# Patient Record
Sex: Female | Born: 1940
Health system: Southern US, Community
[De-identification: ages and names within clinical notes are randomized; demographics above are authoritative.]

## PROBLEM LIST (undated history)

## (undated) DIAGNOSIS — I4892 Unspecified atrial flutter: Secondary | ICD-10-CM

## (undated) DIAGNOSIS — I10 Essential (primary) hypertension: Secondary | ICD-10-CM

## (undated) DIAGNOSIS — I509 Heart failure, unspecified: Secondary | ICD-10-CM

## (undated) DIAGNOSIS — E785 Hyperlipidemia, unspecified: Secondary | ICD-10-CM

## (undated) DIAGNOSIS — I2699 Other pulmonary embolism without acute cor pulmonale: Secondary | ICD-10-CM

## (undated) DIAGNOSIS — I5020 Unspecified systolic (congestive) heart failure: Secondary | ICD-10-CM

## (undated) DIAGNOSIS — I2510 Atherosclerotic heart disease of native coronary artery without angina pectoris: Secondary | ICD-10-CM

## (undated) DIAGNOSIS — I219 Acute myocardial infarction, unspecified: Secondary | ICD-10-CM

## (undated) DIAGNOSIS — N183 Chronic kidney disease, stage 3 unspecified: Secondary | ICD-10-CM

## (undated) DIAGNOSIS — I350 Nonrheumatic aortic (valve) stenosis: Secondary | ICD-10-CM

## (undated) DIAGNOSIS — E119 Type 2 diabetes mellitus without complications: Secondary | ICD-10-CM

## (undated) DIAGNOSIS — I5040 Unspecified combined systolic (congestive) and diastolic (congestive) heart failure: Secondary | ICD-10-CM

## (undated) DIAGNOSIS — M1990 Unspecified osteoarthritis, unspecified site: Secondary | ICD-10-CM

## (undated) DIAGNOSIS — I779 Disorder of arteries and arterioles, unspecified: Secondary | ICD-10-CM

## (undated) DIAGNOSIS — I639 Cerebral infarction, unspecified: Secondary | ICD-10-CM

## (undated) DIAGNOSIS — R413 Other amnesia: Secondary | ICD-10-CM

## (undated) DIAGNOSIS — F0150 Vascular dementia without behavioral disturbance: Secondary | ICD-10-CM

## (undated) DIAGNOSIS — I255 Ischemic cardiomyopathy: Secondary | ICD-10-CM

## (undated) HISTORY — DX: Other amnesia: R41.3

## (undated) HISTORY — DX: Essential (primary) hypertension: I10

## (undated) HISTORY — PX: HIP SURGERY: SHX245

## (undated) HISTORY — DX: Other pulmonary embolism without acute cor pulmonale: I26.99

## (undated) HISTORY — DX: Atherosclerotic heart disease of native coronary artery without angina pectoris: I25.10

## (undated) HISTORY — DX: Unspecified atrial flutter: I48.92

## (undated) HISTORY — PX: CHOLECYSTECTOMY: SHX55

## (undated) HISTORY — DX: Type 2 diabetes mellitus without complications: E11.9

## (undated) HISTORY — DX: Hyperlipidemia, unspecified: E78.5

## (undated) HISTORY — DX: Ischemic cardiomyopathy: I25.5

## (undated) HISTORY — DX: Unspecified combined systolic (congestive) and diastolic (congestive) heart failure: I50.40

---

## 2001-08-16 ENCOUNTER — Emergency Department (HOSPITAL_COMMUNITY): Admission: EM | Admit: 2001-08-16 | Discharge: 2001-08-16 | Payer: Self-pay | Admitting: Emergency Medicine

## 2001-08-16 ENCOUNTER — Encounter: Payer: Self-pay | Admitting: Emergency Medicine

## 2001-10-21 ENCOUNTER — Encounter: Payer: Self-pay | Admitting: General Surgery

## 2001-10-21 ENCOUNTER — Encounter: Payer: Self-pay | Admitting: *Deleted

## 2001-10-22 ENCOUNTER — Inpatient Hospital Stay (HOSPITAL_COMMUNITY): Admission: EM | Admit: 2001-10-22 | Discharge: 2001-10-24 | Payer: Self-pay | Admitting: *Deleted

## 2005-05-20 ENCOUNTER — Inpatient Hospital Stay (HOSPITAL_COMMUNITY): Admission: AD | Admit: 2005-05-20 | Discharge: 2005-05-27 | Payer: Self-pay | Admitting: Interventional Cardiology

## 2005-05-20 ENCOUNTER — Encounter: Payer: Self-pay | Admitting: *Deleted

## 2005-05-22 ENCOUNTER — Encounter (INDEPENDENT_AMBULATORY_CARE_PROVIDER_SITE_OTHER): Payer: Self-pay | Admitting: Cardiology

## 2005-06-18 ENCOUNTER — Encounter (HOSPITAL_COMMUNITY): Admission: RE | Admit: 2005-06-18 | Discharge: 2005-07-12 | Payer: Self-pay | Admitting: Interventional Cardiology

## 2005-07-15 ENCOUNTER — Encounter (HOSPITAL_COMMUNITY): Admission: RE | Admit: 2005-07-15 | Discharge: 2005-08-14 | Payer: Self-pay | Admitting: Interventional Cardiology

## 2005-08-16 ENCOUNTER — Encounter (HOSPITAL_COMMUNITY): Admission: RE | Admit: 2005-08-16 | Discharge: 2005-09-15 | Payer: Self-pay | Admitting: Interventional Cardiology

## 2005-09-16 ENCOUNTER — Encounter (HOSPITAL_COMMUNITY): Admission: RE | Admit: 2005-09-16 | Discharge: 2005-09-25 | Payer: Self-pay | Admitting: Interventional Cardiology

## 2005-11-08 ENCOUNTER — Ambulatory Visit (HOSPITAL_COMMUNITY): Admission: RE | Admit: 2005-11-08 | Discharge: 2005-11-08 | Payer: Self-pay | Admitting: Pulmonary Disease

## 2008-02-18 ENCOUNTER — Encounter: Admission: RE | Admit: 2008-02-18 | Discharge: 2008-02-18 | Payer: Self-pay | Admitting: Interventional Cardiology

## 2008-02-19 ENCOUNTER — Ambulatory Visit (HOSPITAL_COMMUNITY): Admission: RE | Admit: 2008-02-19 | Discharge: 2008-02-19 | Payer: Self-pay | Admitting: Pulmonary Disease

## 2008-02-23 ENCOUNTER — Inpatient Hospital Stay (HOSPITAL_BASED_OUTPATIENT_CLINIC_OR_DEPARTMENT_OTHER): Admission: RE | Admit: 2008-02-23 | Discharge: 2008-02-23 | Payer: Self-pay | Admitting: Interventional Cardiology

## 2008-02-29 ENCOUNTER — Inpatient Hospital Stay (HOSPITAL_COMMUNITY): Admission: RE | Admit: 2008-02-29 | Discharge: 2008-03-01 | Payer: Self-pay | Admitting: Interventional Cardiology

## 2008-03-17 ENCOUNTER — Encounter (HOSPITAL_COMMUNITY): Admission: RE | Admit: 2008-03-17 | Discharge: 2008-04-16 | Payer: Self-pay | Admitting: Interventional Cardiology

## 2008-04-18 ENCOUNTER — Encounter (HOSPITAL_COMMUNITY): Admission: RE | Admit: 2008-04-18 | Discharge: 2008-05-18 | Payer: Self-pay | Admitting: Interventional Cardiology

## 2008-05-20 ENCOUNTER — Encounter (HOSPITAL_COMMUNITY): Admission: RE | Admit: 2008-05-20 | Discharge: 2008-06-19 | Payer: Self-pay | Admitting: Interventional Cardiology

## 2008-06-22 ENCOUNTER — Encounter (HOSPITAL_COMMUNITY): Admission: RE | Admit: 2008-06-22 | Discharge: 2008-07-11 | Payer: Self-pay | Admitting: Interventional Cardiology

## 2008-08-13 ENCOUNTER — Inpatient Hospital Stay (HOSPITAL_COMMUNITY): Admission: EM | Admit: 2008-08-13 | Discharge: 2008-08-27 | Payer: Self-pay | Admitting: Emergency Medicine

## 2008-08-14 DIAGNOSIS — I2699 Other pulmonary embolism without acute cor pulmonale: Secondary | ICD-10-CM

## 2008-08-14 HISTORY — DX: Other pulmonary embolism without acute cor pulmonale: I26.99

## 2008-08-15 ENCOUNTER — Ambulatory Visit: Payer: Self-pay | Admitting: Gastroenterology

## 2008-08-16 ENCOUNTER — Encounter: Payer: Self-pay | Admitting: Gastroenterology

## 2008-08-16 ENCOUNTER — Ambulatory Visit: Payer: Self-pay | Admitting: Gastroenterology

## 2008-08-17 ENCOUNTER — Ambulatory Visit: Payer: Self-pay | Admitting: Gastroenterology

## 2008-08-18 ENCOUNTER — Ambulatory Visit: Payer: Self-pay | Admitting: Internal Medicine

## 2008-08-22 ENCOUNTER — Ambulatory Visit: Payer: Self-pay | Admitting: Internal Medicine

## 2008-09-27 ENCOUNTER — Ambulatory Visit: Payer: Self-pay | Admitting: Gastroenterology

## 2009-03-01 ENCOUNTER — Ambulatory Visit: Payer: Self-pay | Admitting: Orthopedic Surgery

## 2009-03-01 DIAGNOSIS — M1610 Unilateral primary osteoarthritis, unspecified hip: Secondary | ICD-10-CM | POA: Insufficient documentation

## 2009-03-01 DIAGNOSIS — M169 Osteoarthritis of hip, unspecified: Secondary | ICD-10-CM | POA: Insufficient documentation

## 2009-03-01 DIAGNOSIS — M25559 Pain in unspecified hip: Secondary | ICD-10-CM | POA: Insufficient documentation

## 2010-04-09 ENCOUNTER — Emergency Department (HOSPITAL_COMMUNITY): Admission: EM | Admit: 2010-04-09 | Discharge: 2010-04-09 | Payer: Self-pay | Admitting: Emergency Medicine

## 2010-04-12 ENCOUNTER — Ambulatory Visit: Payer: Self-pay | Admitting: Orthopedic Surgery

## 2010-04-12 DIAGNOSIS — S42213A Unspecified displaced fracture of surgical neck of unspecified humerus, initial encounter for closed fracture: Secondary | ICD-10-CM | POA: Insufficient documentation

## 2010-04-26 ENCOUNTER — Ambulatory Visit: Payer: Self-pay | Admitting: Orthopedic Surgery

## 2010-04-30 ENCOUNTER — Encounter (HOSPITAL_COMMUNITY): Admission: RE | Admit: 2010-04-30 | Discharge: 2010-05-30 | Payer: Self-pay | Admitting: Orthopedic Surgery

## 2010-05-28 ENCOUNTER — Ambulatory Visit: Payer: Self-pay | Admitting: Orthopedic Surgery

## 2010-05-29 ENCOUNTER — Telehealth: Payer: Self-pay | Admitting: Orthopedic Surgery

## 2010-06-01 ENCOUNTER — Encounter (HOSPITAL_COMMUNITY): Admission: RE | Admit: 2010-06-01 | Discharge: 2010-07-01 | Payer: Self-pay | Admitting: Orthopedic Surgery

## 2010-06-27 ENCOUNTER — Encounter: Payer: Self-pay | Admitting: Orthopedic Surgery

## 2010-06-28 ENCOUNTER — Ambulatory Visit: Payer: Self-pay | Admitting: Orthopedic Surgery

## 2010-07-02 ENCOUNTER — Encounter (HOSPITAL_COMMUNITY): Admission: RE | Admit: 2010-07-02 | Discharge: 2010-07-13 | Payer: Self-pay | Admitting: Orthopedic Surgery

## 2010-07-06 ENCOUNTER — Encounter: Payer: Self-pay | Admitting: Orthopedic Surgery

## 2010-07-16 ENCOUNTER — Encounter (HOSPITAL_COMMUNITY)
Admission: RE | Admit: 2010-07-16 | Discharge: 2010-08-15 | Payer: Self-pay | Source: Home / Self Care | Admitting: Orthopedic Surgery

## 2010-07-26 ENCOUNTER — Encounter: Payer: Self-pay | Admitting: Orthopedic Surgery

## 2010-08-01 ENCOUNTER — Ambulatory Visit: Payer: Self-pay | Admitting: Orthopedic Surgery

## 2010-08-13 ENCOUNTER — Ambulatory Visit (HOSPITAL_COMMUNITY): Admission: RE | Admit: 2010-08-13 | Discharge: 2010-08-13 | Payer: Self-pay | Admitting: Pulmonary Disease

## 2010-08-17 ENCOUNTER — Encounter (HOSPITAL_COMMUNITY)
Admission: RE | Admit: 2010-08-17 | Discharge: 2010-09-16 | Payer: Self-pay | Source: Home / Self Care | Admitting: Orthopedic Surgery

## 2010-08-21 ENCOUNTER — Encounter: Payer: Self-pay | Admitting: Orthopedic Surgery

## 2010-09-12 ENCOUNTER — Ambulatory Visit: Payer: Self-pay | Admitting: Orthopedic Surgery

## 2010-11-13 NOTE — Assessment & Plan Note (Signed)
Summary: 2 WK XR RT SHOULDER/MEDICARE/BSF   Visit Type:  Follow-up Referring Provider:  sp er Primary Provider:  Dr. Luan Pulling  CC:  RECHECK RT SHOULDER.  History of Present Illness: I saw [name] in the office today for an initial visit.  She is a 70 years old woman with the complaint of:  right shoulder fracture.  DOI 04/09/10 fell.  Xrays [facility] 04/09/10 rt shoulder.  Meds: Enalapril, Metoprolol, HCTZ, Amlodipine, Plavix, Lipitor, Glucatrol Actoplus.  Percocet 5 and Reglan 10 given from er, no pain med needed now.  TODAY IS 2 WEEK RECHECK XRAY RIGHT SHOULDER AFTER SLING.  She feels better, in sling today with cane left hand.      Allergies: No Known Drug Allergies  Physical Exam  Additional Exam:  The arm has less swelling   her hand has diminished ROM   the wrist is normal and the thumb is normal       Impression & Recommendations:  Problem # 1:  CLOSED FRACTURE OF SURGICAL NECK OF HUMERUS (ICD-812.01)  AP and lateral RIGHT shoulder x-ray show 1 cm or less translation on the AP view no angulation or rotatory deformity  Lateral looks perfect  Recommend continue sling start physical therapy return 4 weeks for x-rays  Orders: Physical Therapy Referral (PT) Post-Op Check [id number]) Shoulder x-ray,  minimum 2 views [id number])  Patient Instructions: 1)  xrays look good, shoulder is healing 2)  stay in sling 3)  start rehab at [facility] for shoulder elbow wrist and hand 4)  come back in 4 weeks recheck and xray right shoulder fracture

## 2010-11-13 NOTE — Miscellaneous (Signed)
Summary: OT progress note  OT progress note   Imported By: Ruffin Pyo 07/26/2010 15:44:46  _____________________________________________________________________  External Attachment:    Type:   Image     Comment:   External Document

## 2010-11-13 NOTE — Letter (Signed)
Summary: History form  History form   Imported By: Ruffin Pyo 03/09/2009 14:59:33  _____________________________________________________________________  External Attachment:    Type:   Image     Comment:   External Document

## 2010-11-13 NOTE — Assessment & Plan Note (Signed)
Summary: RT HIP PAIN/NEEDS XRAY/HAD HIP SURG DR H 2000/MEDICARE,UN AM/CAF   Vital Signs:  Patient profile:   70 year old female Weight:      208 pounds Pulse (ortho):   70 / minute Resp:     16 per minute  Visit Type:  Initial    CC:  right hip pain.  History of Present Illness: The patient had a fracture of the RIGHT hip and had open treatment internal fixation for peritrochanteric fracture.  At the time she was noted to have arthritis of the hip.  She was told this may progress and get worse.  Over the last week she had increased pressure and pain in the RIGHT hip which has since resolved but she thought she better get it checked out anyway.  There is pain with ambulation.  No radiation.  Pain is primarily over the lateral hip.  Current Meds: Glucotrol Vasotec, Lopressor, Amlodipine, diuretic, cholesterol med.     Allergies (verified): No Known Drug Allergies  Past History:  Past Medical History:    htn    diabetes    cholesterol  Past Surgical History:    right hip pain    gallbladder    hernia repair    stent for heart  Family History:    Family History of Diabetes    Family History Coronary Heart Disease female < 36    Family History of Arthritis  Social History:    Patient is married.     retired  Review of Systems Cardiac :  Complains of heart attack and blood clots. Endo:  Complains of diabetes.  Physical Exam  Additional Exam:  She has what we would probably endomorphic body habitus, no other deformity, she does have a RIGHT shorter than LEFT leg length discrepancy mild.  She ambulates with a mild limp and waddling gait.  She is normal findings for cardiovascular function in both lower extremities, skin is warm dry and intact incision is well-healed.  Reflexes are normal sensation is normal.  She's awake alert and oriented x3 mood and affect are normal.  Musculoskeletal exam inspection gait as stated  No tenderness in either hip.  Range  of motion decreased internal rotation both hips.  Pain with internal rotation RIGHT hip.  Internal rotation only about 10 bilaterally.  External rotation about 45.  Motor exam both lower extremities normal.  Stability both hips normal.     Impression & Recommendations:  Problem # 1:  HIP PAIN (ICD-719.45) Assessment Comment Only  Orders: New Patient Level III [id number]) Pelvis x-ray complete, minimum 3 views [id number]) Hip x-ray unilateral complete, minimum 2 views [id number]) a long plate and screw construct with cerclage wires noted RIGHT hip and femur.  Also notable is a obliterated RIGHT hip joint.  Impression: Status post treatment internal fixation RIGHT hip with long plate and screw for a pertrochanteric fracture with good healing and alignment of the fracture but osteoarthritis of the RIGHT hip severe.  Problem # 2:  HIP, ARTHRITIS, DEGEN./OSTEO (ICD-715.95) We counseled the patient on the following she does have arthritis of the hip.  This will be accompanied proceeded to take this down and replaced the joint.  She'll need a long stem prosthesis.  Therefore since her pain is manageable we will avoid surgery but followup once a year for x-rays  Patient Instructions: 1)  Please schedule a follow-up appointment in 1 year. 2)  xray 1/year

## 2010-11-13 NOTE — Progress Notes (Signed)
Summary: patieng question about driving  Phone Note Call from Patient   Caller: Patient Summary of Call: Patient called, as said forgot to check yesterday, about when it is okay to drive?  Home ph # is [phone].  Her next appt is 06/28/10. Initial call taken by: Ihor Austin,  May 29, 2010 2:44 PM  Follow-up for Phone Call        YES SHORT DISTANCES  Follow-up by: Arther Abbott MD,  May 29, 2010 2:44 PM  Additional Follow-up for Phone Call Additional follow up Details #1::        Advised patient. Additional Follow-up by: Ihor Austin,  May 29, 2010 3:24 PM

## 2010-11-13 NOTE — Miscellaneous (Signed)
Summary: OT clinical evaluation  OT clinical evaluation   Imported By: Ruffin Pyo 07/09/2010 08:50:59  _____________________________________________________________________  External Attachment:    Type:   Image     Comment:   External Document

## 2010-11-13 NOTE — Assessment & Plan Note (Signed)
Summary: 6 WK RECK AFTER PT/MCR/BSF   Visit Type:  Follow-up Referring Omarrion Carmer:  sp er Primary Brently Voorhis:  Dr. Luan Pulling  CC:  recheck shoulder.  History of Present Illness: 70 year old female proximal humerus fracture June of this year treated with physical therapy which she has recently been released.  She's currently on a home exercise program with some occasional achiness in the biceps tendon area.  She is doing well.  Meds: No pain med needed. Enalapril, Metoprolol, HCTZ, Amlodipine, Plavix, Lipitor, Glucatrol Actoplus.  she has forward  elevation of 140 with external rotation of approximately 45  She has been released.       Allergies: No Known Drug Allergies   Impression & Recommendations:  Problem # 1:  CLOSED FRACTURE OF SURGICAL NECK OF HUMERUS (ICD-812.01) Assessment Improved  Orders: Est. Patient Level II [id number])  Patient Instructions: 1)  Please schedule a follow-up appointment as needed.   Orders Added: 1)  Est. Patient Level II [id number]

## 2010-11-13 NOTE — Assessment & Plan Note (Signed)
Summary: 1 M RE-CK RT SHOULDER/MCR/CAF   Visit Type:  Follow-up Referring Jasai Sorg:  sp er Primary Kimberlea Schlag:  Dr. Luan Pulling  CC:  right shoulder fracture.  History of Present Illness: I saw [name] in the office today for a 1 month followup visit.  She is a 70 years old woman with the complaint of:  right shoulder fracture.  Proximal humeral fracture.  DOI 04/09/10 fell.  Xrays [facility] 04/09/10 rt shoulder.  Meds: PERCOCET 5MG  Enalapril, Metoprolol, HCTZ, Amlodipine, Plavix, Lipitor, Glucatrol Actoplus.  She is still in therapy. Things are going well.  She has passive range of motion of 100 of forward elevation with active 120 she can internally rotate actively to her back pocket and has 50 of passive and active external rotation with her arm at the side  She should continue with her physical therapy and all see her in 6 weeks  Allergies: No Known Drug Allergies   Medications Added to Medication List This Visit: 1)  Percocet 5-325 Mg Tabs (Oxycodone-acetaminophen) .Marland Kitchen.. 1 q 4prn  Other Orders: Post-Op Check [id number])  Patient Instructions: 1)  6 WEEKS RETURN  2)  FINISH pt  Prescriptions: PERCOCET 5-325 MG TABS (OXYCODONE-ACETAMINOPHEN) 1 Q 4PRN  #30 x 0   Entered and Authorized by:   Arther Abbott MD   Signed by:   Arther Abbott MD on 08/01/2010   Method used:   Print then Give to Patient   RxID:   [id number]    Orders Added: 1)  Post-Op Check [id number]

## 2010-11-13 NOTE — Miscellaneous (Signed)
Summary: OT Progress note  OT Progress note   Imported By: Ruffin Pyo 05/30/2010 08:10:50  _____________________________________________________________________  External Attachment:    Type:   Image     Comment:   External Document

## 2010-11-13 NOTE — Miscellaneous (Signed)
Summary: OT Progress Note  OT Progress Note   Imported By: Ruffin Pyo 06/27/2010 10:04:11  _____________________________________________________________________  External Attachment:    Type:   Image     Comment:   External Document

## 2010-11-13 NOTE — Miscellaneous (Signed)
Summary: OT progress note  OT progress note   Imported By: Ruffin Pyo 08/21/2010 17:01:09  _____________________________________________________________________  External Attachment:    Type:   Image     Comment:   External Document

## 2010-11-13 NOTE — Assessment & Plan Note (Signed)
Summary: 4 WK RE-CK/XRAY RT SHOULDER/MEDICARE/CAF   Visit Type:  Follow-up Referring Provider:  sp er Primary Provider:  Dr. Luan Pulling  CC:  right shoulder fracture.  History of Present Illness: I saw [name] in the office today for a 4 week  followup visit.  She is a 70 years old woman with the complaint of:  right shoulder.  Xrays today for a recheck.  right shoulder fracture.  DOI 04/09/10 fell.  Xrays [facility] 04/09/10 rt shoulder.  Meds: Enalapril, Metoprolol, HCTZ, Amlodipine, Plavix, Lipitor, Glucatrol Actoplus.  X-rays today show the RIGHT shoulder 2 views show that the fracture is healing nicely in good position  Patient physical therapy notes indicate progressive increasing motion  Recommendation continue physical therapy return in one month check range of motion    Allergies: No Known Drug Allergies   Impression & Recommendations:  Problem # 1:  CLOSED FRACTURE OF SURGICAL NECK OF HUMERUS (ICD-812.01) Assessment Improved  AP lateral RIGHT shoulder fracture proximal humerus nondisplaced good callus forming  Orders: Post-Op Check [id number]) Shoulder x-ray,  minimum 2 views [id number])  Patient Instructions: 1)  Please schedule a follow-up appointment in 1 month.

## 2010-11-13 NOTE — Assessment & Plan Note (Signed)
Summary: 1 MO RECK RT SHOULDER/MCR/BSF   Visit Type:  Follow-up Referring Provider:  sp er Primary Provider:  Dr. Luan Pulling  CC:  recheck right shoulder.  History of Present Illness: proximal humerus fracture RIGHT shoulder  70 year old female now 10 weeks post injury status post physical therapy/ proceeding well in physical therapy DOI 04/09/10 fell.  Xrays [facility] 04/09/10 rt shoulder.  Meds: Enalapril, Metoprolol, HCTZ, Amlodipine, Plavix, Lipitor, Glucatrol Actoplus.  she is not requiring any pain medicine at this time she still has a lot of stiffness and is worried about her internal rotation  Her forward elevation is approximately 80, abduction 70, internal rotation to the hip pocket and external rotation 45 no pain at the fracture site  She has normal neurovascular function  Physical therapy notes indicate improving range of motion  Recommend continue range of motion followup one month    Allergies: No Known Drug Allergies   Impression & Recommendations:  Problem # 1:  CLOSED FRACTURE OF SURGICAL NECK OF HUMERUS (ICD-812.01)  Orders: Post-Op Check [id number])  Patient Instructions: 1)  Please schedule a follow-up appointment in 1 month.

## 2010-11-13 NOTE — Letter (Signed)
Summary: History form  History form   Imported By: Ruffin Pyo 04/20/2010 07:54:47  _____________________________________________________________________  External Attachment:    Type:   Image     Comment:   External Document

## 2010-11-13 NOTE — Assessment & Plan Note (Signed)
Summary: rt shoulder fx/[facility] xrays/per dr Elvis Coil   Visit Type:  new problem Referring Provider:  sp er Primary Provider:  Dr. Luan Pulling  CC:  right arm pain.  History of Present Illness: I saw [name] in the office today for an initial visit.  She is a 70 years old woman with the complaint of:  right shoulder fracture.  DOI 04/09/10 fell.  Xrays [facility] 04/09/10 rt shoulder.  Meds: Enalapril, Metoprolol, HCTZ, Amlodipine, Plavix, Lipitor, Glucatrol Actoplus.  Percocet 5 and Reglan 10 given from er.  In sling today.   c/o fairly severe, aching, throbbing non radiating right shoulder pain    Allergies (verified): No Known Drug Allergies  Past History:  Past Medical History: Last updated: 03/01/2009 htn diabetes cholesterol  Family History: Last updated: 03/01/2009 Family History of Diabetes Family History Coronary Heart Disease female < 61 Family History of Arthritis  Social History: Last updated: 04/12/2010 Patient is married.  retired no smoking no alcohol  1 x day caffeine 14 yr ed.  Past Surgical History: right hip  gallbladder hernia repair stent for heart  Family History: Reviewed history from 03/01/2009 and no changes required. Family History of Diabetes Family History Coronary Heart Disease female < 55 Family History of Arthritis  Social History: Reviewed history from 03/01/2009 and no changes required. Patient is married.  retired no smoking no alcohol  1 x day caffeine 14 yr ed.  Review of Systems Neurologic:  Complains of numbness; denies tingling, unsteady gait, dizziness, tremors, and seizure. Musculoskeletal:  Complains of swelling; denies joint pain, instability, stiffness, redness, heat, and muscle pain.  The review of systems is negative for Constitutional, Cardiovascular, Respiratory, Gastrointestinal, Genitourinary, Endocrine, Psychiatric, Skin, HEENT, Immunology, and Hemoatologic.  Physical Exam  Additional Exam:  GEN:  normal   CDV: normal pulse radial ulnar right hand and wrist, temp normal    LYMPH: normal non palpable cervical lymph nodes   SKIN: normal with subQ ecchymosis    Neuro: normal axillary nerve sensation, she is awake and alert , mood pleasant    MSK: ambulates with a cane and a slight limp   RT shoulder in sling  tender over the proximal deltoid swollen; normal ROM in the hand and wrist  strength weak grip  wrist stable    Impression & Recommendations:  Problem # 1:  CLOSED FRACTURE OF SURGICAL NECK OF HUMERUS (ICD-812.01)  The x-rays were done at [facility]. The report and the films have been reviewed. lateral view not readable ! repeated: non displaced prox hum fracture   REC SH IMMOBILIZER   Orders: Est. Patient Level IV [id number]) Humeral Neck Fx [id number]) Shoulder x-ray,  minimum 2 views [id number])  Patient Instructions: 1)  Please schedule a follow-up appointment in 2 weeks. 2)  X-RAYS SHOULDER

## 2011-02-26 NOTE — Group Therapy Note (Signed)
[name], [name]              ACCOUNT NO.:  [id number]   MEDICAL RECORD NO.:  [id number]          PATIENT TYPE:  INP   LOCATION:  A316                          FACILITY:  [facility]   PHYSICIAN:  Edward L. Luan Pulling, M.D.DATE OF BIRTH:  13-May-1941   DATE OF PROCEDURE:  DATE OF DISCHARGE:                                 PROGRESS NOTE   PROBLEMS:  1. Ischemic colitis.  2. Salmonella.  Positive stool for Salmonella.  3. Diabetes.  4. Coronary artery occlusive disease.  5. Hypertension.  6. Hypokalemia.   SUBJECTIVE:  [name] says she is doing better.  She is still having  diarrhea, however.  I am not really certain how long this would be  expected to last under the circumstances, so I asked Dr. Stann Mainland, the GI  physician and see what would be a reasonable expectation for how long  her diarrhea should last.  Otherwise, she is getting along better.  She  says she feels a little better.  She did have potassium replacement.   PHYSICAL EXAMINATION:  CHEST:  Clear.  ABDOMEN:  Tender mildly.  Bowel sounds are hyperactive.   Her potassium is pending.   ASSESSMENT:  Then she still has diarrhea.  I do not think I can send her  home quite yet.   PLAN:  Then is to continue with the treatments.  Check with Dr. Stann Mainland  and follow.      Edward L. Luan Pulling, M.D.  Electronically Signed     ELH/MEDQ  D:  08/18/2008  T:  08/18/2008  Job:  [id number]

## 2011-02-26 NOTE — Group Therapy Note (Signed)
[name], [name]              ACCOUNT NO.:  [id number]   MEDICAL RECORD NO.:  [id number]          PATIENT TYPE:  INP   LOCATION:  A316                          FACILITY:  [facility]   PHYSICIAN:  Edward L. Luan Pulling, M.D.DATE OF BIRTH:  1941/02/26   DATE OF PROCEDURE:  08/19/2008  DATE OF DISCHARGE:                                 PROGRESS NOTE   HISTORY:  [name] has continued to have diarrhea.  She had a very  loose and quite explosive bowel movement yesterday and had soiled her  clothing, the floor, and the socks.  She is still very uncomfortable.  Otherwise, she is about the same.  She remains with a low-grade fever,  99.4 yesterday, 99 now; pulse is 91; respirations 18; blood pressure  137/74; and O2 sats 93% on room air.  Her chest is clear.  Her abdomen  is actually fairly soft.  I have discussed her situation with the GI  team.  She has been evaluated by the Health Department because of her  Salmonella.  It is not at all clear where the source of her Salmonella  is from.  Her blood sugars remain pretty well controlled, and she has  not had chest discomfort.   ASSESSMENT:  She has a complicated situation with probably Salmonella  diarrhea, which then caused dehydration, what appears to be acute renal  failure, then an ischemic bowel, and she continues to have substantial  diarrhea.  We discussed going home.  She said she just does not feel  well enough, and frankly, she does not look well enough either.  She is  diabetic.  Her blood sugars are pretty well controlled.  She has  coronary artery occlusive disease but that is stable.  I am going to  discontinue the monitor.  I am going to continue with her other  treatments, and her potassium is still slightly low.  She is receiving  potassium replacement already.      Edward L. Luan Pulling, M.D.  Electronically Signed     ELH/MEDQ  D:  08/19/2008  T:  08/19/2008  Job:  [id number]

## 2011-02-26 NOTE — Consult Note (Signed)
NAMEXOPHIA, ANGELI              ACCOUNT NO.:  [id number]   MEDICAL RECORD NO.:  [id number]          PATIENT TYPE:  INP   LOCATION:  A316                          FACILITY:  [facility]   PHYSICIAN:  Caro Hight, M.D.      DATE OF BIRTH:  Nov 27, 1940   DATE OF CONSULTATION:  DATE OF DISCHARGE:                                 CONSULTATION   REQUESTING PHYSICIAN:  Dr. Luan Pulling.   REASON FOR CONSULTATION:  Bloody diarrhea.   HPI:  Ms. Brar is a 70 year old Caucasian female who developed acute  onset of febrile illness about 5 days ago.  She tells me her temp was  103.  She had severe myalgias.  She also had nausea but without  vomiting.  The following day she developed diarrhea.  She had about 10  or more loose stools per day.  About 3 days ago, she noticed small to  moderate amounts of bright red blood in the diarrhea with wiping.  She  was started on Tamiflu just prior to coming into the hospital.  She  denies any headache, upper respiratory symptoms, dizziness.  Denies any  heartburn or ingestion.  Her appetite had been good prior to this.  Her  weight had remained stable.  She is on Plavix and aspirin with history  of coronary artery disease and last stent placement in June of 2009.  She has been placed on Flagyl 500 mg t.i.d.  She has C. diff and  lactoferrin pending.   PAST MEDICAL AND SURGICAL HISTORY:  1. Coronary artery disease status post MI and 2 stents placed in June      of 2009.  2. She has never had a colonoscopy.  3. She has non-insulin dependent diabetes mellitus.  4. Hypertension.  5. Right hip surgery in 2000.  6. Status post cholecystectomy 4 years ago.  7. Ventral hernia repair 6 years ago.  8. Campylobacter.   MEDICATIONS PRIOR TO ADMISSION:  Include:  1. Plavix 75 mg daily.  2. Aspirin 325 mg daily.  3. Metoprolol 50 mg b.i.d.  4. Hydrochlorothiazide 25 mg daily.  5. Zocor 40 mg daily.  6. Glipizide 10 mg b.i.d.  7. Enalapril 10 mg b.i.d.  8. Amlodipine  5 mg daily.   ALLERGIES:  NO KNOWN DRUG ALLERGIES.   FAMILY HISTORY:  Positive for father diagnosed in his 55s with colon  cancer.  He died at 50 secondary to coronary disease.  Mother deceased  at 66 secondary to CHF.  She has 1 healthy sister.   SOCIAL HISTORY:  She has a remote history of tobacco use, smoked only  for about 10 years.  Denies any alcohol or drug use.  She has been  married for 45 years.  She has 3 grown children, ages 63, 57, and 65,  all of them are healthy.   REVIEW OF SYSTEMS:  See HPI.  CONSTITUTIONAL:  She does have fatigue.  EXTREMITIES:  She has lower extremity edema.  GU:  She denies any  dysuria, hematuria, or increased urinary frequency.  Otherwise, see HPI,  otherwise negative review of systems.  PHYSICAL EXAM:  VITAL SIGNS:  Temp 98.6.  Pulse 88.  Respirations 18.  Blood pressure 122/61.  GENERAL:  Ms. Zelenak is an obese Caucasian female who is alert and  cooperative, no acute distress.HEENT:  Sclerae are clear, nonicteric.  Conjunctivae pink.  Oropharynx pink and moist without any lesions.  NECK:  Supple without any mass or thyromegaly.  CHEST:  Heart, regular  rate and rhythm.  Normal S1 and S2 without any murmurs, clicks, rubs, or  gallops.  LUNGS:  Clear to auscultation bilaterally.  ABDOMEN:  Protuberant with positive bowel sounds x4.  No bruits auscultated.  Soft, nontender, nondistended, without palpable mass or  hepatosplenomegaly.  No rebound, tenderness, or guarding.  Exam is  limited given patient's body habitus.  EXTREMITIES:  Without clubbing.  She did have trace upper and lower  extremity edema.   LABORATORY STUDIES:  WBC is 8.4, hemoglobin 12.1, hematocrit 35.6, and  platelet 203.  Calcium 8.2, sodium 135, potassium 3.5, chloride 107, CO2  of 21, BUN 27, creatinine 1.57, and glucose 136.  LFTs from August 13, 2008, were normal except albumin 3.  Fecal occult blood was positive.   IMPRESSION:  Ms. Damour is a 31-year Caucasian  female with acute onset  of febrile illness, myalgias, nausea, and diarrhea which later became  bloody diarrhea.  She is going to require further evaluation to rule out  inflammatory bowel disease, colorectal carcinoma, and it is possible  that this could be acute viral or bacterial illness with benign  anorectal bleeding.  She has been empirically treated with Flagyl 500 mg  t.i.d.  She will remain on Plavix given the fact that she has drug-  eluting stent placed June 2009.   PLAN:  Colonoscopy with Dr. Stann Mainland in the near future.  I have discussed  procedure including risks and benefits which include but are not limited  to bleeding,  infection, perforation, or drug reaction.  She agrees and  planned consent will be obtained.  She will be on clear liquids n.p.o.  after midnight except for medications.  MiraLax 17 g in  8 ounces of clear liquids or water every hour until midnight.  Dulcolax  10 mg p.o. now, repeat in 2 hours.  She will have a tap water enema at 7  a.m. and 8 a.m. tomorrow prior to procedure.   Thank you, Dr. Luan Pulling, for allowing [place] to participate in the care of  Ms. Grall.      Vickey Huger, N.P.      Caro Hight, M.D.  Electronically Signed    KJ/MEDQ  D:  08/15/2008  T:  08/15/2008  Job:  [id number]   cc:   Percell Miller L. Luan Pulling, M.D.  Fax: [phone]

## 2011-02-26 NOTE — Group Therapy Note (Signed)
[name], [name]              ACCOUNT NO.:  [id number]   MEDICAL RECORD NO.:  [id number]          PATIENT TYPE:  INP   LOCATION:  A316                          FACILITY:  [facility]   PHYSICIAN:  Edward L. Luan Pulling, M.D.DATE OF BIRTH:  1940-10-17   DATE OF PROCEDURE:  DATE OF DISCHARGE:                                 PROGRESS NOTE   [name] continues on Lovenox and Coumadin.  She said she is feeling  better.  She feels like she has better response for oxygen, is  concerned, she had an O2 sat of 93% on 3 L, she went down to 2 L, she  was 93%.  She is going to try 1 L and will try on room air and see if  she does need oxygen at home.  Her diarrhea has almost stopped.  She is  not having any other new complaints.  She said she is not very short of  breath.  She does not have any pain.   Her exam shows temperature is 99.1, pulse 81, respirations 18, blood  pressure 142/66, and O2 sats as mentioned.  Her chest is pretty clear.  Her heart is regular.   ASSESSMENT:  She has abdominal discomfort.  She has Salmonella in the  stool.  She has had significant diarrhea from that, that is better.  She  has pulmonary embolism.  She is being treated with Lovenox.  We are  going to try to get her home tomorrow and bridge her at home.  She is  going to learn how to give herself shot today.  We are working on [employer].      Edward L. Luan Pulling, M.D.  Electronically Signed     ELH/MEDQ  D:  08/26/2008  T:  08/26/2008  Job:  [id number]

## 2011-02-26 NOTE — Op Note (Signed)
[name], WIGHTMAN              ACCOUNT NO.:  [id number]   MEDICAL RECORD NO.:  [id number]          PATIENT TYPE:  INP   LOCATION:  A316                          FACILITY:  [facility]   PHYSICIAN:  Caro Hight, M.D.      DATE OF BIRTH:  [birth year]   DATE OF PROCEDURE:  08/16/2008  DATE OF DISCHARGE:                               OPERATIVE REPORT   REFERRING PHYSICIAN:  Edward L. Luan Pulling, M.D.   PROCEDURE:  Ileocolonoscopy.   INDICATION FOR EXAM:  Ms. Causer is a 70 year old female who is  currently maintained on aspirin and Plavix.  Her last cardiac  catheterization was May 2009.  She had severe mid right coronary  stenosis with in-stent restenosis and a Cypher stent was placed above  the proximal and distal stent margins. She presented with acute onset of  diarrhea.  She reports having a temperature up to 103.  She saw small  amount of blood.  Her stool cultures thus far are negative.  She has a  C. diff toxin that is pending.  She was placed on Flagyl by Dr. Luan Pulling.  She has not had any abdominal imaging.   FINDINGS:  1. Patchy erythema beginning approximately 10 cm from the anal verge      and extending into the descending colon.  No ulcerations were seen.      No pseudomembranes were seen.  She also had a scant amount of      patchy erythema in the transverse colon.  Biopsies were obtained in      the transverse colon, descending and sigmoid colon, and rectum and      sent in separate containers.  2. Frequent descending colon and sigmoid colon diverticula.  3. No polyps, masses or AVMs seen.  4. Normal retroflexed view of the rectum.   DIAGNOSES:  Colitis likely secondary to ischemia.  The differential  diagnosis includes resolving infectious colitis, or inflammatory bowel  disease. Patient most likely had acute gastroeneteritis and became  hypovolemic whihc lead to renal insufficiency and ischemic colitis.   RECOMMENDATIONS:  1. The patient will need further evaluation  of her abdominal      vasculature.  Specifically, the SMA.  Currently, her creatinine is      1.57 and she is not a candidate for IV contrast. Discuess with her      son and wife.  2. May restart the aspirin on 08/19/08.  3. Would reduce her Lopressor to 25 mg p.o. b.i.d..  4. Will await biopsies.  5. Would complete a 10-day course of Flagyl.  6. Continue Plavix.  Montior for hematochezia.  7. Would use Imodium as needed for diarrhea.  8. Low-fat lactose-free diet.  9. Add Flora-Q p.o. daily.  10.Outpatient visit with Caro Hight, M.D. in 6 weeks.   MEDICATIONS:  1. Demerol 75 mg IV.  2. Versed 7 mg IV.   PROCEDURE TECHNIQUE:  Physical exam was performed.  Informed consent was  obtained from the patient after explaining the benefits, risks and  alternatives to the procedure.  The patient was connected to the monitor  and  placed in the left lateral position.  Continuous oxygen was provided  by nasal cannula, IV medicine administered through an indwelling  cannula.  After administration of sedation and rectal exam, the  patient's rectum was intubated and the scope was advanced under direct  visualization to the cecum.  The patient has a extremely tortuous colon.  The scope was removed slowly by carefully examining the color, texture,  anatomy and integrity of the mucosa on the way out.  The patient was  recovered in endoscopy and discharged to the floor in satisfactory  condition.      Caro Hight, M.D.  Electronically Signed     SM/MEDQ  D:  08/16/2008  T:  08/16/2008  Job:  [id number]   cc:   Percell Miller L. Luan Pulling, M.D.  Fax: [phone]

## 2011-02-26 NOTE — Group Therapy Note (Signed)
NAMEGALIANA, [name]              ACCOUNT NO.:  [id number]   MEDICAL RECORD NO.:  [id number]          PATIENT TYPE:  INP   LOCATION:  A316                          FACILITY:  [facility]   PHYSICIAN:  Edward L. Luan Pulling, M.D.DATE OF BIRTH:  [birth year]   DATE OF PROCEDURE:  DATE OF DISCHARGE:                                 PROGRESS NOTE   [name] was admitted with diarrhea.  She had a colonoscopy  yesterday which showed ischemic colitis.  She continues to have  diarrhea.  She says otherwise she is feeling okay.  She is not having  lot of cramping or any other complaints.   PHYSICAL EXAMINATION:  VITAL SIGNS:  Her temperature is 99.1, pulse 80,  respirations 18, and blood pressure 133/57.  Her blood sugars have been  good and well-controlled.  CHEST:  Clear.  HEART:  Regular.  ABDOMEN:  Soft.   ASSESSMENT:  She has ischemic colitis associated with pretty significant  diarrhea.  This morning, her potassium is 2.6, however replacing that.  Her hemoglobin level was slightly low.  I suspect a combination of  multiple factors.  She has coronary artery occlusive disease, which is  stable.  She has not had any chest pain.  She has diabetes, which is  doing well.  She has hypertension well-controlled, so my plan is to add  potassium and continue with her other medications.  I think we simply  have to wait for her diarrhea to slow.      Edward L. Luan Pulling, M.D.  Electronically Signed     ELH/MEDQ  D:  08/17/2008  T:  08/17/2008  Job:  [id number]

## 2011-02-26 NOTE — Cardiovascular Report (Signed)
[name], [name]              ACCOUNT NO.:  [id number]   MEDICAL RECORD NO.:  [id number]          PATIENT TYPE:  OIB   LOCATION:  1962                         FACILITY:  [facility]   PHYSICIAN:  Belva Crome, M.D.   DATE OF BIRTH:  Nov 11, 1940   DATE OF PROCEDURE:  02/23/2008  DATE OF DISCHARGE:  02/23/2008                            CARDIAC CATHETERIZATION   INDICATIONS:  Progressive angina, dyspnea on exertion in this patient  who is status post previous infarction with PCI on the LAD and right  coronary with DES stenting in the mid right coronary and DES stenting in  the mid-LAD on May 24, 2005.  This study is being done because of a  recent Cardiolite study that demonstrated evidence of inferolateral  ischemia.   PROCEDURE PERFORMED:  1. Left heart cath.  2. Selective coronary angio.  3. Left ventriculography.   DESCRIPTION OF PROCEDURE:  After informed consent, a 4-French sheath was  placed in the right femoral artery using the modified Seldinger  technique.  A 4-French A2 multipurpose catheter was used for hemodynamic  recordings, and right coronary angiography.  We also used 4-French #4  left and right Judkins catheters for left and right coronary  angiography.  The patient tolerated the procedure without complications.   RESULTS:  1. Hemodynamic data:      a.     Aortic pressure 126/54.      b.     Left ventricular pressure 129/21.  2. Left ventriculography:  There is inferior wall mild hypokinesis.      EF is 50%.  No mitral regurgitation.  3. Coronary angiography.      a.     Left main coronary:  Left main is widely patent.      b.     Left anterior descending coronary:  The LAD contains heavy       calcification, contains a moderate-sized first diagonal, there is       segmental 90% stenosis above an ostium of the proximal segment.       The LAD contains 40% stenosis just distal to the first septal       perforator and the first diagonal and then a region of  stented mid       LAD is widely patent.  LAD wraps around the left ventricular apex.      c.     Circumflex artery:  Circumflex coronary artery gives origin       to several obtuse marginal branches.  The bifurcating large obtuse       marginal #1 with 90% stenosis proximal to the bifurcation.  The       second obtuse marginal is free of significant obstruction.  This       vessel is basically a branch of first obtuse margin.  The third       obtuse marginal arises distally after the main body of the       circumflex and contains mid 90% diffuse narrowing ending on a       small PDA branch.      d.  Right coronary artery:  The right coronary artery is a       previously stented vessel, gives origin to a large PDA, and       contains 80% stenosis of the proximal stent margin of the vessel,       but there is type 1 focal stenosis in the distal portion of the       previously placed stent.  The distal RCA contains luminal       irregularities but no high-grade obstructions.   CONCLUSION:  1. The patient has severe mid-right coronary stenosis, type 1A Mehran      in-stent restenosis of previously placed Cypher stent at both      proximal and distal stent margins.  2. Severe native disease involving the circumflex branches as      outlined.  3. Widely patent LAD which was previously stented.  The first diagonal      contains high-grade disease, but it is relatively small.  4. Left ventricular dysfunction.  5. The fusion abnormality on the Cardiolite likely corresponds to the      restenosis of the right coronary artery.   PLAN:  Consider re-PCI of the right coronary.  At this point, I do not  believe that the patient should be a surgical candidate because the LAD  is widely patent and now 3 years out from previous DES stenting without  significant obstruction involving the main channel of the LAD.  The  first obtuse marginal and the third obtuse marginal can likely be  stented.  The  right coronary artery stenosis will be restented.  Speak  with the patient concerning PCI.      Belva Crome, M.D.  Electronically Signed     HWS/MEDQ  D:  02/23/2008  T:  02/24/2008  Job:  [id number]   cc:   Percell Miller L. Luan Pulling, M.D.

## 2011-02-26 NOTE — Group Therapy Note (Signed)
[name], [name]              ACCOUNT NO.:  [id number]   MEDICAL RECORD NO.:  [id number]          PATIENT TYPE:  INP   LOCATION:  A316                          FACILITY:  [facility]   PHYSICIAN:  Edward L. Luan Pulling, M.D.DATE OF BIRTH:  10/19/1940   DATE OF PROCEDURE:  08/27/2008  DATE OF DISCHARGE:  08/27/2008                                 PROGRESS NOTE   [name] is better.  She has no complaints.  She knows how to give  herself Lovenox shots now.  Her diarrhea has essentially stopped, and  she overall is feeling much better.   Her physical examination this morning shows temperature of 98.5, pulse  76, respirations 16, blood pressure 122/62, and O2 sats 96% on 2 L.  Her  chest is clear.  Her heart is regular.  Her abdomen is soft.  She looks  much better.    Her PT is 15.5 with an INR of 1.2.  CBC shows white count 4400, and  hemoglobin is 10.3.   ASSESSMENT:  She has had a Salmonella colitis, improved.  She has had  deep vein thrombosis, being treated.  She has diabetes, which is stable.  She has coronary artery occlusive disease stable.   PLAN:  Discharge home.  Please see discharge summary for details.      Edward L. Luan Pulling, M.D.  Electronically Signed     ELH/MEDQ  D:  08/27/2008  T:  08/27/2008  Job:  [id number]

## 2011-02-26 NOTE — Group Therapy Note (Signed)
[name], [name]              ACCOUNT NO.:  [id number]   MEDICAL RECORD NO.:  [id number]          PATIENT TYPE:  INP   LOCATION:  A316                          FACILITY:  [facility]   PHYSICIAN:  Edward L. Luan Pulling, M.D.DATE OF BIRTH:  Mar 30, 1941   DATE OF PROCEDURE:  DATE OF DISCHARGE:                                 PROGRESS NOTE   PROBLEMS:  Diarrhea with Salmonella.  I have discussed [name]  situation with Dr. Gala Romney.  He has talked to ID specialist in [place]  and I have decided that we should go ahead and treat her for the  Salmonella, which makes sense to me and she may will be a little bit  better.  She has no other new complaints.   PHYSICAL EXAMINATION:  VITALS:  Temperature 100.2, pulse 108,  respirations 20, blood pressure 131/68, and O2 sats 92%.  Her chest is  clear.  Her abdomen is still very soft without tenderness.   ASSESSMENT:  I am very pleased that she is on treatment, hopefully.  She  will continue to improve.  She has had a little bit less diarrhea.      Edward L. Luan Pulling, M.D.  Electronically Signed     ELH/MEDQ  D:  08/20/2008  T:  08/20/2008  Job:  [id number]

## 2011-02-26 NOTE — Group Therapy Note (Signed)
NAMEELLASYN, LIPS              ACCOUNT NO.:  [id number]   MEDICAL RECORD NO.:  [id number]          PATIENT TYPE:  INP   LOCATION:  A316                          FACILITY:  [facility]   PHYSICIAN:  Edward L. Luan Pulling, M.D.DATE OF BIRTH:  30-Jan-1941   DATE OF PROCEDURE:  DATE OF DISCHARGE:                                 PROGRESS NOTE   Ms. Cilia seems to be doing a little bit better.  She continues to  have diarrhea; nevertheless, she has had about 8 stools for the last 24  hours.  She still has low-grade fever as well.   PHYSICAL EXAMINATION:  VITAL SIGNS:  Temperature 99.4 at 1600 yesterday  and 99.3 at 2230 yesterday.  Pulse rate is still somewhat elevated  as  well at 117 and blood pressure 128/68.  CHEST:  Pretty clear.  ABDOMEN:  Soft.   ASSESSMENT:  She has acute Salmonella colitis, and I have discussed the  situation with Dr. Gala Romney again today and he agreed that it is reasonable  to continue with her IV treatments today and then see if she can improve  the diarrhea before we switch her to p.o.  I do not plan any other  treatments.  I am going to recheck her labs in the morning.  As far as  her diabetes is concerned, she is fairly well controlled, her blood  sugar is in the 200s.  Her blood pressure is okay.  As far as her  coronary disease, she has not had any chest pain.      Edward L. Luan Pulling, M.D.  Electronically Signed     ELH/MEDQ  D:  08/22/2008  T:  08/22/2008  Job:  [id number]

## 2011-02-26 NOTE — Group Therapy Note (Signed)
[name], STRADER              ACCOUNT NO.:  [id number]   MEDICAL RECORD NO.:  [id number]          PATIENT TYPE:  INP   LOCATION:  A316                          FACILITY:  [facility]   PHYSICIAN:  Edward L. Luan Pulling, M.D.DATE OF BIRTH:  [birth year]   DATE OF PROCEDURE:  DATE OF DISCHARGE:                                 PROGRESS NOTE   Ms. Kray continues to have diarrhea.  She says she not having lot of  cramping and she feels little bit better, but she is still not  comfortable and still have a lot of diarrhea.   PHYSICAL EXAMINATION:  GENERAL:  She does look like she does not feel  well.  VITAL SIGNS:  Her temperature is 98, pulse 127, respirations 20, and  blood pressure 153/80.  CHEST:  Clear.  HEART:  Regular.  ABDOMEN:  Soft.  Bowel sounds are somewhat hyperactive.  EXTREMITIES:  No edema.  Bowel sounds are somewhat hyperactive.   ASSESSMENT:  She has diarrhea.  We do not have a C. Difficile back yet  on account of being reintubated over 48 hours and now on IV, etc.  I had  started Flagyl yesterday and she has just not had any improvement, so I  think we need to go ahead and get GI involved.  She is tachycardic, so I  am going to start her back on her metoprolol.  She remains on clear  liquids.  I think her hydration to this better.  I do not plan to change  anything else, I am going to continue the Flagyl.  Her blood sugars have  been pretty good so we are going to leave her on her medications for  that.      Edward L. Luan Pulling, M.D.  Electronically Signed     ELH/MEDQ  D:  08/15/2008  T:  08/15/2008  Job:  [id number]

## 2011-02-26 NOTE — Group Therapy Note (Signed)
[name], [name]              ACCOUNT NO.:  [id number]   MEDICAL RECORD NO.:  [id number]          PATIENT TYPE:  INP   LOCATION:  A316                          FACILITY:  [facility]   PHYSICIAN:  Edward L. Luan Pulling, M.D.DATE OF BIRTH:  1941/07/20   DATE OF PROCEDURE:  DATE OF DISCHARGE:                                 PROGRESS NOTE   [name] had developed the low oxygen level yesterday and because of  all that she ended up having a CT angiogram, which was positive for  pulmonary emboli.  She had not been given prophylactic dose of Lovenox  because of her GI bleeding.  She is now on Lovenox and she is going to  stop Coumadin.  Her diarrhea, which had been such a problem, is better.   Assessment then, she has coronary artery occlusive disease, which is  stable.  She has diarrhea with Salmonella.  She now has pulmonary  emboli, so she is going to be on Lovenox and Coumadin.  She is diabetic.  We have discussed the fact that she does have to have an overlap of her  Coumadin and her Lovenox for a period of 5 days, but that does not mean  that she necessarily needs to be in the hospital entire time.   PHYSICAL EXAMINATION:  VITAL SIGNS:  Temp is 99.3, pulse 103,  respirations 20, blood pressure 149/56, and her O2 sats 92% on 2 L.  CHEST:  Clearer.   ASSESSMENT:  She has got multiple problems as above.  In addition, I did  not have her on sequential compression devices, because I was concerned  that it would keep her in the bed and not make her ambulatory.  She was  ambulating well for the last 3 or 4 days.  In fact, we had hoped to get  her home yesterday.      Edward L. Luan Pulling, M.D.  Electronically Signed     ELH/MEDQ  D:  08/24/2008  T:  08/24/2008  Job:  [id number]

## 2011-02-26 NOTE — Discharge Summary (Signed)
[name], [name]              ACCOUNT NO.:  [id number]   MEDICAL RECORD NO.:  [id number]          PATIENT TYPE:  OIB   LOCATION:  [id number]                         FACILITY:  [facility]   PHYSICIAN:  Belva Crome, M.D.   DATE OF BIRTH:  02-20-1941   DATE OF ADMISSION:  02/29/2008  DATE OF DISCHARGE:  03/01/2008                               DISCHARGE SUMMARY   DISCHARGE DIAGNOSES:  1. Coronary artery disease, status post drug-eluting stent to the mid      and distal right coronary artery.  2. Diabetes mellitus.  3. Hypertension.  4. Dyslipidemia.  5. Mild obesity.  6. Long-term medication use.   HOSPITAL COURSE:  [name] is a 71 year old female who had a recent  stress Cardiolite for routine purposes.  She has a history of known  coronary artery disease with a previous stent placement to a LAD in  2006.  Chest Cardiolite revealed no clear ischemia and a fixed distal  anterior wall defect.  It was suggested that she proceed with cardiac  catheterization.  She is found to have 85% mid right coronary artery  stenosis and a 80% distal RCA stenosis.  In these areas, we placed a  PROMUS stents and the patient tolerated this well.  The patient was  admitted in the hospital over night and is ready for discharge to home  in the following day.   DISCHARGE INSTRUCTIONS:  Clean cath site gently with soap and water, no  scrubbing.  Remain on a low sodium, heart-healthy diabetic diet.  Increase activity slowly as per cardiac rehabilitation.  No lifting over  10 pounds for 1 week, no driving for 2 days. Follow up with Dr.  Melchor Amour nurse practitioner on March 15, 2008, at 10:15 a.m.   DISCHARGE MEDICATIONS:  1. Plavix 75 mg a day.  2. Enteric-coated aspirin 325 mg a day.  3. Lopressor 50 mg a day.  4. Hydrochlorothiazide 25 mg a day.  5. Zocor 40 mg nightly.  6. Glipizide 10 mg a day.  7. Norvasc 5 mg a day.  8. Sublingual nitroglycerin p.r.n. chest pain.      Joesphine Bare,  P.A.      Belva Crome, M.D.  Electronically Signed    LB/MEDQ  D:  03/01/2008  T:  03/02/2008  Job:  [id number]

## 2011-02-26 NOTE — Assessment & Plan Note (Signed)
[name], [name]               CHART#:  [id number]   DATE:  09/27/2008                       DOB:  January 14, 1941   PROBLEM LIST:  1. Salmonella colitis.  2. Diabetes.  3. Hypertension.  4. Hyperlipidemia.   SUBJECTIVE:  [name] is a 70 year old female who was initially seen  as an inpatient for bloody diarrhea.  Her stool cultures came back  positive for Salmonella.  She also had colonoscopy in November 2009,  which revealed active colitis.  She was hospitalized for 2 weeks and her  symptoms resolved without antibiotics.  Her bowels are now normal.  She  denies any abdominal cramps.  Her appetite is almost back to normal.  She carries Germ-X in her car and in her purse.   MEDICATIONS:  1. Glipizide 10 mg b.i.d.  2. Plavix 75 mg b.i.d.  3. Metoprolol 50 mg b.i.d.  4. Enalapril 10 mg b.i.d.  5. Amlodipine 5 mg daily.  6. HCTZ 25 mg as needed.  7. Zocor 40 mg daily.   OBJECTIVE:  VITAL SIGNS:  Weight 208 pounds, height 5 feet 5 inches, BMI  34.6 (obese), temperature 97.9, blood pressure 112/70, and pulse 76.  GENERAL:  She is in no apparent distress.  Alert and oriented x4.HEENT:  Atraumatic and normocephalic.  Pupils equal and reactive to light.  Mouth, no oral lesions.  Posterior pharynx without erythema or  exudate.NECK:  Full range motion.  No lymphadenopathy.LUNGS:  Clear to  auscultation bilaterally.CARDIOVASCULAR:  Regular rhythm.  No murmur.  Normal S1 and S2.ABDOMEN:  Bowel sounds are present.  Soft, nontender,  and nondistended.  No rebound or guarding.   ASSESSMENT:  [name] is a 70 year old female who had Salmonella  colitis and her symptoms are now resolved.  She is also on Plavix and  has a history of coronary artery disease and was on aspirin prior to her  hospitalization. Thank you for allowing me to see Ms. Akkerman in  consultation.  My recommendations follow.   RECOMMENDATIONS:  1. She may restart her aspirin.  She needs to add omeprazole for  GI      prophylaxis.  She was given a prescription for 1 p.o. daily and      refill up to a year.  2. Screening colonoscopy in 2019, because her first degree relative      had colon cancer at age greater than 46.  3. She may follow up with me as needed.  4. She should follow a high-fiber diet.  She is given a handout on      high-fiber diet.       Caro Hight, M.D.  Electronically Signed     SM/MEDQ  D:  09/27/2008  T:  09/27/2008  Job:  [id number]   cc:   Percell Miller L. Luan Pulling, M.D.

## 2011-02-26 NOTE — Group Therapy Note (Signed)
[name], [name]              ACCOUNT NO.:  [id number]   MEDICAL RECORD NO.:  [id number]          PATIENT TYPE:  INP   LOCATION:  A316                          FACILITY:  [facility]   PHYSICIAN:  Edward L. Luan Pulling, M.D.DATE OF BIRTH:  [birth year]   DATE OF PROCEDURE:  08/21/2008  DATE OF DISCHARGE:                                 PROGRESS NOTE   HISTORY:  [name] is overall I think better, but she still has some  fever.  She had a temperature of 102 last night.  However, she says she  feels  a little better.  She is having a bit less diarrhea.   PHYSICAL EXAMINATION:  VITAL SIGNS:  Exam this morning shows a  temperature of 99.1, pulse 98, respirations 18, blood pressure 142/79,  and O2 sats 92%.  CHEST:  Clear.   ASSESSMENT:  She is better.   PLAN:  Continue with her treatment.  Stay on the IVs for now and follow.      Edward L. Luan Pulling, M.D.  Electronically Signed     ELH/MEDQ  D:  08/21/2008  T:  08/21/2008  Job:  [id number]

## 2011-02-26 NOTE — Group Therapy Note (Signed)
NAMEWILSON, GROOT              ACCOUNT NO.:  [id number]   MEDICAL RECORD NO.:  [id number]          PATIENT TYPE:  INP   LOCATION:  A316                          FACILITY:  [facility]   PHYSICIAN:  Edward L. Luan Pulling, M.D.DATE OF BIRTH:  1941/04/24   DATE OF PROCEDURE:  08/14/2008  DATE OF DISCHARGE:                                 PROGRESS NOTE   HISTORY OF PRESENT ILLNESS:  Ms. Prine was admitted yesterday with  intractable diarrhea, and she continues to have diarrhea now.  She says  she has had some aching, and she had some fever at home.  She had not  been on antibiotics recently, at least in the last week or so.  Otherwise, she says she is okay.  She has a history of diabetes.  Her  blood sugar is fairly well controlled.  She also has a history of  coronary artery occlusive disease, and that is stable.  No chest pain.   PHYSICAL EXAMINATION:  GENERAL:  This morning, she is awake and alert.  VITAL SIGNS:  Temperature is 99.4, pulse 80, respirations 18, and blood  pressure 111/56.  CHEST:  Clear.  ABDOMEN:  Shows hyperactive bowel sounds.  She is mildly tender.   LABORATORY DATA:  Her white blood count is 8400, hemoglobin is 12.1, and  platelet 203.  Blood sugar 147.  Her electrolytes show a BUN of 27, and  creatinine 1.57.  We are awaiting stool for C difficile.   ASSESSMENT:  Since she is not cleared much with fluids, I am going to  have her go ahead and get a start on Flagyl assuming that this may be  Clostridium difficile colitis.  I have told her if she is not cleared by  in the morning, and we will get a GI consultation.      Edward L. Luan Pulling, M.D.  Electronically Signed     ELH/MEDQ  D:  08/14/2008  T:  08/14/2008  Job:  [id number]

## 2011-02-26 NOTE — Group Therapy Note (Signed)
NAMEHAYDEN, VOZZELLA              ACCOUNT NO.:  [id number]   MEDICAL RECORD NO.:  [id number]         PATIENT TYPE:  PINP   LOCATION:  A316                          FACILITY:  [facility]   PHYSICIAN:  Edward L. Luan Pulling, M.D.DATE OF BIRTH:  19-Sep-1941   DATE OF PROCEDURE:  08/26/2008  DATE OF DISCHARGE:                                 PROGRESS NOTE   [name] is doing better.  She has been able to wean her oxygen  significantly and overall has improved.  She has no new complaints.  She  is going to learn how to give herself shots today, and they will have it  ready for Lovenox in home tomorrow.   Her exam shows a temperature 98.8, pulse 89, respirations 18, blood  pressure 139/68, and O2 sat 94% on 1.5 L and then 93% on room air.  She  is not having anymore diarrhea.  She feels better.  Physical examination  shows that she is awake and alert, the rest per as above.   ASSESSMENT:  She has diabetes, which is fairly well controlled.  She has  a pulmonary embolism, she is being treated for that.  She has Salmonella  colitis, she is being treated for that.  She has got fairly mild  gastrointestinal bleeding, she is not being treated for that, and she  has coronary artery disease and hypertension, both of which are well  controlled.      Edward L. Luan Pulling, M.D.  Electronically Signed     ELH/MEDQ  D:  08/27/2008  T:  08/27/2008  Job:  [id number]

## 2011-02-26 NOTE — Group Therapy Note (Signed)
[name], [name]              ACCOUNT NO.:  [id number]   MEDICAL RECORD NO.:  [id number]          PATIENT TYPE:  INP   LOCATION:  A316                          FACILITY:  [facility]   PHYSICIAN:  Edward L. Luan Pulling, M.D.DATE OF BIRTH:  December 25, 1940   DATE OF PROCEDURE:  DATE OF DISCHARGE:                                 PROGRESS NOTE   [name] states she is doing better.  She is not a short of breath  and generally, she feels a little bit better.  Her stools have slowed  down, but it have not gotten back to normal yet.  She has been up and  moving around.  She has lost some fluid.   PHYSICAL EXAMINATION:  GENERAL:  Today shows that she does look more  comfortable.  She is not as puffy.  CHEST:  Clear with no wheezing now.  HEART:  Regular.  VITAL SIGNS:  She is wearing her O2.  Her O2 sat is in the 90s.  She has  been afebrile.  The rest of her vitals per the patient encounter form.  Her blood sugar has still been somewhat high.  ABDOMEN:  Actually very soft.   ASSESSMENT:  I think, she has had pulmonary embolism.  She has  [name] infection in the colon that is better.  She has diabetes,  stable.  She has coronary artery occlusive disease, stable.   In my plan, then is for her to continue with her medications and  treatments, no changes today.  Continue with trying to get her more  mobile.  There is a possibility that she could go home tomorrow the next  day to finish Lovenox as an outpatient.      Edward L. Luan Pulling, M.D.  Electronically Signed     ELH/MEDQ  D:  08/25/2008  T:  08/26/2008  Job:  [id number]

## 2011-02-26 NOTE — H&P (Signed)
[name], [name]              ACCOUNT NO.:  [id number]   MEDICAL RECORD NO.:  [id number]          PATIENT TYPE:  INP   LOCATION:  A316                          FACILITY:  [facility]   PHYSICIAN:  Paula Compton. Willey Blade, MD       DATE OF BIRTH:  09/16/1941   DATE OF ADMISSION:  08/13/2008  DATE OF DISCHARGE:  LH                              HISTORY & PHYSICAL   CHIEF COMPLAINT:  Diarrhea.   HISTORY OF PRESENT ILLNESS:  This patient is a 70 year old white female  with a history of diabetes and coronary artery disease who presented to  the emergency room with a 2-day history of intractable diarrhea.  She  had taken multiple doses of Imodium without relief.  She had been on  Tamiflu.  She had described fever and nausea.  She has not had any  respiratory symptoms, however.  She had not experienced any bleeding  until on her last stool.  She noted a small amount of bright red blood.  She has not vomited.  She has a remote history of a Campylobacter  infection.  No other family members have been ill.  There had been no  unusual food ingestions.   PAST MEDICAL HISTORY:  1. Coronary artery disease status post MI and 2 stents.  2. Diabetes.  3. Hypertension.  4. Right hip surgery.  5. Cholecystectomy.  6. Ventral hernia repair.  7. Campylobacter enteritis.   MEDICATIONS:  1. Tamiflu 75 mg b.i.d.  2. Plavix 75 mg daily.  3. Aspirin 325 mg daily.  4. Metoprolol 50 mg b.i.d.  5. Hydrochlorothiazide 25 mg daily.  6. Zocor 40 mg daily.  7. Glipizide 10 mg b.i.d.  8. Vasotec 10 mg b.i.d.  9. Norvasc 5 mg daily.   ALLERGIES:  None.   SOCIAL HISTORY:  She does not smoke cigarettes, drink alcohol, or use  drugs.   FAMILY HISTORY:  Her mother had heart disease.  Her father had pulmonary  fibrosis.   REVIEW OF SYSTEMS:  Noncontributory.   PHYSICAL EXAMINATION:  VITAL SIGNS:  Temperature is 97.3; blood pressure  initially 76/48, now improved to 107/45; pulse 64; and respirations 15.  GENERAL:   Alert and in no distress.  HEENT:  No scleral icterus.  Pharynx is unremarkable.  NECK:  No JVD or thyromegaly.  LUNGS:  Clear.  HEART:  Regular with no murmurs.  ABDOMEN:  Overweight and nontender.  No hepatosplenomegaly.  EXTREMITIES:  No cyanosis, clubbing, or edema.  NEURO:  Grossly intact.  LYMPH NODES:  No cervical or supraclavicular enlargement.  SKIN:  Warm and dry.   LABORATORY DATA:  White count 9.4, hemoglobin 13.2, and platelets  251,000.  Sodium 131, potassium 3.2, bicarb 25, glucose 294, BUN 30,  creatinine 2.64, and calcium 8.9.   IMPRESSION:  1. Diarrhea/gastroenteritis.  Stool studies will be obtained.  Her      initial hypotension has responded well to IV fluids.  2. Dehydration.  Her BUN and creatinine are elevated at 30 and 2.6      after having been 20 and 0.9 three years ago.  She will be treated      with IV fluids, but her diet will be kept to a clear liquid diet      for now.  3. Hypokalemia.  She was treated with 40 mEq orally in the emergency      room.  We will continue IV potassium supplementation.  4. Mild hyponatremia.  5. Diabetes.  We will hold her oral agent and treat with NovoLog.  6. Coronary artery disease, stable.  7. Minor rectal bleeding since she has had a drug-eluting stent placed      in May.  Plavix will not be held at this point.  Aspirin will be      held today.  Further decision making in this regard will be left to      Dr. Luan Pulling.  8. Hypertension.  Hold hydrochlorothiazide, Vasotec, Norvasc, and      metoprolol for now.      Paula Compton. Willey Blade, MD  Electronically Signed     ROF/MEDQ  D:  08/13/2008  T:  08/13/2008  Job:  [id number]   cc:   Percell Miller L. Luan Pulling, M.D.  Fax: [phone]

## 2011-02-26 NOTE — Group Therapy Note (Signed)
NAMEVADIS, PERFECT              ACCOUNT NO.:  [id number]   MEDICAL RECORD NO.:  [id number]          PATIENT TYPE:  INP   LOCATION:  A316                          FACILITY:  [facility]   PHYSICIAN:  Edward L. Luan Pulling, M.D.DATE OF BIRTH:  11-Feb-1941   DATE OF PROCEDURE:  DATE OF DISCHARGE:                                 PROGRESS NOTE   Ms. Hoogendoorn continues to have diarrhea some of which I think is related  to having had taken the medication for prep for colonoscopy.  Otherwise,  she is doing well and has no complaints.   PHYSICAL EXAMINATION:  VITALS:  Temperature is 98, pulse is 83,  respirations 20, blood pressure 150/70.  CHEST:  Clear.   Blood sugar still remains in the 106 to 129 range.   Her lab work, stool culture, re-incubated for better growth, so we do  not have that.  We still do not have the C. difficile back.   ASSESSMENT:  She has diarrhea, which is continuing.   PLAN:  Plan is for her to have colonoscopy today by Dr. Stann Mainland, and  hopefully we will be able to get a diagnosis.      Edward L. Luan Pulling, M.D.  Electronically Signed     ELH/MEDQ  D:  08/16/2008  T:  08/16/2008  Job:  [id number]

## 2011-02-26 NOTE — Group Therapy Note (Signed)
[name], [name]              ACCOUNT NO.:  [id number]   MEDICAL RECORD NO.:  [id number]          PATIENT TYPE:  INP   LOCATION:  A316                          FACILITY:  [facility]   PHYSICIAN:  Edward L. Luan Pulling, M.D.DATE OF BIRTH:  [birth year]   DATE OF PROCEDURE:  DATE OF DISCHARGE:                                 PROGRESS NOTE   [name] says that as far as her diarrhea is concerned, she is  better, but she became hypoxic last night without any obvious cause.  She had some more fever last night as well.   PHYSICAL EXAMINATION:  VITAL SIGNS:  This morning shows her temperature  is 100.8, pulse 107, respirations 20, blood pressure went up to 148/102,  and O2 sats 93% on 3L.  CHEST:  Some rhonchi bilaterally, a little more on the left than on the  right.  HEART:  Regular.  ABDOMEN:  Soft.   ASSESSMENT:  She is, I think, better as far as her colitis is concerned  but now she has got hypoxia.  I am not sure of the cause.  She has not  been on prophylactic dose Lovenox because she had the GI bleeding.  She  is on Plavix.  She has been active.  She has been up and moving, but I  am going to go ahead and check a D-dimer.  If it is up, she is going to  have the CT chest.  I am going to get a chest x-ray and see what that  shows and then try to decide what we need to do from there.      Edward L. Luan Pulling, M.D.  Electronically Signed     ELH/MEDQ  D:  08/23/2008  T:  08/23/2008  Job:  [id number]

## 2011-02-26 NOTE — Discharge Summary (Signed)
NAMEFLECIA, GREMMINGER              ACCOUNT NO.:  [id number]   MEDICAL RECORD NO.:  [id number]          PATIENT TYPE:  INP   LOCATION:  A316                          FACILITY:  [facility]   PHYSICIAN:  Edward L. Luan Pulling, M.D.DATE OF BIRTH:  1940-12-31   DATE OF ADMISSION:  08/13/2008  DATE OF DISCHARGE:  11/14/2009LH                               DISCHARGE SUMMARY   DISCHARGE DIAGNOSES:  1. Salmonella colitis.  2. Severe diarrhea related to Salmonella colitis.  3. Gastrointestinal bleeding related to Salmonella colitis.  4. Acute renal failure related to Salmonella colitis.  5. Coronary artery occlusive disease, status post myocardial      infarction and 2 stents.  6. Diabetes.  7. Hypertension.  8. Hip surgery.  9. History of cholecystectomy.  10.Ventral hernia repair.  11.History of Campylobacter enteritis.  12.Deep venous thrombosis and pulmonary embolus.  13.Dehydration.  14.Hyponatremia.  15.Hypokalemia.   HISTORY:  Ms. Alphonse is a 70 year old who came with a history of  diabetes and coronary artery occlusive disease, who presented to the  emergency with a 2-day history of intractable diarrhea.  She has been on  Imodium, had been on Tamiflu, she has had fever and nausea.  She started  bleeding with the last stool.   PAST MEDICAL HISTORY:  Positive for multiple other medical problems.   PHYSICAL EXAMINATION:  VITAL SIGNS:  Temperature of 97.3 and blood  pressure initially 76/48, then 107/45.  CHEST:  Clear without wheezes, rales, or rhonchi.  HEART:  Regular.  ABDOMEN:  Nontender.   White blood count 9400, hemoglobin 13.2, platelets 251, sodium was 131,  potassium was 3.2, BUN was 30, and creatinine 2.64.   HOSPITAL COURSE:  She was still on antibiotics and she was started on  fluids.  GI consultation was obtained.  She was started on Flagyl with  the thought that it might be a pseudomembranous colitis.  However, she  underwent a colonoscopy, which did not show evidence  of pseudomembranes,  but did show what look like some ischemic colitis.  She later grew  Salmonella and it was felt to what had happened was that she had  Salmonella colitis, which led to the ischemia, which led to GI bleeding,  which also led to dehydration, and renal dysfunction.  She continued to  having some blood in her stool throughout the most of the  hospitalization.  Because of this Lovenox, was held her Plavix, her  aspirin was not.  She was ambulating throughout most of the  hospitalization, but was unable to ambulate very much because of the  diarrhea.  She was back up and down to go to the bathroom.  She was  improving.  She eventually was started on Rocephin because of the  Salmonella and because she did not improve with conservative treatment.  She improved rapidly on antibiotics..  We were planning discharge when  she developed shortness of breath.  She had a chest x-ray done, which  showed no definite abnormalities.  She had a D-dimer which was high, so  she underwent CT chest pulmonary embolism protocol and this did indeed  demonstrate pulmonary emboli.  She was started on Lovenox.  She is being  discharged to home on Lovenox 150 mg daily times at least two more days.  She is going to be on Coumadin 10 mg daily.  She is going to continue  with her other treatments which are Plavix 75 mg daily, aspirin 325 mg  daily, metoprolol 50 mg b.i.d., HCTZ 25 mg daily, Zocor 40 mg daily,  glipizide 10 mg b.i.d. Vasotec 10 mg b.i.d., and Norvasc 5 mg daily.  She will need to be on the Coumadin for period of 2-3 months.  She is  going to have home health to follow her protimes.  She is also going to  be on Cipro 500 mg daily x5 more days.  I told her, she can use Imodium  as needed as over-the-counter.  She does not need oxygen, her O2 sat was  96% on room air.      Edward L. Luan Pulling, M.D.  Electronically Signed     ELH/MEDQ  D:  08/27/2008  T:  08/27/2008  Job:  [id number]

## 2011-03-01 NOTE — Op Note (Signed)
[facility]  Patient:    [name], [name] Visit Number: [id number] MRN: [id number]          Service Type: MED Location: 2A [id number] Attending Physician:  Christiana Pellant Dictated by:   Irving Shows, M.D. Admit Date:  10/21/2001   CC:         Ginger Carne, M.D.   Operative Report  PREOPERATIVE DIAGNOSIS:  Incisional hernia with small-bowel obstruction.  POSTOPERATIVE DIAGNOSIS:  Incision hernia with a small-bowel incarceration and early vascular compromise and obstruction.  PROCEDURE:  Exploratory laparotomy with omentectomy and incisional hernia repair.  SURGEON:  Irving Shows, M.D.  ANESTHESIA:  General anesthesia.  DESCRIPTION OF PROCEDURE:  Under general anesthesia, the patients abdomen was prepped and draped in a sterile field.  Midline incision was made, extending over the large mass in the midline.  In the superficial subcutaneous tissue, a large hernia sac was encountered.  It was dissected with electrocautery and blunt dissection.  Once this was done, the hernia sac was opened and a piece of small bowel with early vascular compromise with thickening of the bowel wall and some flaccid state to the bowel wall was encountered.  Once the neck of the hernia sac was increased, the bowel regained its vascularity and the muscular wall of the bowel showed peristalsis.  It was felt that it was not completely infarcted.  There was a large piece of omentum that was incarcerated in the hernia sac.  This omentum had a very small apex and it was felt that if it were reduced back into the abdomen, that it would have a high potential for internal hernia.  The omental base was therefore amputated at its juncture with the transverse colon; this was done over [facility] clamps. Hemostasis was achieved with ties of 2-0 silk.  All viscera was reduced back into the abdomen.  The fascia was attenuated and it was felt that it would be best to close it transversely; the  incision was closed transversely using interrupted figure-of-eight #1 Prolene.  The subcutaneous tissue was closed with interrupted 2-0 Biosyn and 3-0 Biosyn.  Skin was closed with staples. Patient tolerated procedure well.  Dressing was placed.  She was transferred to a bed and taken to the postanesthetic care unit. Dictated by:   Irving Shows, M.D. Attending Physician:  Christiana Pellant DD:  10/22/01 TD:  10/22/01 Job: [id number]

## 2011-03-01 NOTE — Cardiovascular Report (Signed)
[name], [name]              ACCOUNT NO.:  [id number]   MEDICAL RECORD NO.:  [id number]          PATIENT TYPE:  INP   LOCATION:  2313                         FACILITY:  [facility]   PHYSICIAN:  Belva Crome, M.D.   DATE OF BIRTH:  24-Nov-1940   DATE OF PROCEDURE:  05/24/2005  DATE OF DISCHARGE:                              CARDIAC CATHETERIZATION   INDICATIONS:  The patient had a recent aborted acute inferior myocardial  infarction and was found to have multivessel coronary disease and despite  recommendations to the contrary chose percutaneous coronary intervention of  culprit lesions rather than bypass grafting.   PROCEDURES PERFORMED:  1.  DES stent using Cypher in the midright coronary (the culprit vessel for      infarction).  2.  DES Cypher stent in the midLAD.   DESCRIPTION:  The patient was brought to the cath lab. Informed consent had  been obtained on the day prior to the procedure. She has been on Integrilin  since admission to the hospital five days ago. She had a 6-French sheath  placed in the right femoral artery using modified Seldinger technique. A  bolus followed by an infusion of bivalirudin was started. ACT was documented  to be greater than 250. We initially directed our attention to the right  coronary and used a Cordis side-hole 4.0 Judkins right catheter and a BMW  wire. We were able to cross the stenosis in the distal right coronary and  then predilated the high-grade stenosis in the mid right coronary with a 2.5  x 15 Maverick balloon. We then deployed an 18 x 3.0 Cypher stent to 15  atmospheres. There was a proximal edge dissection and 3.0 x 8 mm Cypher  stent was placed overlapping the proximal margin to a peak pressure of 14  atmospheres. We used a 2.0 x 15 mm long Maverick balloon to dilate the PDA.  The post intervention angiographic result was quite acceptable with  reduction of the culprit lesion to 0% and reduction of the distal RCA  stenosis  from 99% to less than 50% with PTCA.   We then turned our attention to the left coronary. We used a XB LAD 6-French  left coronary 3.5 guide catheter. We used the same BMW wire. We predilated  the LAD with a 3.0 x 20 Maverick balloon and placed a 3.0 x 33 Cypher stent.  Three balloon inflations were performed to a peak pressure of 14  atmospheres. Beautiful angiographic result was obtained. TIMI grade III flow  was noted. The case was then terminated after AngioSeal arteriotomy closure  was performed with good hemostasis.   CONCLUSION:  1.  Successful stent of the culprit right coronary with overlapping Cypher      stents in the mid right coronary from 99% to 0%.  2.  Successful percutaneous transluminal coronary angioplasty of the      posterior descending artery branch from 99% to the less than 50%.  3.  Successful left anterior descending artery stent from segmental 80% plus      stenosis to 0% with TIMI grade III flow  using a Cypher drug-eluting      stent.   PLAN:  Discontinue Angiomax. Discontinue Integrilin in four to six hours.  Discontinue IV nitroglycerin. Ambulate. Possible discharge May 25, 2005  in the p.m. We will increase Lopressor dose to 50 milligrams b.i.d. The  patient's renal function should be normal before the ACE inhibitor is  resumed. Should be no recurrent angina.      Belva Crome, M.D.  Electronically Signed     HWS/MEDQ  D:  05/24/2005  T:  05/25/2005  Job:  [id number]   cc:   Percell Miller L. Luan Pulling, M.D.  [place]  [place] [address]  Fax: [phone]

## 2011-03-01 NOTE — Discharge Summary (Signed)
[name], HORVITZ              ACCOUNT NO.:  [id number]   MEDICAL RECORD NO.:  [id number]          PATIENT TYPE:  INP   LOCATION:  2009                         FACILITY:  [facility]   PHYSICIAN:  Belva Crome, M.D.   DATE OF BIRTH:  09-11-1941   DATE OF ADMISSION:  05/20/2005  DATE OF DISCHARGE:  05/27/2005                                 DISCHARGE SUMMARY   CHIEF COMPLAINT AND REASON FOR ADMISSION:  [name] is a 70 year old  patient with no past history of cardiac disease, who experienced the onset  of chest pain on the afternoon of admission while at home.  She described  this as pressure in the upper anterior chest without radiation associated  with diaphoresis and nausea, no dyspnea.  The chest pain continued for 30  minutes and then resolved spontaneously.  It recurred again after  presentation to the ER at [facility].  Her cardiac enzymes done at  [facility] showed evidence of acute MI, so she was transferred to [facility] for further evaluation.   At [facility] her BP was 101/78, pulse 106 and regular, temperature 98.6.  Physical exam was otherwise unremarkable.  EKG showed ST segment depression  in I and aVL with 1-1.5 mm ST segment elevation in lead III, minimal ST  segment elevation in aVF.  Initial cardiac markers:  Troponin 6.9 with a  second troponin of 11.5.  Initial creatinine 0.9, potassium 4.7 and BUN 20.  The patient was admitted with Dr. Ezzie Dural with the following diagnoses:   1.  Non-Q-wave acute myocardial infarction, evolving.  2.  Hypertension.  3.  Dyslipidemia.  4.  Diabetes.   HOSPITAL COURSE:  Problem 1.  ACUTE NON-Q-WAVE MYOCARDIAL INFARCTION:  The patient was  admitted to the cardiac step-down step, where serial cardiac isoenzymes were  started.  She was started on aspirin, IV heparin, IV Integrilin, IV  nitroglycerin.  She was started on metoprolol, Lipitor, and a fasting lipid  panel was obtained.  Dr. Tamala Julian evaluated the  patient the following morning  and determined that the patient needed to be catheterized on the same date,  May 21, 2005.  Risks and benefits of this procedure were discussed with  the patient per Dr. Tamala Julian and she agreed to proceed.  She was taken to the  catheterization lab at 2 o'clock that afternoon.  Her left ventriculogram  showed inferior severe hypokinesis with an EF of 45%, no MR.  LAD midsegment  80%, diagonal #1 90%, ramus 90% mid, circumflex severe diffuse mid-disease  90%, probably not graftable, RCA diffuse 50% mid and 99% thrombotic mid-  distal RCA.  At this point Dr. Tamala Julian felt she had severe three-vessel CAD  and recommended a CVTS evaluation for possible CABG procedure versus culprit  PCI.  She was continued on aspirin and Integrilin in the immediate post-  catheterization period.  Dr. Prescott Gum with CVTS did evaluate the patient,  and he agreed that coronary artery bypass grafting surgery would be the best  recommendation for treatment options.  Unfortunately, the patient was afraid  of proceeding with  CABG procedure because of her husband's past CABG course.  He had developed mediastinitis, sternal infection requiring extensive  surgical procedures with flap and sternal resection.  She is quite concerned  for her healing.  Dr. Tamala Julian had a long discussion with the patient regarding  this issue.  He still recommended CABG as best option and most complete  treatment for revascularization for her underlying coronary artery disease.  She continued to postulate about this and told Dr. Tamala Julian that she would let  him know later on the evening of May 22, 2005, which is on a Thursday.  If  she agreed to PCI, then Dr. Tamala Julian was planning on doing this on Friday,  May 23, 2005.   In following the patient's cardiac enzymes, they were markedly elevated with  a troponin I of greater than 100.  The patient otherwise remained stable  from a cardiac standpoint.  She was continued  on aspirin, a beta blocker,  ACE inhibitor, IV nitroglycerin and Integrilin until a decision could be  determined.   By the morning of May 23, 2005, she had gas pains during the night but no  actual chest discomfort.  She has decided against CABG and wanted culprit  PCI of the RCA.  Because she was undergoing coronary angiography, her  Vasotec was placed on hold.  Creatinine was 0.9 that morning, potassium 4.0.  She was taken to the catheterization lab on May 24, 2005, where she  underwent PCI and Cypher stent implantation to the RCA as well as PCI with  Cypher stent implantation to the LAD.  Please see Dr. Thompson Caul  catheterization report for discussion of this complicated procedure.  She  was started on Plavix in the immediate post-interventional procedure.   In reviewing DR. Smith's notes, he also recommended aspirin and Plavix  lifelong, continuing Lopressor, resuming Vasotec and having sublingual  nitroglycerin available for p.r.n. use.   By the following day on May 25, 2005, the patient was doing well.  Her  blood pressure had decreased to the 70s during the night and she had  episodic bradycardia with a heart rate down into the 40s.  Her Lopressor was  decreased to 12.5 mg b.i.d.  EKG was checked to assure that this was not  related to acute ischemia.  This was within normal limits.  The Vasotec was  also placed on hold due to hypotension.  Because of the markedly elevated  cardiac enzymes from her initial cardiac event, these were repeated and her  IV Integrilin was also restarted.  Repeat enzymes showed that she was  actually trending down with troponin 12.21 and then back down to 11.21.  By  May 26, 2005, she was doing well.  Her Integrilin was discontinued.  Her  Vasotec was restarted at 2.5 mg daily.   By May 27, 2005, Dr. Tamala Julian felt the patient was appropriate for discharge home.  Physical exam was unremarkable.  BP was 115/70, heart rate 88.  He  suspected  some of her nausea and chest pain as well as hypertension were  also related to vagal issues.  He felt it was appropriate to increase the  patient's Lopressor to 25 mg b.i.d.   Problem 2.  DYSLIPIDEMIA:  The patient had lipid panel checked during  hospitalization.  Her cholesterol was 245, LDL was 166, HDL 46, and  triglycerides 164.  She was started on Zocor 40 mg daily with consideration  for adding Zetia at a later date.   Problem  3.  DECREASED LEFT VENTRICULAR SYSTOLIC FUNCTION WITH EJECTION  FRACTION 45% PER CATHETERIZATION:  A 2-d echocardiogram was done on May 22, 2005.  This showed normal LV systolic function.  Her EF had improved to  55-65%.  There were no LV regional wall motion abnormalities.  The aortic  valve thickness was mildly increased and she had mildly calcified aortic  valve without stenosis.  The left atrium was mildly to moderately dilated.   FINAL DISCHARGE DIAGNOSES:  1.  Non-Q-wave myocardial infarction, severe three-vessel coronary artery      disease.  2.  Status post percutaneous coronary intervention and Cypher stent      implantation to the right coronary artery as well as percutaneous      coronary intervention and Cypher stent implantation to the left anterior      descending artery.  3.  Initial decreased left ventricular systolic function, ejection fraction      45%, secondary to myocardial stunning with repeat echo showing normal      left ventricular function with an ejection fraction of 55-65%.  4.  Dyslipidemia as evidenced by elevated total cholesterol and elevated      LDL.  5.  Diabetes mellitus.  6.  Transient elevation of AST secondary to acute myocardial infarction.   DISCHARGE MEDICATIONS:  Unfortunately, I do not have a medication sheet with  discharge medications listed.  I will try to piece this together based on  the progress notes as follows:   1.  Lopressor 25 mg b.i.d.  2.  Vasotec 2.5 mg daily.  3.  Plavix 75 mg daily.  4.   Enteric-coated aspirin 325 mg daily.  5.  Zocor 40 mg daily.  6.  Glucotrol XL dosage unknown.  7.  Glucophage dosage unknown, held 48 hours post coronary angiography.   OTHER DISCHARGE INSTRUCTIONS:  1.  The patient will follow up with Dr. Tamala Julian in one to two weeks.      Recommend a lipid panel in six to eight weeks.  2.  The patient is to follow up with primary care physician in one to two      weeks.  3.  The patient is not to return to work or any heavy exertional activity      until she follows up with Dr. Tamala Julian.  4.  The patient is probably on nitroglycerin 0.4 mg sublingual p.r.n. chest      pain.      Wickliffe Lissa Merlin, N.P.      Belva Crome, M.D.  Electronically Signed    ALE/MEDQ  D:  07/30/2005  T:  07/30/2005  Job:  [id number]   cc:   Ivin Poot, M.D.  [address]  [place]  [place] [address]   Edward L. Luan Pulling, M.D.  Fax: [phone]

## 2011-03-01 NOTE — Discharge Summary (Signed)
[facility]  Patient:    [name], [name] Visit Number: [id number] MRN: [id number]          Service Type: MED Location: 2A [id number] Attending Physician:  Delorise Jackson Dictated by:   Irving Shows, M.D. Admit Date:  10/21/2001 Discharge Date: 10/24/2001                             Discharge Summary  DISCHARGE DIAGNOSES: 1. Incarcerated incisional hernia. 2. Hypertension. 3. Non-insulin-dependent diabetes mellitus. 4. Osteoarthritis.  PROCEDURES:  Exploratory laparotomy with omentectomy and incisional hernia repair.  DISPOSITION:  The patient is discharged home.  CONDITION ON DISCHARGE:  Stable and satisfactory condition.  DISCHARGE MEDICATIONS: 1. Tylox 1 q.i.d. 2. Phenergan 25 mg q.4h. p.r.n. 3. Glucotrol XL 5 mg q.d. 4. Starlix 30 mg a.c. and h.s. 5. Vasotec 10 mg q.d. 6. Avalide 150/12.5 q.d.  FOLLOW-UP:  The patient is scheduled to be seen in the office on November 02, 2000.  HISTORY OF PRESENT ILLNESS:  The patient is a 69 year old female with a long history of paraumbilical incisional hernia with acute onset of severe swelling of her hernia on the day of admission.  The vast increase in size indicated nonreducible.  She presented with acute ulceration of bowel and omentum with mesenteric edema by CT scan.  She was admitted for emergency exploratory surgery.  PAST MEDICAL HISTORY: 1. Diabetes mellitus. 2. Hypertension. 3. Osteoarthritis.  PHYSICAL EXAMINATION:  GENERAL:  In no acute distress.  VITAL SIGNS:  Stable except blood pressure was elevated at 170/60.  She was afebrile.  HEENT:  Some healing oral gum lesions from dental surgery.  ABDOMEN:  Nonreducible midline tender incisional hernia without any overlying skin changes.  She did have hyperactive bowel sounds.  HOSPITAL COURSE:  The patient was started on Levaquin, Flagyl, and Cefotan IV and was taken to the OR.  At the time of exploration she had a large piece  of omentum and small bowel incarcerated in a large hernia sac with early vascular compromise to the loop of small bowel.  The bowel wall was not infarcted.  The omentum was significantly edematous, and it was felt that it would create a problem if it was reduced back into the abdomen.  A partial omentectomy was done.  The hernia was then closed transversely without mesh.  The patient did well in the postoperative period.  She remained afebrile.  She had flatus on the first postoperative day, and her nasogastric tube was clamped.  She had no nausea or vomiting.  Her diet was advanced, and she tolerated this without difficulty.  She was started on oral hypoglycemics on October 23, 2000, and was discharged home on October 24, 2000, in satisfactory condition.  Blood pressure was not well controlled during this hospitalization.  Diabetes was fairly well controlled.  She was discharged home in stable condition. Dictated by:   Irving Shows, M.D. Attending Physician:  Delorise Jackson DD:  11/14/01 TD:  11/15/01 Job: [id number]

## 2011-03-01 NOTE — Procedures (Signed)
[name], [name]              ACCOUNT NO.:  [id number]   MEDICAL RECORD NO.:  [id number]          PATIENT TYPE:  EMS   LOCATION:  ED                            FACILITY:  [facility]   PHYSICIAN:  Edward L. Luan Pulling, M.D.DATE OF BIRTH:  Nov 01, 1940   DATE OF PROCEDURE:  05/20/2005  DATE OF DISCHARGE:  05/20/2005                                EKG INTERPRETATION   TIME:  1904 hours  DATE:  May 20, 2005   The rhythm is sinus tachycardia with a rate of about 110. There is an  interventricular conduction delay. There is left atrial enlargement. Poor R-  wave progression across the precordium is seen. There are ST-T wave  abnormalities which could be due to ischemia and clinical correlation is  suggested. Abnormal electrocardiogram.       ELH/MEDQ  D:  05/21/2005  T:  05/21/2005  Job:  [id number]

## 2011-03-01 NOTE — H&P (Signed)
[name], [name]              ACCOUNT NO.:  [id number]   MEDICAL RECORD NO.:  [id number]          PATIENT TYPE:  INP   LOCATION:  2313                         FACILITY:  [facility]   PHYSICIAN:  Broadus John, MD DATE OF BIRTH:  25-Nov-1940   DATE OF ADMISSION:  05/20/2005  DATE OF DISCHARGE:                                HISTORY & PHYSICAL   HISTORY OF PRESENT ILLNESS:  [name] is a 70 year old white woman who  is admitted to [facility]  with a non-ST segment elevation acute  myocardial infarction.   The patient, who has no past history of cardiac disease, experienced the  onset of chest pain this afternoon while at home.  The chest pain was  described as a pressure in the upper anterior chest.  It did not radiate.  It was associated with diaphoresis and nausea but no dyspnea.  There were no  exacerbating or ameliorating factors.  It appeared to be unrelated to  position, activity, meals, or respirations.  It continued for approximately  30 minutes and then resolved.  When it occurred again later, she presented  to the emergency department at [facility].  Cardiac enzymes  demonstrated evidence of acute myocardial injury, and hence she was  transferred to [facility].  She is free of chest pain and otherwise  asymptomatic at this time.   As noted, the patient has no past history of cardiac disease including no  history of chest pain, myocardial infarction, coronary artery disease,  congestive heart failure, or arrhythmia.  She has a number of risk factors  for coronary artery disease including hypertension, dyslipidemia, diabetes  mellitus, and a family history of coronary artery disease (though at an  advanced age).  She has no history of smoking.   The patient has no other medical problems.   MEDICATIONS:  Glucophage, Glucotrol XL, Vasotec.   ALLERGIES:  None.   PAST SURGICAL HISTORY:  1.  Hernia.  2.  Cholecystectomy.  3.  Hip  surgery.   SOCIAL HISTORY:  The patient does not drink.  She lives with her husband.   REVIEW OF SYSTEMS:  No problems related to her head, eyes, ears, nose,  mouth, throat, lungs, gastrointestinal system, genitourinary system, or  extremities.  No history of neurologic or psychiatric disorder. There is no  history of fever, chills, __________ .   PHYSICAL EXAMINATION:  VITAL SIGNS:  Blood pressure 101/78, pulse 106 and  regular, respirations 20.  Temperature 98.6.  GENERAL:  The patient was an elderly white woman in no discomfort.  She was  alert, oriented, appropriate, and responsive.  HEENT:  Head, eyes, nose, and mouth were normal.  NECK:  Without thyromegaly or adenopathy.  Carotid pulses were palpable  bilaterally and without bruits.  CARDIAC:  Normal S1 and S2.  No S3, S4, murmur, rub, or click.  Cardiac  rhythm is regular.  CHEST:  No chest wall tenderness was noted.  LUNGS:  Clear.  ABDOMEN:  Soft, nontender.  There was no mass, hepatosplenomegaly, bruit,  distention, rebound, guarding, or rigidity.  Bowel sounds were  normal.  PELVIC/RECTAL:  Not performed as they were not pertinent to the reason for  acute care hospitalization.  EXTREMITIES:  Without edema, deviation, or deformity.  Radial and dorsalis  pedis pulses were palpable bilaterally.  NEUROLOGIC:  Prescreening  neurological survey was unremarkable.   LABORATORY DATA:  The electrocardiogram demonstrated ST segment depression  in leads I and aVL.  There was approximately 1-1.5 mm ST segment elevation  in lead III and minimal ST segment elevation in aVF.  The chest radiograph  report is pending at the time of this dictation.  The first set of cardiac  markers revealed a troponin of 6.9, revealed a myoglobin of greater than  500, CK-MB 69.4, troponin of 6.69.  Potassium was 4.7, BUN 20, creatinine  0.9.  White count was 7.2 with a hemoglobin of 14.2 and hematocrit of 41.6.  The second set of cardiac markers revealed  a myoglobin of greater than 500.  CK-MB 78.6, troponin 11.5.  The remaining studies were pending at the time  of this dictation.   IMPRESSION:  1.  Non-Q-wave acute myocardial infarction.  2.  Hypertension.  3.  Dyslipidemia.  4.  Diabetes mellitus.   PLAN:  1.  Cardiac step-down.  2.  Serial cardiac enzymes.  3.  Aspirin.  4.  Intravenous heparin.  5.  Intravenous Integrilin.  6.  Intravenous nitroglycerin.  7.  Metoprolol.  8.  Lipitor.  9.  Fasting lipid profile.  10. Further measures per Dr. Tamala Julian.       MSC/MEDQ  D:  05/21/2005  T:  05/21/2005  Job:  [id number]   cc:   Belva Crome, M.D.  [place] [employer]  [address]  [place] [address]  Fax: [phone]

## 2011-03-01 NOTE — Consult Note (Signed)
[name], [name]              ACCOUNT NO.:  [id number]   MEDICAL RECORD NO.:  [id number]          PATIENT TYPE:  INP   LOCATION:  2313                         FACILITY:  [facility]   PHYSICIAN:  Ivin Poot, M.D.  DATE OF BIRTH:  [birth year]   DATE OF CONSULTATION:  05/21/2005  DATE OF DISCHARGE:                                   CONSULTATION   REFERRING PHYSICIAN:  Belva Crome, M.D.   PRIMARY CARE PHYSICIAN:  Dr. Luan Pulling in [name]   CONSULTANT:  Tenny Craw, M.D.   REASON FOR CONSULTATION:  Severe three-vessel coronary disease with acute  DMI.   CHIEF COMPLAINT:  Chest pain.   HISTORY OF PRESENT ILLNESS:  I was asked to evaluate this 69 year old obese  white female diabetic for potential surgical coronary revascularization for  recently diagnosed severe three-vessel coronary disease. The patient had her  first cardiac catheterization today by Dr. Tamala Julian. She was admitted to the  hospital yesterday after experiencing acute onset of chest pain. This was  described as substernal with squeezing pressure in her anterior chest  without radiation. It was associated with diaphoresis and nausea but no  dyspnea. There is no unusual factors preceding the onset of her pain which  occurred at rest while at her home. It persisted for 30 minutes and then  decreased in intensity. When it recurred, she went to the [facility]  Emergency Department where cardiac enzymes demonstrated acute myocardial  injury and she was transferred to [facility]. She was  admitted to [facility] last night and placed on  Integrilin, heparin and nitroglycerin. The patient has no history of prior  cardiac disease but does have a number of risk factors including  hypertension, hyperlipidemia, diabetes, and a positive family history. She  is a nonsmoker.   Following her admission, she was stabilized. She underwent cardiac  catheterization today which demonstrated a  95-99% stenosis of the right  coronary which was felt to be the culprit lesion with diffuse disease in the  small posterior descending vessel distal to the high-grade stenosis. She  also had 80-90% stenosis of the LAD, diagonal, ramus and circumflex vessels.  Her EF was 45% with an inferior hypokinesia and the left ventricular end-  diastolic pressure was 17 mmHg. She did not have evidence of mitral  regurgitation or aortic stenosis on cardiac catheterization. Based on the  patient's coronary anatomy, a surgical evaluation was requested.   PAST MEDICAL HISTORY:  1.  Diabetes mellitus.  2.  Hypertension.  3.  Hyperlipidemia.  4.  Status post hip replacement and cholecystectomy.  5.  No known allergies.   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 She does not smoke or use alcohol.   FAMILY HISTORY:  Positive for coronary disease and a myocardial infarction.  Of note, her husband had heart bypass surgery in year 2000. His most recent  cardiac catheterization showed his grafts to be patent; however, he did  develop a sternal wound infection at the time of surgery and the patient is  very anxious about having surgery for that reason.   REVIEW OF SYSTEMS:  Constitutional review is negative for fever, weight  loss. ENT review is negative change in vision, difficulty swallowing or  active dental problems. Thoracic is negative for thoracic trauma or history  of abnormal chest x-ray. Cardiac review is positive for her DMI and class IV  unstable angina. GI review is negative for change in bowel habits or blood  per rectum. Urologic review is negative for UTI or kidney stones. Endocrine  review is positive diabetes and negative for thyroid disease. Neurologic  review is negative for stroke or seizure. Vascular review is negative for  DVT, claudication or TIA.   PHYSICAL EXAMINATION:  VITAL SIGNS:  The patient is 5 foot 5 and weighs 210  pounds. Blood  pressure is 100/74, pulse 88, respirations 18, temperature  98.9.  GENERAL APPEARANCE:  That of a pleasant middle-aged white female in the  intensive care unit following cardiac catheterization in no distress. She is  accompanied by her two sons.  HEENT:  Exam is normocephalic. Pharynx clear.  NECK:  Without JVD, mass or carotid bruit. Lymphatics reveal no palpable  cervical or axillary adenopathy.  LUNGS:  Her lungs are clear.  CARDIAC:  Regular rhythm without S3, gallop, murmur, or rub.  ABDOMEN:  Obese, nontender without organomegaly or focal tenderness.  EXTREMITIES:  Reveal no clubbing, cyanosis or edema. -  VASCULAR:  Exam reveals 2+ pulses in the extremities. There is no evidence  of venous insufficiency of the lower extremities.  SKIN:  Without rash or lesion.  NEUROLOGIC:  Exam is alert and oriented without focal motor deficit.   LABORATORY DATA:  I reviewed the coronary arteriograms with Dr. Tamala Julian. She  has a high-grade culprit lesion of the right coronary with persistent flow  through high-grade 95-99% lesion. She also has moderate to severe three-  vessel disease in her LAD, circumflex and intermediate circulation. Her CPK-  MB is 115. Troponin is greater than 100. Chest x-ray shows some basilar  atelectasis, no significant edema.   IMPRESSION:  1.  Acute distal myocardial infarction.  2.  Hypertension.  3.  Hyperlipidemia.  4.  Diabetes mellitus.   RECOMMENDATIONS:  The patient has the options of percutaneous intervention  versus surgical revascularization. Dr. Tamala Julian and I discussed surgical  revascularization would be the most complete and best long-term therapy for  her coronary disease. However, the patient has strong preference to avoid  bypass surgery and would prefer percutaneous intervention of the right  coronary as a second best alternative and follow-up medical therapy of her  remaining coronary disease. I have reviewed the bypass surgery with the patient,  however, she is extremely familiar with the procedure and recovery  from her husband's protracted recovery after his operation 6 years ago.   I told the patient that if she would reconsider or have further questions  about bypass surgery I would be happy to see here in the future at any time.  Thank you very much for the consultation.       PV/MEDQ  D:  05/21/2005  T:  05/22/2005  Job:  [id number]

## 2011-03-01 NOTE — Cardiovascular Report (Signed)
[name], [name]              ACCOUNT NO.:  [id number]   MEDICAL RECORD NO.:  [id number]          PATIENT TYPE:  INP   LOCATION:  2313                         FACILITY:  [facility]   PHYSICIAN:  Belva Crome, M.D.   DATE OF BIRTH:  Oct 14, 1941   DATE OF PROCEDURE:  05/21/2005  DATE OF DISCHARGE:                              CARDIAC CATHETERIZATION   REASON FOR ADMISSION:  Aborted inferior wall myocardial infarction.   PROCEDURE PERFORMED:  1.  Left heart catheterization.  2.  Selective coronary angiogram.  3.  Left ventriculography.   DESCRIPTION:  After informed consent, a 6-French sheath was placed in the  right femoral artery using modified Seldinger technique. A 6-French A2  multipurpose catheter was then used for hemodynamic recordings, left  ventriculography by hand injection, selective left and right coronary  angiography. The patient tolerated the procedure without significant  complications.   RESULTS:   HEMODYNAMIC DATA:  1.  The aortic pressure 128/15.  2.  Aortic pressure 115/60.   LEFT VENTRICULOGRAPHY:  Left ventricle was normal in size. There was  moderate to severe inferobasal hypokinesis. EF was greater than 50%. No MR.   CORONARY ANGIOGRAPHY:  1.  Left main coronary: Left main is widely patent.  2.  Left anterior descending coronary: Left anterior descending coronary is      a large vessel gives origin to a large diagonal. The LAD beyond the      first diagonal and first septal perforator is diffusely diseased with up      to 90% stenosis within generally segmentally involved midsegment. The      first diagonal contains 75% proximal stenosis. The vessel is diffusely      disease with 90% midstenosis and may not be graftable.  3.  Circumflex artery: The circumflex coronary artery is relatively small,      diffusely diseased and gives two small obtuse marginal branches on the      inferolateral wall.  4.  Ramus branch: A large branching ramus artery  arises from the distal left      main. It immediately bifurcates. The larger branch also bifurcates. The      midportion contains a 70% stenosis. There is diffuse disease in the      smaller branch.  5.  Right coronary: The right coronary artery is dominant vessel, severely      diffusely diseased in the PDA and LV branches. There is a focal 99%      thrombus containing stenosis in the mid to distal vessel beyond a      segmental 50% narrowing in the right coronary.   CONCLUSION:  1.  Severe three-vessel coronary disease with diffuse involvement as would      be seen in a diabetic. The distal right coronary is diffusely diseased      and may not be graftable. The first diagonal is diffusely diseased and      may not be graftable. The distal circumflex is diffusely diseased and      may not be graftable. The ramus intermedius branch is graftable as is  the left anterior descending artery.  2.  Left ventricular dysfunction as described above.   PLAN:  Consider surgical revascularization versus culprit PCI. We will  discuss with CVTS and plan approach based upon their recommendations and  further discussion with the patient and family.      Belva Crome, M.D.  Electronically Signed     HWS/MEDQ  D:  05/21/2005  T:  05/22/2005  Job:  [id number]   cc:   Percell Miller L. Luan Pulling, M.D.  [address]  [place]  [place] [address]  Fax: [phone]   CVTS   Dalene Carrow, M.D.  [facility]

## 2011-07-10 LAB — CBC
HCT: 34.5 — ABNORMAL LOW
HCT: 39.2
Hemoglobin: 12
Hemoglobin: 13.8
MCHC: 34.9
MCHC: 35.2
MCV: 87.1
MCV: 87.6
Platelets: 262
Platelets: 305
RBC: 3.96
RBC: 4.48
RDW: 13.1
RDW: 13.3
WBC: 5.9
WBC: 6.7

## 2011-07-10 LAB — DIFFERENTIAL
Basophils Absolute: 0.1
Basophils Relative: 1
Eosinophils Absolute: 0.1
Eosinophils Relative: 2
Lymphocytes Relative: 33
Lymphs Abs: 2
Monocytes Absolute: 0.4
Monocytes Relative: 7
Neutro Abs: 3.3
Neutrophils Relative %: 56

## 2011-07-10 LAB — BASIC METABOLIC PANEL
BUN: 23
BUN: 28 — ABNORMAL HIGH
CO2: 27
CO2: 28
Calcium: 8.9
Calcium: 9.4
Chloride: 103
Chloride: 107
Creatinine, Ser: 0.98
Creatinine, Ser: 1.08
GFR calc Af Amer: >60
GFR calc Af Amer: >60
GFR calc non Af Amer: 51 — ABNORMAL LOW
GFR calc non Af Amer: 57 — ABNORMAL LOW
Glucose, Bld: 131 — ABNORMAL HIGH
Glucose, Bld: 164 — ABNORMAL HIGH
Potassium: 3.8
Potassium: 4.3
Sodium: 139
Sodium: 140

## 2011-07-10 LAB — PROTIME-INR
INR: 0.9
Prothrombin Time: 12.8

## 2011-07-10 LAB — APTT: aPTT: 27

## 2011-07-12 LAB — GLUCOSE, CAPILLARY: Glucose-Capillary: 143 — ABNORMAL HIGH

## 2011-07-15 LAB — STOOL CULTURE

## 2011-07-15 LAB — GLUCOSE, CAPILLARY
Glucose-Capillary: 126 — ABNORMAL HIGH
Glucose-Capillary: 139 — ABNORMAL HIGH
Glucose-Capillary: 159 — ABNORMAL HIGH
Glucose-Capillary: 243 — ABNORMAL HIGH

## 2011-07-15 LAB — HEPATIC FUNCTION PANEL
ALT: 18
AST: 25
Albumin: 3 — ABNORMAL LOW
Alkaline Phosphatase: 58
Bilirubin, Direct: 0.2
Indirect Bilirubin: 0.7
Total Bilirubin: 0.9
Total Protein: 6.5

## 2011-07-15 LAB — BASIC METABOLIC PANEL
BUN: 30 — ABNORMAL HIGH
CO2: 25
Calcium: 8.9
Chloride: 95 — ABNORMAL LOW
Creatinine, Ser: 2.64 — ABNORMAL HIGH
GFR calc Af Amer: 22 — ABNORMAL LOW
GFR calc non Af Amer: 18 — ABNORMAL LOW
Glucose, Bld: 294 — ABNORMAL HIGH
Potassium: 3.2 — ABNORMAL LOW
Sodium: 131 — ABNORMAL LOW

## 2011-07-15 LAB — FECAL LACTOFERRIN, QUANT: Fecal Lactoferrin: POSITIVE

## 2011-07-15 LAB — DIFFERENTIAL
Basophils Absolute: 0
Basophils Relative: 1
Eosinophils Absolute: 0
Eosinophils Relative: 0
Lymphocytes Relative: 12
Lymphs Abs: 1.1
Monocytes Absolute: 1.2 — ABNORMAL HIGH
Monocytes Relative: 12
Neutro Abs: 7.3
Neutrophils Relative %: 76

## 2011-07-15 LAB — CBC
HCT: 38.4
Hemoglobin: 13.2
MCHC: 34.4
MCV: 88.1
Platelets: 251
RBC: 4.36
RDW: 12.7
WBC: 9.4

## 2011-07-15 LAB — CLOSTRIDIUM DIFFICILE EIA: C difficile Toxins A+B, EIA: NEGATIVE

## 2011-07-15 LAB — OCCULT BLOOD X 1 CARD TO LAB, STOOL: Fecal Occult Bld: POSITIVE

## 2011-07-16 LAB — BASIC METABOLIC PANEL
BUN: 27 — ABNORMAL HIGH
BUN: 3 — ABNORMAL LOW
BUN: 4 — ABNORMAL LOW
BUN: 5 — ABNORMAL LOW
BUN: 7
CO2: 21
CO2: 23
CO2: 23
CO2: 24
CO2: 25
Calcium: 7.9 — ABNORMAL LOW
Calcium: 8 — ABNORMAL LOW
Calcium: 8 — ABNORMAL LOW
Calcium: 8.2 — ABNORMAL LOW
Calcium: 8.3 — ABNORMAL LOW
Chloride: 106
Chloride: 107
Chloride: 108
Chloride: 108
Chloride: 110
Creatinine, Ser: 0.9
Creatinine, Ser: 0.91
Creatinine, Ser: 0.93
Creatinine, Ser: 1.01
Creatinine, Ser: 1.57 — ABNORMAL HIGH
GFR calc Af Amer: 40 — ABNORMAL LOW
GFR calc Af Amer: >60
GFR calc Af Amer: >60
GFR calc Af Amer: >60
GFR calc Af Amer: >60
GFR calc non Af Amer: 33 — ABNORMAL LOW
GFR calc non Af Amer: 55 — ABNORMAL LOW
GFR calc non Af Amer: 60 — ABNORMAL LOW
GFR calc non Af Amer: >60
GFR calc non Af Amer: >60
Glucose, Bld: 119 — ABNORMAL HIGH
Glucose, Bld: 131 — ABNORMAL HIGH
Glucose, Bld: 136 — ABNORMAL HIGH
Glucose, Bld: 140 — ABNORMAL HIGH
Glucose, Bld: 149 — ABNORMAL HIGH
Potassium: 2.6 — CL
Potassium: 3.1 — ABNORMAL LOW
Potassium: 3.4 — ABNORMAL LOW
Potassium: 3.5
Potassium: 4.5
Sodium: 135
Sodium: 137
Sodium: 138
Sodium: 139
Sodium: 140

## 2011-07-16 LAB — DIFFERENTIAL
Basophils Absolute: 0
Basophils Absolute: 0
Basophils Absolute: 0
Basophils Absolute: 0.1
Basophils Absolute: 0.1
Basophils Absolute: 0.1
Basophils Relative: 1
Basophils Relative: 1
Basophils Relative: 1
Basophils Relative: 1
Basophils Relative: 1
Basophils Relative: 2 — ABNORMAL HIGH
Eosinophils Absolute: 0
Eosinophils Absolute: 0.1
Eosinophils Absolute: 0.1
Eosinophils Absolute: 0.1
Eosinophils Absolute: 0.1
Eosinophils Absolute: 0.2
Eosinophils Relative: 1
Eosinophils Relative: 1
Eosinophils Relative: 2
Eosinophils Relative: 2
Eosinophils Relative: 3
Eosinophils Relative: 4
Lymphocytes Relative: 16
Lymphocytes Relative: 17
Lymphocytes Relative: 20
Lymphocytes Relative: 22
Lymphocytes Relative: 34
Lymphocytes Relative: 46
Lymphs Abs: 1.2
Lymphs Abs: 1.4
Lymphs Abs: 1.5
Lymphs Abs: 1.7
Lymphs Abs: 2
Lymphs Abs: 2.2
Monocytes Absolute: 0.6
Monocytes Absolute: 0.7
Monocytes Absolute: 0.7
Monocytes Absolute: 1.1 — ABNORMAL HIGH
Monocytes Absolute: 1.1 — ABNORMAL HIGH
Monocytes Absolute: 1.5 — ABNORMAL HIGH
Monocytes Relative: 10
Monocytes Relative: 11
Monocytes Relative: 13 — ABNORMAL HIGH
Monocytes Relative: 15 — ABNORMAL HIGH
Monocytes Relative: 16 — ABNORMAL HIGH
Monocytes Relative: 18 — ABNORMAL HIGH
Neutro Abs: 1.5 — ABNORMAL LOW
Neutro Abs: 2.4
Neutro Abs: 4.6
Neutro Abs: 4.6
Neutro Abs: 5.4
Neutro Abs: 7.1
Neutrophils Relative %: 35 — ABNORMAL LOW
Neutrophils Relative %: 49
Neutrophils Relative %: 64
Neutrophils Relative %: 65
Neutrophils Relative %: 66
Neutrophils Relative %: 67

## 2011-07-16 LAB — CLOSTRIDIUM DIFFICILE EIA: C difficile Toxins A+B, EIA: NEGATIVE

## 2011-07-16 LAB — GLUCOSE, CAPILLARY
Glucose-Capillary: 106 — ABNORMAL HIGH
Glucose-Capillary: 112 — ABNORMAL HIGH
Glucose-Capillary: 118 — ABNORMAL HIGH
Glucose-Capillary: 125 — ABNORMAL HIGH
Glucose-Capillary: 129 — ABNORMAL HIGH
Glucose-Capillary: 136 — ABNORMAL HIGH
Glucose-Capillary: 137 — ABNORMAL HIGH
Glucose-Capillary: 140 — ABNORMAL HIGH
Glucose-Capillary: 140 — ABNORMAL HIGH
Glucose-Capillary: 143 — ABNORMAL HIGH
Glucose-Capillary: 143 — ABNORMAL HIGH
Glucose-Capillary: 144 — ABNORMAL HIGH
Glucose-Capillary: 147 — ABNORMAL HIGH
Glucose-Capillary: 147 — ABNORMAL HIGH
Glucose-Capillary: 148 — ABNORMAL HIGH
Glucose-Capillary: 149 — ABNORMAL HIGH
Glucose-Capillary: 149 — ABNORMAL HIGH
Glucose-Capillary: 151 — ABNORMAL HIGH
Glucose-Capillary: 151 — ABNORMAL HIGH
Glucose-Capillary: 153 — ABNORMAL HIGH
Glucose-Capillary: 157 — ABNORMAL HIGH
Glucose-Capillary: 157 — ABNORMAL HIGH
Glucose-Capillary: 159 — ABNORMAL HIGH
Glucose-Capillary: 160 — ABNORMAL HIGH
Glucose-Capillary: 164 — ABNORMAL HIGH
Glucose-Capillary: 165 — ABNORMAL HIGH
Glucose-Capillary: 166 — ABNORMAL HIGH
Glucose-Capillary: 167 — ABNORMAL HIGH
Glucose-Capillary: 167 — ABNORMAL HIGH
Glucose-Capillary: 170 — ABNORMAL HIGH
Glucose-Capillary: 172 — ABNORMAL HIGH
Glucose-Capillary: 175 — ABNORMAL HIGH
Glucose-Capillary: 176 — ABNORMAL HIGH
Glucose-Capillary: 177 — ABNORMAL HIGH
Glucose-Capillary: 178 — ABNORMAL HIGH
Glucose-Capillary: 179 — ABNORMAL HIGH
Glucose-Capillary: 179 — ABNORMAL HIGH
Glucose-Capillary: 182 — ABNORMAL HIGH
Glucose-Capillary: 182 — ABNORMAL HIGH
Glucose-Capillary: 182 — ABNORMAL HIGH
Glucose-Capillary: 183 — ABNORMAL HIGH
Glucose-Capillary: 184 — ABNORMAL HIGH
Glucose-Capillary: 185 — ABNORMAL HIGH
Glucose-Capillary: 190 — ABNORMAL HIGH
Glucose-Capillary: 194 — ABNORMAL HIGH
Glucose-Capillary: 194 — ABNORMAL HIGH
Glucose-Capillary: 204 — ABNORMAL HIGH
Glucose-Capillary: 217 — ABNORMAL HIGH
Glucose-Capillary: 219 — ABNORMAL HIGH
Glucose-Capillary: 229 — ABNORMAL HIGH
Glucose-Capillary: 247 — ABNORMAL HIGH
Glucose-Capillary: 258 — ABNORMAL HIGH
Glucose-Capillary: 267 — ABNORMAL HIGH

## 2011-07-16 LAB — CBC
HCT: 28.2 — ABNORMAL LOW
HCT: 28.3 — ABNORMAL LOW
HCT: 30.1 — ABNORMAL LOW
HCT: 30.2 — ABNORMAL LOW
HCT: 33.1 — ABNORMAL LOW
HCT: 35.6 — ABNORMAL LOW
Hemoglobin: 10.3 — ABNORMAL LOW
Hemoglobin: 10.3 — ABNORMAL LOW
Hemoglobin: 11.3 — ABNORMAL LOW
Hemoglobin: 12.1
Hemoglobin: 9.4 — ABNORMAL LOW
Hemoglobin: 9.5 — ABNORMAL LOW
MCHC: 33.3
MCHC: 33.6
MCHC: 34.1
MCHC: 34.1
MCHC: 34.2
MCHC: 34.2
MCV: 87.7
MCV: 87.8
MCV: 87.8
MCV: 88
MCV: 88.5
MCV: 88.9
Platelets: 203
Platelets: 237
Platelets: 275
Platelets: 314
Platelets: 344
Platelets: 401 — ABNORMAL HIGH
RBC: 3.19 — ABNORMAL LOW
RBC: 3.21 — ABNORMAL LOW
RBC: 3.39 — ABNORMAL LOW
RBC: 3.43 — ABNORMAL LOW
RBC: 3.78 — ABNORMAL LOW
RBC: 4.06
RDW: 12.6
RDW: 12.9
RDW: 12.9
RDW: 13.1
RDW: 13.4
RDW: 13.5
WBC: 10.6 — ABNORMAL HIGH
WBC: 4.4
WBC: 4.9
WBC: 7
WBC: 7.1
WBC: 8.4

## 2011-07-16 LAB — D-DIMER, QUANTITATIVE: D-Dimer, Quant: 3.71 — ABNORMAL HIGH

## 2011-07-16 LAB — PROTIME-INR
INR: 1.1
INR: 1.1
INR: 1.1
INR: 1.2
Prothrombin Time: 14.5
Prothrombin Time: 14.8
Prothrombin Time: 14.9
Prothrombin Time: 15.5 — ABNORMAL HIGH

## 2011-07-17 LAB — GLUCOSE, CAPILLARY
Glucose-Capillary: 112 — ABNORMAL HIGH
Glucose-Capillary: 148 — ABNORMAL HIGH
Glucose-Capillary: 148 — ABNORMAL HIGH

## 2011-11-11 DIAGNOSIS — E785 Hyperlipidemia, unspecified: Secondary | ICD-10-CM | POA: Diagnosis not present

## 2011-11-11 DIAGNOSIS — IMO0001 Reserved for inherently not codable concepts without codable children: Secondary | ICD-10-CM | POA: Diagnosis not present

## 2011-11-11 DIAGNOSIS — I10 Essential (primary) hypertension: Secondary | ICD-10-CM | POA: Diagnosis not present

## 2012-02-17 DIAGNOSIS — IMO0001 Reserved for inherently not codable concepts without codable children: Secondary | ICD-10-CM | POA: Diagnosis not present

## 2012-02-17 DIAGNOSIS — I10 Essential (primary) hypertension: Secondary | ICD-10-CM | POA: Diagnosis not present

## 2012-02-17 DIAGNOSIS — I2510 Atherosclerotic heart disease of native coronary artery without angina pectoris: Secondary | ICD-10-CM | POA: Diagnosis not present

## 2012-03-02 ENCOUNTER — Encounter (INDEPENDENT_AMBULATORY_CARE_PROVIDER_SITE_OTHER): Payer: Medicare Other | Admitting: Ophthalmology

## 2012-03-02 DIAGNOSIS — I10 Essential (primary) hypertension: Secondary | ICD-10-CM

## 2012-03-02 DIAGNOSIS — H2510 Age-related nuclear cataract, unspecified eye: Secondary | ICD-10-CM

## 2012-03-02 DIAGNOSIS — E1139 Type 2 diabetes mellitus with other diabetic ophthalmic complication: Secondary | ICD-10-CM

## 2012-03-02 DIAGNOSIS — H35039 Hypertensive retinopathy, unspecified eye: Secondary | ICD-10-CM | POA: Diagnosis not present

## 2012-03-02 DIAGNOSIS — H43819 Vitreous degeneration, unspecified eye: Secondary | ICD-10-CM | POA: Diagnosis not present

## 2012-03-02 DIAGNOSIS — H3530 Unspecified macular degeneration: Secondary | ICD-10-CM

## 2012-03-02 DIAGNOSIS — E11319 Type 2 diabetes mellitus with unspecified diabetic retinopathy without macular edema: Secondary | ICD-10-CM

## 2012-03-17 ENCOUNTER — Ambulatory Visit (INDEPENDENT_AMBULATORY_CARE_PROVIDER_SITE_OTHER): Payer: Medicare Other | Admitting: Orthopedic Surgery

## 2012-03-17 ENCOUNTER — Ambulatory Visit (INDEPENDENT_AMBULATORY_CARE_PROVIDER_SITE_OTHER): Payer: Medicare Other

## 2012-03-17 ENCOUNTER — Encounter: Payer: Self-pay | Admitting: Orthopedic Surgery

## 2012-03-17 VITALS — Ht 65.0 in | Wt 180.0 lb

## 2012-03-17 DIAGNOSIS — M25559 Pain in unspecified hip: Secondary | ICD-10-CM

## 2012-03-17 DIAGNOSIS — M25551 Pain in right hip: Secondary | ICD-10-CM

## 2012-03-17 NOTE — Progress Notes (Signed)
Subjective:     Patient ID: [name], female   DOB: 11/02/1940, 71 y.o.   MRN: [id number] Chief Complaint  Patient presents with  . Hip Pain    Right hip pain.     Hip Pain    the patient had a open treatment internal fixation of the right hip back and 2000 for right intertrochanteric hip fracture. This is treated with a long plate and a long screw. She started having some hip pain think she may have transferred weight onto the left lower extremity which resulted in knee pain and she made appointment to have her knee checked out in the interim once the weather, down from the frequent rains she has not had any knee pain.  She is able to garden do most her activities of daily living as long as she uses a cane to take pressure off of her right hip. Her symptoms started about 2 weeks ago came on gradually but again have subsided. She is improvement she is not walking and when she is sitting her symptoms are worse with walking. She denies any numbness tingling locking catching of the right hip or left knee  Review of systems is significant for heart palpitations snoring frequency joint pain stiffness numbness dizziness seasonal allergies. She denies unexpected weight loss or weight gain blurred vision heartburn changes in her skin rash itching nervousness anxiety easy bleeding excessive thirst.     Review of Systems     Objective:   Physical Exam Ht 5\' 5"  (1.651 m)  Wt 180 lb (81.647 kg)  BMI 29.95 kg/m2 Overall appearance is normal she has some mild increase in her right mass index. Orientation x3 his normal mood and affect are normal. She's infiltrate with a cane in her right hand and a slight limp which has been chronic.  She has normal range of motion in her left hip no tenderness. The left hip is stable strength is normal skin is intact left knee range of motion strength stability are normal there is no joint effusion  The right hip has essentially no internal rotation,  excessive external rotation. No instability. No tenderness over the trochanter. Skin is intact incision is well-healed no swelling.  Pulses and temperature are normal there is no lymphadenopathy in the lower chest remedy superior to is normal distal sensation no pathologic reflexes and her balance is pretty good on the cane.  Upper extremity exam  Inspection and palpation revealed no abnormalities in the upper extremities.  Range of motion is full without contracture.  Motor exam is normal with grade 5 strength.  The joints are fully reduced without subluxation.  There is no atrophy or tremor and muscle tone is normal.  All joints are stable.     Assessment:     X-rays were done of her hip and it shows end-stage arthritis no progression since her last film which was over a year ago. She's asymptomatic as long as she uses a cane so at this point we will continue with our current management which is basically waiting for her pain to get worse    Plan:     Followup as needed continue cane as needed

## 2012-03-17 NOTE — Patient Instructions (Signed)
activities as tolerated 

## 2012-04-27 DIAGNOSIS — I10 Essential (primary) hypertension: Secondary | ICD-10-CM | POA: Diagnosis not present

## 2012-04-27 DIAGNOSIS — I2510 Atherosclerotic heart disease of native coronary artery without angina pectoris: Secondary | ICD-10-CM | POA: Diagnosis not present

## 2012-04-27 DIAGNOSIS — E782 Mixed hyperlipidemia: Secondary | ICD-10-CM | POA: Diagnosis not present

## 2012-05-04 DIAGNOSIS — E782 Mixed hyperlipidemia: Secondary | ICD-10-CM | POA: Diagnosis not present

## 2012-05-04 DIAGNOSIS — Z79899 Other long term (current) drug therapy: Secondary | ICD-10-CM | POA: Diagnosis not present

## 2012-05-19 DIAGNOSIS — E785 Hyperlipidemia, unspecified: Secondary | ICD-10-CM | POA: Diagnosis not present

## 2012-05-19 DIAGNOSIS — I10 Essential (primary) hypertension: Secondary | ICD-10-CM | POA: Diagnosis not present

## 2012-05-19 DIAGNOSIS — I259 Chronic ischemic heart disease, unspecified: Secondary | ICD-10-CM | POA: Diagnosis not present

## 2012-05-19 DIAGNOSIS — E109 Type 1 diabetes mellitus without complications: Secondary | ICD-10-CM | POA: Diagnosis not present

## 2012-06-23 DIAGNOSIS — I259 Chronic ischemic heart disease, unspecified: Secondary | ICD-10-CM | POA: Diagnosis not present

## 2012-06-23 DIAGNOSIS — I10 Essential (primary) hypertension: Secondary | ICD-10-CM | POA: Diagnosis not present

## 2012-06-23 DIAGNOSIS — E785 Hyperlipidemia, unspecified: Secondary | ICD-10-CM | POA: Diagnosis not present

## 2012-06-23 DIAGNOSIS — E109 Type 1 diabetes mellitus without complications: Secondary | ICD-10-CM | POA: Diagnosis not present

## 2012-08-18 DIAGNOSIS — E785 Hyperlipidemia, unspecified: Secondary | ICD-10-CM | POA: Diagnosis not present

## 2012-08-18 DIAGNOSIS — I10 Essential (primary) hypertension: Secondary | ICD-10-CM | POA: Diagnosis not present

## 2012-08-18 DIAGNOSIS — I259 Chronic ischemic heart disease, unspecified: Secondary | ICD-10-CM | POA: Diagnosis not present

## 2012-08-18 DIAGNOSIS — E109 Type 1 diabetes mellitus without complications: Secondary | ICD-10-CM | POA: Diagnosis not present

## 2012-08-25 DIAGNOSIS — Z23 Encounter for immunization: Secondary | ICD-10-CM | POA: Diagnosis not present

## 2012-08-25 DIAGNOSIS — I10 Essential (primary) hypertension: Secondary | ICD-10-CM | POA: Diagnosis not present

## 2012-08-25 DIAGNOSIS — E109 Type 1 diabetes mellitus without complications: Secondary | ICD-10-CM | POA: Diagnosis not present

## 2012-08-25 DIAGNOSIS — E785 Hyperlipidemia, unspecified: Secondary | ICD-10-CM | POA: Diagnosis not present

## 2012-08-25 DIAGNOSIS — I259 Chronic ischemic heart disease, unspecified: Secondary | ICD-10-CM | POA: Diagnosis not present

## 2012-11-25 DIAGNOSIS — I259 Chronic ischemic heart disease, unspecified: Secondary | ICD-10-CM | POA: Diagnosis not present

## 2012-11-25 DIAGNOSIS — I10 Essential (primary) hypertension: Secondary | ICD-10-CM | POA: Diagnosis not present

## 2012-11-25 DIAGNOSIS — E109 Type 1 diabetes mellitus without complications: Secondary | ICD-10-CM | POA: Diagnosis not present

## 2012-11-25 DIAGNOSIS — E785 Hyperlipidemia, unspecified: Secondary | ICD-10-CM | POA: Diagnosis not present

## 2013-02-23 DIAGNOSIS — I259 Chronic ischemic heart disease, unspecified: Secondary | ICD-10-CM | POA: Diagnosis not present

## 2013-02-23 DIAGNOSIS — I110 Hypertensive heart disease with heart failure: Secondary | ICD-10-CM | POA: Diagnosis not present

## 2013-02-23 DIAGNOSIS — E109 Type 1 diabetes mellitus without complications: Secondary | ICD-10-CM | POA: Diagnosis not present

## 2013-02-23 DIAGNOSIS — E785 Hyperlipidemia, unspecified: Secondary | ICD-10-CM | POA: Diagnosis not present

## 2013-02-23 DIAGNOSIS — I509 Heart failure, unspecified: Secondary | ICD-10-CM | POA: Diagnosis not present

## 2013-03-02 ENCOUNTER — Emergency Department (HOSPITAL_COMMUNITY)
Admission: EM | Admit: 2013-03-02 | Discharge: 2013-03-02 | Disposition: A | Discharge status: Home / Self Care | Payer: Medicare Other | Attending: Emergency Medicine | Admitting: Emergency Medicine

## 2013-03-02 ENCOUNTER — Ambulatory Visit (INDEPENDENT_AMBULATORY_CARE_PROVIDER_SITE_OTHER): Payer: Medicare Other | Admitting: Ophthalmology

## 2013-03-02 ENCOUNTER — Emergency Department (HOSPITAL_COMMUNITY): Payer: Medicare Other

## 2013-03-02 ENCOUNTER — Other Ambulatory Visit (HOSPITAL_COMMUNITY): Payer: Self-pay | Admitting: Pulmonary Disease

## 2013-03-02 ENCOUNTER — Other Ambulatory Visit: Payer: Self-pay

## 2013-03-02 ENCOUNTER — Encounter (HOSPITAL_COMMUNITY): Payer: Self-pay | Admitting: *Deleted

## 2013-03-02 DIAGNOSIS — H35039 Hypertensive retinopathy, unspecified eye: Secondary | ICD-10-CM

## 2013-03-02 DIAGNOSIS — I2510 Atherosclerotic heart disease of native coronary artery without angina pectoris: Secondary | ICD-10-CM | POA: Insufficient documentation

## 2013-03-02 DIAGNOSIS — H539 Unspecified visual disturbance: Secondary | ICD-10-CM

## 2013-03-02 DIAGNOSIS — Z7902 Long term (current) use of antithrombotics/antiplatelets: Secondary | ICD-10-CM | POA: Insufficient documentation

## 2013-03-02 DIAGNOSIS — I10 Essential (primary) hypertension: Secondary | ICD-10-CM | POA: Diagnosis not present

## 2013-03-02 DIAGNOSIS — E860 Dehydration: Secondary | ICD-10-CM | POA: Insufficient documentation

## 2013-03-02 DIAGNOSIS — E1165 Type 2 diabetes mellitus with hyperglycemia: Secondary | ICD-10-CM | POA: Diagnosis not present

## 2013-03-02 DIAGNOSIS — I252 Old myocardial infarction: Secondary | ICD-10-CM | POA: Insufficient documentation

## 2013-03-02 DIAGNOSIS — H34219 Partial retinal artery occlusion, unspecified eye: Secondary | ICD-10-CM | POA: Diagnosis not present

## 2013-03-02 DIAGNOSIS — Z8679 Personal history of other diseases of the circulatory system: Secondary | ICD-10-CM | POA: Insufficient documentation

## 2013-03-02 DIAGNOSIS — I6523 Occlusion and stenosis of bilateral carotid arteries: Secondary | ICD-10-CM

## 2013-03-02 DIAGNOSIS — H538 Other visual disturbances: Secondary | ICD-10-CM | POA: Diagnosis not present

## 2013-03-02 DIAGNOSIS — E11319 Type 2 diabetes mellitus with unspecified diabetic retinopathy without macular edema: Secondary | ICD-10-CM

## 2013-03-02 DIAGNOSIS — H43819 Vitreous degeneration, unspecified eye: Secondary | ICD-10-CM

## 2013-03-02 DIAGNOSIS — E1139 Type 2 diabetes mellitus with other diabetic ophthalmic complication: Secondary | ICD-10-CM | POA: Diagnosis not present

## 2013-03-02 DIAGNOSIS — Z79899 Other long term (current) drug therapy: Secondary | ICD-10-CM | POA: Diagnosis not present

## 2013-03-02 DIAGNOSIS — E119 Type 2 diabetes mellitus without complications: Secondary | ICD-10-CM | POA: Diagnosis not present

## 2013-03-02 HISTORY — DX: Essential (primary) hypertension: I10

## 2013-03-02 HISTORY — DX: Acute myocardial infarction, unspecified: I21.9

## 2013-03-02 HISTORY — DX: Type 2 diabetes mellitus without complications: E11.9

## 2013-03-02 LAB — COMPREHENSIVE METABOLIC PANEL
ALT: 11 U/L (ref 0–35)
AST: 16 U/L (ref 0–37)
Albumin: 3.7 g/dL (ref 3.5–5.2)
Alkaline Phosphatase: 58 U/L (ref 39–117)
BUN: 39 mg/dL — ABNORMAL HIGH (ref 6–23)
CO2: 28 mEq/L (ref 19–32)
Calcium: 9.6 mg/dL (ref 8.4–10.5)
Chloride: 102 mEq/L (ref 96–112)
Creatinine, Ser: 1.58 mg/dL — ABNORMAL HIGH (ref 0.50–1.10)
GFR calc Af Amer: 37 mL/min — ABNORMAL LOW (ref >90)
GFR calc non Af Amer: 32 mL/min — ABNORMAL LOW (ref >90)
Glucose, Bld: 128 mg/dL — ABNORMAL HIGH (ref 70–99)
Potassium: 4.6 mEq/L (ref 3.5–5.1)
Sodium: 139 mEq/L (ref 135–145)
Total Bilirubin: 0.6 mg/dL (ref 0.3–1.2)
Total Protein: 7 g/dL (ref 6.0–8.3)

## 2013-03-02 LAB — CBC WITH DIFFERENTIAL/PLATELET
Basophils Absolute: 0.1 10*3/uL (ref 0.0–0.1)
Basophils Relative: 1 % (ref 0–1)
Eosinophils Absolute: 0.1 10*3/uL (ref 0.0–0.7)
Eosinophils Relative: 1 % (ref 0–5)
HCT: 36.4 % (ref 36.0–46.0)
Hemoglobin: 12 g/dL (ref 12.0–15.0)
Lymphocytes Relative: 52 % — ABNORMAL HIGH (ref 12–46)
Lymphs Abs: 2.6 10*3/uL (ref 0.7–4.0)
MCH: 29.5 pg (ref 26.0–34.0)
MCHC: 33 g/dL (ref 30.0–36.0)
MCV: 89.4 fL (ref 78.0–100.0)
Monocytes Absolute: 0.4 10*3/uL (ref 0.1–1.0)
Monocytes Relative: 7 % (ref 3–12)
Neutro Abs: 1.9 10*3/uL (ref 1.7–7.7)
Neutrophils Relative %: 39 % — ABNORMAL LOW (ref 43–77)
Platelets: 317 10*3/uL (ref 150–400)
RBC: 4.07 MIL/uL (ref 3.87–5.11)
RDW: 13.2 % (ref 11.5–15.5)
WBC: 5 10*3/uL (ref 4.0–10.5)

## 2013-03-02 NOTE — ED Notes (Signed)
Pt states she saw Dr. Luan Pulling this past week for routine physical which was all wnl. Saw her ophthalmologist today for a routine yearly exam and while waiting for her exam started having blurred vision. (new onset), prior to any eye drops. Eye Dr. Hulen Skains PMD after her exam and suggested a carotid study be ordered. Dr Luan Pulling set one up for tomorrow @ 4pm. Pt's husband worried that she's having a stroke and wants the study done tonight. Pt denies any other symptoms other than persistent blurry vision.

## 2013-03-02 NOTE — ED Notes (Signed)
No change. Relaxed. Vision same

## 2013-03-02 NOTE — ED Notes (Signed)
Blurred vision , onset this morning  After eye exam . Dr Zigmund Daniel. Said she had plaque in her eye.

## 2013-03-02 NOTE — ED Provider Notes (Addendum)
History  This chart was scribed for Caitlin Diego, MD by Jenne Campus, ED Scribe. This patient was seen in room APA19/APA19 and the patient's care was started at 8:02 PM.  CSN: [id number]  Arrival date & time 03/02/13  1857   First MD Initiated Contact with Patient 03/02/13 2002      Chief Complaint  Patient presents with  . Blurred Vision    The history is provided by the patient. No language interpreter was used.   HPI Comments: [name] is a 72 y.o. female who presents to the Emergency Department complaining of sudden onset, non-changing, constant bilateral blurred vision that started 11 hours ago while in the waiting room at her ophthalmologist's office. She was evaluated by Dr. Zigmund Daniel, ophthalmologist, and was told that she had plaques in her eyes that comes from CAD. Dr. Zigmund Daniel called her PCP and made arrangements to have her carotid arteries checked. She states that she is in the ED tonight for the ongoing blurry vision. She denies HA, weakness and eye pain as associated symptoms. She has a h/o CAD, MI, HTN and DM. Pt denies smoking and alcohol use.  She reports that she has experienced ongoing leakage in her eyes for which she follows up with the ophthalmologist.  Past Medical History  Diagnosis Date  . Heart disease   . Myocardial infarction   . Diabetes mellitus without complication   . Hypertension     Past Surgical History  Procedure Laterality Date  . Cholecystectomy    . Hip surgery      Family History  Problem Relation Age of Onset  . Heart disease    . Arthritis    . Cancer    . Diabetes      History  Substance Use Topics  . Smoking status: Never Smoker   . Smokeless tobacco: Not on file  . Alcohol Use: No    No OB history provided.  Review of Systems  Constitutional: Negative for appetite change and fatigue.  HENT: Negative for congestion, sinus pressure and ear discharge.   Eyes: Positive for visual disturbance. Negative for  discharge.  Respiratory: Negative for cough.   Cardiovascular: Negative for chest pain.  Gastrointestinal: Negative for abdominal pain and diarrhea.  Genitourinary: Negative for frequency and hematuria.  Musculoskeletal: Negative for back pain.  Skin: Negative for rash.  Neurological: Negative for seizures and headaches.  Psychiatric/Behavioral: Negative for hallucinations.    Allergies  Review of patient's allergies indicates no known allergies.  Home Medications   Current Outpatient Rx  Name  Route  Sig  Dispense  Refill  . amLODipine (NORVASC) 5 MG tablet   Oral   Take 5 mg by mouth daily.         . clopidogrel (PLAVIX) 75 MG tablet   Oral   Take 75 mg by mouth daily.         . enalapril (VASOTEC) 10 MG tablet   Oral   Take 10 mg by mouth daily.         Marland Kitchen glipiZIDE (GLUCOTROL) 10 MG tablet   Oral   Take 10 mg by mouth 2 (two) times daily before a meal.         . hydrochlorothiazide (HYDRODIURIL) 25 MG tablet   Oral   Take 25 mg by mouth daily.         . metFORMIN (GLUCOPHAGE) 500 MG tablet   Oral   Take 500 mg by mouth 2 (two) times daily with  a meal.         . metoprolol (LOPRESSOR) 50 MG tablet   Oral   Take 50 mg by mouth 2 (two) times daily.           Triage Vitals: BP 168/76  Pulse 87  Temp(Src) 97.6 F (36.4 C) (Oral)  Resp 18  Ht 5\' 5"  (1.651 m)  Wt 170 lb (77.111 kg)  BMI 28.29 kg/m2  SpO2 100%  Physical Exam  Nursing note and vitals reviewed. Constitutional: She is oriented to person, place, and time. She appears well-developed and well-nourished.  HENT:  Head: Normocephalic and atraumatic.  Eyes: Conjunctivae and EOM are normal. Pupils are equal, round, and reactive to light. No scleral icterus.  Normal funduscopic exam   Neck: Neck supple. No thyromegaly present.  Cardiovascular: Normal rate and regular rhythm.  Exam reveals no gallop and no friction rub.   No murmur heard. Pulmonary/Chest: Effort normal and breath sounds  normal. No stridor. She has no wheezes. She has no rales. She exhibits no tenderness.  Abdominal: She exhibits no distension. There is no tenderness. There is no rebound.  Musculoskeletal: Normal range of motion. She exhibits no edema.  Lymphadenopathy:    She has no cervical adenopathy.  Neurological: She is alert and oriented to person, place, and time. Coordination normal.  Skin: Skin is warm and dry. No rash noted. No erythema.  Psychiatric: She has a normal mood and affect. Her behavior is normal.    ED Course  Procedures (including critical care time)  DIAGNOSTIC STUDIES: Oxygen Saturation is 100% on room air, normal by my interpretation.    COORDINATION OF CARE: 8:14 PM-Discussed treatment plan which includes CT of head, CBC panel and CMP with pt at bedside and pt agreed to plan.   Labs Reviewed  CBC WITH DIFFERENTIAL - Abnormal; Notable for the following:    Neutrophils Relative % 39 (*)    Lymphocytes Relative 52 (*)    All other components within normal limits  COMPREHENSIVE METABOLIC PANEL - Abnormal; Notable for the following:    Glucose, Bld 128 (*)    BUN 39 (*)    Creatinine, Ser 1.58 (*)    GFR calc non Af Amer 32 (*)    GFR calc Af Amer 37 (*)    All other components within normal limits   Ct Head Wo Contrast  03/02/2013   *RADIOLOGY REPORT*  Clinical Data: Blurred vision.  CT HEAD WITHOUT CONTRAST  Technique:  Contiguous axial images were obtained from the base of the skull through the vertex without contrast.  Comparison: None  Findings: The ventricles are normal.  No extra-axial fluid collections are seen.  The brainstem and cerebellum are unremarkable.  No acute intracranial findings such as infarction or hemorrhage.  No mass lesions. Moderate vascular calcifications are noted.  The bony calvarium is intact.  The visualized paranasal sinuses and mastoid air cells are clear.  IMPRESSION: No acute intracranial findings or mass lesions.   Original Report  Authenticated By: Marijo Sanes, M.D.     No diagnosis found.    Date: 03/02/2013  Rate 81  Rhythm: normal sinus rhythm  QRS Axis: normal  Intervals: normal  ST/T Wave abnormalities: nonspecific ST changes  Conduction Disutrbances:none  Narrative Interpretation:   Old EKG Reviewed: unchanged    MDM        The chart was scribed for me under my direct supervision.  I personally performed the history, physical, and medical decision making and all  procedures in the evaluation of this patient.Caitlin Diego, MD 03/02/13 2232  Caitlin Diego, MD 03/02/13 2233

## 2013-03-02 NOTE — ED Notes (Signed)
Sitting up on stretcher, joking with family. No change since admission

## 2013-03-03 ENCOUNTER — Ambulatory Visit (HOSPITAL_COMMUNITY): Payer: Medicare Other

## 2013-03-04 ENCOUNTER — Ambulatory Visit (HOSPITAL_COMMUNITY)
Admission: RE | Admit: 2013-03-04 | Discharge: 2013-03-04 | Disposition: A | Discharge status: Home / Self Care | Payer: Medicare Other | Source: Ambulatory Visit | Attending: Pulmonary Disease | Admitting: Pulmonary Disease

## 2013-03-04 DIAGNOSIS — E119 Type 2 diabetes mellitus without complications: Secondary | ICD-10-CM | POA: Insufficient documentation

## 2013-03-04 DIAGNOSIS — I6529 Occlusion and stenosis of unspecified carotid artery: Secondary | ICD-10-CM | POA: Diagnosis not present

## 2013-03-04 DIAGNOSIS — I6523 Occlusion and stenosis of bilateral carotid arteries: Secondary | ICD-10-CM

## 2013-03-10 DIAGNOSIS — E785 Hyperlipidemia, unspecified: Secondary | ICD-10-CM | POA: Diagnosis not present

## 2013-03-10 DIAGNOSIS — I259 Chronic ischemic heart disease, unspecified: Secondary | ICD-10-CM | POA: Diagnosis not present

## 2013-03-10 DIAGNOSIS — I10 Essential (primary) hypertension: Secondary | ICD-10-CM | POA: Diagnosis not present

## 2013-03-10 DIAGNOSIS — E109 Type 1 diabetes mellitus without complications: Secondary | ICD-10-CM | POA: Diagnosis not present

## 2013-03-11 DIAGNOSIS — M5412 Radiculopathy, cervical region: Secondary | ICD-10-CM | POA: Diagnosis not present

## 2013-03-11 DIAGNOSIS — M9981 Other biomechanical lesions of cervical region: Secondary | ICD-10-CM | POA: Diagnosis not present

## 2013-03-12 DIAGNOSIS — M5412 Radiculopathy, cervical region: Secondary | ICD-10-CM | POA: Diagnosis not present

## 2013-03-12 DIAGNOSIS — M9981 Other biomechanical lesions of cervical region: Secondary | ICD-10-CM | POA: Diagnosis not present

## 2013-03-15 DIAGNOSIS — M5412 Radiculopathy, cervical region: Secondary | ICD-10-CM | POA: Diagnosis not present

## 2013-03-15 DIAGNOSIS — M9981 Other biomechanical lesions of cervical region: Secondary | ICD-10-CM | POA: Diagnosis not present

## 2013-03-17 DIAGNOSIS — M9981 Other biomechanical lesions of cervical region: Secondary | ICD-10-CM | POA: Diagnosis not present

## 2013-03-17 DIAGNOSIS — M5412 Radiculopathy, cervical region: Secondary | ICD-10-CM | POA: Diagnosis not present

## 2013-03-22 DIAGNOSIS — M9981 Other biomechanical lesions of cervical region: Secondary | ICD-10-CM | POA: Diagnosis not present

## 2013-03-22 DIAGNOSIS — M5412 Radiculopathy, cervical region: Secondary | ICD-10-CM | POA: Diagnosis not present

## 2013-03-25 DIAGNOSIS — M9981 Other biomechanical lesions of cervical region: Secondary | ICD-10-CM | POA: Diagnosis not present

## 2013-03-25 DIAGNOSIS — M5412 Radiculopathy, cervical region: Secondary | ICD-10-CM | POA: Diagnosis not present

## 2013-03-30 DIAGNOSIS — M5412 Radiculopathy, cervical region: Secondary | ICD-10-CM | POA: Diagnosis not present

## 2013-03-30 DIAGNOSIS — M9981 Other biomechanical lesions of cervical region: Secondary | ICD-10-CM | POA: Diagnosis not present

## 2013-04-01 DIAGNOSIS — M9981 Other biomechanical lesions of cervical region: Secondary | ICD-10-CM | POA: Diagnosis not present

## 2013-04-01 DIAGNOSIS — M5412 Radiculopathy, cervical region: Secondary | ICD-10-CM | POA: Diagnosis not present

## 2013-04-05 DIAGNOSIS — M5412 Radiculopathy, cervical region: Secondary | ICD-10-CM | POA: Diagnosis not present

## 2013-04-05 DIAGNOSIS — M9981 Other biomechanical lesions of cervical region: Secondary | ICD-10-CM | POA: Diagnosis not present

## 2013-04-07 DIAGNOSIS — M9981 Other biomechanical lesions of cervical region: Secondary | ICD-10-CM | POA: Diagnosis not present

## 2013-04-07 DIAGNOSIS — M5412 Radiculopathy, cervical region: Secondary | ICD-10-CM | POA: Diagnosis not present

## 2013-04-12 DIAGNOSIS — M5412 Radiculopathy, cervical region: Secondary | ICD-10-CM | POA: Diagnosis not present

## 2013-04-12 DIAGNOSIS — M9981 Other biomechanical lesions of cervical region: Secondary | ICD-10-CM | POA: Diagnosis not present

## 2013-04-14 DIAGNOSIS — M5412 Radiculopathy, cervical region: Secondary | ICD-10-CM | POA: Diagnosis not present

## 2013-04-14 DIAGNOSIS — M9981 Other biomechanical lesions of cervical region: Secondary | ICD-10-CM | POA: Diagnosis not present

## 2013-04-26 DIAGNOSIS — I4892 Unspecified atrial flutter: Secondary | ICD-10-CM | POA: Diagnosis not present

## 2013-04-26 DIAGNOSIS — Z8673 Personal history of transient ischemic attack (TIA), and cerebral infarction without residual deficits: Secondary | ICD-10-CM | POA: Diagnosis not present

## 2013-04-26 DIAGNOSIS — I10 Essential (primary) hypertension: Secondary | ICD-10-CM | POA: Diagnosis not present

## 2013-04-26 DIAGNOSIS — I2510 Atherosclerotic heart disease of native coronary artery without angina pectoris: Secondary | ICD-10-CM | POA: Diagnosis not present

## 2013-04-26 DIAGNOSIS — Z5181 Encounter for therapeutic drug level monitoring: Secondary | ICD-10-CM | POA: Diagnosis not present

## 2013-04-28 DIAGNOSIS — I2510 Atherosclerotic heart disease of native coronary artery without angina pectoris: Secondary | ICD-10-CM | POA: Diagnosis not present

## 2013-04-28 DIAGNOSIS — Z5181 Encounter for therapeutic drug level monitoring: Secondary | ICD-10-CM | POA: Diagnosis not present

## 2013-04-28 DIAGNOSIS — I10 Essential (primary) hypertension: Secondary | ICD-10-CM | POA: Diagnosis not present

## 2013-04-28 DIAGNOSIS — Z8673 Personal history of transient ischemic attack (TIA), and cerebral infarction without residual deficits: Secondary | ICD-10-CM | POA: Diagnosis not present

## 2013-04-28 DIAGNOSIS — I4892 Unspecified atrial flutter: Secondary | ICD-10-CM | POA: Diagnosis not present

## 2013-04-29 DIAGNOSIS — I4892 Unspecified atrial flutter: Secondary | ICD-10-CM | POA: Diagnosis not present

## 2013-04-29 DIAGNOSIS — Z6829 Body mass index (BMI) 29.0-29.9, adult: Secondary | ICD-10-CM | POA: Diagnosis not present

## 2013-04-29 DIAGNOSIS — M542 Cervicalgia: Secondary | ICD-10-CM | POA: Diagnosis not present

## 2013-04-30 DIAGNOSIS — I4892 Unspecified atrial flutter: Secondary | ICD-10-CM | POA: Diagnosis not present

## 2013-04-30 DIAGNOSIS — Z7901 Long term (current) use of anticoagulants: Secondary | ICD-10-CM | POA: Diagnosis not present

## 2013-05-14 DIAGNOSIS — I4892 Unspecified atrial flutter: Secondary | ICD-10-CM | POA: Diagnosis not present

## 2013-05-14 DIAGNOSIS — Z7901 Long term (current) use of anticoagulants: Secondary | ICD-10-CM | POA: Diagnosis not present

## 2013-05-24 DIAGNOSIS — E785 Hyperlipidemia, unspecified: Secondary | ICD-10-CM | POA: Diagnosis not present

## 2013-05-24 DIAGNOSIS — E109 Type 1 diabetes mellitus without complications: Secondary | ICD-10-CM | POA: Diagnosis not present

## 2013-05-26 DIAGNOSIS — I509 Heart failure, unspecified: Secondary | ICD-10-CM | POA: Diagnosis not present

## 2013-05-26 DIAGNOSIS — E109 Type 1 diabetes mellitus without complications: Secondary | ICD-10-CM | POA: Diagnosis not present

## 2013-05-26 DIAGNOSIS — I4892 Unspecified atrial flutter: Secondary | ICD-10-CM | POA: Diagnosis not present

## 2013-05-26 DIAGNOSIS — I10 Essential (primary) hypertension: Secondary | ICD-10-CM | POA: Diagnosis not present

## 2013-05-26 DIAGNOSIS — E785 Hyperlipidemia, unspecified: Secondary | ICD-10-CM | POA: Diagnosis not present

## 2013-05-26 DIAGNOSIS — Z5181 Encounter for therapeutic drug level monitoring: Secondary | ICD-10-CM | POA: Diagnosis not present

## 2013-05-26 DIAGNOSIS — I259 Chronic ischemic heart disease, unspecified: Secondary | ICD-10-CM | POA: Diagnosis not present

## 2013-05-26 DIAGNOSIS — I2510 Atherosclerotic heart disease of native coronary artery without angina pectoris: Secondary | ICD-10-CM | POA: Diagnosis not present

## 2013-05-26 DIAGNOSIS — I5040 Unspecified combined systolic (congestive) and diastolic (congestive) heart failure: Secondary | ICD-10-CM | POA: Diagnosis not present

## 2013-05-28 DIAGNOSIS — I4892 Unspecified atrial flutter: Secondary | ICD-10-CM | POA: Diagnosis not present

## 2013-05-28 DIAGNOSIS — Z7901 Long term (current) use of anticoagulants: Secondary | ICD-10-CM | POA: Diagnosis not present

## 2013-06-09 ENCOUNTER — Ambulatory Visit (INDEPENDENT_AMBULATORY_CARE_PROVIDER_SITE_OTHER): Payer: Medicare Other | Admitting: Internal Medicine

## 2013-06-09 ENCOUNTER — Encounter: Payer: Self-pay | Admitting: Internal Medicine

## 2013-06-09 ENCOUNTER — Encounter (HOSPITAL_COMMUNITY): Payer: Self-pay | Admitting: Pharmacy Technician

## 2013-06-09 VITALS — BP 118/68 | HR 68 | Ht 65.0 in | Wt 176.4 lb

## 2013-06-09 DIAGNOSIS — I10 Essential (primary) hypertension: Secondary | ICD-10-CM | POA: Diagnosis not present

## 2013-06-09 DIAGNOSIS — I2510 Atherosclerotic heart disease of native coronary artery without angina pectoris: Secondary | ICD-10-CM

## 2013-06-09 DIAGNOSIS — Z7901 Long term (current) use of anticoagulants: Secondary | ICD-10-CM | POA: Diagnosis not present

## 2013-06-09 DIAGNOSIS — I4892 Unspecified atrial flutter: Secondary | ICD-10-CM

## 2013-06-09 DIAGNOSIS — Z01810 Encounter for preprocedural cardiovascular examination: Secondary | ICD-10-CM

## 2013-06-09 LAB — CBC WITH DIFFERENTIAL/PLATELET
Basophils Absolute: 0.1 10*3/uL (ref 0.0–0.1)
Basophils Relative: 0.8 % (ref 0.0–3.0)
Eosinophils Absolute: 0.1 10*3/uL (ref 0.0–0.7)
Eosinophils Relative: 2.1 % (ref 0.0–5.0)
HCT: 35.5 % — ABNORMAL LOW (ref 36.0–46.0)
Hemoglobin: 12 g/dL (ref 12.0–15.0)
Lymphocytes Relative: 33 % (ref 12.0–46.0)
Lymphs Abs: 2.1 10*3/uL (ref 0.7–4.0)
MCHC: 33.7 g/dL (ref 30.0–36.0)
MCV: 88.4 fl (ref 78.0–100.0)
Monocytes Absolute: 0.5 10*3/uL (ref 0.1–1.0)
Monocytes Relative: 7 % (ref 3.0–12.0)
Neutro Abs: 3.7 10*3/uL (ref 1.4–7.7)
Neutrophils Relative %: 57.1 % (ref 43.0–77.0)
Platelets: 333 10*3/uL (ref 150.0–400.0)
RBC: 4.02 Mil/uL (ref 3.87–5.11)
RDW: 13.5 % (ref 11.5–14.6)
WBC: 6.4 10*3/uL (ref 4.5–10.5)

## 2013-06-09 LAB — BASIC METABOLIC PANEL
BUN: 35 mg/dL — ABNORMAL HIGH (ref 6–23)
CO2: 26 mEq/L (ref 19–32)
Calcium: 9.3 mg/dL (ref 8.4–10.5)
Chloride: 108 mEq/L (ref 96–112)
Creatinine, Ser: 1.3 mg/dL — ABNORMAL HIGH (ref 0.4–1.2)
GFR: 43.07 mL/min — ABNORMAL LOW (ref >60.00)
Glucose, Bld: 67 mg/dL — ABNORMAL LOW (ref 70–99)
Potassium: 4.9 mEq/L (ref 3.5–5.1)
Sodium: 140 mEq/L (ref 135–145)

## 2013-06-09 LAB — PROTIME-INR
INR: 1.8 ratio — ABNORMAL HIGH (ref 0.8–1.0)
Prothrombin Time: 18.6 s — ABNORMAL HIGH (ref 10.2–12.4)

## 2013-06-09 NOTE — Progress Notes (Signed)
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  She reports that she has had occasional palpitations and fatigue with her atrial flutter.  She remains active. She has been anticoagulated with coumadin.   Today, she denies symptoms of chest pain, shortness of breath, orthopnea, PND, lower extremity edema, dizziness, presyncope, syncope, or neurologic sequela. The patient is tolerating medications without difficulties and is otherwise without complaint today.   Past Medical History  Diagnosis Date  . Ischemic cardiomyopathy   . Myocardial infarction   . Diabetes mellitus without complication   . Hyperlipidemia   . Bilateral pulmonary embolism 08/2008    On coumadin  . Coronary artery disease     s/p DES to mid RCA in 02/2008; s/p inferior MI with DES to mid RCA and mid LAD in 05/2005  . Unspecified combined systolic and diastolic heart failure     Asymptomatic. Echo 04/28/13 EF= 40- 45%  . Hypertension     Controlled  . Atrial flutter     typical appearing   Past Surgical History  Procedure Laterality Date  . Cholecystectomy    . Hip surgery      Current Outpatient Prescriptions  Medication Sig Dispense Refill  . amLODipine (NORVASC) 5 MG tablet Take 5 mg by mouth daily.      Marland Kitchen aspirin EC 81 MG tablet Take 81 mg by mouth daily.      . enalapril (VASOTEC) 10 MG tablet Take 10 mg by mouth 2 (two) times daily.       Marland Kitchen glipiZIDE (GLUCOTROL XL) 10 MG 24 hr tablet Take 10 mg by mouth daily.      . hydrochlorothiazide (HYDRODIURIL) 25 MG tablet Take 25 mg by mouth daily.      . metFORMIN (GLUCOPHAGE) 500 MG tablet Take 1,000 mg by mouth 2 (two) times daily with a meal.       . metoprolol (LOPRESSOR) 50 MG tablet Take 50 mg by mouth 2 (two) times daily.      . nitroGLYCERIN (NITROSTAT) 0.4 MG SL tablet Place 0.4 mg under the tongue  every 5 (five) minutes as needed for chest pain. 3 tablets maximum      . warfarin (COUMADIN) 5 MG tablet Take 5 mg by mouth daily. Take as directed by coumadin clinic       No current facility-administered medications for this visit.    No Known Allergies  History   Social History  . Marital Status: Married    Spouse Name: N/A    Number of Children: 3  . Years of Education: N/A   Occupational History  . Not on file.   Social History Main Topics  . Smoking status: Former Smoker -- 10 years    Types: Cigarettes    Quit date: 10/14/1978  . Smokeless tobacco: Not on file  . Alcohol Use: No  . Drug Use: No  . Sexual Activity: Not on file   Other Topics Concern  . Not on file   Social History Narrative  . No narrative on file    Family History  Problem Relation Age of Onset  . Heart disease    . Arthritis    . Cancer    . Diabetes      ROS- All systems are reviewed and negative except as per the HPI above  Physical  Exam: Filed Vitals:   06/09/13 1001  BP: 118/68  Pulse: 68  Height: 5\' 5"  (1.651 m)  Weight: 176 lb 6.4 oz (80.015 kg)    GEN- The patient is well appearing, alert and oriented x 3 today.   Head- normocephalic, atraumatic Eyes-  Sclera clear, conjunctiva pink Ears- hearing intact Oropharynx- clear Neck- supple, no JVP Lymph- no cervical lymphadenopathy Lungs- Clear to ausculation bilaterally, normal work of breathing Heart- irregular rate and rhythm, no murmurs, rubs or gallops, PMI not laterally displaced GI- soft, NT, ND, + BS Extremities- no clubbing, cyanosis, or edema MS- no significant deformity or atrophy Skin- no rash or lesion Psych- euthymic mood, full affect Neuro- strength and sensation are intact  EKG typical atrial flutter V rates 68 bpm, nonspecific ST/T changes Echo reviewed Dr Darliss Ridgel notes are reviewed  Assessment and Plan:  1. Atrial flutter The patient has minimally symptomatic typical appearing atrial flutter.  Her  CHADS2VASC score is at least 5.  She is appropriately anticoagulated with coumadin.  Therapeutic strategies for atrial flutter including medicine and ablation were discussed in detail with the patient today. Risk, benefits, and alternatives to EP study and radiofrequency ablation were also discussed in detail today. These risks include but are not limited to stroke, bleeding, vascular damage, tamponade, perforation, damage to the heart and other structures, AV block requiring pacemaker, worsening renal function, and death. The patient understands these risk and wishes to proceed.  We will therefore proceed with catheter ablation at the next available time.  Anesthesia will be scheduled for the procedure.  2. HTN Stable No change required today  3. CAD No ischemic symptoms Stable No change required today

## 2013-06-09 NOTE — Patient Instructions (Addendum)

## 2013-06-11 ENCOUNTER — Other Ambulatory Visit: Payer: Self-pay | Admitting: *Deleted

## 2013-06-16 ENCOUNTER — Encounter (HOSPITAL_COMMUNITY): Payer: Self-pay | Admitting: Anesthesiology

## 2013-06-17 ENCOUNTER — Encounter (HOSPITAL_COMMUNITY)
Admission: RE | Disposition: A | Discharge status: Home / Self Care | Payer: Self-pay | Source: Ambulatory Visit | Attending: Internal Medicine

## 2013-06-17 ENCOUNTER — Ambulatory Visit (HOSPITAL_COMMUNITY)
Admission: RE | Admit: 2013-06-17 | Discharge: 2013-06-17 | Disposition: A | Discharge status: Home / Self Care | Payer: Medicare Other | Source: Ambulatory Visit | Attending: Internal Medicine | Admitting: Internal Medicine

## 2013-06-17 DIAGNOSIS — I10 Essential (primary) hypertension: Secondary | ICD-10-CM | POA: Insufficient documentation

## 2013-06-17 DIAGNOSIS — E785 Hyperlipidemia, unspecified: Secondary | ICD-10-CM | POA: Diagnosis not present

## 2013-06-17 DIAGNOSIS — I2510 Atherosclerotic heart disease of native coronary artery without angina pectoris: Secondary | ICD-10-CM | POA: Insufficient documentation

## 2013-06-17 DIAGNOSIS — E119 Type 2 diabetes mellitus without complications: Secondary | ICD-10-CM | POA: Diagnosis not present

## 2013-06-17 DIAGNOSIS — I4892 Unspecified atrial flutter: Secondary | ICD-10-CM | POA: Diagnosis present

## 2013-06-17 DIAGNOSIS — Z7901 Long term (current) use of anticoagulants: Secondary | ICD-10-CM | POA: Insufficient documentation

## 2013-06-17 HISTORY — PX: ATRIAL FLUTTER ABLATION: SHX5733

## 2013-06-17 LAB — GLUCOSE, CAPILLARY
Glucose-Capillary: 73 mg/dL (ref 70–99)
Glucose-Capillary: 140 mg/dL — ABNORMAL HIGH (ref 70–99)
Glucose-Capillary: 83 mg/dL (ref 70–99)

## 2013-06-17 LAB — PROTIME-INR
INR: 2.27 — ABNORMAL HIGH (ref 0.00–1.49)
Prothrombin Time: 24.3 seconds — ABNORMAL HIGH (ref 11.6–15.2)

## 2013-06-17 SURGERY — ATRIAL FLUTTER ABLATION
Anesthesia: LOCAL

## 2013-06-17 MED ORDER — WARFARIN SODIUM 5 MG PO TABS: 5.0000 mg | ORAL_TABLET | ORAL | Status: DC

## 2013-06-17 MED ORDER — HYDROCODONE-ACETAMINOPHEN 5-325 MG PO TABS: 1.0000 | ORAL_TABLET | ORAL | Status: DC | PRN

## 2013-06-17 MED ORDER — FENTANYL CITRATE 0.05 MG/ML IJ SOLN
INTRAMUSCULAR | Status: AC
  Filled 2013-06-17: qty 2

## 2013-06-17 MED ORDER — HYDROCHLOROTHIAZIDE 25 MG PO TABS
25.0000 mg | ORAL_TABLET | Freq: Every day | ORAL | Status: DC
  Filled 2013-06-17: qty 1

## 2013-06-17 MED ORDER — GLIPIZIDE ER 10 MG PO TB24
10.0000 mg | ORAL_TABLET | Freq: Every day | ORAL | Status: DC
  Filled 2013-06-17 (×2): qty 1

## 2013-06-17 MED ORDER — SODIUM CHLORIDE 0.9 % IJ SOLN: 3.0000 mL | INTRAMUSCULAR | Status: DC | PRN

## 2013-06-17 MED ORDER — SODIUM CHLORIDE 0.9 % IJ SOLN: 3.0000 mL | Freq: Two times a day (BID) | INTRAMUSCULAR | Status: DC

## 2013-06-17 MED ORDER — ACETAMINOPHEN 325 MG PO TABS: 650.0000 mg | ORAL_TABLET | ORAL | Status: DC | PRN

## 2013-06-17 MED ORDER — METOPROLOL TARTRATE 50 MG PO TABS
50.0000 mg | ORAL_TABLET | Freq: Two times a day (BID) | ORAL | Status: DC
  Filled 2013-06-17 (×2): qty 1

## 2013-06-17 MED ORDER — DEXTROSE 50 % IV SOLN
INTRAVENOUS | Status: AC
  Filled 2013-06-17: qty 50

## 2013-06-17 MED ORDER — SODIUM CHLORIDE 0.9 % IV SOLN: 250.0000 mL | INTRAVENOUS | Status: DC | PRN

## 2013-06-17 MED ORDER — METFORMIN HCL 500 MG PO TABS
1000.0000 mg | ORAL_TABLET | Freq: Two times a day (BID) | ORAL | Status: DC
  Filled 2013-06-17 (×2): qty 2

## 2013-06-17 MED ORDER — ONDANSETRON HCL 4 MG/2ML IJ SOLN: 4.0000 mg | Freq: Four times a day (QID) | INTRAMUSCULAR | Status: DC | PRN

## 2013-06-17 MED ORDER — AMLODIPINE BESYLATE 5 MG PO TABS
5.0000 mg | ORAL_TABLET | Freq: Every day | ORAL | Status: DC
  Filled 2013-06-17: qty 1

## 2013-06-17 MED ORDER — ENALAPRIL MALEATE 10 MG PO TABS
10.0000 mg | ORAL_TABLET | Freq: Two times a day (BID) | ORAL | Status: DC
  Filled 2013-06-17 (×2): qty 1

## 2013-06-17 MED ORDER — WARFARIN - PHYSICIAN DOSING INPATIENT: Freq: Every day | Status: DC

## 2013-06-17 MED ORDER — WARFARIN SODIUM 10 MG PO TABS: 10.0000 mg | ORAL_TABLET | ORAL | Status: DC

## 2013-06-17 MED ORDER — BUPIVACAINE HCL (PF) 0.25 % IJ SOLN
INTRAMUSCULAR | Status: AC
  Filled 2013-06-17: qty 60

## 2013-06-17 MED ORDER — MIDAZOLAM HCL 5 MG/5ML IJ SOLN
INTRAMUSCULAR | Status: AC
  Filled 2013-06-17: qty 10

## 2013-06-17 NOTE — H&P (View-Only) (Signed)
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  She reports that she has had occasional palpitations and fatigue with her atrial flutter.  She remains active. She has been anticoagulated with coumadin.   Today, she denies symptoms of chest pain, shortness of breath, orthopnea, PND, lower extremity edema, dizziness, presyncope, syncope, or neurologic sequela. The patient is tolerating medications without difficulties and is otherwise without complaint today.   Past Medical History  Diagnosis Date  . Ischemic cardiomyopathy   . Myocardial infarction   . Diabetes mellitus without complication   . Hyperlipidemia   . Bilateral pulmonary embolism 08/2008    On coumadin  . Coronary artery disease     s/p DES to mid RCA in 02/2008; s/p inferior MI with DES to mid RCA and mid LAD in 05/2005  . Unspecified combined systolic and diastolic heart failure     Asymptomatic. Echo 04/28/13 EF= 40- 45%  . Hypertension     Controlled  . Atrial flutter     typical appearing   Past Surgical History  Procedure Laterality Date  . Cholecystectomy    . Hip surgery      Current Outpatient Prescriptions  Medication Sig Dispense Refill  . amLODipine (NORVASC) 5 MG tablet Take 5 mg by mouth daily.      Marland Kitchen aspirin EC 81 MG tablet Take 81 mg by mouth daily.      . enalapril (VASOTEC) 10 MG tablet Take 10 mg by mouth 2 (two) times daily.       Marland Kitchen glipiZIDE (GLUCOTROL XL) 10 MG 24 hr tablet Take 10 mg by mouth daily.      . hydrochlorothiazide (HYDRODIURIL) 25 MG tablet Take 25 mg by mouth daily.      . metFORMIN (GLUCOPHAGE) 500 MG tablet Take 1,000 mg by mouth 2 (two) times daily with a meal.       . metoprolol (LOPRESSOR) 50 MG tablet Take 50 mg by mouth 2 (two) times daily.      . nitroGLYCERIN (NITROSTAT) 0.4 MG SL tablet Place 0.4 mg under the tongue  every 5 (five) minutes as needed for chest pain. 3 tablets maximum      . warfarin (COUMADIN) 5 MG tablet Take 5 mg by mouth daily. Take as directed by coumadin clinic       No current facility-administered medications for this visit.    No Known Allergies  History   Social History  . Marital Status: Married    Spouse Name: N/A    Number of Children: 3  . Years of Education: N/A   Occupational History  . Not on file.   Social History Main Topics  . Smoking status: Former Smoker -- 10 years    Types: Cigarettes    Quit date: 10/14/1978  . Smokeless tobacco: Not on file  . Alcohol Use: No  . Drug Use: No  . Sexual Activity: Not on file   Other Topics Concern  . Not on file   Social History Narrative  . No narrative on file    Family History  Problem Relation Age of Onset  . Heart disease    . Arthritis    . Cancer    . Diabetes      ROS- All systems are reviewed and negative except as per the HPI above  Physical  Exam: Filed Vitals:   06/09/13 1001  BP: 118/68  Pulse: 68  Height: 5\' 5"  (1.651 m)  Weight: 176 lb 6.4 oz (80.015 kg)    GEN- The patient is well appearing, alert and oriented x 3 today.   Head- normocephalic, atraumatic Eyes-  Sclera clear, conjunctiva pink Ears- hearing intact Oropharynx- clear Neck- supple, no JVP Lymph- no cervical lymphadenopathy Lungs- Clear to ausculation bilaterally, normal work of breathing Heart- irregular rate and rhythm, no murmurs, rubs or gallops, PMI not laterally displaced GI- soft, NT, ND, + BS Extremities- no clubbing, cyanosis, or edema MS- no significant deformity or atrophy Skin- no rash or lesion Psych- euthymic mood, full affect Neuro- strength and sensation are intact  EKG typical atrial flutter V rates 68 bpm, nonspecific ST/T changes Echo reviewed Dr Darliss Ridgel notes are reviewed  Assessment and Plan:  1. Atrial flutter The patient has minimally symptomatic typical appearing atrial flutter.  Her  CHADS2VASC score is at least 5.  She is appropriately anticoagulated with coumadin.  Therapeutic strategies for atrial flutter including medicine and ablation were discussed in detail with the patient today. Risk, benefits, and alternatives to EP study and radiofrequency ablation were also discussed in detail today. These risks include but are not limited to stroke, bleeding, vascular damage, tamponade, perforation, damage to the heart and other structures, AV block requiring pacemaker, worsening renal function, and death. The patient understands these risk and wishes to proceed.  We will therefore proceed with catheter ablation at the next available time.  Anesthesia will be scheduled for the procedure.  2. HTN Stable No change required today  3. CAD No ischemic symptoms Stable No change required today

## 2013-06-17 NOTE — Op Note (Signed)
PREPROCEDURE DIAGNOSIS: Typical-appearing atrial flutter.   POSTPROCEDURE DIAGNOSIS: Isthmus-dependent counter clockwise right atrial flutter.   PROCEDURES:  1. Comprehensive EP study.  2. Coronary sinus pacing and recording.  3. Mapping of SVT.  4. Ablation of SVT.   INTRODUCTION:  [name] is a 72 y.o. female with a history of symptomatic typical-appearing atrial flutter. She presents today for EP study and radiofrequency ablation.   DESCRIPTION OF THE PROCEDURE: Informed written consent was obtained, and the patient was brought to the electrophysiology lab in the fasting state. The patient was then adequately sedated with anesthesia as outlined in the anesthesia report. The patient's right groin was prepped and draped in the usual sterile fashion by the EP lab staff. Using a percutaneous Seldinger technique two six french and one 8-French hemostasis sheaths were placed into the right common femoral vein. A 6 - [employer] X decapolar coronary sinus catheter was introduced through the right common femoral vein and advanced into the coronary sinus for recording and pacing from this location. A 6-French quadripolar Josephson catheter was introduced through the right common femoral vein and advanced into the right ventricle for recording and pacing. This catheter was then pulled back to the His bundle location. The patient presented to the electrophysiology lab in atrial flutter. The surface electrocardiogram was consistent with typical atrial flutter. The coronary sinus activation sequence was proximal to distal and suggestive of right atrial flutter.  The atrial flutter cycle length was 220 msec. Atrial entrainment mapping was then performed. When pacing from the left atrium, a long post pacing interval was observed. With entrainment mapping from the cavotricuspid isthmus, the post pacing interval was equal to the tachycardia cycle length and therefore suggestive of  isthmus-dependent right atrial flutter. The patient's QRS duration measured 89 msec with an RR interval of 550 msec and an HV interval of 52 msec. I elected to perform cavotricuspid isthmus ablation.   A [employer] 7-French  63mm ablation catheter was introduced through the right common femoral vein and advanced into the right atrium. Mapping of the cavotricuspid isthmus was performed which revealed a standard isthmus. A series of ten radiofrequency applications were delivered along the cavotricuspid isthmus with a target temperature of 60 degrees with power of 70-80 watts. During the 8th radiofrequency ablation lesion, the tachycardia slowed and then terminated. The patient remained in sinus rhythm thereafter. An additional 2 radiofrequency applications were then delivered through a 9.5 F RAMP sheath with similar parameters in order to obtain complete bidirectional cavotricuspid isthmus block.   Following ablation, differential atrial pacing was performed from the low lateral right atrium with a Duodecapolar halo catheter in place. This confirmed complete bidirectional cavotricuspid isthmus block with a stimulus to earliest atrial activation recorded bidirectional across the isthmus measuring 170 msec. The patient was observed for 20 minutes without return of conduction through the isthmus.  Following ablation, the AH interval measured 100 msec with an HV interval of 54 msec. Rapid atrial pacing was performed, which revealed an AV Wenckebach cycle length of 500 msec with no evidence of PR greater than RR and no tachycardias induced when pacing down to a cycle length of 250 msec. Ventricular pacing was then performed which revealed VA dissociation when pacing at a cycle length of 600 msec.  The patient was observed to have very frequent atrial ectopy arising from the left atrium. The procedure was considered completed. All catheters were removed, and the sheaths were aspirated and flushed. The sheaths  were  removed and hemostasis was assured. There were no early apparent complications.   CONCLUSIONS:  1. Isthmus-dependent counter clockwise right atrial flutter upon presentation, successfully ablated along the usual cavotricuspid isthmus.  2. Complete bidirectional cavotricuspid isthmus block achieved.  3. No inducible arrhythmias following ablation.  4. No early apparent complications.  Caitlin Rinks Michaela Shankel,MD 06/17/2013 9:20 AM

## 2013-06-17 NOTE — Interval H&P Note (Signed)
History and Physical Interval Note:  06/17/2013 7:22 AM  [name]  has presented today for surgery, with the diagnosis of Flutter  The various methods of treatment have been discussed with the patient and family. After consideration of risks, benefits and other options for treatment, the patient has consented to  Procedure(s): ATRIAL FLUTTER ABLATION (N/A) as a surgical intervention .  The patient's history has been reviewed, patient examined, no change in status, stable for surgery.  I have reviewed the patient's chart and labs.  Questions were answered to the patient's satisfaction.     Thompson Grayer

## 2013-06-17 NOTE — Progress Notes (Signed)
[name] is doing well s/p EPS +RF ablation for atrial flutter. She is ambulating without difficulty. Groin site intact without bleeding or hematoma. Telemetry shows SR with PACs. She is eager to go home. Reviewed DC instructions, activity restrictions and wound care. There were no changes made to her medications. She is stable for DC home today. She will follow-up with Dr. Rayann Heman in 4 weeks. Ms. Forthman and her husband expressed verbal understanding and agree with this plan of care.

## 2013-06-23 DIAGNOSIS — Z7901 Long term (current) use of anticoagulants: Secondary | ICD-10-CM | POA: Diagnosis not present

## 2013-06-23 DIAGNOSIS — I4892 Unspecified atrial flutter: Secondary | ICD-10-CM | POA: Diagnosis not present

## 2013-06-30 ENCOUNTER — Encounter: Payer: Self-pay | Admitting: Internal Medicine

## 2013-07-16 ENCOUNTER — Encounter: Payer: Self-pay | Admitting: Internal Medicine

## 2013-07-21 ENCOUNTER — Encounter: Payer: Medicare Other | Admitting: Internal Medicine

## 2013-07-22 ENCOUNTER — Ambulatory Visit (INDEPENDENT_AMBULATORY_CARE_PROVIDER_SITE_OTHER): Payer: Medicare Other | Admitting: Pharmacist

## 2013-07-22 DIAGNOSIS — I4892 Unspecified atrial flutter: Secondary | ICD-10-CM | POA: Diagnosis not present

## 2013-07-22 LAB — POCT INR: INR: 2.5

## 2013-08-02 DIAGNOSIS — Z23 Encounter for immunization: Secondary | ICD-10-CM | POA: Diagnosis not present

## 2013-08-19 ENCOUNTER — Ambulatory Visit (INDEPENDENT_AMBULATORY_CARE_PROVIDER_SITE_OTHER): Payer: Medicare Other | Admitting: *Deleted

## 2013-08-19 DIAGNOSIS — I4892 Unspecified atrial flutter: Secondary | ICD-10-CM

## 2013-08-19 LAB — POCT INR: INR: 1.8

## 2013-08-25 DIAGNOSIS — E109 Type 1 diabetes mellitus without complications: Secondary | ICD-10-CM | POA: Diagnosis not present

## 2013-08-25 DIAGNOSIS — I4891 Unspecified atrial fibrillation: Secondary | ICD-10-CM | POA: Diagnosis not present

## 2013-08-25 DIAGNOSIS — I129 Hypertensive chronic kidney disease with stage 1 through stage 4 chronic kidney disease, or unspecified chronic kidney disease: Secondary | ICD-10-CM | POA: Diagnosis not present

## 2013-09-02 ENCOUNTER — Ambulatory Visit (INDEPENDENT_AMBULATORY_CARE_PROVIDER_SITE_OTHER): Payer: Medicare Other | Admitting: *Deleted

## 2013-09-02 DIAGNOSIS — I4892 Unspecified atrial flutter: Secondary | ICD-10-CM

## 2013-09-02 LAB — POCT INR: INR: 2.3

## 2013-09-20 ENCOUNTER — Other Ambulatory Visit: Payer: Self-pay

## 2013-09-20 MED ORDER — ENALAPRIL MALEATE 10 MG PO TABS: 10.0000 mg | ORAL_TABLET | Freq: Two times a day (BID) | ORAL | Status: DC

## 2013-11-25 DIAGNOSIS — Z79899 Other long term (current) drug therapy: Secondary | ICD-10-CM | POA: Diagnosis not present

## 2013-11-25 DIAGNOSIS — I10 Essential (primary) hypertension: Secondary | ICD-10-CM | POA: Diagnosis not present

## 2013-11-25 DIAGNOSIS — E785 Hyperlipidemia, unspecified: Secondary | ICD-10-CM | POA: Diagnosis not present

## 2013-11-25 DIAGNOSIS — E109 Type 1 diabetes mellitus without complications: Secondary | ICD-10-CM | POA: Diagnosis not present

## 2013-11-25 DIAGNOSIS — I259 Chronic ischemic heart disease, unspecified: Secondary | ICD-10-CM | POA: Diagnosis not present

## 2013-12-01 ENCOUNTER — Ambulatory Visit (INDEPENDENT_AMBULATORY_CARE_PROVIDER_SITE_OTHER): Payer: Medicare Other | Admitting: *Deleted

## 2013-12-01 DIAGNOSIS — Z5181 Encounter for therapeutic drug level monitoring: Secondary | ICD-10-CM

## 2013-12-01 DIAGNOSIS — I4892 Unspecified atrial flutter: Secondary | ICD-10-CM | POA: Diagnosis not present

## 2013-12-01 LAB — POCT INR: INR: 2.1

## 2013-12-29 ENCOUNTER — Ambulatory Visit (INDEPENDENT_AMBULATORY_CARE_PROVIDER_SITE_OTHER): Payer: Medicare Other | Admitting: *Deleted

## 2013-12-29 DIAGNOSIS — I4892 Unspecified atrial flutter: Secondary | ICD-10-CM

## 2013-12-29 DIAGNOSIS — Z5181 Encounter for therapeutic drug level monitoring: Secondary | ICD-10-CM

## 2013-12-29 LAB — POCT INR: INR: 2.6

## 2014-01-12 ENCOUNTER — Encounter: Payer: Self-pay | Admitting: Interventional Cardiology

## 2014-01-12 ENCOUNTER — Ambulatory Visit (INDEPENDENT_AMBULATORY_CARE_PROVIDER_SITE_OTHER): Payer: Medicare Other | Admitting: Interventional Cardiology

## 2014-01-12 VITALS — BP 150/62 | HR 76 | Ht 64.0 in | Wt 160.4 lb

## 2014-01-12 DIAGNOSIS — I4892 Unspecified atrial flutter: Secondary | ICD-10-CM

## 2014-01-12 DIAGNOSIS — I10 Essential (primary) hypertension: Secondary | ICD-10-CM

## 2014-01-12 DIAGNOSIS — I2510 Atherosclerotic heart disease of native coronary artery without angina pectoris: Secondary | ICD-10-CM

## 2014-01-12 DIAGNOSIS — Z5181 Encounter for therapeutic drug level monitoring: Secondary | ICD-10-CM | POA: Diagnosis not present

## 2014-01-12 MED ORDER — HYDROCHLOROTHIAZIDE 25 MG PO TABS: 25.0000 mg | ORAL_TABLET | Freq: Every day | ORAL | Status: DC

## 2014-01-12 NOTE — Progress Notes (Signed)
Patient ID: [name], female   DOB: 06-03-1941, 73 y.o.   MRN: [id number]    [address], [place]  [address] Phone: [phone] Fax:  [phone]  Date:  01/12/2014   ID:  [name], DOB 1940-10-23, MRN [id number]  PCP:  Alonza Bogus, MD   ASSESSMENT:  1. Atrial flutter, with normal sinus rhythm on EKG performed today 2. Chronic anticoagulation therapy 3. Coronary artery disease with prior stent 4. Hypertension  PLAN:  1. Continue anticoagulation therapy for at least another 6 months 2. We'll consider a 48 hour Holter monitor, in 5 months and discontinuation of anticoagulation at the next office visit if she has had a quiet 6 months.   SUBJECTIVE: [name] is a 73 y.o. female with no cardiovascular complaints. No bleeding on Coumadin. She denies orthopnea, PND, ankle edema, and other complaints.   Wt Readings from Last 3 Encounters:  01/12/14 160 lb 6.4 oz (72.757 kg)  06/17/13 170 lb (77.111 kg)  06/17/13 170 lb (77.111 kg)     Past Medical History  Diagnosis Date  . Ischemic cardiomyopathy   . Myocardial infarction   . Diabetes mellitus without complication   . Hyperlipidemia   . Bilateral pulmonary embolism 08/2008    On coumadin  . Coronary artery disease     s/p DES to mid RCA in 02/2008; s/p inferior MI with DES to mid RCA and mid LAD in 05/2005  . Unspecified combined systolic and diastolic heart failure     Asymptomatic. Echo 04/28/13 EF= 40- 45%  . Hypertension     Controlled  . Atrial flutter     typical appearing    Current Outpatient Prescriptions  Medication Sig Dispense Refill  . amLODipine (NORVASC) 5 MG tablet Take 5 mg by mouth daily.      Marland Kitchen aspirin EC 81 MG tablet Take 81 mg by mouth daily.      . enalapril (VASOTEC) 10 MG tablet Take 1 tablet (10 mg total) by mouth 2 (two) times daily.  60 tablet  6  . glipiZIDE (GLUCOTROL XL) 10 MG 24 hr tablet Take 10 mg by mouth daily.      . metFORMIN  (GLUCOPHAGE) 500 MG tablet Take 1,000 mg by mouth 2 (two) times daily with a meal.       . metoprolol (LOPRESSOR) 50 MG tablet Take 50 mg by mouth 2 (two) times daily.      . nitroGLYCERIN (NITROSTAT) 0.4 MG SL tablet Place 0.4 mg under the tongue every 5 (five) minutes as needed for chest pain. 3 tablets maximum      . Omega-3 Fatty Acids (OMEGA 3 PO) Take 1 capsule by mouth daily. Omega Red Krill      . vitamin C (ASCORBIC ACID) 500 MG tablet Take 500 mg by mouth daily.      . vitamin E 400 UNIT capsule Take 400 Units by mouth daily.      Marland Kitchen warfarin (COUMADIN) 5 MG tablet Take 5-10 mg by mouth daily. Take 5 mg on Monday, Tuesday, Wednesday, Friday, Saturday and Sunday.  Take 10 mg on Thursday.       No current facility-administered medications for this visit.    Allergies:   No Known Allergies  Social History:  The patient  reports that she quit smoking about 35 years ago. Her smoking use included Cigarettes. She smoked 0.00 packs per day for 10 years. She does not have any smokeless tobacco  history on file. She reports that she does not drink alcohol or use illicit drugs.   ROS:  Please see the history of present illness.      All other systems reviewed and negative.   OBJECTIVE: VS:  BP 150/62  Pulse 76  Ht 5\' 4"  (1.626 m)  Wt 160 lb 6.4 oz (72.757 kg)  BMI 27.52 kg/m2 Well nourished, well developed, in no acute distress, has lost weight HEENT: normal Neck: JVD flat. Carotid bruit absent  Cardiac:  normal S1, S2; RRR; no murmur Lungs:  clear to auscultation bilaterally, no wheezing, rhonchi or rales Abd: soft, nontender, no hepatomegaly Ext: Edema absent. Pulses 2+ and symmetric Skin: warm and dry Neuro:  CNs 2-12 intact, no focal abnormalities noted  EKG:  Normal sinus rhythm with poor R-wave progression       Signed, Illene Labrador III, MD 01/12/2014 11:21 AM

## 2014-01-12 NOTE — Patient Instructions (Signed)
Your physician recommends that you continue on your current medications as directed. Please refer to the Current Medication list given to you today.  Your physician has recommended that you wear a holter monitor. Holter monitors are medical devices that record the heart's electrical activity. Doctors most often use these monitors to diagnose arrhythmias. Arrhythmias are problems with the speed or rhythm of the heartbeat. The monitor is a small, portable device. You can wear one while you do your normal daily activities. This is usually used to diagnose what is causing palpitations/syncope (passing out). ( To be worn 2 weeks prior to follow up appt)  Your physician wants you to follow-up in: 6 months You will receive a reminder letter in the mail two months in advance. If you don't receive a letter, please call our office to schedule the follow-up appointment.

## 2014-02-14 ENCOUNTER — Other Ambulatory Visit (HOSPITAL_COMMUNITY): Payer: Self-pay | Admitting: Pulmonary Disease

## 2014-02-14 ENCOUNTER — Ambulatory Visit (HOSPITAL_COMMUNITY)
Admission: RE | Admit: 2014-02-14 | Discharge: 2014-02-14 | Disposition: A | Discharge status: Home / Self Care | Payer: Medicare Other | Source: Ambulatory Visit | Attending: Pulmonary Disease | Admitting: Pulmonary Disease

## 2014-02-14 DIAGNOSIS — M79609 Pain in unspecified limb: Secondary | ICD-10-CM | POA: Insufficient documentation

## 2014-02-14 DIAGNOSIS — I6529 Occlusion and stenosis of unspecified carotid artery: Secondary | ICD-10-CM

## 2014-02-14 DIAGNOSIS — M949 Disorder of cartilage, unspecified: Secondary | ICD-10-CM

## 2014-02-14 DIAGNOSIS — M899 Disorder of bone, unspecified: Secondary | ICD-10-CM | POA: Diagnosis not present

## 2014-02-14 DIAGNOSIS — M79676 Pain in unspecified toe(s): Secondary | ICD-10-CM

## 2014-02-14 DIAGNOSIS — T1490XA Injury, unspecified, initial encounter: Secondary | ICD-10-CM

## 2014-03-08 ENCOUNTER — Ambulatory Visit (HOSPITAL_COMMUNITY)
Admission: RE | Admit: 2014-03-08 | Discharge: 2014-03-08 | Disposition: A | Discharge status: Home / Self Care | Payer: Medicare Other | Source: Ambulatory Visit | Attending: Pulmonary Disease | Admitting: Pulmonary Disease

## 2014-03-08 DIAGNOSIS — I658 Occlusion and stenosis of other precerebral arteries: Secondary | ICD-10-CM | POA: Diagnosis not present

## 2014-03-08 DIAGNOSIS — I6529 Occlusion and stenosis of unspecified carotid artery: Secondary | ICD-10-CM | POA: Insufficient documentation

## 2014-03-09 ENCOUNTER — Ambulatory Visit (INDEPENDENT_AMBULATORY_CARE_PROVIDER_SITE_OTHER): Payer: Medicare Other | Admitting: Ophthalmology

## 2014-03-09 DIAGNOSIS — E11319 Type 2 diabetes mellitus with unspecified diabetic retinopathy without macular edema: Secondary | ICD-10-CM

## 2014-03-09 DIAGNOSIS — E1165 Type 2 diabetes mellitus with hyperglycemia: Secondary | ICD-10-CM

## 2014-03-09 DIAGNOSIS — I10 Essential (primary) hypertension: Secondary | ICD-10-CM

## 2014-03-09 DIAGNOSIS — H34219 Partial retinal artery occlusion, unspecified eye: Secondary | ICD-10-CM

## 2014-03-09 DIAGNOSIS — E1139 Type 2 diabetes mellitus with other diabetic ophthalmic complication: Secondary | ICD-10-CM

## 2014-03-09 DIAGNOSIS — H35039 Hypertensive retinopathy, unspecified eye: Secondary | ICD-10-CM

## 2014-03-09 DIAGNOSIS — H2510 Age-related nuclear cataract, unspecified eye: Secondary | ICD-10-CM

## 2014-03-09 DIAGNOSIS — H43819 Vitreous degeneration, unspecified eye: Secondary | ICD-10-CM

## 2014-05-23 ENCOUNTER — Other Ambulatory Visit: Payer: Self-pay | Admitting: *Deleted

## 2014-05-23 MED ORDER — ENALAPRIL MALEATE 10 MG PO TABS: 10.0000 mg | ORAL_TABLET | Freq: Two times a day (BID) | ORAL | Status: DC

## 2014-05-27 ENCOUNTER — Other Ambulatory Visit: Payer: Self-pay

## 2014-05-27 DIAGNOSIS — I10 Essential (primary) hypertension: Secondary | ICD-10-CM

## 2014-05-27 MED ORDER — ENALAPRIL MALEATE 10 MG PO TABS: 10.0000 mg | ORAL_TABLET | Freq: Two times a day (BID) | ORAL | Status: DC

## 2014-05-27 MED ORDER — HYDROCHLOROTHIAZIDE 25 MG PO TABS: 25.0000 mg | ORAL_TABLET | Freq: Every day | ORAL | Status: DC

## 2014-07-11 DIAGNOSIS — Z23 Encounter for immunization: Secondary | ICD-10-CM | POA: Diagnosis not present

## 2014-07-11 DIAGNOSIS — I259 Chronic ischemic heart disease, unspecified: Secondary | ICD-10-CM | POA: Diagnosis not present

## 2014-07-11 DIAGNOSIS — I129 Hypertensive chronic kidney disease with stage 1 through stage 4 chronic kidney disease, or unspecified chronic kidney disease: Secondary | ICD-10-CM | POA: Diagnosis not present

## 2014-07-11 DIAGNOSIS — E109 Type 1 diabetes mellitus without complications: Secondary | ICD-10-CM | POA: Diagnosis not present

## 2014-07-11 DIAGNOSIS — I110 Hypertensive heart disease with heart failure: Secondary | ICD-10-CM | POA: Diagnosis not present

## 2014-07-11 DIAGNOSIS — I509 Heart failure, unspecified: Secondary | ICD-10-CM | POA: Diagnosis not present

## 2014-09-22 ENCOUNTER — Encounter (HOSPITAL_COMMUNITY): Payer: Self-pay | Admitting: Internal Medicine

## 2014-10-10 ENCOUNTER — Encounter (HOSPITAL_COMMUNITY): Payer: Self-pay | Admitting: *Deleted

## 2014-10-10 ENCOUNTER — Emergency Department (HOSPITAL_COMMUNITY): Payer: Medicare Other

## 2014-10-10 ENCOUNTER — Observation Stay (HOSPITAL_COMMUNITY)
Admission: EM | Admit: 2014-10-10 | Discharge: 2014-10-12 | Disposition: A | Discharge status: Home / Self Care | Payer: Medicare Other | Attending: Pulmonary Disease | Admitting: Pulmonary Disease

## 2014-10-10 DIAGNOSIS — Z7982 Long term (current) use of aspirin: Secondary | ICD-10-CM | POA: Insufficient documentation

## 2014-10-10 DIAGNOSIS — I10 Essential (primary) hypertension: Secondary | ICD-10-CM

## 2014-10-10 DIAGNOSIS — G458 Other transient cerebral ischemic attacks and related syndromes: Secondary | ICD-10-CM

## 2014-10-10 DIAGNOSIS — R410 Disorientation, unspecified: Secondary | ICD-10-CM | POA: Diagnosis not present

## 2014-10-10 DIAGNOSIS — I255 Ischemic cardiomyopathy: Secondary | ICD-10-CM | POA: Insufficient documentation

## 2014-10-10 DIAGNOSIS — Z7901 Long term (current) use of anticoagulants: Secondary | ICD-10-CM | POA: Diagnosis not present

## 2014-10-10 DIAGNOSIS — E119 Type 2 diabetes mellitus without complications: Secondary | ICD-10-CM | POA: Insufficient documentation

## 2014-10-10 DIAGNOSIS — I213 ST elevation (STEMI) myocardial infarction of unspecified site: Secondary | ICD-10-CM | POA: Insufficient documentation

## 2014-10-10 DIAGNOSIS — Z79899 Other long term (current) drug therapy: Secondary | ICD-10-CM | POA: Diagnosis not present

## 2014-10-10 DIAGNOSIS — R29810 Facial weakness: Secondary | ICD-10-CM | POA: Diagnosis not present

## 2014-10-10 DIAGNOSIS — I2699 Other pulmonary embolism without acute cor pulmonale: Secondary | ICD-10-CM | POA: Diagnosis not present

## 2014-10-10 DIAGNOSIS — E785 Hyperlipidemia, unspecified: Secondary | ICD-10-CM | POA: Insufficient documentation

## 2014-10-10 DIAGNOSIS — I4892 Unspecified atrial flutter: Secondary | ICD-10-CM | POA: Insufficient documentation

## 2014-10-10 DIAGNOSIS — I2510 Atherosclerotic heart disease of native coronary artery without angina pectoris: Secondary | ICD-10-CM | POA: Insufficient documentation

## 2014-10-10 DIAGNOSIS — I5040 Unspecified combined systolic (congestive) and diastolic (congestive) heart failure: Secondary | ICD-10-CM | POA: Diagnosis not present

## 2014-10-10 DIAGNOSIS — Z87891 Personal history of nicotine dependence: Secondary | ICD-10-CM | POA: Diagnosis not present

## 2014-10-10 DIAGNOSIS — G459 Transient cerebral ischemic attack, unspecified: Secondary | ICD-10-CM

## 2014-10-10 DIAGNOSIS — R0682 Tachypnea, not elsewhere classified: Secondary | ICD-10-CM | POA: Diagnosis not present

## 2014-10-10 DIAGNOSIS — E162 Hypoglycemia, unspecified: Secondary | ICD-10-CM | POA: Diagnosis present

## 2014-10-10 LAB — CBC WITH DIFFERENTIAL/PLATELET
Basophils Absolute: 0 10*3/uL (ref 0.0–0.1)
Basophils Relative: 1 % (ref 0–1)
Eosinophils Absolute: 0.1 10*3/uL (ref 0.0–0.7)
Eosinophils Relative: 2 % (ref 0–5)
HCT: 32.2 % — ABNORMAL LOW (ref 36.0–46.0)
Hemoglobin: 10.4 g/dL — ABNORMAL LOW (ref 12.0–15.0)
Lymphocytes Relative: 33 % (ref 12–46)
Lymphs Abs: 2 10*3/uL (ref 0.7–4.0)
MCH: 29.5 pg (ref 26.0–34.0)
MCHC: 32.3 g/dL (ref 30.0–36.0)
MCV: 91.5 fL (ref 78.0–100.0)
Monocytes Absolute: 0.5 10*3/uL (ref 0.1–1.0)
Monocytes Relative: 7 % (ref 3–12)
Neutro Abs: 3.6 10*3/uL (ref 1.7–7.7)
Neutrophils Relative %: 57 % (ref 43–77)
Platelets: 325 10*3/uL (ref 150–400)
RBC: 3.52 MIL/uL — ABNORMAL LOW (ref 3.87–5.11)
RDW: 13.5 % (ref 11.5–15.5)
WBC: 6.1 10*3/uL (ref 4.0–10.5)

## 2014-10-10 LAB — COMPREHENSIVE METABOLIC PANEL
ALT: 17 U/L (ref 0–35)
AST: 19 U/L (ref 0–37)
Albumin: 3.3 g/dL — ABNORMAL LOW (ref 3.5–5.2)
Alkaline Phosphatase: 53 U/L (ref 39–117)
Anion gap: 5 (ref 5–15)
BUN: 34 mg/dL — ABNORMAL HIGH (ref 6–23)
CO2: 25 mmol/L (ref 19–32)
Calcium: 8.6 mg/dL (ref 8.4–10.5)
Chloride: 109 mEq/L (ref 96–112)
Creatinine, Ser: 1.19 mg/dL — ABNORMAL HIGH (ref 0.50–1.10)
GFR calc Af Amer: 51 mL/min — ABNORMAL LOW (ref >90)
GFR calc non Af Amer: 44 mL/min — ABNORMAL LOW (ref >90)
Glucose, Bld: 197 mg/dL — ABNORMAL HIGH (ref 70–99)
Potassium: 4.2 mmol/L (ref 3.5–5.1)
Sodium: 139 mmol/L (ref 135–145)
Total Bilirubin: 0.4 mg/dL (ref 0.3–1.2)
Total Protein: 6.3 g/dL (ref 6.0–8.3)

## 2014-10-10 LAB — PROTIME-INR
INR: 1.44 (ref 0.00–1.49)
Prothrombin Time: 17.7 seconds — ABNORMAL HIGH (ref 11.6–15.2)

## 2014-10-10 LAB — TROPONIN I: Troponin I: <0.03 ng/mL (ref <0.031)

## 2014-10-10 LAB — APTT: aPTT: 32 seconds (ref 24–37)

## 2014-10-10 NOTE — ED Provider Notes (Signed)
CSN: [id number]     Arrival date & time 10/10/14  2208 History  This chart was scribed for Veryl Speak, MD by Peyton Bottoms, ED Scribe. This patient was seen in room APA01/APA01 and the patient's care was started at 11:36 PM.   Chief Complaint  Patient presents with  . Facial Droop   The history is provided by the patient. No language interpreter was used.   HPI Comments: [name] is a 73 y.o. female with a history of Ischemic cardiomyopathy, MI, diabetes, hyperlipidemia, CAD, hypertension, and atrial flutter, who presents to the Emergency Department complaining of confusion and witnessed facial droop that began earlier today. Patient's blood sugar was 144 when measured after eating a banana and vanilla wafer biscuits. She reports associated weakness. Patient seemed confused and disoriented to time earlier today. Patient reports history of plaque in cervical area. She states that she did not have onset of symptoms until earlier today at 1:00 PM. She denies any weakness to extremities. She denies any fall or head impact. She denies associated fever, chills, diarrhea, nausea, vomiting, abdominal pain, edema of legs, or dysuria. Patient states she takes Aspirin and Warfarin.   Past Medical History  Diagnosis Date  . Ischemic cardiomyopathy   . Myocardial infarction   . Diabetes mellitus without complication   . Hyperlipidemia   . Bilateral pulmonary embolism 08/2008    On coumadin  . Coronary artery disease     s/p DES to mid RCA in 02/2008; s/p inferior MI with DES to mid RCA and mid LAD in 05/2005  . Unspecified combined systolic and diastolic heart failure     Asymptomatic. Echo 04/28/13 EF= 40- 45%  . Hypertension     Controlled  . Atrial flutter     typical appearing  . Coronary atherosclerosis   . Unspecified combined systolic and diastolic heart failure    Past Surgical History  Procedure Laterality Date  . Cholecystectomy    . Hip surgery    . Atrial flutter  ablation N/A 06/17/2013    Procedure: ATRIAL FLUTTER ABLATION;  Surgeon: Thompson Grayer, MD;  Location: [facility] CATH LAB;  Service: Cardiovascular;  Laterality: N/A;   Family History  Problem Relation Age of Onset  . Heart disease    . Arthritis    . Cancer    . Diabetes     History  Substance Use Topics  . Smoking status: Former Smoker -- 10 years    Types: Cigarettes    Quit date: 10/14/1978  . Smokeless tobacco: Not on file  . Alcohol Use: No   OB History    No data available     Review of Systems  HENT:       Facial droop.  Psychiatric/Behavioral: Positive for confusion.  All other systems reviewed and are negative.  Allergies  Review of patient's allergies indicates no known allergies.  Home Medications   Prior to Admission medications   Medication Sig Start Date End Date Taking? Authorizing Provider  amLODipine (NORVASC) 5 MG tablet Take 5 mg by mouth daily.   Yes Historical Provider, MD  aspirin EC 81 MG tablet Take 81 mg by mouth daily.   Yes Historical Provider, MD  enalapril (VASOTEC) 10 MG tablet Take 1 tablet (10 mg total) by mouth 2 (two) times daily. 05/27/14  Yes Belva Crome III, MD  glipiZIDE (GLUCOTROL XL) 10 MG 24 hr tablet Take 10 mg by mouth daily with breakfast.    Yes Historical Provider, MD  hydrochlorothiazide (HYDRODIURIL) 25 MG tablet Take 1 tablet (25 mg total) by mouth daily. 05/27/14  Yes Belva Crome III, MD  metFORMIN (GLUCOPHAGE) 500 MG tablet Take 1,000 mg by mouth 2 (two) times daily with a meal.    Yes Historical Provider, MD  metoprolol (LOPRESSOR) 50 MG tablet Take 50 mg by mouth 2 (two) times daily.   Yes Historical Provider, MD  nitroGLYCERIN (NITROSTAT) 0.4 MG SL tablet Place 0.4 mg under the tongue every 5 (five) minutes as needed for chest pain. 3 tablets maximum   Yes Historical Provider, MD  Omega-3 Fatty Acids (OMEGA 3 PO) Take 1 capsule by mouth daily. [place] Historical Provider, MD  vitamin C (ASCORBIC ACID) 500 MG  tablet Take 500 mg by mouth daily.   Yes Historical Provider, MD  vitamin E 400 UNIT capsule Take 400 Units by mouth daily.   Yes Historical Provider, MD  warfarin (COUMADIN) 5 MG tablet Take 5-10 mg by mouth daily. Take 5 mg on Monday, Tuesday, Wednesday, Friday, Saturday and Sunday.  Take 10 mg on Thursday.   Yes Historical Provider, MD   Triage Vitals: BP 139/54 mmHg  Pulse 77  Temp(Src) 98.9 F (37.2 C) (Oral)  Resp 16  Ht 5\' 5"  (1.651 m)  Wt 165 lb (74.844 kg)  BMI 27.46 kg/m2  SpO2 100%  Physical Exam  Constitutional: She is oriented to person, place, and time. She appears well-developed and well-nourished. No distress.  HENT:  Head: Normocephalic and atraumatic.  Mouth/Throat: Oropharynx is clear and moist.  Eyes: Pupils are equal, round, and reactive to light.  Neck: Neck supple.  Cardiovascular: Normal rate, regular rhythm and normal heart sounds.   No murmur heard. Pulmonary/Chest: Effort normal and breath sounds normal. No respiratory distress. She has no wheezes.  Abdominal: Soft. She exhibits no distension. There is no tenderness. There is no rebound and no guarding.  Musculoskeletal: Normal range of motion. She exhibits no edema or tenderness.  Neurological: She is alert and oriented to person, place, and time. No cranial nerve deficit. She exhibits normal muscle tone. Coordination normal.  Skin: Skin is warm and dry.  Psychiatric: She has a normal mood and affect.  Nursing note and vitals reviewed.  ED Course  Procedures (including critical care time)  DIAGNOSTIC STUDIES: Oxygen Saturation is 100% on RA, normal by my interpretation.    COORDINATION OF CARE: 11:43 PM- Discussed plans to order diagnostic CT of head, EKG and lab work. Pt advised of plan for treatment and pt agrees.  Labs Review Labs Reviewed  CBC WITH DIFFERENTIAL - Abnormal; Notable for the following:    RBC 3.52 (*)    Hemoglobin 10.4 (*)    HCT 32.2 (*)    All other components within  normal limits  PROTIME-INR - Abnormal; Notable for the following:    Prothrombin Time 17.7 (*)    All other components within normal limits  APTT  TROPONIN I  COMPREHENSIVE METABOLIC PANEL   Imaging Review No results found.   EKG Interpretation   Date/Time:  Monday October 10 2014 22:20:02 EST Ventricular Rate:  78 PR Interval:  207 QRS Duration: 110 QT Interval:  380 QTC Calculation: 433 R Axis:   5 Text Interpretation:  Sinus rhythm Atrial premature complex Probable  anterior infarct, old Baseline wander in lead(s) I Confirmed by DELOS  MD,  Aseret Hoffman ([id number]) on 10/11/2014 12:43:36 AM     MDM   Final diagnoses:  Facial droop  Patient is a 73 year old female with extensive past medical history including diabetes, atrial fib, cardiomyopathy, and carotid artery disease. She presents after an episode of confusion and left-sided facial droop that occurred throughout the day. Her workup reveals a subtherapeutic INR but normal head CT and laboratory studies which are essentially unremarkable. She is at her neurologic baseline and I see no focal deficits on neurologic examination. I've discussed the case with Dr. Ernestina Patches who will evaluate and admit the patient.  I personally performed the services described in this documentation, which was scribed in my presence. The recorded information has been reviewed and is accurate.  Veryl Speak, MD 10/11/14 [phone]

## 2014-10-10 NOTE — ED Notes (Addendum)
Pt alert and oriented to age, month and year, also to place as well; pt denies decrease in sensation right compared to left

## 2014-10-10 NOTE — ED Notes (Signed)
Family reports that they came to pt's house and noted left side of face was drooping and pt was having some confusion.  Last time known well was 10-12 hours ago. Some minor weakness noted in grasp of left hand.  Tongue midline, smile slightly drooped on left side.

## 2014-10-11 ENCOUNTER — Encounter (HOSPITAL_COMMUNITY): Payer: Self-pay | Admitting: Radiology

## 2014-10-11 ENCOUNTER — Observation Stay (HOSPITAL_COMMUNITY): Payer: Medicare Other

## 2014-10-11 DIAGNOSIS — R4182 Altered mental status, unspecified: Secondary | ICD-10-CM

## 2014-10-11 DIAGNOSIS — I10 Essential (primary) hypertension: Secondary | ICD-10-CM

## 2014-10-11 DIAGNOSIS — I517 Cardiomegaly: Secondary | ICD-10-CM | POA: Diagnosis not present

## 2014-10-11 DIAGNOSIS — I4892 Unspecified atrial flutter: Secondary | ICD-10-CM

## 2014-10-11 DIAGNOSIS — I6523 Occlusion and stenosis of bilateral carotid arteries: Secondary | ICD-10-CM | POA: Diagnosis not present

## 2014-10-11 DIAGNOSIS — E119 Type 2 diabetes mellitus without complications: Secondary | ICD-10-CM

## 2014-10-11 DIAGNOSIS — R29810 Facial weakness: Secondary | ICD-10-CM | POA: Diagnosis not present

## 2014-10-11 DIAGNOSIS — G459 Transient cerebral ischemic attack, unspecified: Secondary | ICD-10-CM | POA: Diagnosis not present

## 2014-10-11 DIAGNOSIS — E162 Hypoglycemia, unspecified: Secondary | ICD-10-CM | POA: Diagnosis present

## 2014-10-11 DIAGNOSIS — I319 Disease of pericardium, unspecified: Secondary | ICD-10-CM | POA: Diagnosis not present

## 2014-10-11 LAB — CBG MONITORING, ED: Glucose-Capillary: 54 mg/dL — ABNORMAL LOW (ref 70–99)

## 2014-10-11 LAB — LIPID PANEL
Cholesterol: 134 mg/dL (ref 0–200)
HDL: 48 mg/dL (ref >39)
LDL Cholesterol: 74 mg/dL (ref 0–99)
Total CHOL/HDL Ratio: 2.8 RATIO
Triglycerides: 61 mg/dL (ref <150)
VLDL: 12 mg/dL (ref 0–40)

## 2014-10-11 LAB — GLUCOSE, CAPILLARY
Glucose-Capillary: 108 mg/dL — ABNORMAL HIGH (ref 70–99)
Glucose-Capillary: 250 mg/dL — ABNORMAL HIGH (ref 70–99)
Glucose-Capillary: 229 mg/dL — ABNORMAL HIGH (ref 70–99)

## 2014-10-11 LAB — HEMOGLOBIN A1C
Hgb A1c MFr Bld: 6.2 % — ABNORMAL HIGH (ref <5.7)
Mean Plasma Glucose: 131 mg/dL — ABNORMAL HIGH (ref <117)

## 2014-10-11 MED ORDER — HYDROCHLOROTHIAZIDE 25 MG PO TABS
25.0000 mg | ORAL_TABLET | Freq: Every day | ORAL | Status: DC
  Administered 2014-10-11 – 2014-10-12 (×2): 25 mg via ORAL
  Filled 2014-10-11 (×4): qty 1

## 2014-10-11 MED ORDER — VITAMIN E 180 MG (400 UNIT) PO CAPS
400.0000 [IU] | ORAL_CAPSULE | Freq: Every day | ORAL | Status: DC
  Administered 2014-10-11 – 2014-10-12 (×2): 400 [IU] via ORAL
  Filled 2014-10-11 (×7): qty 1

## 2014-10-11 MED ORDER — METOPROLOL TARTRATE 50 MG PO TABS
50.0000 mg | ORAL_TABLET | Freq: Two times a day (BID) | ORAL | Status: DC
  Administered 2014-10-11 – 2014-10-12 (×3): 50 mg via ORAL
  Filled 2014-10-11 (×3): qty 1

## 2014-10-11 MED ORDER — ASPIRIN EC 81 MG PO TBEC
81.0000 mg | DELAYED_RELEASE_TABLET | Freq: Every day | ORAL | Status: DC
  Administered 2014-10-11 – 2014-10-12 (×2): 81 mg via ORAL
  Filled 2014-10-11 (×2): qty 1

## 2014-10-11 MED ORDER — AMLODIPINE BESYLATE 5 MG PO TABS
5.0000 mg | ORAL_TABLET | Freq: Every day | ORAL | Status: DC
  Administered 2014-10-11 – 2014-10-12 (×2): 5 mg via ORAL
  Filled 2014-10-11 (×2): qty 1

## 2014-10-11 MED ORDER — ENALAPRIL MALEATE 5 MG PO TABS
10.0000 mg | ORAL_TABLET | Freq: Two times a day (BID) | ORAL | Status: DC
  Administered 2014-10-11 – 2014-10-12 (×3): 10 mg via ORAL
  Filled 2014-10-11: qty 2
  Filled 2014-10-11: qty 1
  Filled 2014-10-11: qty 2
  Filled 2014-10-11 (×2): qty 1
  Filled 2014-10-11: qty 2
  Filled 2014-10-11: qty 1

## 2014-10-11 MED ORDER — WARFARIN SODIUM 5 MG PO TABS
10.0000 mg | ORAL_TABLET | Freq: Once | ORAL | Status: AC
Start: 2014-10-11 — End: 2014-10-11
  Administered 2014-10-11: 10 mg via ORAL
  Filled 2014-10-11: qty 2

## 2014-10-11 MED ORDER — INSULIN ASPART 100 UNIT/ML ~~LOC~~ SOLN
0.0000 [IU] | SUBCUTANEOUS | Status: DC
  Administered 2014-10-11 (×2): 3 [IU] via SUBCUTANEOUS

## 2014-10-11 MED ORDER — NITROGLYCERIN 0.4 MG SL SUBL: 0.4000 mg | SUBLINGUAL_TABLET | SUBLINGUAL | Status: DC | PRN

## 2014-10-11 MED ORDER — SENNOSIDES-DOCUSATE SODIUM 8.6-50 MG PO TABS
1.0000 | ORAL_TABLET | Freq: Every evening | ORAL | Status: DC | PRN
  Filled 2014-10-11: qty 1

## 2014-10-11 MED ORDER — VITAMIN C 500 MG PO TABS
500.0000 mg | ORAL_TABLET | Freq: Every day | ORAL | Status: DC
  Administered 2014-10-11 – 2014-10-12 (×2): 500 mg via ORAL
  Filled 2014-10-11 (×4): qty 1

## 2014-10-11 MED ORDER — WARFARIN - PHARMACIST DOSING INPATIENT: Status: DC

## 2014-10-11 MED ORDER — STROKE: EARLY STAGES OF RECOVERY BOOK
Freq: Once | Status: AC
  Administered 2014-10-11: 12:00:00
  Filled 2014-10-11: qty 1

## 2014-10-11 NOTE — Progress Notes (Signed)
[place] for Warfarin Indication: atrial fibrillation  No Known Allergies  Patient Measurements: Height: 5\' 5"  (165.1 cm) Weight: 165 lb (74.844 kg) IBW/kg (Calculated) : 57  Vital Signs: Temp: 97.9 F (36.6 C) (12/29 0837) Temp Source: Oral (12/29 0837) BP: 135/65 mmHg (12/29 0837) Pulse Rate: 78 (12/29 0837)  Labs:  Recent Labs  10/10/14 2223  HGB 10.4*  HCT 32.2*  PLT 325  APTT 32  LABPROT 17.7*  INR 1.44  CREATININE 1.19*  TROPONINI <0.03   Estimated Creatinine Clearance: 42 mL/min (by C-G formula based on Cr of 1.19).  Medical History: Past Medical History  Diagnosis Date  . Ischemic cardiomyopathy   . Myocardial infarction   . Diabetes mellitus without complication   . Hyperlipidemia   . Bilateral pulmonary embolism 08/2008    On coumadin  . Coronary artery disease     s/p DES to mid RCA in 02/2008; s/p inferior MI with DES to mid RCA and mid LAD in 05/2005  . Unspecified combined systolic and diastolic heart failure     Asymptomatic. Echo 04/28/13 EF= 40- 45%  . Hypertension     Controlled  . Atrial flutter     typical appearing  . Coronary atherosclerosis   . Unspecified combined systolic and diastolic heart failure    Medications:   (Not in a hospital admission) PTA [place] dose 5mg  daily, except 10mg  on Thursdays.  Assessment: INR below goal, patient received home dose on 12/28.  Patient being admitted for TIA.   Cardiology note from 01/2014 states: "Continue anticoagulation therapy for at least another 6 months, we'll consider a 48 hour Holter monitor, in 5 months and discontinuation of anticoagulation at the next office visit if she has had a quiet 6 months."  Goal of Therapy:  INR 2-3   Plan:  Warfarin 10mg  PO X 1 Daily PT/INR  Biagio Quint R 10/11/2014,9:53 AM

## 2014-10-11 NOTE — Progress Notes (Signed)
UR completed 

## 2014-10-11 NOTE — ED Notes (Signed)
Pt resting quietly.  VS stable.  No distress noted.

## 2014-10-11 NOTE — Progress Notes (Signed)
  Echocardiogram 2D Echocardiogram has been performed.  [place], [place] 10/11/2014, 3:13 PM

## 2014-10-11 NOTE — ED Notes (Signed)
Patient passed bedside stroke swallow screen. Verbal order from Dr Wyline Copas to change NPO status to Heart healthy cardiac diet.

## 2014-10-11 NOTE — ED Notes (Signed)
Patient given meal tray. Patient ate breakfast with no difficulty. Patient sitting in bed alert and oriented at this time. Spouse at bedside.

## 2014-10-11 NOTE — Progress Notes (Signed)
[facility] PROGRESS NOTE  [name] [id number] DOB: 1941-08-24 DOA: 10/10/2014 PCP: Alonza Bogus, MD  Assessment/Plan: 1. Suspected TIA 1. No acute CVA noted on MRI 2. Carotid dopplers w/o stenosis 3. Pending 2d Echo 2. Aflutter/DVT 1. Rate controlled 2. On coumadin 3. CAD 1. Stable 2. Denies chest pain or sob 4. HTN 1. bp stable 2. Cont home meds 5. DM2 1. Pt reports hypoglycemic episode prior to admit, improved with glucose load prior to ed visit 2. Cont on SSI coverage 6. DVT prophylaxis 1. Therapeutic coumadin  Code Status: Full Family Communication: Pt in room (indicate person spoken with, relationship, and if by phone, the number) Disposition Plan: Pending   Consultants:    Procedures:    Antibiotics:   (indicate start date, and stop date if known)  HPI/Subjective: No acute events. Pt reports feeling well  Objective: Filed Vitals:   10/11/14 0630 10/11/14 0837 10/11/14 1042 10/11/14 1419  BP: 108/48 135/65 130/86 105/48  Pulse: 57 78 70 66  Temp:  97.9 F (36.6 C) 98 F (36.7 C) 98.7 F (37.1 C)  TempSrc:  Oral Oral Oral  Resp: 16 15 16 18   Height:   5\' 5"  (1.651 m)   Weight:   74.844 kg (165 lb)   SpO2: 94% 99% 100% 97%    Intake/Output Summary (Last 24 hours) at 10/11/14 1436 Last data filed at 10/11/14 1420  Gross per 24 hour  Intake    240 ml  Output      0 ml  Net    240 ml   Filed Weights   10/10/14 2220 10/11/14 1042  Weight: 74.844 kg (165 lb) 74.844 kg (165 lb)    Exam:   General:  Awake, in nad  Cardiovascular: regular, s1, s2  Respiratory: normal resp effort, no wheezing  Abdomen: soft,nondistended  Musculoskeletal: perfused, no clubbing   Data Reviewed: Basic Metabolic Panel:  Recent Labs Lab 10/10/14 2223  NA 139  K 4.2  CL 109  CO2 25  GLUCOSE 197*  BUN 34*  CREATININE 1.19*  CALCIUM 8.6   Liver Function Tests:  Recent Labs Lab 10/10/14 2223  AST 19  ALT 17  ALKPHOS 53   BILITOT 0.4  PROT 6.3  ALBUMIN 3.3*   No results for input(s): LIPASE, AMYLASE in the last 168 hours. No results for input(s): AMMONIA in the last 168 hours. CBC:  Recent Labs Lab 10/10/14 2223  WBC 6.1  NEUTROABS 3.6  HGB 10.4*  HCT 32.2*  MCV 91.5  PLT 325   Cardiac Enzymes:  Recent Labs Lab 10/10/14 2223  TROPONINI <0.03   BNP (last 3 results) No results for input(s): PROBNP in the last 8760 hours. CBG:  Recent Labs Lab 10/11/14 0708 10/11/14 1157  GLUCAP 54* 108*    No results found for this or any previous visit (from the past 240 hour(s)).   Studies: Dg Chest 2 View  10/11/2014   CLINICAL DATA:  Transient ischemic attack  EXAM: CHEST  2 VIEW  COMPARISON:  08/23/2008  FINDINGS: Borderline cardiomegaly which is stable from 2009. A coronary stent is again noted. Stable appearance of the aorta and hila.  Chronic interstitial coarsening without edema or pneumonia. No effusion or pneumothorax.  IMPRESSION: No acute cardiopulmonary disease.   Electronically Signed   By: Jorje Guild M.D.   On: 10/11/2014 02:58   Ct Head Wo Contrast  10/10/2014   CLINICAL DATA:  Left facial droop and confusion. Left hand numbness.  EXAM:  CT HEAD WITHOUT CONTRAST  TECHNIQUE: Contiguous axial images were obtained from the base of the skull through the vertex without intravenous contrast.  COMPARISON:  03/02/2013  FINDINGS: Skull and Sinuses:Negative for fracture or destructive process. The mastoids, middle ears, and imaged paranasal sinuses are clear.  Orbits: No acute abnormality.  Brain: No evidence of acute infarction, hemorrhage, hydrocephalus, or mass lesion/mass effect. Cerebral volume is within normal limits for age. There is mild, age expected small vessel change around the lateral ventricles.  IMPRESSION: Negative.  No intracranial hemorrhage or visible infarct.   Electronically Signed   By: Jorje Guild M.D.   On: 10/10/2014 23:45   Mr Jodene Nam Head Wo Contrast  10/11/2014    CLINICAL DATA:  TIA.  EXAM: MRA HEAD WITHOUT CONTRAST  TECHNIQUE: Angiographic images of the Circle of Willis were obtained using MRA technique without intravenous contrast.  COMPARISON:  MRI head 10/11/2014  FINDINGS: Right vertebral artery widely patent to the basilar. Right vertebral dominant. Left vertebral artery ends in PICA. Basilar widely patent. AICA, superior cerebellar, posterior cerebral arteries are patent bilaterally. Posterior communicating artery patent bilaterally.  Decreased signal in the cavernous carotid bilaterally likely due to flow related artifact however possible atherosclerotic disease could be present. Anterior and middle cerebral arteries are widely patent without significant stenosis.  Negative for cerebral aneurysm.  IMPRESSION: Decreased signal in the cavernous carotid bilaterally likely from related to flow related artifact however there may be an element of atherosclerotic disease. Otherwise negative.   Electronically Signed   By: Franchot Gallo M.D.   On: 10/11/2014 08:53   Mr Brain Wo Contrast  10/11/2014   CLINICAL DATA:  TIA.  Confusion.  Possible episode of facial droop.  EXAM: MRI HEAD WITHOUT CONTRAST  TECHNIQUE: Multiplanar, multiecho pulse sequences of the brain and surrounding structures were obtained without intravenous contrast.  COMPARISON:  CT head without contrast 10/10/2014.  FINDINGS: The diffusion-weighted images demonstrate no evidence for acute or subacute infarction. A focal area of susceptibility adjacent to the posterior left lateral ventricle is likely related to remote hemorrhage. There is asymmetric white matter change in this area as well. Moderate diffuse periventricular and scattered subcortical white matter changes are present bilaterally, otherwise fairly symmetric.  No acute hemorrhage or mass lesion is present. The ventricles are proportionate to the degree of atrophy. No significant extra-axial fluid collection is present.  There is no flow  evident in the distal left vertebral artery which is hypoplastic on the MRA. Flow is otherwise present in the major intracranial arteries. The globes and orbits are intact. The paranasal sinuses and mastoid air cells are clear.  IMPRESSION: 1. No acute intracranial abnormality. 2. Moderate white matter changes. This likely reflects the sequela of chronic microvascular ischemia. 3. Focal susceptibility adjacent to the left lateral ventricle, likely representing remote petechial hemorrhage.   Electronically Signed   By: Lawrence Santiago M.D.   On: 10/11/2014 09:05   US Carotid Bilateral  10/11/2014   CLINICAL DATA:  TIA a yesterday and history of hypertension, syncope, diabetes and hyperlipidemia.  EXAM: BILATERAL CAROTID DUPLEX ULTRASOUND  TECHNIQUE: Pearline Cables scale imaging, color Doppler and duplex ultrasound were performed of bilateral carotid and vertebral arteries in the neck.  COMPARISON:  Multiple prior carotid duplex studies with the latest dated 03/08/2014.  FINDINGS: Criteria: Quantification of carotid stenosis is based on velocity parameters that correlate the residual internal carotid diameter with NASCET-based stenosis levels, using the diameter of the distal internal carotid lumen as the denominator for  stenosis measurement.  The following velocity measurements were obtained:  RIGHT  ICA:  130/21 cm/sec  CCA:  [id number] cm/sec  SYSTOLIC ICA/CCA RATIO:  1.3  DIASTOLIC ICA/CCA RATIO:  1.2  ECA:  145 cm/sec  LEFT  ICA:  183/36 cm/sec  CCA:  [id number] cm/sec  SYSTOLIC ICA/CCA RATIO:  2.2  DIASTOLIC ICA/CCA RATIO:  3.2  ECA:  106 cm/sec  RIGHT CAROTID ARTERY: Similar appearance of heterogeneous and predominately calcified plaque at the level of the common carotid artery, carotid bulb and proximal ICA. Proximal ICA velocity is normal. There is mild elevation in velocity of the mid ICA in the neck had a segment of mild tortuosity. There may be a near 50% stenosis at this level, but there is no significant progression of  disease since the most recent ultrasound.  RIGHT VERTEBRAL ARTERY: Antegrade flow with normal waveform and velocity.  LEFT CAROTID ARTERY: Stable heterogeneous and calcified plaque at the level of the distal common carotid artery, carotid bulb and proximal ICA. Velocity elevation is stable in the proximal ICA and corresponds to an estimated 50- 69% stenosis.  LEFT VERTEBRAL ARTERY: Antegrade flow with normal waveform and velocity.  IMPRESSION: Essentially stable carotid duplex ultrasound. Estimated 50- 69% left proximal ICA stenosis. Proximal right ICA demonstrates less than 50% estimated stenosis. There may be a near 50% stenosis in the right mid ICA of the neck. However, mild velocity elevation at this level could also be secondary to tortuosity.   Electronically Signed   By: Aletta Edouard M.D.   On: 10/11/2014 10:52    Scheduled Meds: . amLODipine  5 mg Oral Daily  . aspirin EC  81 mg Oral Daily  . enalapril  10 mg Oral BID  . hydrochlorothiazide  25 mg Oral Daily  . insulin aspart  0-9 Units Subcutaneous 6 times per day  . metoprolol  50 mg Oral BID  . vitamin C  500 mg Oral Daily  . vitamin E  400 Units Oral Daily  . warfarin  10 mg Oral Once  . [START ON 10/12/2014] Warfarin - Pharmacist Dosing Inpatient   Does not apply Q24H   Continuous Infusions:   Active Problems:   TIA (transient ischemic attack)  Time spent: 56min  Austynn Pridmore, [facility] Pager [phone]. If 7PM-7AM, please contact night-coverage at [email or web address], password [facility] 10/11/2014, 2:36 PM  LOS: 1 day

## 2014-10-11 NOTE — ED Notes (Signed)
Patient ambulated to bathroom with no assistance or difficulty. Gait steady.

## 2014-10-11 NOTE — H&P (Signed)
Hospitalist Admission History and Physical  Patient name: [place] record number: [id number] Date of birth: [birth year] Age: 73 y.o. Gender: female  Primary Care Provider: Alonza Bogus, MD  Chief Complaint: tia  History of Present Illness:This is a 73 y.o. year old female with significant past medical history of CAD s/p stent, atrial flutter-recurrent DVT on coumadin, type 2 DM, HTN, ischemic cardiomyopathy presenting with ? TIA. Per daughter, she called her mom earlier today with patient had persistent confusion. Daughter then went to evaluate patient home where she was still mildly confused. EMS was called to the home. Patient was given crackers and juice to eat. Had his sugar checked in the 150s and EMS left. Per the daughter, patient's grandson saw patient later in the day and noted patient with facial droop. Patient denies any prior history of stroke in the past. Has not been having her INR checked for her atrial fibrillation/recurrent DVT. Patient denies any chest pain, shortness of breath, and nausea, diaphoresis. No fevers or chills. Present to the emergency room temperature 98.9, heart rate in the 50s to 80s, respirations in the tens, blood pressure in the 110s to 150s over 60s to 100s, satting 97% on room air. White blood cell count 6.1, hemoglobin 10.4, creatinine 1.19, glucose 197. Troponin negative 1. EKG sinus rhythm with PACs.  INR 1.44. Head CT within normal limits.  Assessment and Plan: JACOBA BELKEN is a 73 y.o. year old female presenting with TIA   Active Problems:   TIA (transient ischemic attack)   1- TIA  -proceed down stroke pathway including MRI, MRA, 2D ECHO, carotid dopplers, risk stratification labs  -currently asymptomatic -cont ASA -tele bed   2- Atrial Flutter/DVT -INR subtherapeutic  -coumadin per pharmacy  -No CP, SOB, LE swelling/tenderness  3- CAD/ischemic cardiomyopathy/HTN -no active CP  -euvolemic clinically  -cont home  regimen  -2D ECHO   4- type 2 DM  -SSI  -A1C  -hold oral regimen   FEN/GI: heart healthy/carb modified diet pending formal bedside swallow eval  Prophylaxis: coumading  Disposition: pending further evaluation  Code Status:Full Code    Patient Active Problem List   Diagnosis Date Noted  . TIA (transient ischemic attack) 10/11/2014  . Encounter for therapeutic drug monitoring 12/01/2013  . Atrial flutter 06/09/2013  . Essential hypertension 06/09/2013  . CAD (coronary artery disease) 06/09/2013  . CLOSED FRACTURE OF SURGICAL NECK OF HUMERUS 04/12/2010  . HIP, ARTHRITIS, DEGEN./OSTEO 03/01/2009  . HIP PAIN 03/01/2009   Past Medical History: Past Medical History  Diagnosis Date  . Ischemic cardiomyopathy   . Myocardial infarction   . Diabetes mellitus without complication   . Hyperlipidemia   . Bilateral pulmonary embolism 08/2008    On coumadin  . Coronary artery disease     s/p DES to mid RCA in 02/2008; s/p inferior MI with DES to mid RCA and mid LAD in 05/2005  . Unspecified combined systolic and diastolic heart failure     Asymptomatic. Echo 04/28/13 EF= 40- 45%  . Hypertension     Controlled  . Atrial flutter     typical appearing  . Coronary atherosclerosis   . Unspecified combined systolic and diastolic heart failure     Past Surgical History: Past Surgical History  Procedure Laterality Date  . Cholecystectomy    . Hip surgery    . Atrial flutter ablation N/A 06/17/2013    Procedure: ATRIAL FLUTTER ABLATION;  Surgeon: Thompson Grayer, MD;  Location: [facility] CATH LAB;  Service: Cardiovascular;  Laterality: N/A;    Social History: History   Social History  . Marital Status: Married    Spouse Name: N/A    Number of Children: 3  . Years of Education: N/A   Social History Main Topics  . Smoking status: Former Smoker -- 10 years    Types: Cigarettes    Quit date: 10/14/1978  . Smokeless tobacco: None  . Alcohol Use: No  . Drug Use: No  . Sexual Activity: None    Other Topics Concern  . None   Social History Narrative    Family History: Family History  Problem Relation Age of Onset  . Heart disease    . Arthritis    . Cancer    . Diabetes      Allergies: No Known Allergies  Current Facility-Administered Medications  Medication Dose Route Frequency Provider Last Rate Last Dose  .  stroke: mapping our early stages of recovery book   Does not apply Once Shanda Howells, MD      . amLODipine (NORVASC) tablet 5 mg  5 mg Oral Daily Shanda Howells, MD      . aspirin EC tablet 81 mg  81 mg Oral Daily Shanda Howells, MD      . enalapril (VASOTEC) tablet 10 mg  10 mg Oral BID Shanda Howells, MD      . hydrochlorothiazide (HYDRODIURIL) tablet 25 mg  25 mg Oral Daily Shanda Howells, MD      . insulin aspart (novoLOG) injection 0-9 Units  0-9 Units Subcutaneous 6 times per day Shanda Howells, MD      . metoprolol (LOPRESSOR) tablet 50 mg  50 mg Oral BID Shanda Howells, MD      . nitroGLYCERIN (NITROSTAT) SL tablet 0.4 mg  0.4 mg Sublingual Q5 min PRN Shanda Howells, MD      . senna-docusate (Senokot-S) tablet 1 tablet  1 tablet Oral QHS PRN Shanda Howells, MD      . vitamin C (ASCORBIC ACID) tablet 500 mg  500 mg Oral Daily Shanda Howells, MD      . vitamin E capsule 400 Units  400 Units Oral Daily Shanda Howells, MD       Current Outpatient Prescriptions  Medication Sig Dispense Refill  . amLODipine (NORVASC) 5 MG tablet Take 5 mg by mouth daily.    Marland Kitchen aspirin EC 81 MG tablet Take 81 mg by mouth daily.    . enalapril (VASOTEC) 10 MG tablet Take 1 tablet (10 mg total) by mouth 2 (two) times daily. 180 tablet 0  . glipiZIDE (GLUCOTROL XL) 10 MG 24 hr tablet Take 10 mg by mouth daily with breakfast.     . hydrochlorothiazide (HYDRODIURIL) 25 MG tablet Take 1 tablet (25 mg total) by mouth daily. 90 tablet 0  . metFORMIN (GLUCOPHAGE) 500 MG tablet Take 1,000 mg by mouth 2 (two) times daily with a meal.     . metoprolol (LOPRESSOR) 50 MG tablet Take 50 mg by  mouth 2 (two) times daily.    . nitroGLYCERIN (NITROSTAT) 0.4 MG SL tablet Place 0.4 mg under the tongue every 5 (five) minutes as needed for chest pain. 3 tablets maximum    . Omega-3 Fatty Acids (OMEGA 3 PO) Take 1 capsule by mouth daily. Omega Red Krill    . vitamin C (ASCORBIC ACID) 500 MG tablet Take 500 mg by mouth daily.    . vitamin E 400 UNIT capsule Take 400 Units by mouth daily.    Marland Kitchen  warfarin (COUMADIN) 5 MG tablet Take 5-10 mg by mouth daily. Take 5 mg on Monday, Tuesday, Wednesday, Friday, Saturday and Sunday.  Take 10 mg on Thursday.     Review Of Systems: 12 point ROS negative except as noted above in HPI.  Physical Exam: Filed Vitals:   10/11/14 0100  BP: 121/54  Pulse: 68  Temp:   Resp: 13    General: alert and cooperative HEENT: PERRLA and extra ocular movement intact Heart: S1, S2 normal, no murmur, rub or gallop, regular rate and rhythm Lungs: clear to auscultation, no wheezes or rales and unlabored breathing Abdomen: abdomen is soft without significant tenderness, masses, organomegaly or guarding Extremities: extremities normal, atraumatic, no cyanosis or edema Skin:no rashes Neurology: normal without focal findings  Labs and Imaging: Lab Results  Component Value Date/Time   NA 139 10/10/2014 10:23 PM   K 4.2 10/10/2014 10:23 PM   CL 109 10/10/2014 10:23 PM   CO2 25 10/10/2014 10:23 PM   BUN 34* 10/10/2014 10:23 PM   CREATININE 1.19* 10/10/2014 10:23 PM   GLUCOSE 197* 10/10/2014 10:23 PM   Lab Results  Component Value Date   WBC 6.1 10/10/2014   HGB 10.4* 10/10/2014   HCT 32.2* 10/10/2014   MCV 91.5 10/10/2014   PLT 325 10/10/2014    Ct Head Wo Contrast  10/10/2014   CLINICAL DATA:  Left facial droop and confusion. Left hand numbness.  EXAM: CT HEAD WITHOUT CONTRAST  TECHNIQUE: Contiguous axial images were obtained from the base of the skull through the vertex without intravenous contrast.  COMPARISON:  03/02/2013  FINDINGS: Skull and  Sinuses:Negative for fracture or destructive process. The mastoids, middle ears, and imaged paranasal sinuses are clear.  Orbits: No acute abnormality.  Brain: No evidence of acute infarction, hemorrhage, hydrocephalus, or mass lesion/mass effect. Cerebral volume is within normal limits for age. There is mild, age expected small vessel change around the lateral ventricles.  IMPRESSION: Negative.  No intracranial hemorrhage or visible infarct.   Electronically Signed   By: Jorje Guild M.D.   On: 10/10/2014 23:45           Shanda Howells MD  Pager: [phone]

## 2014-10-11 NOTE — ED Notes (Signed)
Pt reports recently loosing weight and not having BP or diabetes medications adjusted following weight loss.  Pt believes that may have contributed to symptoms experienced previously.  Denies any concerns or problems at present time.  Resting quietly.  No distress noted.

## 2014-10-12 DIAGNOSIS — R29810 Facial weakness: Secondary | ICD-10-CM | POA: Diagnosis not present

## 2014-10-12 DIAGNOSIS — E162 Hypoglycemia, unspecified: Secondary | ICD-10-CM | POA: Diagnosis not present

## 2014-10-12 LAB — GLUCOSE, CAPILLARY
Glucose-Capillary: 106 mg/dL — ABNORMAL HIGH (ref 70–99)
Glucose-Capillary: 111 mg/dL — ABNORMAL HIGH (ref 70–99)
Glucose-Capillary: 118 mg/dL — ABNORMAL HIGH (ref 70–99)
Glucose-Capillary: 263 mg/dL — ABNORMAL HIGH (ref 70–99)
Glucose-Capillary: 68 mg/dL — ABNORMAL LOW (ref 70–99)

## 2014-10-12 LAB — PROTIME-INR
INR: 1.68 — ABNORMAL HIGH (ref 0.00–1.49)
Prothrombin Time: 20 seconds — ABNORMAL HIGH (ref 11.6–15.2)

## 2014-10-12 MED ORDER — METFORMIN HCL 500 MG PO TABS: 500.0000 mg | ORAL_TABLET | Freq: Two times a day (BID) | ORAL | Status: DC

## 2014-10-12 NOTE — Care Management Note (Signed)
    Page 1 of 1   10/12/2014     9:28:22 AM CARE MANAGEMENT NOTE 10/12/2014  Patient:  [name], [name]   Account Number:  [id number]  Date Initiated:  10/12/2014  Documentation initiated by:  Theophilus Kinds  Subjective/Objective Assessment:   Pt admitted from home with TIA. Pt lives with her husband and will return home at discharge. Pt is independent with ADL's.     Action/Plan:   No CM needs noted. Pt discharged home today.   Anticipated DC Date:  10/12/2014   Anticipated DC Plan:  [place]  CM consult      Choice offered to / List presented to:             Status of service:  Completed, signed off Medicare Important Message given?   (If response is "NO", the following Medicare IM given date fields will be blank) Date Medicare IM given:   Medicare IM given by:   Date Additional Medicare IM given:   Additional Medicare IM given by:    Discharge Disposition:  HOME/SELF CARE  Per UR Regulation:    If discussed at Long Length of Stay Meetings, dates discussed:    Comments:  10/12/14 0920 Christinia Gully, RN BSN CM

## 2014-10-12 NOTE — Progress Notes (Signed)
[name] discharged home per MD order.  Discharge instructions reviewed and discussed with the patient and daughter at bedside, all questions and concerns answered. Copy of instructions and scripts given to patient.    Medication List    STOP taking these medications        glipiZIDE 10 MG 24 hr tablet  Commonly known as:  GLUCOTROL XL      TAKE these medications        amLODipine 5 MG tablet  Commonly known as:  NORVASC  Take 5 mg by mouth daily.     aspirin EC 81 MG tablet  Take 81 mg by mouth daily.     enalapril 10 MG tablet  Commonly known as:  VASOTEC  Take 1 tablet (10 mg total) by mouth 2 (two) times daily.     hydrochlorothiazide 25 MG tablet  Commonly known as:  HYDRODIURIL  Take 1 tablet (25 mg total) by mouth daily.     metFORMIN 500 MG tablet  Commonly known as:  GLUCOPHAGE  Take 1 tablet (500 mg total) by mouth 2 (two) times daily with a meal.     metoprolol 50 MG tablet  Commonly known as:  LOPRESSOR  Take 50 mg by mouth 2 (two) times daily.     nitroGLYCERIN 0.4 MG SL tablet  Commonly known as:  NITROSTAT  Place 0.4 mg under the tongue every 5 (five) minutes as needed for chest pain. 3 tablets maximum     OMEGA 3 PO  Take 1 capsule by mouth daily. Omega Red Krill     vitamin C 500 MG tablet  Commonly known as:  ASCORBIC ACID  Take 500 mg by mouth daily.     vitamin E 400 UNIT capsule  Take 400 Units by mouth daily.     warfarin 5 MG tablet  Commonly known as:  COUMADIN  Take 5-10 mg by mouth daily. Take 5 mg on Monday, Tuesday, Wednesday, Friday, Saturday and Sunday.  Take 10 mg on Thursday.        Patients skin is clean, dry and intact, no evidence of skin break down. IV site discontinued and catheter remains intact. Site without signs and symptoms of complications. Dressing and pressure applied.  Patient escorted to car by Ubaldo Glassing, NT in a wheelchair,  no distress noted upon discharge.  [name] 10/12/2014 12:19 PM

## 2014-10-12 NOTE — Progress Notes (Signed)
SLP Cancellation Note  Patient Details Name: [name] MRN: [id number] DOB: 10/13/1941   Cancelled treatment:       Reason Eval/Treat Not Completed: SLP screened, no needs identified, will sign off   Juan Quam Laurice 10/12/2014, 10:11 AM

## 2014-10-12 NOTE — Progress Notes (Signed)
I have assumed her care. She was left on the hospitalist service by mistake. It appears to me that her symptoms are related to hypoglycemia not to TIA. She is going to be discharged home today with modification of her medications please see discharge summary for details

## 2014-10-12 NOTE — Evaluation (Addendum)
Occupational Therapy Evaluation Patient Details Name: [name] MRN: [id number] DOB: [birth year] Today's Date: 10/12/2014    History of Present Illness 73 y.o. year old female with significant past medical history of CAD s/p stent, atrial flutter-recurrent DVT on coumadin, type 2 DM, HTN, ischemic cardiomyopathy presenting with TIA symptoms. Patient's daughter called and patient was confused and did not know where she was. Daughter brought patient into ER to be evaluated.    Clinical Impression   Patient in bed upon therapy arrival. Doctor arrived shortly afterwards and reported that patient did not experience a TIA and instead the issue was with her dramatic drops and increases of blood sugar. Patient reports that she feels much better than when she was first admitted. Patient has not concerns about returning home at discharge.    Follow Up Recommendations  No OT follow up    Equipment Recommendations  None recommended by OT       Precautions / Restrictions Precautions Precautions: None      Mobility Bed Mobility Overal bed mobility: Independent                Transfers Overall transfer level: Independent                         ADL Overall ADL's : Modified independent                                       General ADL Comments: ADLs completed with increased time as needed.               Pertinent Vitals/Pain Pain Assessment: No/denies pain     Hand Dominance Right   Extremity/Trunk Assessment Upper Extremity Assessment Upper Extremity Assessment: Overall WFL for tasks assessed   Lower Extremity Assessment Lower Extremity Assessment: Defer to PT evaluation       Communication Communication Communication: No difficulties   Cognition Arousal/Alertness: Awake/alert Behavior During Therapy: WFL for tasks assessed/performed Overall Cognitive Status: Within Functional Limits for tasks assessed                                 Home Living Family/patient expects to be discharged to:: Private residence Living Arrangements: Spouse/significant other Available Help at Discharge: Family;Available PRN/intermittently Type of Home: House Home Access: Ramped entrance;Stairs to enter Entrance Stairs-Number of Steps: in the back - ramped entrance. 2 steps from the front.   Home Layout: One level     Bathroom Shower/Tub: Astronomer Accessibility: Yes How Accessible: Accessible via walker     Additional Comments: Husband uses other bathroom with tub shower, grab bars, and tub bench, wheelchair, and 2 wheeled walker.      Prior Functioning/Environment Level of Independence: Independent                            Barriers to D/C:   None             End of Session    Activity Tolerance: Patient tolerated treatment well Patient left: in chair;with call bell/phone within reach   Time: 0829-0848 OT Time Calculation (min): 19 min Charges:  OT General Charges $OT Visit: 1 Procedure OT Evaluation $Initial OT Evaluation Tier I: 1 Procedure G-Codes: OT G-codes **NOT FOR  INPATIENT CLASS** Functional Assessment Tool Used: Clinical observation Functional Limitation: Self care Self Care Current Status [id number]): At least 1 percent but less than 20 percent impaired, limited or restricted Self Care Goal Status:CI: At least 1 percent but less than 20 percent impaired, limited or restricted Self Care Discharge Status [phone]): At least 1 percent but less than 20 percent impaired, limited or restricted  Ailene Ravel, OTR/L,CBIS  [phone]  10/12/2014, 9:17 AM

## 2014-10-12 NOTE — Progress Notes (Addendum)
Pt blood sugar was 68. Initiated hypoglycemic protocol. Pt given a snack. Blood sugar rechecked and is now 118. No medication required at this time. Will continue to monitor pt.

## 2014-10-13 NOTE — Discharge Summary (Signed)
Physician Discharge Summary  Patient ID: [name] MRN: [id number] DOB/AGE: 1941-03-13 73 y.o. Primary Care Physician:Ragena Fiola L, MD Admit date: 10/10/2014 Discharge date: 10/13/2014    Discharge Diagnoses:   Active Problems:   Hypoglycemia   Facial droop hypertension Coronary artery  Disease Diabetes type 2    Medication List    STOP taking these medications        glipiZIDE 10 MG 24 hr tablet  Commonly known as:  GLUCOTROL XL      TAKE these medications        amLODipine 5 MG tablet  Commonly known as:  NORVASC  Take 5 mg by mouth daily.     aspirin EC 81 MG tablet  Take 81 mg by mouth daily.     enalapril 10 MG tablet  Commonly known as:  VASOTEC  Take 1 tablet (10 mg total) by mouth 2 (two) times daily.     hydrochlorothiazide 25 MG tablet  Commonly known as:  HYDRODIURIL  Take 1 tablet (25 mg total) by mouth daily.     metFORMIN 500 MG tablet  Commonly known as:  GLUCOPHAGE  Take 1 tablet (500 mg total) by mouth 2 (two) times daily with a meal.     metoprolol 50 MG tablet  Commonly known as:  LOPRESSOR  Take 50 mg by mouth 2 (two) times daily.     nitroGLYCERIN 0.4 MG SL tablet  Commonly known as:  NITROSTAT  Place 0.4 mg under the tongue every 5 (five) minutes as needed for chest pain. 3 tablets maximum     OMEGA 3 PO  Take 1 capsule by mouth daily. Omega Red Krill     vitamin C 500 MG tablet  Commonly known as:  ASCORBIC ACID  Take 500 mg by mouth daily.     vitamin E 400 UNIT capsule  Take 400 Units by mouth daily.     warfarin 5 MG tablet  Commonly known as:  COUMADIN  Take 5-10 mg by mouth daily. Take 5 mg on Monday, Tuesday, Wednesday, Friday, Saturday and Sunday.  Take 10 mg on Thursday.        Discharged Condition: Improved    Consults: None  Significant Diagnostic Studies: Dg Chest 2 View  10/11/2014   CLINICAL DATA:  Transient ischemic attack  EXAM: CHEST  2 VIEW  COMPARISON:  08/23/2008  FINDINGS:  Borderline cardiomegaly which is stable from 2009. A coronary stent is again noted. Stable appearance of the aorta and hila.  Chronic interstitial coarsening without edema or pneumonia. No effusion or pneumothorax.  IMPRESSION: No acute cardiopulmonary disease.   Electronically Signed   By: Jorje Guild M.D.   On: 10/11/2014 02:58   Ct Head Wo Contrast  10/10/2014   CLINICAL DATA:  Left facial droop and confusion. Left hand numbness.  EXAM: CT HEAD WITHOUT CONTRAST  TECHNIQUE: Contiguous axial images were obtained from the base of the skull through the vertex without intravenous contrast.  COMPARISON:  03/02/2013  FINDINGS: Skull and Sinuses:Negative for fracture or destructive process. The mastoids, middle ears, and imaged paranasal sinuses are clear.  Orbits: No acute abnormality.  Brain: No evidence of acute infarction, hemorrhage, hydrocephalus, or mass lesion/mass effect. Cerebral volume is within normal limits for age. There is mild, age expected small vessel change around the lateral ventricles.  IMPRESSION: Negative.  No intracranial hemorrhage or visible infarct.   Electronically Signed   By: Jorje Guild M.D.   On: 10/10/2014 23:45   Mr  Mra Head Wo Contrast  10/11/2014   CLINICAL DATA:  TIA.  EXAM: MRA HEAD WITHOUT CONTRAST  TECHNIQUE: Angiographic images of the Circle of Willis were obtained using MRA technique without intravenous contrast.  COMPARISON:  MRI head 10/11/2014  FINDINGS: Right vertebral artery widely patent to the basilar. Right vertebral dominant. Left vertebral artery ends in PICA. Basilar widely patent. AICA, superior cerebellar, posterior cerebral arteries are patent bilaterally. Posterior communicating artery patent bilaterally.  Decreased signal in the cavernous carotid bilaterally likely due to flow related artifact however possible atherosclerotic disease could be present. Anterior and middle cerebral arteries are widely patent without significant stenosis.  Negative  for cerebral aneurysm.  IMPRESSION: Decreased signal in the cavernous carotid bilaterally likely from related to flow related artifact however there may be an element of atherosclerotic disease. Otherwise negative.   Electronically Signed   By: Franchot Gallo M.D.   On: 10/11/2014 08:53   Mr Brain Wo Contrast  10/11/2014   CLINICAL DATA:  TIA.  Confusion.  Possible episode of facial droop.  EXAM: MRI HEAD WITHOUT CONTRAST  TECHNIQUE: Multiplanar, multiecho pulse sequences of the brain and surrounding structures were obtained without intravenous contrast.  COMPARISON:  CT head without contrast 10/10/2014.  FINDINGS: The diffusion-weighted images demonstrate no evidence for acute or subacute infarction. A focal area of susceptibility adjacent to the posterior left lateral ventricle is likely related to remote hemorrhage. There is asymmetric white matter change in this area as well. Moderate diffuse periventricular and scattered subcortical white matter changes are present bilaterally, otherwise fairly symmetric.  No acute hemorrhage or mass lesion is present. The ventricles are proportionate to the degree of atrophy. No significant extra-axial fluid collection is present.  There is no flow evident in the distal left vertebral artery which is hypoplastic on the MRA. Flow is otherwise present in the major intracranial arteries. The globes and orbits are intact. The paranasal sinuses and mastoid air cells are clear.  IMPRESSION: 1. No acute intracranial abnormality. 2. Moderate white matter changes. This likely reflects the sequela of chronic microvascular ischemia. 3. Focal susceptibility adjacent to the left lateral ventricle, likely representing remote petechial hemorrhage.   Electronically Signed   By: Lawrence Santiago M.D.   On: 10/11/2014 09:05   US Carotid Bilateral  10/11/2014   CLINICAL DATA:  TIA a yesterday and history of hypertension, syncope, diabetes and hyperlipidemia.  EXAM: BILATERAL CAROTID DUPLEX  ULTRASOUND  TECHNIQUE: Pearline Cables scale imaging, color Doppler and duplex ultrasound were performed of bilateral carotid and vertebral arteries in the neck.  COMPARISON:  Multiple prior carotid duplex studies with the latest dated 03/08/2014.  FINDINGS: Criteria: Quantification of carotid stenosis is based on velocity parameters that correlate the residual internal carotid diameter with NASCET-based stenosis levels, using the diameter of the distal internal carotid lumen as the denominator for stenosis measurement.  The following velocity measurements were obtained:  RIGHT  ICA:  130/21 cm/sec  CCA:  [id number] cm/sec  SYSTOLIC ICA/CCA RATIO:  1.3  DIASTOLIC ICA/CCA RATIO:  1.2  ECA:  145 cm/sec  LEFT  ICA:  183/36 cm/sec  CCA:  [id number] cm/sec  SYSTOLIC ICA/CCA RATIO:  2.2  DIASTOLIC ICA/CCA RATIO:  3.2  ECA:  106 cm/sec  RIGHT CAROTID ARTERY: Similar appearance of heterogeneous and predominately calcified plaque at the level of the common carotid artery, carotid bulb and proximal ICA. Proximal ICA velocity is normal. There is mild elevation in velocity of the mid ICA in the neck had a segment of  mild tortuosity. There may be a near 50% stenosis at this level, but there is no significant progression of disease since the most recent ultrasound.  RIGHT VERTEBRAL ARTERY: Antegrade flow with normal waveform and velocity.  LEFT CAROTID ARTERY: Stable heterogeneous and calcified plaque at the level of the distal common carotid artery, carotid bulb and proximal ICA. Velocity elevation is stable in the proximal ICA and corresponds to an estimated 50- 69% stenosis.  LEFT VERTEBRAL ARTERY: Antegrade flow with normal waveform and velocity.  IMPRESSION: Essentially stable carotid duplex ultrasound. Estimated 50- 69% left proximal ICA stenosis. Proximal right ICA demonstrates less than 50% estimated stenosis. There may be a near 50% stenosis in the right mid ICA of the neck. However, mild velocity elevation at this level could also be  secondary to tortuosity.   Electronically Signed   By: Aletta Edouard M.D.   On: 10/11/2014 10:52    Lab Results: Basic Metabolic Panel:  Recent Labs  10/10/14 2223  NA 139  K 4.2  CL 109  CO2 25  GLUCOSE 197*  BUN 34*  CREATININE 1.19*  CALCIUM 8.6   Liver Function Tests:  Recent Labs  10/10/14 2223  AST 19  ALT 17  ALKPHOS 53  BILITOT 0.4  PROT 6.3  ALBUMIN 3.3*     CBC:  Recent Labs  10/10/14 2223  WBC 6.1  NEUTROABS 3.6  HGB 10.4*  HCT 32.2*  MCV 91.5  PLT 325    No results found for this or any previous visit (from the past 240 hour(s)).   Hospital Course: This is a 73 year old who had garbled speech on the day of admission. She kept repeating herself and her speech was difficult to understand. Her daughter-in-law came to see her and EMS was called. They were concerned that she might have hypoglycemia and while EMS was on the way she had several sugar-containing compounds but did not have blood sugar checked. By the time EMS got there her blood sugar was over 100. Her speech difficulties seemed to respond fairly quickly. She underwent TIA evaluation with echocardiogram carotid study and CT none of which showed a cause of altered mental status. She continued to have some trouble with relatively low blood sugars in the hospital but no symptoms. On the day of discharge she was awake and alert and in no distress. She has lost a significant amount of weight intentionally so it is felt that perhaps part of the problem is that her dose of medications for blood sugar are now too much for her with the weight loss.  Discharge Exam: Blood pressure 104/58, pulse 69, temperature 98.5 F (36.9 C), temperature source Oral, resp. rate 20, height 5\' 5"  (1.651 m), weight 74.844 kg (165 lb), SpO2 95 %. She is awake and alert. No neurological abnormalities. Her chest is clear. Heart is regular  Disposition: Home . She will discontinue glipizide and reduce her metformin to 500  mg twice a day. I told her at this point if her sugar goes up some that can be treated as an outpatient but it's better than having her with a low blood sugar      Discharge Instructions    Discharge patient    Complete by:  As directed              Signed: Sai Moura L   10/13/2014, 7:46 AM

## 2014-10-17 ENCOUNTER — Other Ambulatory Visit (HOSPITAL_COMMUNITY): Payer: Self-pay | Admitting: Pulmonary Disease

## 2014-10-17 DIAGNOSIS — Z1231 Encounter for screening mammogram for malignant neoplasm of breast: Secondary | ICD-10-CM

## 2014-10-19 DIAGNOSIS — E119 Type 2 diabetes mellitus without complications: Secondary | ICD-10-CM | POA: Diagnosis not present

## 2014-10-19 DIAGNOSIS — I10 Essential (primary) hypertension: Secondary | ICD-10-CM | POA: Diagnosis not present

## 2014-10-19 DIAGNOSIS — I2510 Atherosclerotic heart disease of native coronary artery without angina pectoris: Secondary | ICD-10-CM | POA: Diagnosis not present

## 2014-10-19 DIAGNOSIS — I482 Chronic atrial fibrillation: Secondary | ICD-10-CM | POA: Diagnosis not present

## 2014-10-21 ENCOUNTER — Ambulatory Visit (HOSPITAL_COMMUNITY)
Admission: RE | Admit: 2014-10-21 | Discharge: 2014-10-21 | Disposition: A | Discharge status: Home / Self Care | Payer: Medicare Other | Source: Ambulatory Visit | Attending: Pulmonary Disease | Admitting: Pulmonary Disease

## 2014-10-21 DIAGNOSIS — Z1231 Encounter for screening mammogram for malignant neoplasm of breast: Secondary | ICD-10-CM | POA: Insufficient documentation

## 2014-10-25 ENCOUNTER — Other Ambulatory Visit: Payer: Self-pay | Admitting: Pulmonary Disease

## 2014-10-25 DIAGNOSIS — R928 Other abnormal and inconclusive findings on diagnostic imaging of breast: Secondary | ICD-10-CM

## 2014-10-27 ENCOUNTER — Encounter (HOSPITAL_COMMUNITY): Payer: Self-pay

## 2014-11-01 ENCOUNTER — Encounter (HOSPITAL_COMMUNITY): Payer: Medicare Other

## 2014-11-08 ENCOUNTER — Encounter (HOSPITAL_COMMUNITY): Payer: Medicare Other

## 2014-11-21 DIAGNOSIS — E119 Type 2 diabetes mellitus without complications: Secondary | ICD-10-CM | POA: Diagnosis not present

## 2014-11-21 DIAGNOSIS — I110 Hypertensive heart disease with heart failure: Secondary | ICD-10-CM | POA: Diagnosis not present

## 2014-11-21 DIAGNOSIS — I482 Chronic atrial fibrillation: Secondary | ICD-10-CM | POA: Diagnosis not present

## 2014-11-21 DIAGNOSIS — I2510 Atherosclerotic heart disease of native coronary artery without angina pectoris: Secondary | ICD-10-CM | POA: Diagnosis not present

## 2014-11-22 ENCOUNTER — Ambulatory Visit (HOSPITAL_COMMUNITY)
Admission: RE | Admit: 2014-11-22 | Discharge: 2014-11-22 | Disposition: A | Discharge status: Home / Self Care | Payer: Medicare Other | Source: Ambulatory Visit | Attending: Pulmonary Disease | Admitting: Pulmonary Disease

## 2014-11-22 ENCOUNTER — Other Ambulatory Visit: Payer: Self-pay | Admitting: Pulmonary Disease

## 2014-11-22 DIAGNOSIS — R928 Other abnormal and inconclusive findings on diagnostic imaging of breast: Secondary | ICD-10-CM

## 2014-11-22 LAB — HM MAMMOGRAPHY

## 2014-12-13 ENCOUNTER — Ambulatory Visit: Payer: Self-pay | Admitting: Cardiovascular Disease

## 2014-12-13 DIAGNOSIS — I4892 Unspecified atrial flutter: Secondary | ICD-10-CM

## 2014-12-13 DIAGNOSIS — Z5181 Encounter for therapeutic drug level monitoring: Secondary | ICD-10-CM

## 2015-02-06 ENCOUNTER — Other Ambulatory Visit: Payer: Self-pay | Admitting: Interventional Cardiology

## 2015-02-20 DIAGNOSIS — I2510 Atherosclerotic heart disease of native coronary artery without angina pectoris: Secondary | ICD-10-CM | POA: Diagnosis not present

## 2015-02-20 DIAGNOSIS — I129 Hypertensive chronic kidney disease with stage 1 through stage 4 chronic kidney disease, or unspecified chronic kidney disease: Secondary | ICD-10-CM | POA: Diagnosis not present

## 2015-02-20 DIAGNOSIS — I110 Hypertensive heart disease with heart failure: Secondary | ICD-10-CM | POA: Diagnosis not present

## 2015-02-20 DIAGNOSIS — I482 Chronic atrial fibrillation: Secondary | ICD-10-CM | POA: Diagnosis not present

## 2015-02-27 DIAGNOSIS — I482 Chronic atrial fibrillation: Secondary | ICD-10-CM | POA: Diagnosis not present

## 2015-02-27 DIAGNOSIS — I2510 Atherosclerotic heart disease of native coronary artery without angina pectoris: Secondary | ICD-10-CM | POA: Diagnosis not present

## 2015-02-27 DIAGNOSIS — I110 Hypertensive heart disease with heart failure: Secondary | ICD-10-CM | POA: Diagnosis not present

## 2015-02-27 DIAGNOSIS — I129 Hypertensive chronic kidney disease with stage 1 through stage 4 chronic kidney disease, or unspecified chronic kidney disease: Secondary | ICD-10-CM | POA: Diagnosis not present

## 2015-03-06 ENCOUNTER — Other Ambulatory Visit: Payer: Self-pay | Admitting: Interventional Cardiology

## 2015-03-07 NOTE — Telephone Encounter (Signed)
Per note 4.1.15

## 2015-03-15 ENCOUNTER — Ambulatory Visit (INDEPENDENT_AMBULATORY_CARE_PROVIDER_SITE_OTHER): Payer: Medicare Other | Admitting: Ophthalmology

## 2015-03-15 DIAGNOSIS — H3531 Nonexudative age-related macular degeneration: Secondary | ICD-10-CM

## 2015-03-15 DIAGNOSIS — I10 Essential (primary) hypertension: Secondary | ICD-10-CM | POA: Diagnosis not present

## 2015-03-15 DIAGNOSIS — H2511 Age-related nuclear cataract, right eye: Secondary | ICD-10-CM

## 2015-03-15 DIAGNOSIS — E11319 Type 2 diabetes mellitus with unspecified diabetic retinopathy without macular edema: Secondary | ICD-10-CM

## 2015-03-15 DIAGNOSIS — H34211 Partial retinal artery occlusion, right eye: Secondary | ICD-10-CM

## 2015-03-15 DIAGNOSIS — H35033 Hypertensive retinopathy, bilateral: Secondary | ICD-10-CM

## 2015-03-15 DIAGNOSIS — H43813 Vitreous degeneration, bilateral: Secondary | ICD-10-CM

## 2015-03-15 DIAGNOSIS — E11339 Type 2 diabetes mellitus with moderate nonproliferative diabetic retinopathy without macular edema: Secondary | ICD-10-CM | POA: Diagnosis not present

## 2015-03-31 DIAGNOSIS — H3531 Nonexudative age-related macular degeneration: Secondary | ICD-10-CM | POA: Diagnosis not present

## 2015-03-31 DIAGNOSIS — H25012 Cortical age-related cataract, left eye: Secondary | ICD-10-CM | POA: Diagnosis not present

## 2015-03-31 DIAGNOSIS — E11319 Type 2 diabetes mellitus with unspecified diabetic retinopathy without macular edema: Secondary | ICD-10-CM | POA: Diagnosis not present

## 2015-03-31 DIAGNOSIS — H2512 Age-related nuclear cataract, left eye: Secondary | ICD-10-CM | POA: Diagnosis not present

## 2015-04-11 DIAGNOSIS — H2512 Age-related nuclear cataract, left eye: Secondary | ICD-10-CM | POA: Diagnosis not present

## 2015-04-20 DIAGNOSIS — E1121 Type 2 diabetes mellitus with diabetic nephropathy: Secondary | ICD-10-CM | POA: Diagnosis not present

## 2015-04-20 DIAGNOSIS — I119 Hypertensive heart disease without heart failure: Secondary | ICD-10-CM | POA: Diagnosis not present

## 2015-04-20 DIAGNOSIS — I482 Chronic atrial fibrillation: Secondary | ICD-10-CM | POA: Diagnosis not present

## 2015-04-20 DIAGNOSIS — I110 Hypertensive heart disease with heart failure: Secondary | ICD-10-CM | POA: Diagnosis not present

## 2015-05-15 DIAGNOSIS — H25011 Cortical age-related cataract, right eye: Secondary | ICD-10-CM | POA: Diagnosis not present

## 2015-05-15 DIAGNOSIS — H2511 Age-related nuclear cataract, right eye: Secondary | ICD-10-CM | POA: Diagnosis not present

## 2015-05-23 DIAGNOSIS — H2511 Age-related nuclear cataract, right eye: Secondary | ICD-10-CM | POA: Diagnosis not present

## 2015-06-06 DIAGNOSIS — I482 Chronic atrial fibrillation: Secondary | ICD-10-CM | POA: Diagnosis not present

## 2015-06-06 DIAGNOSIS — E1121 Type 2 diabetes mellitus with diabetic nephropathy: Secondary | ICD-10-CM | POA: Diagnosis not present

## 2015-06-06 DIAGNOSIS — I2510 Atherosclerotic heart disease of native coronary artery without angina pectoris: Secondary | ICD-10-CM | POA: Diagnosis not present

## 2015-06-06 DIAGNOSIS — I110 Hypertensive heart disease with heart failure: Secondary | ICD-10-CM | POA: Diagnosis not present

## 2015-06-20 DIAGNOSIS — I2510 Atherosclerotic heart disease of native coronary artery without angina pectoris: Secondary | ICD-10-CM | POA: Diagnosis not present

## 2015-06-20 DIAGNOSIS — E1121 Type 2 diabetes mellitus with diabetic nephropathy: Secondary | ICD-10-CM | POA: Diagnosis not present

## 2015-06-20 DIAGNOSIS — I10 Essential (primary) hypertension: Secondary | ICD-10-CM | POA: Diagnosis not present

## 2015-06-24 ENCOUNTER — Emergency Department (HOSPITAL_COMMUNITY): Payer: Medicare Other

## 2015-06-24 ENCOUNTER — Emergency Department (HOSPITAL_COMMUNITY)
Admission: EM | Admit: 2015-06-24 | Discharge: 2015-06-24 | Disposition: A | Discharge status: Home / Self Care | Payer: Medicare Other | Attending: Emergency Medicine | Admitting: Emergency Medicine

## 2015-06-24 ENCOUNTER — Encounter (HOSPITAL_COMMUNITY): Payer: Self-pay | Admitting: Emergency Medicine

## 2015-06-24 DIAGNOSIS — I5040 Unspecified combined systolic (congestive) and diastolic (congestive) heart failure: Secondary | ICD-10-CM | POA: Insufficient documentation

## 2015-06-24 DIAGNOSIS — R110 Nausea: Secondary | ICD-10-CM | POA: Diagnosis not present

## 2015-06-24 DIAGNOSIS — Z87891 Personal history of nicotine dependence: Secondary | ICD-10-CM | POA: Diagnosis not present

## 2015-06-24 DIAGNOSIS — Z7901 Long term (current) use of anticoagulants: Secondary | ICD-10-CM | POA: Insufficient documentation

## 2015-06-24 DIAGNOSIS — R42 Dizziness and giddiness: Secondary | ICD-10-CM | POA: Insufficient documentation

## 2015-06-24 DIAGNOSIS — Z86711 Personal history of pulmonary embolism: Secondary | ICD-10-CM | POA: Insufficient documentation

## 2015-06-24 DIAGNOSIS — I252 Old myocardial infarction: Secondary | ICD-10-CM | POA: Insufficient documentation

## 2015-06-24 DIAGNOSIS — Z7982 Long term (current) use of aspirin: Secondary | ICD-10-CM | POA: Diagnosis not present

## 2015-06-24 DIAGNOSIS — I2510 Atherosclerotic heart disease of native coronary artery without angina pectoris: Secondary | ICD-10-CM | POA: Diagnosis not present

## 2015-06-24 DIAGNOSIS — E119 Type 2 diabetes mellitus without complications: Secondary | ICD-10-CM | POA: Diagnosis not present

## 2015-06-24 DIAGNOSIS — Z79899 Other long term (current) drug therapy: Secondary | ICD-10-CM | POA: Insufficient documentation

## 2015-06-24 DIAGNOSIS — I10 Essential (primary) hypertension: Secondary | ICD-10-CM | POA: Insufficient documentation

## 2015-06-24 LAB — CBC WITH DIFFERENTIAL/PLATELET
Basophils Absolute: 0.1 10*3/uL (ref 0.0–0.1)
Basophils Relative: 2 % — ABNORMAL HIGH (ref 0–1)
Eosinophils Absolute: 0.1 10*3/uL (ref 0.0–0.7)
Eosinophils Relative: 2 % (ref 0–5)
HCT: 34.7 % — ABNORMAL LOW (ref 36.0–46.0)
Hemoglobin: 11.4 g/dL — ABNORMAL LOW (ref 12.0–15.0)
Lymphocytes Relative: 26 % (ref 12–46)
Lymphs Abs: 1.5 10*3/uL (ref 0.7–4.0)
MCH: 29.6 pg (ref 26.0–34.0)
MCHC: 32.9 g/dL (ref 30.0–36.0)
MCV: 90.1 fL (ref 78.0–100.0)
Monocytes Absolute: 0.4 10*3/uL (ref 0.1–1.0)
Monocytes Relative: 6 % (ref 3–12)
Neutro Abs: 3.6 10*3/uL (ref 1.7–7.7)
Neutrophils Relative %: 64 % (ref 43–77)
Platelets: 257 10*3/uL (ref 150–400)
RBC: 3.85 MIL/uL — ABNORMAL LOW (ref 3.87–5.11)
RDW: 13.5 % (ref 11.5–15.5)
WBC: 5.7 10*3/uL (ref 4.0–10.5)

## 2015-06-24 LAB — CBG MONITORING, ED: Glucose-Capillary: 136 mg/dL — ABNORMAL HIGH (ref 65–99)

## 2015-06-24 LAB — BASIC METABOLIC PANEL
Anion gap: 7 (ref 5–15)
BUN: 56 mg/dL — ABNORMAL HIGH (ref 6–20)
CO2: 23 mmol/L (ref 22–32)
Calcium: 8.6 mg/dL — ABNORMAL LOW (ref 8.9–10.3)
Chloride: 107 mmol/L (ref 101–111)
Creatinine, Ser: 1.33 mg/dL — ABNORMAL HIGH (ref 0.44–1.00)
GFR calc Af Amer: 44 mL/min — ABNORMAL LOW (ref >60)
GFR calc non Af Amer: 38 mL/min — ABNORMAL LOW (ref >60)
Glucose, Bld: 125 mg/dL — ABNORMAL HIGH (ref 65–99)
Potassium: 4.3 mmol/L (ref 3.5–5.1)
Sodium: 137 mmol/L (ref 135–145)

## 2015-06-24 LAB — PROTIME-INR
INR: 1.51 — ABNORMAL HIGH (ref 0.00–1.49)
Prothrombin Time: 18.2 seconds — ABNORMAL HIGH (ref 11.6–15.2)

## 2015-06-24 MED ORDER — ONDANSETRON 4 MG PO TBDP: 4.0000 mg | ORAL_TABLET | Freq: Three times a day (TID) | ORAL | Status: DC | PRN

## 2015-06-24 MED ORDER — MECLIZINE HCL 12.5 MG PO TABS
25.0000 mg | ORAL_TABLET | Freq: Once | ORAL | Status: AC
  Administered 2015-06-24: 25 mg via ORAL
  Filled 2015-06-24: qty 2

## 2015-06-24 MED ORDER — MECLIZINE HCL 25 MG PO TABS: 25.0000 mg | ORAL_TABLET | Freq: Three times a day (TID) | ORAL | Status: DC | PRN

## 2015-06-24 MED ORDER — ONDANSETRON 4 MG PO TBDP
4.0000 mg | ORAL_TABLET | Freq: Once | ORAL | Status: AC
  Administered 2015-06-24: 4 mg via ORAL
  Filled 2015-06-24: qty 1

## 2015-06-24 NOTE — ED Notes (Signed)
Pt was at [facility], room started spinning, nausea. Pt chewed some gum, sit in chair for a while, when felt better drove to ED. Pt now feels weak

## 2015-06-24 NOTE — ED Notes (Signed)
Patient is resting comfortably. 

## 2015-06-24 NOTE — ED Notes (Signed)
Pt sleeping soundly at this time.

## 2015-06-24 NOTE — ED Provider Notes (Signed)
CSN: [id number]     Arrival date & time 06/24/15  1409 History   First MD Initiated Contact with Patient 06/24/15 1425     Chief Complaint  Patient presents with  . Dizziness      HPI  Patient presents for evaluation of an episode of dizziness.  Patient states that she went to the salon to get her hair done this morning. She states that after she was done she walked across the street to go to [employer] store.  She started noticing that she walked across the street she was feeling dizzy. When she got into the store she felt like the "place was spinning around and got very nauseated". Denies headache. No chest pain or shortness of breath. No dyspnea or other symptoms. She states that she walked a short distance and sat in a chair. After 10:15 minutes had passed. The dizziness has resolved. She remains nauseated. No headache no other symptoms. Has never had a past similar episode.  No recent stroke or TIA symptoms. No fall or head injury. History of PE and takes Coumadin. Also a history of cardiomyopathy and previous MI and hypertension.  Past Medical History  Diagnosis Date  . Ischemic cardiomyopathy   . Myocardial infarction   . Diabetes mellitus without complication   . Hyperlipidemia   . Bilateral pulmonary embolism 08/2008    On coumadin  . Coronary artery disease     s/p DES to mid RCA in 02/2008; s/p inferior MI with DES to mid RCA and mid LAD in 05/2005  . Unspecified combined systolic and diastolic heart failure     Asymptomatic. Echo 04/28/13 EF= 40- 45%  . Hypertension     Controlled  . Atrial flutter     typical appearing  . Coronary atherosclerosis   . Unspecified combined systolic and diastolic heart failure    Past Surgical History  Procedure Laterality Date  . Cholecystectomy    . Hip surgery    . Atrial flutter ablation N/A 06/17/2013    Procedure: ATRIAL FLUTTER ABLATION;  Surgeon: Thompson Grayer, MD;  Location: [facility] CATH LAB;  Service: Cardiovascular;   Laterality: N/A;   Family History  Problem Relation Age of Onset  . Heart disease    . Arthritis    . Cancer    . Diabetes     Social History  Substance Use Topics  . Smoking status: Former Smoker -- 10 years    Types: Cigarettes    Quit date: 10/14/1978  . Smokeless tobacco: None  . Alcohol Use: No   OB History    No data available     Review of Systems  Gastrointestinal: Positive for nausea.  Neurological: Positive for dizziness.      Allergies  Review of patient's allergies indicates no known allergies.  Home Medications   Prior to Admission medications   Medication Sig Start Date End Date Taking? Authorizing Provider  amLODipine (NORVASC) 5 MG tablet Take 5 mg by mouth daily.   Yes Historical Provider, MD  aspirin EC 81 MG tablet Take 81 mg by mouth daily.   Yes Historical Provider, MD  Coenzyme Q10 (CO Q 10 PO) Take 1 tablet by mouth daily.   Yes Historical Provider, MD  enalapril (VASOTEC) 10 MG tablet Take 1 tablet (10 mg total) by mouth 2 (two) times daily. 05/27/14  Yes Belva Crome, MD  hydrochlorothiazide (HYDRODIURIL) 25 MG tablet TAKE ONE TABLET BY MOUTH ONCE DAILY **NEEDS AN APPOINTMENT** 03/07/15  Yes Mallie Mussel  Nicholes Stairs, MD  metFORMIN (GLUCOPHAGE) 500 MG tablet Take 1 tablet (500 mg total) by mouth 2 (two) times daily with a meal. 10/12/14  Yes Sinda Du, MD  metoprolol (LOPRESSOR) 50 MG tablet Take 50 mg by mouth 2 (two) times daily.   Yes Historical Provider, MD  Omega-3 Fatty Acids (OMEGA 3 PO) Take 1 capsule by mouth daily. [place] Historical Provider, MD  vitamin C (ASCORBIC ACID) 500 MG tablet Take 500 mg by mouth daily.   Yes Historical Provider, MD  vitamin E 400 UNIT capsule Take 400 Units by mouth daily.   Yes Historical Provider, MD  warfarin (COUMADIN) 5 MG tablet Take 5-10 mg by mouth daily at 6 PM. Take 5 mg on Monday, Tuesday, Wednesday, Friday, Saturday and Sunday.  Take 10 mg on Thursday.   Yes Historical Provider, MD   meclizine (ANTIVERT) 25 MG tablet Take 1 tablet (25 mg total) by mouth 3 (three) times daily as needed. 06/24/15   Tanna Furry, MD  nitroGLYCERIN (NITROSTAT) 0.4 MG SL tablet Place 0.4 mg under the tongue every 5 (five) minutes as needed for chest pain. 3 tablets maximum    Historical Provider, MD  ondansetron (ZOFRAN ODT) 4 MG disintegrating tablet Take 1 tablet (4 mg total) by mouth every 8 (eight) hours as needed for nausea. 06/24/15   Tanna Furry, MD   BP 116/45 mmHg  Pulse 65  Temp(Src) 98.1 F (36.7 C)  Resp 15  Ht 5' 5.5" (1.664 m)  Wt 160 lb (72.576 kg)  BMI 26.21 kg/m2  SpO2 95% Physical Exam  Constitutional: She is oriented to person, place, and time. She appears well-developed and well-nourished. No distress.  HENT:  Head: Normocephalic.  Eyes: Conjunctivae are normal. Pupils are equal, round, and reactive to light. No scleral icterus.  Neck: Normal range of motion. Neck supple. No thyromegaly present.  No carotid bruits.  Cardiovascular: Normal rate and regular rhythm.  Exam reveals no gallop and no friction rub.   No murmur heard. Pulmonary/Chest: Effort normal and breath sounds normal. No respiratory distress. She has no wheezes. She has no rales.  Abdominal: Soft. Bowel sounds are normal. She exhibits no distension. There is no tenderness. There is no rebound.  Musculoskeletal: Normal range of motion.  Neurological: She is alert and oriented to person, place, and time.  No reproducible nystagmus or reproducible symptoms with position change or head or eye movement.  Skin: Skin is warm and dry. No rash noted.  Psychiatric: She has a normal mood and affect. Her behavior is normal.    ED Course  Procedures (including critical care time) Labs Review Labs Reviewed  CBC WITH DIFFERENTIAL/PLATELET - Abnormal; Notable for the following:    RBC 3.85 (*)    Hemoglobin 11.4 (*)    HCT 34.7 (*)    Basophils Relative 2 (*)    All other components within normal limits  BASIC  METABOLIC PANEL - Abnormal; Notable for the following:    Glucose, Bld 125 (*)    BUN 56 (*)    Creatinine, Ser 1.33 (*)    Calcium 8.6 (*)    GFR calc non Af Amer 38 (*)    GFR calc Af Amer 44 (*)    All other components within normal limits  PROTIME-INR - Abnormal; Notable for the following:    Prothrombin Time 18.2 (*)    INR 1.51 (*)    All other components within normal limits  CBG MONITORING, ED -  Abnormal; Notable for the following:    Glucose-Capillary 136 (*)    All other components within normal limits    Imaging Review Ct Head Wo Contrast  06/24/2015   CLINICAL DATA:  Dizziness.  EXAM: CT HEAD WITHOUT CONTRAST  TECHNIQUE: Contiguous axial images were obtained from the base of the skull through the vertex without intravenous contrast.  COMPARISON:  CT scan of October 10, 2014.  FINDINGS: Bony calvarium appears intact. No mass effect or midline shift is noted. Ventricular size is within normal limits. There is no evidence of mass lesion, hemorrhage or acute infarction.  IMPRESSION: Normal head CT.   Electronically Signed   By: Marijo Conception, M.D.   On: 06/24/2015 16:10   I have personally reviewed and evaluated these images and lab results as part of my medical decision-making.   EKG Interpretation   Date/Time:  Saturday June 24 2015 15:14:32 EDT Ventricular Rate:  73 PR Interval:  162 QRS Duration: 112 QT Interval:  431 QTC Calculation: 475 R Axis:   70 Text Interpretation:  Sinus rhythm Low voltage, extremity and precordial  leads Old anterior infarct. Confirmed by Jeneen Rinks  MD, [place] ([id number]) on  06/24/2015 3:36:49 PM      MDM   Final diagnoses:  Vertigo    Symptoms sound like acute peripheral vertigo. May be related to the position change of her salon visit. Doubt CNS hemorrhage as she has no headache. CT pending. Doubt stroke is patient is anticoagulated his symptoms were brief and transient.  CT shows no acute findings. INR 1.5. Resolution of her  nausea. No recurrence of the dizziness. She is appropriate for outpatient treatment. Cautioned against driving until 24 hours without symptoms. Prescription for Zofran when necessary. Meclizine 24 hours without symptoms.    Tanna Furry, MD 06/24/15 [phone]

## 2015-06-24 NOTE — Discharge Instructions (Signed)
Take meclizine until 24 hours without symptoms.  No driving until 24 hours without symptoms.   Benign Positional Vertigo Vertigo means you feel like you or your surroundings are moving when they are not. Benign positional vertigo is the most common form of vertigo. Benign means that the cause of your condition is not serious. Benign positional vertigo is more common in older adults. CAUSES  Benign positional vertigo is the result of an upset in the labyrinth system. This is an area in the middle ear that helps control your balance. This may be caused by a viral infection, head injury, or repetitive motion. However, often no specific cause is found. SYMPTOMS  Symptoms of benign positional vertigo occur when you move your head or eyes in different directions. Some of the symptoms may include:  Loss of balance and falls.  Vomiting.  Blurred vision.  Dizziness.  Nausea.  Involuntary eye movements (nystagmus). DIAGNOSIS  Benign positional vertigo is usually diagnosed by physical exam. If the specific cause of your benign positional vertigo is unknown, your caregiver may perform imaging tests, such as magnetic resonance imaging (MRI) or computed tomography (CT). TREATMENT  Your caregiver may recommend movements or procedures to correct the benign positional vertigo. Medicines such as meclizine, benzodiazepines, and medicines for nausea may be used to treat your symptoms. In rare cases, if your symptoms are caused by certain conditions that affect the inner ear, you may need surgery. HOME CARE INSTRUCTIONS   Follow your caregiver's instructions.  Move slowly. Do not make sudden body or head movements.  Avoid driving.  Avoid operating heavy machinery.  Avoid performing any tasks that would be dangerous to you or others during a vertigo episode.  Drink enough fluids to keep your urine clear or pale yellow. SEEK IMMEDIATE MEDICAL CARE IF:   You develop problems with walking, weakness,  numbness, or using your arms, hands, or legs.  You have difficulty speaking.  You develop severe headaches.  Your nausea or vomiting continues or gets worse.  You develop visual changes.  Your family or friends notice any behavioral changes.  Your condition gets worse.  You have a fever.  You develop a stiff neck or sensitivity to light. MAKE SURE YOU:   Understand these instructions.  Will watch your condition.  Will get help right away if you are not doing well or get worse. Document Released: 07/08/2006 Document Revised: 12/23/2011 Document Reviewed: 06/20/2011 [facility] Patient Information 2015 [place], [place]. This information is not intended to replace advice given to you by your health care provider. Make sure you discuss any questions you have with your health care provider.

## 2015-06-27 DIAGNOSIS — I129 Hypertensive chronic kidney disease with stage 1 through stage 4 chronic kidney disease, or unspecified chronic kidney disease: Secondary | ICD-10-CM | POA: Diagnosis not present

## 2015-06-27 DIAGNOSIS — E1121 Type 2 diabetes mellitus with diabetic nephropathy: Secondary | ICD-10-CM | POA: Diagnosis not present

## 2015-06-27 DIAGNOSIS — I110 Hypertensive heart disease with heart failure: Secondary | ICD-10-CM | POA: Diagnosis not present

## 2015-07-24 DIAGNOSIS — Z23 Encounter for immunization: Secondary | ICD-10-CM | POA: Diagnosis not present

## 2015-07-24 DIAGNOSIS — E162 Hypoglycemia, unspecified: Secondary | ICD-10-CM | POA: Diagnosis not present

## 2015-07-24 DIAGNOSIS — E1121 Type 2 diabetes mellitus with diabetic nephropathy: Secondary | ICD-10-CM | POA: Diagnosis not present

## 2015-07-24 DIAGNOSIS — I482 Chronic atrial fibrillation: Secondary | ICD-10-CM | POA: Diagnosis not present

## 2015-07-24 DIAGNOSIS — I2510 Atherosclerotic heart disease of native coronary artery without angina pectoris: Secondary | ICD-10-CM | POA: Diagnosis not present

## 2015-09-25 DIAGNOSIS — I110 Hypertensive heart disease with heart failure: Secondary | ICD-10-CM | POA: Diagnosis not present

## 2015-09-25 DIAGNOSIS — E785 Hyperlipidemia, unspecified: Secondary | ICD-10-CM | POA: Diagnosis not present

## 2015-09-25 DIAGNOSIS — I482 Chronic atrial fibrillation: Secondary | ICD-10-CM | POA: Diagnosis not present

## 2015-09-25 DIAGNOSIS — E1141 Type 2 diabetes mellitus with diabetic mononeuropathy: Secondary | ICD-10-CM | POA: Diagnosis not present

## 2015-12-19 DIAGNOSIS — I482 Chronic atrial fibrillation: Secondary | ICD-10-CM | POA: Diagnosis not present

## 2015-12-19 DIAGNOSIS — E785 Hyperlipidemia, unspecified: Secondary | ICD-10-CM | POA: Diagnosis not present

## 2015-12-19 DIAGNOSIS — E1121 Type 2 diabetes mellitus with diabetic nephropathy: Secondary | ICD-10-CM | POA: Diagnosis not present

## 2015-12-19 DIAGNOSIS — I110 Hypertensive heart disease with heart failure: Secondary | ICD-10-CM | POA: Diagnosis not present

## 2015-12-27 DIAGNOSIS — E1121 Type 2 diabetes mellitus with diabetic nephropathy: Secondary | ICD-10-CM | POA: Diagnosis not present

## 2015-12-27 DIAGNOSIS — I2510 Atherosclerotic heart disease of native coronary artery without angina pectoris: Secondary | ICD-10-CM | POA: Diagnosis not present

## 2015-12-27 DIAGNOSIS — I482 Chronic atrial fibrillation: Secondary | ICD-10-CM | POA: Diagnosis not present

## 2015-12-27 DIAGNOSIS — I110 Hypertensive heart disease with heart failure: Secondary | ICD-10-CM | POA: Diagnosis not present

## 2016-03-19 ENCOUNTER — Ambulatory Visit (INDEPENDENT_AMBULATORY_CARE_PROVIDER_SITE_OTHER): Payer: Medicare Other | Admitting: Ophthalmology

## 2016-03-19 DIAGNOSIS — H35033 Hypertensive retinopathy, bilateral: Secondary | ICD-10-CM

## 2016-03-19 DIAGNOSIS — I10 Essential (primary) hypertension: Secondary | ICD-10-CM

## 2016-03-19 DIAGNOSIS — E113393 Type 2 diabetes mellitus with moderate nonproliferative diabetic retinopathy without macular edema, bilateral: Secondary | ICD-10-CM

## 2016-03-19 DIAGNOSIS — E10319 Type 1 diabetes mellitus with unspecified diabetic retinopathy without macular edema: Secondary | ICD-10-CM | POA: Diagnosis not present

## 2016-03-19 DIAGNOSIS — H43813 Vitreous degeneration, bilateral: Secondary | ICD-10-CM | POA: Diagnosis not present

## 2016-04-01 DIAGNOSIS — I2510 Atherosclerotic heart disease of native coronary artery without angina pectoris: Secondary | ICD-10-CM | POA: Diagnosis not present

## 2016-04-01 DIAGNOSIS — I482 Chronic atrial fibrillation: Secondary | ICD-10-CM | POA: Diagnosis not present

## 2016-04-01 DIAGNOSIS — E1121 Type 2 diabetes mellitus with diabetic nephropathy: Secondary | ICD-10-CM | POA: Diagnosis not present

## 2016-04-01 DIAGNOSIS — I110 Hypertensive heart disease with heart failure: Secondary | ICD-10-CM | POA: Diagnosis not present

## 2016-04-22 ENCOUNTER — Encounter: Payer: Self-pay | Admitting: Internal Medicine

## 2016-04-22 ENCOUNTER — Ambulatory Visit (INDEPENDENT_AMBULATORY_CARE_PROVIDER_SITE_OTHER): Payer: Medicare Other

## 2016-04-22 ENCOUNTER — Ambulatory Visit (INDEPENDENT_AMBULATORY_CARE_PROVIDER_SITE_OTHER): Payer: Medicare Other | Admitting: Internal Medicine

## 2016-04-22 VITALS — BP 136/60 | HR 81 | Ht 65.0 in | Wt 169.0 lb

## 2016-04-22 DIAGNOSIS — Z7901 Long term (current) use of anticoagulants: Secondary | ICD-10-CM | POA: Diagnosis not present

## 2016-04-22 DIAGNOSIS — I4892 Unspecified atrial flutter: Secondary | ICD-10-CM | POA: Diagnosis not present

## 2016-04-22 DIAGNOSIS — I10 Essential (primary) hypertension: Secondary | ICD-10-CM

## 2016-04-22 LAB — POCT INR: INR: 1.4

## 2016-04-22 NOTE — Patient Instructions (Addendum)
Your physician wants you to follow-up in: 1 year with Dr Theressa Stamps will receive a reminder letter in the mail two months in advance. If you don't receive a letter, please call our office to schedule the follow-up appointment.   Make apt with Edrick Oh RN coumadin nurse ASAP   Your physician recommends that you continue on your current medications as directed. Please refer to the Current Medication list given to you today.     Thank you for choosing [facility] !

## 2016-04-22 NOTE — Progress Notes (Signed)
Cardiology Office Note   Date:  04/22/2016   ID:  [name], DOB 1940/11/13, MRN [id number]  PCP:  Alonza Bogus, MD  Cardiologist:   Dorris Carnes, MD   No chief complaint on file. Pt presents for following of anticoagulation      History of Present Illness: [name] is a 75 y.o. female with a history of CAD (stent in past), intermittent aflutter, HTN    Previously followed by Lyndel Safe  Last seen in 2015 Stent in 2009  Breahing is OK  No CP  No dizzines  No palpitaions         Outpatient Prescriptions Prior to Visit  Medication Sig Dispense Refill  . amLODipine (NORVASC) 5 MG tablet Take 5 mg by mouth daily.    Marland Kitchen aspirin EC 81 MG tablet Take 81 mg by mouth daily.    . Coenzyme Q10 (CO Q 10 PO) Take 1 tablet by mouth daily.    . enalapril (VASOTEC) 10 MG tablet Take 1 tablet (10 mg total) by mouth 2 (two) times daily. 180 tablet 0  . hydrochlorothiazide (HYDRODIURIL) 25 MG tablet TAKE ONE TABLET BY MOUTH ONCE DAILY **NEEDS AN APPOINTMENT** 15 tablet 0  . metFORMIN (GLUCOPHAGE) 500 MG tablet Take 1 tablet (500 mg total) by mouth 2 (two) times daily with a meal. 60 tablet 5  . metoprolol (LOPRESSOR) 50 MG tablet Take 50 mg by mouth 2 (two) times daily.    . nitroGLYCERIN (NITROSTAT) 0.4 MG SL tablet Place 0.4 mg under the tongue every 5 (five) minutes as needed for chest pain. 3 tablets maximum    . Omega-3 Fatty Acids (OMEGA 3 PO) Take 1 capsule by mouth daily. Omega [place]    . vitamin C (ASCORBIC ACID) 500 MG tablet Take 500 mg by mouth daily.    . vitamin E 400 UNIT capsule Take 400 Units by mouth daily.    Marland Kitchen warfarin (COUMADIN) 5 MG tablet Take 5-10 mg by mouth daily at 6 PM. Take 5 mg on Monday, Tuesday, Wednesday, Friday, Saturday and Sunday.  Take 10 mg on Thursday.    . meclizine (ANTIVERT) 25 MG tablet Take 1 tablet (25 mg total) by mouth 3 (three) times daily as needed. 20 tablet 0  . ondansetron (ZOFRAN ODT) 4 MG disintegrating tablet Take 1  tablet (4 mg total) by mouth every 8 (eight) hours as needed for nausea. 20 tablet 0   No facility-administered medications prior to visit.     Allergies:   Review of patient's allergies indicates no known allergies.   Past Medical History  Diagnosis Date  . Ischemic cardiomyopathy   . Myocardial infarction ([place])   . Diabetes mellitus without complication ([place])   . Hyperlipidemia   . Bilateral pulmonary embolism ([place]) 08/2008    On coumadin  . Coronary artery disease     s/p DES to mid RCA in 02/2008; s/p inferior MI with DES to mid RCA and mid LAD in 05/2005  . Unspecified combined systolic and diastolic heart failure     Asymptomatic. Echo 04/28/13 EF= 40- 45%  . Hypertension     Controlled  . Atrial flutter ([place])     typical appearing  . Coronary atherosclerosis   . Unspecified combined systolic and diastolic heart failure     Past Surgical History  Procedure Laterality Date  . Cholecystectomy    . Hip surgery    . Atrial flutter ablation N/A 06/17/2013    Procedure: ATRIAL  FLUTTER ABLATION;  Surgeon: Thompson Grayer, MD;  Location: [facility] CATH LAB;  Service: Cardiovascular;  Laterality: N/A;     Social History:  The patient  reports that she quit smoking about 37 years ago. Her smoking use included Cigarettes. She quit after 10 years of use. She does not have any smokeless tobacco history on file. She reports that she does not drink alcohol or use illicit drugs.   Family History:  The patient's family history is not on file.    ROS:  Please see the history of present illness. All other systems are reviewed and  Negative to the above problem except as noted.    PHYSICAL EXAM: VS:  BP 136/60 mmHg  Pulse 81  Ht 5\' 5"  (1.651 m)  Wt 169 lb (76.658 kg)  BMI 28.12 kg/m2  SpO2 97%  GEN: Well nourished, well developed, in no acute distress HEENT: normal Neck: no JVD, carotid bruits, or masses Cardiac: RRR; no murmurs, rubs, or gallops,no edema  Respiratory:  clear to  auscultation bilaterally, normal work of breathing GI: soft, nontender, nondistended, + BS  No hepatomegaly  MS: no deformity Moving all extremities   Skin: warm and dry, no rash Neuro:  Strength and sensation are intact Psych: euthymic mood, full affect   EKG:  EKG is not ordered today.  September 2016 SR 73 bpm     Lipid Panel    Component Value Date/Time   CHOL 134 10/11/2014 0651   TRIG 61 10/11/2014 0651   HDL 48 10/11/2014 0651   CHOLHDL 2.8 10/11/2014 0651   VLDL 12 10/11/2014 0651   LDLCALC 74 10/11/2014 0651      Wt Readings from Last 3 Encounters:  04/22/16 169 lb (76.658 kg)  06/24/15 160 lb (72.576 kg)  10/11/14 165 lb (74.844 kg)      ASSESSMENT AND PLAN:   1  Paroxysmal atrial flutter  Pt in SR today  Will get her establlished for INR checkes  2  CAD  I am not convinced of active angina  Echo in 2015 LVEF had normalized  Encouraged her to stay active  3..lipids  Good control  LDL 76  4  HTN  Adeqquate control  Keep on meds    F/U with MD in 1 year     Signed, Dorris Carnes, MD  04/22/2016 12:06 PM    [name], [place], [place]  [address] Phone: [phone]; Fax: [phone]

## 2016-04-29 ENCOUNTER — Ambulatory Visit (INDEPENDENT_AMBULATORY_CARE_PROVIDER_SITE_OTHER): Payer: Medicare Other | Admitting: *Deleted

## 2016-04-29 DIAGNOSIS — Z7901 Long term (current) use of anticoagulants: Secondary | ICD-10-CM

## 2016-04-29 DIAGNOSIS — I4892 Unspecified atrial flutter: Secondary | ICD-10-CM | POA: Diagnosis not present

## 2016-04-29 LAB — POCT INR: INR: 2.8

## 2016-05-06 ENCOUNTER — Encounter (INDEPENDENT_AMBULATORY_CARE_PROVIDER_SITE_OTHER): Payer: Self-pay

## 2016-05-06 ENCOUNTER — Ambulatory Visit (INDEPENDENT_AMBULATORY_CARE_PROVIDER_SITE_OTHER): Payer: Medicare Other | Admitting: *Deleted

## 2016-05-06 DIAGNOSIS — I4892 Unspecified atrial flutter: Secondary | ICD-10-CM

## 2016-05-06 DIAGNOSIS — Z7901 Long term (current) use of anticoagulants: Secondary | ICD-10-CM

## 2016-05-06 LAB — POCT INR: INR: 3.6

## 2016-05-20 ENCOUNTER — Ambulatory Visit (INDEPENDENT_AMBULATORY_CARE_PROVIDER_SITE_OTHER): Payer: Medicare Other | Admitting: *Deleted

## 2016-05-20 DIAGNOSIS — I4892 Unspecified atrial flutter: Secondary | ICD-10-CM | POA: Diagnosis not present

## 2016-05-20 DIAGNOSIS — Z7901 Long term (current) use of anticoagulants: Secondary | ICD-10-CM | POA: Diagnosis not present

## 2016-05-20 LAB — POCT INR: INR: 3.8

## 2016-06-03 ENCOUNTER — Encounter: Payer: Self-pay | Admitting: *Deleted

## 2016-06-12 ENCOUNTER — Ambulatory Visit (INDEPENDENT_AMBULATORY_CARE_PROVIDER_SITE_OTHER): Payer: Medicare Other | Admitting: *Deleted

## 2016-06-12 DIAGNOSIS — Z7901 Long term (current) use of anticoagulants: Secondary | ICD-10-CM

## 2016-06-12 DIAGNOSIS — I4892 Unspecified atrial flutter: Secondary | ICD-10-CM | POA: Diagnosis not present

## 2016-06-12 LAB — POCT INR: INR: 2.1

## 2016-07-03 DIAGNOSIS — I2510 Atherosclerotic heart disease of native coronary artery without angina pectoris: Secondary | ICD-10-CM | POA: Diagnosis not present

## 2016-07-03 DIAGNOSIS — I129 Hypertensive chronic kidney disease with stage 1 through stage 4 chronic kidney disease, or unspecified chronic kidney disease: Secondary | ICD-10-CM | POA: Diagnosis not present

## 2016-07-03 DIAGNOSIS — E1121 Type 2 diabetes mellitus with diabetic nephropathy: Secondary | ICD-10-CM | POA: Diagnosis not present

## 2016-07-03 DIAGNOSIS — I482 Chronic atrial fibrillation: Secondary | ICD-10-CM | POA: Diagnosis not present

## 2016-07-03 DIAGNOSIS — Z23 Encounter for immunization: Secondary | ICD-10-CM | POA: Diagnosis not present

## 2016-07-08 ENCOUNTER — Ambulatory Visit (INDEPENDENT_AMBULATORY_CARE_PROVIDER_SITE_OTHER): Payer: Medicare Other | Admitting: *Deleted

## 2016-07-08 DIAGNOSIS — E1121 Type 2 diabetes mellitus with diabetic nephropathy: Secondary | ICD-10-CM | POA: Diagnosis not present

## 2016-07-08 DIAGNOSIS — I4892 Unspecified atrial flutter: Secondary | ICD-10-CM

## 2016-07-08 DIAGNOSIS — Z7901 Long term (current) use of anticoagulants: Secondary | ICD-10-CM

## 2016-07-08 DIAGNOSIS — I2510 Atherosclerotic heart disease of native coronary artery without angina pectoris: Secondary | ICD-10-CM | POA: Diagnosis not present

## 2016-07-08 DIAGNOSIS — I129 Hypertensive chronic kidney disease with stage 1 through stage 4 chronic kidney disease, or unspecified chronic kidney disease: Secondary | ICD-10-CM | POA: Diagnosis not present

## 2016-07-08 DIAGNOSIS — I482 Chronic atrial fibrillation: Secondary | ICD-10-CM | POA: Diagnosis not present

## 2016-07-08 LAB — POCT INR: INR: 1.1

## 2016-07-22 ENCOUNTER — Ambulatory Visit (INDEPENDENT_AMBULATORY_CARE_PROVIDER_SITE_OTHER): Payer: Medicare Other | Admitting: *Deleted

## 2016-07-22 DIAGNOSIS — I4892 Unspecified atrial flutter: Secondary | ICD-10-CM

## 2016-07-22 DIAGNOSIS — Z7901 Long term (current) use of anticoagulants: Secondary | ICD-10-CM

## 2016-07-22 LAB — POCT INR: INR: 1.1

## 2016-07-29 ENCOUNTER — Ambulatory Visit (INDEPENDENT_AMBULATORY_CARE_PROVIDER_SITE_OTHER): Payer: Medicare Other | Admitting: *Deleted

## 2016-07-29 DIAGNOSIS — Z7901 Long term (current) use of anticoagulants: Secondary | ICD-10-CM

## 2016-07-29 DIAGNOSIS — I4892 Unspecified atrial flutter: Secondary | ICD-10-CM | POA: Diagnosis not present

## 2016-07-29 LAB — POCT INR: INR: 2.5

## 2016-08-07 ENCOUNTER — Ambulatory Visit (INDEPENDENT_AMBULATORY_CARE_PROVIDER_SITE_OTHER): Payer: Medicare Other | Admitting: *Deleted

## 2016-08-07 DIAGNOSIS — I4892 Unspecified atrial flutter: Secondary | ICD-10-CM | POA: Diagnosis not present

## 2016-08-07 DIAGNOSIS — Z7901 Long term (current) use of anticoagulants: Secondary | ICD-10-CM

## 2016-08-07 LAB — POCT INR: INR: 3.1

## 2016-08-21 ENCOUNTER — Ambulatory Visit (INDEPENDENT_AMBULATORY_CARE_PROVIDER_SITE_OTHER): Payer: Medicare Other | Admitting: *Deleted

## 2016-08-21 DIAGNOSIS — I4892 Unspecified atrial flutter: Secondary | ICD-10-CM

## 2016-08-21 DIAGNOSIS — Z7901 Long term (current) use of anticoagulants: Secondary | ICD-10-CM | POA: Diagnosis not present

## 2016-08-21 LAB — POCT INR: INR: 2.7

## 2016-09-09 ENCOUNTER — Ambulatory Visit (INDEPENDENT_AMBULATORY_CARE_PROVIDER_SITE_OTHER): Payer: Medicare Other | Admitting: *Deleted

## 2016-09-09 DIAGNOSIS — Z7901 Long term (current) use of anticoagulants: Secondary | ICD-10-CM

## 2016-09-09 DIAGNOSIS — I4892 Unspecified atrial flutter: Secondary | ICD-10-CM | POA: Diagnosis not present

## 2016-09-09 LAB — POCT INR: INR: 2.3

## 2016-10-02 ENCOUNTER — Ambulatory Visit (INDEPENDENT_AMBULATORY_CARE_PROVIDER_SITE_OTHER): Payer: Medicare Other | Admitting: *Deleted

## 2016-10-02 DIAGNOSIS — Z7901 Long term (current) use of anticoagulants: Secondary | ICD-10-CM

## 2016-10-02 DIAGNOSIS — I4892 Unspecified atrial flutter: Secondary | ICD-10-CM

## 2016-10-02 LAB — POCT INR: INR: 2.6

## 2016-10-10 DIAGNOSIS — I2510 Atherosclerotic heart disease of native coronary artery without angina pectoris: Secondary | ICD-10-CM | POA: Diagnosis not present

## 2016-10-10 DIAGNOSIS — I110 Hypertensive heart disease with heart failure: Secondary | ICD-10-CM | POA: Diagnosis not present

## 2016-10-10 DIAGNOSIS — E1121 Type 2 diabetes mellitus with diabetic nephropathy: Secondary | ICD-10-CM | POA: Diagnosis not present

## 2016-10-10 DIAGNOSIS — I482 Chronic atrial fibrillation: Secondary | ICD-10-CM | POA: Diagnosis not present

## 2016-11-01 ENCOUNTER — Ambulatory Visit (INDEPENDENT_AMBULATORY_CARE_PROVIDER_SITE_OTHER): Payer: Medicare Other | Admitting: *Deleted

## 2016-11-01 DIAGNOSIS — Z7901 Long term (current) use of anticoagulants: Secondary | ICD-10-CM

## 2016-11-01 DIAGNOSIS — I4892 Unspecified atrial flutter: Secondary | ICD-10-CM

## 2016-11-01 LAB — POCT INR: INR: 2.7

## 2016-12-11 ENCOUNTER — Ambulatory Visit (INDEPENDENT_AMBULATORY_CARE_PROVIDER_SITE_OTHER): Payer: Medicare Other | Admitting: *Deleted

## 2016-12-11 DIAGNOSIS — I4892 Unspecified atrial flutter: Secondary | ICD-10-CM

## 2016-12-11 DIAGNOSIS — Z7901 Long term (current) use of anticoagulants: Secondary | ICD-10-CM

## 2016-12-11 LAB — POCT INR: INR: 3

## 2017-01-22 ENCOUNTER — Ambulatory Visit (INDEPENDENT_AMBULATORY_CARE_PROVIDER_SITE_OTHER): Payer: Medicare Other | Admitting: *Deleted

## 2017-01-22 DIAGNOSIS — I4892 Unspecified atrial flutter: Secondary | ICD-10-CM | POA: Diagnosis not present

## 2017-01-22 DIAGNOSIS — Z7901 Long term (current) use of anticoagulants: Secondary | ICD-10-CM | POA: Diagnosis not present

## 2017-01-22 LAB — POCT INR: INR: 1.1

## 2017-01-29 ENCOUNTER — Ambulatory Visit (INDEPENDENT_AMBULATORY_CARE_PROVIDER_SITE_OTHER): Payer: Medicare Other | Admitting: *Deleted

## 2017-01-29 DIAGNOSIS — I4892 Unspecified atrial flutter: Secondary | ICD-10-CM

## 2017-01-29 DIAGNOSIS — Z7901 Long term (current) use of anticoagulants: Secondary | ICD-10-CM

## 2017-01-29 LAB — POCT INR: INR: 2.3

## 2017-02-12 ENCOUNTER — Encounter: Payer: Self-pay | Admitting: *Deleted

## 2017-02-17 ENCOUNTER — Ambulatory Visit (INDEPENDENT_AMBULATORY_CARE_PROVIDER_SITE_OTHER): Payer: Medicare Other | Admitting: *Deleted

## 2017-02-17 DIAGNOSIS — Z7901 Long term (current) use of anticoagulants: Secondary | ICD-10-CM | POA: Diagnosis not present

## 2017-02-17 DIAGNOSIS — I4892 Unspecified atrial flutter: Secondary | ICD-10-CM | POA: Diagnosis not present

## 2017-02-17 LAB — POCT INR: INR: 2.5

## 2017-03-12 ENCOUNTER — Ambulatory Visit (INDEPENDENT_AMBULATORY_CARE_PROVIDER_SITE_OTHER): Payer: Medicare Other | Admitting: *Deleted

## 2017-03-12 DIAGNOSIS — Z7901 Long term (current) use of anticoagulants: Secondary | ICD-10-CM

## 2017-03-12 DIAGNOSIS — I4892 Unspecified atrial flutter: Secondary | ICD-10-CM | POA: Diagnosis not present

## 2017-03-12 LAB — POCT INR: INR: 1.9

## 2017-03-14 DIAGNOSIS — I110 Hypertensive heart disease with heart failure: Secondary | ICD-10-CM | POA: Diagnosis not present

## 2017-03-14 DIAGNOSIS — I129 Hypertensive chronic kidney disease with stage 1 through stage 4 chronic kidney disease, or unspecified chronic kidney disease: Secondary | ICD-10-CM | POA: Diagnosis not present

## 2017-03-14 DIAGNOSIS — I2510 Atherosclerotic heart disease of native coronary artery without angina pectoris: Secondary | ICD-10-CM | POA: Diagnosis not present

## 2017-03-14 DIAGNOSIS — E1121 Type 2 diabetes mellitus with diabetic nephropathy: Secondary | ICD-10-CM | POA: Diagnosis not present

## 2017-03-18 DIAGNOSIS — M546 Pain in thoracic spine: Secondary | ICD-10-CM | POA: Diagnosis not present

## 2017-03-18 DIAGNOSIS — M9902 Segmental and somatic dysfunction of thoracic region: Secondary | ICD-10-CM | POA: Diagnosis not present

## 2017-03-19 DIAGNOSIS — M9902 Segmental and somatic dysfunction of thoracic region: Secondary | ICD-10-CM | POA: Diagnosis not present

## 2017-03-19 DIAGNOSIS — M546 Pain in thoracic spine: Secondary | ICD-10-CM | POA: Diagnosis not present

## 2017-03-20 DIAGNOSIS — M546 Pain in thoracic spine: Secondary | ICD-10-CM | POA: Diagnosis not present

## 2017-03-20 DIAGNOSIS — M9902 Segmental and somatic dysfunction of thoracic region: Secondary | ICD-10-CM | POA: Diagnosis not present

## 2017-03-24 ENCOUNTER — Ambulatory Visit (INDEPENDENT_AMBULATORY_CARE_PROVIDER_SITE_OTHER): Payer: Medicare Other | Admitting: Ophthalmology

## 2017-03-24 DIAGNOSIS — M546 Pain in thoracic spine: Secondary | ICD-10-CM | POA: Diagnosis not present

## 2017-03-24 DIAGNOSIS — M9902 Segmental and somatic dysfunction of thoracic region: Secondary | ICD-10-CM | POA: Diagnosis not present

## 2017-03-25 ENCOUNTER — Observation Stay (HOSPITAL_BASED_OUTPATIENT_CLINIC_OR_DEPARTMENT_OTHER): Payer: Medicare Other

## 2017-03-25 ENCOUNTER — Emergency Department (HOSPITAL_COMMUNITY): Payer: Medicare Other

## 2017-03-25 ENCOUNTER — Observation Stay (HOSPITAL_COMMUNITY)
Admission: EM | Admit: 2017-03-25 | Discharge: 2017-03-27 | Disposition: A | Discharge status: Home / Self Care | Payer: Medicare Other | Attending: Pulmonary Disease | Admitting: Pulmonary Disease

## 2017-03-25 ENCOUNTER — Encounter (HOSPITAL_COMMUNITY): Payer: Self-pay | Admitting: Emergency Medicine

## 2017-03-25 ENCOUNTER — Observation Stay (HOSPITAL_COMMUNITY): Payer: Medicare Other

## 2017-03-25 DIAGNOSIS — N183 Chronic kidney disease, stage 3 unspecified: Secondary | ICD-10-CM

## 2017-03-25 DIAGNOSIS — R29810 Facial weakness: Secondary | ICD-10-CM | POA: Diagnosis present

## 2017-03-25 DIAGNOSIS — I110 Hypertensive heart disease with heart failure: Secondary | ICD-10-CM | POA: Insufficient documentation

## 2017-03-25 DIAGNOSIS — R200 Anesthesia of skin: Secondary | ICD-10-CM | POA: Diagnosis not present

## 2017-03-25 DIAGNOSIS — R29898 Other symptoms and signs involving the musculoskeletal system: Secondary | ICD-10-CM | POA: Diagnosis not present

## 2017-03-25 DIAGNOSIS — I4892 Unspecified atrial flutter: Secondary | ICD-10-CM | POA: Diagnosis not present

## 2017-03-25 DIAGNOSIS — Z87891 Personal history of nicotine dependence: Secondary | ICD-10-CM | POA: Diagnosis not present

## 2017-03-25 DIAGNOSIS — G459 Transient cerebral ischemic attack, unspecified: Secondary | ICD-10-CM | POA: Diagnosis present

## 2017-03-25 DIAGNOSIS — I5030 Unspecified diastolic (congestive) heart failure: Secondary | ICD-10-CM | POA: Diagnosis not present

## 2017-03-25 DIAGNOSIS — I6523 Occlusion and stenosis of bilateral carotid arteries: Secondary | ICD-10-CM | POA: Diagnosis not present

## 2017-03-25 DIAGNOSIS — E1122 Type 2 diabetes mellitus with diabetic chronic kidney disease: Secondary | ICD-10-CM | POA: Diagnosis not present

## 2017-03-25 DIAGNOSIS — I10 Essential (primary) hypertension: Secondary | ICD-10-CM | POA: Diagnosis not present

## 2017-03-25 DIAGNOSIS — R0789 Other chest pain: Secondary | ICD-10-CM | POA: Insufficient documentation

## 2017-03-25 DIAGNOSIS — Z79899 Other long term (current) drug therapy: Secondary | ICD-10-CM | POA: Diagnosis not present

## 2017-03-25 DIAGNOSIS — D649 Anemia, unspecified: Secondary | ICD-10-CM | POA: Diagnosis not present

## 2017-03-25 DIAGNOSIS — E119 Type 2 diabetes mellitus without complications: Secondary | ICD-10-CM | POA: Diagnosis not present

## 2017-03-25 DIAGNOSIS — I2510 Atherosclerotic heart disease of native coronary artery without angina pectoris: Secondary | ICD-10-CM | POA: Diagnosis not present

## 2017-03-25 DIAGNOSIS — R079 Chest pain, unspecified: Secondary | ICD-10-CM | POA: Diagnosis not present

## 2017-03-25 DIAGNOSIS — I5040 Unspecified combined systolic (congestive) and diastolic (congestive) heart failure: Secondary | ICD-10-CM | POA: Diagnosis not present

## 2017-03-25 DIAGNOSIS — M6281 Muscle weakness (generalized): Secondary | ICD-10-CM | POA: Diagnosis not present

## 2017-03-25 DIAGNOSIS — Z7901 Long term (current) use of anticoagulants: Secondary | ICD-10-CM

## 2017-03-25 DIAGNOSIS — N289 Disorder of kidney and ureter, unspecified: Secondary | ICD-10-CM | POA: Diagnosis not present

## 2017-03-25 DIAGNOSIS — Z7984 Long term (current) use of oral hypoglycemic drugs: Secondary | ICD-10-CM | POA: Diagnosis not present

## 2017-03-25 DIAGNOSIS — I252 Old myocardial infarction: Secondary | ICD-10-CM | POA: Insufficient documentation

## 2017-03-25 LAB — COMPREHENSIVE METABOLIC PANEL
ALT: 18 U/L (ref 14–54)
AST: 23 U/L (ref 15–41)
Albumin: 3.6 g/dL (ref 3.5–5.0)
Alkaline Phosphatase: 48 U/L (ref 38–126)
Anion gap: 11 (ref 5–15)
BUN: 38 mg/dL — ABNORMAL HIGH (ref 6–20)
CO2: 26 mmol/L (ref 22–32)
Calcium: 9.4 mg/dL (ref 8.9–10.3)
Chloride: 102 mmol/L (ref 101–111)
Creatinine, Ser: 1.39 mg/dL — ABNORMAL HIGH (ref 0.44–1.00)
GFR calc Af Amer: 42 mL/min — ABNORMAL LOW (ref >60)
GFR calc non Af Amer: 36 mL/min — ABNORMAL LOW (ref >60)
Glucose, Bld: 115 mg/dL — ABNORMAL HIGH (ref 65–99)
Potassium: 4.5 mmol/L (ref 3.5–5.1)
Sodium: 139 mmol/L (ref 135–145)
Total Bilirubin: 1.1 mg/dL (ref 0.3–1.2)
Total Protein: 6.7 g/dL (ref 6.5–8.1)

## 2017-03-25 LAB — CBC
HCT: 34.9 % — ABNORMAL LOW (ref 36.0–46.0)
Hemoglobin: 11.4 g/dL — ABNORMAL LOW (ref 12.0–15.0)
MCH: 29.5 pg (ref 26.0–34.0)
MCHC: 32.7 g/dL (ref 30.0–36.0)
MCV: 90.4 fL (ref 78.0–100.0)
Platelets: 302 10*3/uL (ref 150–400)
RBC: 3.86 MIL/uL — ABNORMAL LOW (ref 3.87–5.11)
RDW: 13.7 % (ref 11.5–15.5)
WBC: 6.4 10*3/uL (ref 4.0–10.5)

## 2017-03-25 LAB — ECHOCARDIOGRAM COMPLETE
AO mean calculated velocity dopler: 143 cm/s
AV Area VTI index: 0.92 cm2/m2
AV Area VTI: 1.49 cm2
AV Area mean vel: 1.34 cm2
AV Mean grad: 10 mmHg
AV Peak grad: 20 mmHg
AV VEL mean LVOT/AV: 0.43
AV area mean vel ind: 0.7 cm2/m2
AV peak Index: 0.77
AV pk vel: 221 cm/s
AV vel: 1.78
Ao pk vel: 0.48 m/s
E decel time: 183 msec
E/e' ratio: 20.33
FS: 26 % — AB (ref 28–44)
Height: 65 in
IVS/LV PW RATIO, ED: 1.15
LA ID, A-P, ES: 45 mm
LA diam end sys: 45 mm
LA diam index: 2.33 cm/m2
LA vol A4C: 72.3 ml
LA vol index: 32.5 mL/m2
LA vol: 62.7 mL
LV E/e' medial: 20.33
LV E/e'average: 20.33
LV PW d: 13.6 mm — AB (ref 0.6–1.1)
LV dias vol index: 50 mL/m2
LV dias vol: 96 mL (ref 46–106)
LV e' LATERAL: 6.64 cm/s
LV sys vol index: 25 mL/m2
LV sys vol: 48 mL — AB (ref 14–42)
LVOT SV: 82 mL
LVOT VTI: 26.2 cm
LVOT area: 3.14 cm2
LVOT diameter: 20 mm
LVOT peak VTI: 0.57 cm
LVOT peak grad rest: 4 mmHg
LVOT peak vel: 105 cm/s
Lateral S' vel: 21.8 cm/s
MV Dec: 183
MV Peak grad: 7 mmHg
MV pk A vel: 116 m/s
MV pk E vel: 135 m/s
Simpson's disk: 51
Stroke v: 49 ml
TAPSE: 23.4 mm
TDI e' lateral: 6.64
TDI e' medial: 4.9
VTI: 46.3 cm
Valve area index: 0.92
Valve area: 1.78 cm2
Weight: 2790.4 oz

## 2017-03-25 LAB — GLUCOSE, CAPILLARY
Glucose-Capillary: 138 mg/dL — ABNORMAL HIGH (ref 65–99)
Glucose-Capillary: 153 mg/dL — ABNORMAL HIGH (ref 65–99)
Glucose-Capillary: 155 mg/dL — ABNORMAL HIGH (ref 65–99)

## 2017-03-25 LAB — URINALYSIS, ROUTINE W REFLEX MICROSCOPIC
Bacteria, UA: NONE SEEN
Bilirubin Urine: NEGATIVE
Glucose, UA: NEGATIVE mg/dL
Ketones, ur: NEGATIVE mg/dL
Leukocytes, UA: NEGATIVE
Nitrite: NEGATIVE
Protein, ur: NEGATIVE mg/dL
Specific Gravity, Urine: 1.01 (ref 1.005–1.030)
pH: 7 (ref 5.0–8.0)

## 2017-03-25 LAB — DIFFERENTIAL
Basophils Absolute: 0 10*3/uL (ref 0.0–0.1)
Basophils Relative: 1 %
Eosinophils Absolute: 0.1 10*3/uL (ref 0.0–0.7)
Eosinophils Relative: 2 %
Lymphocytes Relative: 46 %
Lymphs Abs: 2.9 10*3/uL (ref 0.7–4.0)
Monocytes Absolute: 0.6 10*3/uL (ref 0.1–1.0)
Monocytes Relative: 9 %
Neutro Abs: 2.7 10*3/uL (ref 1.7–7.7)
Neutrophils Relative %: 42 %

## 2017-03-25 LAB — TROPONIN I
Troponin I: <0.03 ng/mL (ref <0.03)
Troponin I: 0.06 ng/mL (ref <0.03)
Troponin I: 0.07 ng/mL (ref <0.03)

## 2017-03-25 LAB — ETHANOL: Alcohol, Ethyl (B): <5 mg/dL (ref <5)

## 2017-03-25 LAB — RAPID URINE DRUG SCREEN, HOSP PERFORMED
Amphetamines: NOT DETECTED
Barbiturates: NOT DETECTED
Benzodiazepines: NOT DETECTED
Cocaine: NOT DETECTED
Opiates: NOT DETECTED
Tetrahydrocannabinol: NOT DETECTED

## 2017-03-25 LAB — PROTIME-INR
INR: 2.47
Prothrombin Time: 27.2 seconds — ABNORMAL HIGH (ref 11.4–15.2)

## 2017-03-25 LAB — APTT: aPTT: 30 seconds (ref 24–36)

## 2017-03-25 MED ORDER — ONDANSETRON HCL 4 MG/2ML IJ SOLN: 4.0000 mg | Freq: Four times a day (QID) | INTRAMUSCULAR | Status: DC | PRN

## 2017-03-25 MED ORDER — WARFARIN SODIUM 7.5 MG PO TABS
7.5000 mg | ORAL_TABLET | Freq: Once | ORAL | Status: AC
  Administered 2017-03-25: 7.5 mg via ORAL
  Filled 2017-03-25: qty 3

## 2017-03-25 MED ORDER — NITROGLYCERIN 0.4 MG SL SUBL: 0.4000 mg | SUBLINGUAL_TABLET | SUBLINGUAL | Status: DC | PRN

## 2017-03-25 MED ORDER — ASPIRIN EC 81 MG PO TBEC
81.0000 mg | DELAYED_RELEASE_TABLET | Freq: Every day | ORAL | Status: DC
  Administered 2017-03-25 – 2017-03-27 (×3): 81 mg via ORAL
  Filled 2017-03-25 (×3): qty 1

## 2017-03-25 MED ORDER — ACETAMINOPHEN 325 MG PO TABS
650.0000 mg | ORAL_TABLET | Freq: Four times a day (QID) | ORAL | Status: DC | PRN
  Administered 2017-03-25 – 2017-03-27 (×2): 650 mg via ORAL
  Filled 2017-03-25 (×2): qty 2

## 2017-03-25 MED ORDER — ASPIRIN 81 MG PO CHEW
324.0000 mg | CHEWABLE_TABLET | Freq: Once | ORAL | Status: AC
  Administered 2017-03-25: 324 mg via ORAL
  Filled 2017-03-25: qty 4

## 2017-03-25 MED ORDER — WARFARIN - PHARMACIST DOSING INPATIENT
Status: DC
  Administered 2017-03-26: 18:00:00

## 2017-03-25 MED ORDER — METOPROLOL TARTRATE 50 MG PO TABS
50.0000 mg | ORAL_TABLET | Freq: Two times a day (BID) | ORAL | Status: DC
Start: 2017-03-25 — End: 2017-03-27
  Administered 2017-03-25 – 2017-03-27 (×4): 50 mg via ORAL
  Filled 2017-03-25 (×4): qty 1

## 2017-03-25 MED ORDER — ATORVASTATIN CALCIUM 10 MG PO TABS
10.0000 mg | ORAL_TABLET | Freq: Every day | ORAL | Status: DC
  Administered 2017-03-25 – 2017-03-27 (×3): 10 mg via ORAL
  Filled 2017-03-25 (×3): qty 1

## 2017-03-25 MED ORDER — ONDANSETRON HCL 4 MG PO TABS: 4.0000 mg | ORAL_TABLET | Freq: Four times a day (QID) | ORAL | Status: DC | PRN

## 2017-03-25 MED ORDER — ACETAMINOPHEN 650 MG RE SUPP: 650.0000 mg | Freq: Four times a day (QID) | RECTAL | Status: DC | PRN

## 2017-03-25 MED ORDER — STROKE: EARLY STAGES OF RECOVERY BOOK
Freq: Once | Status: AC
  Administered 2017-03-25: 12:00:00
  Filled 2017-03-25: qty 1

## 2017-03-25 NOTE — ED Notes (Signed)
Attempted 2hr neuro check, hospitalist at bedside, unable to complete at this time

## 2017-03-25 NOTE — Progress Notes (Signed)
CODE STROKE  0553 BEEPER 0606 EXAM STARTED 0608 EXAM FINISHED 0612 EXAM COMPLETE IN EPIC [facility] CALLED 0614 IMAGES SENT TO [facility]

## 2017-03-25 NOTE — ED Notes (Signed)
Urine specimen obtained and sent to lab

## 2017-03-25 NOTE — H&P (Signed)
History and Physical  Caitlin ATTWOOD WPY:[id number] DOB: 06-27-1941 DOA: 03/25/2017  PCP: Sinda Du, MD  Patient coming from: home  Chief Complaint: left arm numbness  HPI:  76 year old woman PMH atrial flutter on warfarin, bilateral pulmonary embolism, coronary artery disease, diabetes mellitus, ischemic cardiomyopathy, combined systolic and diastolic congestive heart failure LVEF 40-45% who presented to the emergency department after waking up this morning with left arm numbness and a strange sensation in her chest. Noted to have left upper extremity weakness, facial weakness in the emergency department and referred for TIA evaluation and ACS rule out.  Patient was feeling well yesterday. Last seen normal about 10 PM last night. This morning she woke up between 4 and 5 AM and her left arm was completely numb. She was able to use it and did not notice weakness. She is right-handed. This numbness gradually improved without particular aggravating or alleviating factors, now she only has numbness in her left fingertips. Associated with this she had a strange sensation in her heart "like half of my hardest". No chest pain. No aggravating or alleviating factors. She has not had these symptoms before.  ED Course: Last seen normal 7.5 hours prior to presentation. Noted to have left upper extremity weakness, left pronator droop. Possible left central facial droop. Not a candidate for TPA secondary to anticoagulant use. Code stroke was activated, CT head showed age indeterminate lacunar infarcts new from previous CT. Swallow screen passed. Treated with aspirin.   Review of Systems:  Negative for fever, visual changes, sore throat, rash, new muscle aches, chest pain, shortness of breath, dysuria, bleeding, nausea, vomiting, abdominal pain.    Past Medical History:  Diagnosis Date  . Atrial flutter ([place])    typical appearing  . Bilateral pulmonary embolism ([place]) 08/2008   On coumadin  .  Coronary artery disease    s/p DES to mid RCA in 02/2008; s/p inferior MI with DES to mid RCA and mid LAD in 05/2005  . Coronary atherosclerosis   . Diabetes mellitus without complication ([place])   . Hyperlipidemia   . Hypertension    Controlled  . Ischemic cardiomyopathy   . Myocardial infarction ([place])   . Unspecified combined systolic and diastolic heart failure    Asymptomatic. Echo 04/28/13 EF= 40- 45%  . Unspecified combined systolic and diastolic heart failure     Past Surgical History:  Procedure Laterality Date  . ATRIAL FLUTTER ABLATION N/A 06/17/2013   Procedure: ATRIAL FLUTTER ABLATION;  Surgeon: Thompson Grayer, MD;  Location: [facility] CATH LAB;  Service: Cardiovascular;  Laterality: N/A;  . CHOLECYSTECTOMY    . HIP SURGERY       reports that she quit smoking about 38 years ago. Her smoking use included Cigarettes. She quit after 10.00 years of use. She has never used smokeless tobacco. She reports that she does not drink alcohol or use drugs. Mobility: Ambulatory  No Known Allergies  Family History  Problem Relation Age of Onset  . Heart disease Unknown   . Arthritis Unknown   . Cancer Unknown   . Diabetes Unknown   . Heart failure Mother      Prior to Admission medications   Medication Sig Start Date End Date Taking? Authorizing Provider  amLODipine (NORVASC) 5 MG tablet Take 5 mg by mouth daily.    [provider]  aspirin EC 81 MG tablet Take 81 mg by mouth daily.    [provider]  Coenzyme Q10 (CO Q 10 PO) Take 1  tablet by mouth daily.    [provider]  enalapril (VASOTEC) 10 MG tablet Take 1 tablet (10 mg total) by mouth 2 (two) times daily. 05/27/14   Belva Crome, MD  hydrochlorothiazide (HYDRODIURIL) 25 MG tablet TAKE ONE TABLET BY MOUTH ONCE DAILY **NEEDS AN APPOINTMENT** 03/07/15   Belva Crome, MD  metFORMIN (GLUCOPHAGE) 500 MG tablet Take 1 tablet (500 mg total) by mouth 2 (two) times daily with a meal. 10/12/14   Sinda Du, MD  metoprolol (LOPRESSOR) 50 MG tablet Take 50 mg by mouth 2 (two) times daily.    [provider]  nitroGLYCERIN (NITROSTAT) 0.4 MG SL tablet Place 0.4 mg under the tongue every 5 (five) minutes as needed for chest pain. 3 tablets maximum    [provider]  Omega-3 Fatty Acids (OMEGA 3 PO) Take 1 capsule by mouth daily. Omega [place] [employer]    [provider]  vitamin C (ASCORBIC ACID) 500 MG tablet Take 500 mg by mouth daily.    [provider]  vitamin E 400 UNIT capsule Take 400 Units by mouth daily.    [provider]  warfarin (COUMADIN) 5 MG tablet Take 5-10 mg by mouth daily at 6 PM. Take 5 mg on Monday, Tuesday, Wednesday, Friday, Saturday and Sunday.  Take 10 mg on Thursday.    [provider]    Physical Exam:   Vitals: Afebrile, vital signs temporal. Temperature 98.0. Respirations 14, pulse 81, blood pressure 122/69  Constitutional: Appears calm, comfortable.  Eyes: Pupils, irises, lids appear normal.  ENT: Grossly normal hearing, lips and tongue.  Neck: No lymphadenopathy or masses. No thyromegaly.  Cardiovascular: Regular rate and rhythm. No murmur, rub or gallop. No lower extremity edema.  Respiratory: Clear to auscultation bilaterally. No wheezes, rales or rhonchi. Normal respiratory effort.  Abdomen soft, nontender, nondistended. No hernias noted. No hepatomegaly noted.  Skin: No rash or induration. Nontender to palpation.  Psychiatric: Grossly normal mood and affect. Speech fluent and appropriate.  Musculoskeletal: Right upper and right lower extremity strength 5/5. Left upper extremity strength 4/5. Left lower extremity strength 5/5. No abnormal movements noted. Digits of the upper extremity. Normal.  Neurologic: There appears to be some slight facial weakness which is most notable in the patient smiles. Otherwise cranial nerves appear intact. There is slight pronator drift of the left upper extremity. No  upper extremity dysdiadochokinesis.  Wt Readings from Last 3 Encounters:  03/25/17 77.1 kg (170 lb)  04/22/16 76.7 kg (169 lb)  06/24/15 72.6 kg (160 lb)    I have personally reviewed following labs and imaging studies  Labs:   Complete metabolic panel unremarkable. Creatinine at baseline 1.39.  Troponin negative  Hemoglobin stable, 11.4.  INR 2.47  Urinalysis unremarkable  Serum alcohol negative  Urine drug screen negative  Imaging studies:   CT head age-indeterminate basal ganglia lacunar infarcts. Severe atherosclerosis.  Medical tests:   EKG sinus rhythm, no acute changes. Independently reviewed.  Test discussed with performing physician:    Decision to obtain old records:     Review and summation of old records:   Echocardiogram December 2015: LVEF 55%. Grade 2 diastolic dysfunction.   Cardiology office visit in July 2017: Paroxysmal atrial flutter, in sinus rhythm. Coronary artery disease with normal LVEF.  Hospitalization December 2015: Hypoglycemia, facial droop. TIA evaluation was unremarkable. Thought to be secondary to hypoglycemia.  Principal Problem:   Left arm numbness Active Problems:   Atrial flutter ([place])  Essential hypertension   CAD (coronary artery disease)   Facial droop   Long term (current) use of anticoagulants [Z79.01]   Chest pain   DM type 2 (diabetes mellitus, type 2) (HCC)   CKD (chronic kidney disease), stage III   Assessment/Plan #1 Left arm numbness, weakness, last seen normal 7.5 hours prior to presentation. Not a candidate for TPA secondary to anticoagulant use. Code stroke was activated, CT head showed age indeterminate lacunar infarcts new from previous CT. Swallow screen passed. Treated with aspirin. -Persistent facial weakness is suspicious for CVA rather than TIA. -Pursue stroke evaluation. Permissive hypertension. -Continue statin  #2: Vague chest discomfort treated with nitroglycerin. Troponin normal, EKG  nonacute. -Very atypical. Rule out ACS with serial troponin.  #3: Coronary artery disease -Continue aspirin, statin  #4: Chronic diastolic dysfunction grade 2. Appears well compensated. -Metoprolol, ACE inhibitor,  #5: Paroxysmal atrial flutter -Currently in sinus rhythm. Continue warfarin pending MRI report  #6: Diabetes mellitus type 2 -Glucose stable. Hold metformin while an inpatient.  #7: Essential hypertension. -Hold Norvasc, enalapril, hydrochlorothiazide for now  #8: Chronic kidney disease stage III, at baseline. Follow clinically.  PMH: bilateral pulmonary embolism   Severity of Illness: The appropriate patient status for this patient is OBSERVATION. Observation status is judged to be reasonable and necessary in order to provide the required intensity of service to ensure the patient's safety. The patient's presenting symptoms, physical exam findings, and initial radiographic and laboratory data in the context of their medical condition is felt to place them at decreased risk for further clinical deterioration. Furthermore, it is anticipated that the patient will be medically stable for discharge from the hospital within 2 midnights of admission. The following factors support the patient status of observation.   " The patient's presenting symptoms include left arm numbness, vague chest discomfort " The physical exam findings include left facial droop, left arm weakness with pronator drift. " The initial radiographic and laboratory data are indeterminant stroke on CT.   DVT prophylaxis:warfarin Code Status: full Family Communication: daughter at bedside   Time spent: 71 minutes  Murray Hodgkins, MD  [facility] Direct contact: [phone] --[place] [place]  --[email or web address]; password [email or web address]  7PM-7AM contact night coverage as above  03/25/2017, 9:27 AM

## 2017-03-25 NOTE — ED Provider Notes (Signed)
[facility] DEPT Provider Note   CSN: [id number] Arrival date & time: 03/25/17  0522     History   Chief Complaint Chief Complaint  Patient presents with  . Numbness    HPI [name] is a 76 y.o. female.  The history is provided by the patient.  She woke up at about 5 AM and noted numbness in her left arm. She was not aware of any weakness, but her son has noted that she was having trouble using her arm. Sent is also noted that her speech is not quite normal for her, although she did not notice any problem with her speech. She is also complaining of a discomfort in her chest which she is unable to describe. She denies any numbness or weakness in her face or arm. She was last seen normal at about 10 PM when she went to sleep. She does have history of TIA, but this was actually felt to be due to hypoglycemia and not from a vascular cause. She is treated for atrial flutter and is on warfarin. Since waking up, symptoms have been stable.  Past Medical History:  Diagnosis Date  . Atrial flutter ([place])    typical appearing  . Bilateral pulmonary embolism ([place]) 08/2008   On coumadin  . Coronary artery disease    s/p DES to mid RCA in 02/2008; s/p inferior MI with DES to mid RCA and mid LAD in 05/2005  . Coronary atherosclerosis   . Diabetes mellitus without complication ([place])   . Hyperlipidemia   . Hypertension    Controlled  . Ischemic cardiomyopathy   . Myocardial infarction ([place])   . Unspecified combined systolic and diastolic heart failure    Asymptomatic. Echo 04/28/13 EF= 40- 45%  . Unspecified combined systolic and diastolic heart failure     Patient Active Problem List   Diagnosis Date Noted  . Long term (current) use of anticoagulants [Z79.01] 04/22/2016  . Hypoglycemia 10/11/2014  . Facial droop   . Encounter for therapeutic drug monitoring 12/01/2013  . Atrial flutter ([place]) 06/09/2013  . Essential hypertension 06/09/2013  . CAD (coronary artery disease)  06/09/2013  . CLOSED FRACTURE OF SURGICAL NECK OF HUMERUS 04/12/2010  . HIP, ARTHRITIS, DEGEN./OSTEO 03/01/2009  . HIP PAIN 03/01/2009    Past Surgical History:  Procedure Laterality Date  . ATRIAL FLUTTER ABLATION N/A 06/17/2013   Procedure: ATRIAL FLUTTER ABLATION;  Surgeon: Thompson Grayer, MD;  Location: [facility] CATH LAB;  Service: Cardiovascular;  Laterality: N/A;  . CHOLECYSTECTOMY    . HIP SURGERY      OB History    No data available       Home Medications    Prior to Admission medications   Medication Sig Start Date End Date Taking? Authorizing Provider  amLODipine (NORVASC) 5 MG tablet Take 5 mg by mouth daily.    [provider]  aspirin EC 81 MG tablet Take 81 mg by mouth daily.    [provider]  Coenzyme Q10 (CO Q 10 PO) Take 1 tablet by mouth daily.    [provider]  enalapril (VASOTEC) 10 MG tablet Take 1 tablet (10 mg total) by mouth 2 (two) times daily. 05/27/14   Belva Crome, MD  hydrochlorothiazide (HYDRODIURIL) 25 MG tablet TAKE ONE TABLET BY MOUTH ONCE DAILY **NEEDS AN APPOINTMENT** 03/07/15   Belva Crome, MD  metFORMIN (GLUCOPHAGE) 500 MG tablet Take 1 tablet (500 mg total) by mouth 2 (two) times daily with a meal.  10/12/14   Sinda Du, MD  metoprolol (LOPRESSOR) 50 MG tablet Take 50 mg by mouth 2 (two) times daily.    [provider]  nitroGLYCERIN (NITROSTAT) 0.4 MG SL tablet Place 0.4 mg under the tongue every 5 (five) minutes as needed for chest pain. 3 tablets maximum    [provider]  Omega-3 Fatty Acids (OMEGA 3 PO) Take 1 capsule by mouth daily. Omega [place] [employer]    [provider]  vitamin C (ASCORBIC ACID) 500 MG tablet Take 500 mg by mouth daily.    [provider]  vitamin E 400 UNIT capsule Take 400 Units by mouth daily.    [provider]  warfarin (COUMADIN) 5 MG tablet Take 5-10 mg by mouth daily at 6 PM. Take 5 mg on Monday, Tuesday, Wednesday, Friday, Saturday and  Sunday.  Take 10 mg on Thursday.    [provider]    Family History Family History  Problem Relation Age of Onset  . Heart disease Unknown   . Arthritis Unknown   . Cancer Unknown   . Diabetes Unknown     Social History Social History  Substance Use Topics  . Smoking status: Former Smoker    Years: 10.00    Types: Cigarettes    Quit date: 10/14/1978  . Smokeless tobacco: Never Used  . Alcohol use No     Allergies   Patient has no known allergies.   Review of Systems Review of Systems  All other systems reviewed and are negative.    Physical Exam Updated Vital Signs BP (!) 182/65   Pulse 84   Temp 98 F (36.7 C)   Resp 18   Ht 5\' 5"  (1.651 m)   Wt 77.1 kg (170 lb)   SpO2 99%   BMI 28.29 kg/m   Physical Exam  Nursing note and vitals reviewed.  76 year old female, resting comfortably and in no acute distress. Vital signs are significant for hypertension. Oxygen saturation is 99%, which is normal. Head is normocephalic and atraumatic. PERRLA, EOMI. Oropharynx is clear. Neck is nontender and supple without adenopathy or JVD. There are no carotid bruits. Back is nontender and there is no CVA tenderness. Lungs are clear without rales, wheezes, or rhonchi. Chest is nontender. Heart has regular rate and rhythm without murmur. Abdomen is soft, flat, nontender without masses or hepatosplenomegaly and peristalsis is normoactive. Extremities have no cyanosis or edema, full range of motion is present. Skin is warm and dry without rash. Neurologic: She is awake, alert, oriented. Cranial nerves are significant for questionable left central facial droop. Speech is normal. There is mild left pronator drift. Left arm strength is 4/10 with right arm strength 5/10. Leg strength is 4/10 bilaterally.  ED Treatments / Results  Labs (all labs ordered are listed, but only abnormal results are displayed) Labs Reviewed  PROTIME-INR - Abnormal; Notable for the following:        Result Value   Prothrombin Time 27.2 (*)    All other components within normal limits  CBC - Abnormal; Notable for the following:    RBC 3.86 (*)    Hemoglobin 11.4 (*)    HCT 34.9 (*)    All other components within normal limits  COMPREHENSIVE METABOLIC PANEL - Abnormal; Notable for the following:    Glucose, Bld 115 (*)    BUN 38 (*)    Creatinine, Ser 1.39 (*)    GFR calc non Af Amer 36 (*)  GFR calc Af Amer 42 (*)    All other components within normal limits  URINALYSIS, ROUTINE W REFLEX MICROSCOPIC - Abnormal; Notable for the following:    Color, Urine STRAW (*)    Hgb urine dipstick SMALL (*)    Squamous Epithelial / LPF 0-5 (*)    All other components within normal limits  ETHANOL  APTT  DIFFERENTIAL  RAPID URINE DRUG SCREEN, HOSP PERFORMED  TROPONIN I    EKG  EKG Interpretation  Date/Time:  Tuesday March 25 2017 05:33:45 EDT Ventricular Rate:  78 PR Interval:    QRS Duration: 109 QT Interval:  396 QTC Calculation: 452 R Axis:   -12 Text Interpretation:  Sinus rhythm Low voltage, extremity leads Nonspecific repol abnormality, lateral leads When compared with ECG of 06/24/2015, No significant change was found Confirmed by Delora Fuel ([id number]) on 03/25/2017 5:36:14 AM       Radiology Ct Head Wo Contrast  Result Date: 03/25/2017 CLINICAL DATA:  LEFT arm pain this morning, with weakness and numbness. Assess for stroke. History of diabetes, hypertension. EXAM: CT HEAD WITHOUT CONTRAST TECHNIQUE: Contiguous axial images were obtained from the base of the skull through the vertex without intravenous contrast. COMPARISON:  CT HEAD September 23, 2015 and MRI of the head October 12, 2015 FINDINGS: BRAIN: No intraparenchymal hemorrhage, mass effect nor midline shift. The ventricles and sulci are normal for age. Patchy supratentorial white matter hypodensities less than expected for patient's age, though non-specific are most compatible with chronic small vessel ischemic  disease. Age indeterminate faint bilateral basal ganglia lacunar infarcts. No acute large vascular territory infarcts. No abnormal extra-axial fluid collections. Basal cisterns are patent. VASCULAR: Severe calcific atherosclerosis of the carotid siphons and intradural vertebral artery's. SKULL: No skull fracture. No significant scalp soft tissue swelling. SINUSES/ORBITS: The mastoid air-cells and included paranasal sinuses are well-aerated.The included ocular globes and orbital contents are non-suspicious. Status post bilateral ocular lens implants. OTHER: None. IMPRESSION: Age indeterminate basal ganglia lacunar infarcts. Severe atherosclerosis. Critical Value/emergent results were called by telephone at the time of interpretation on 03/25/2017 at 6:20 am to Dr. Delora Fuel , who verbally acknowledged these results. Electronically Signed   By: Elon Alas M.D.   On: 03/25/2017 06:24    Procedures Procedures (including critical care time) CRITICAL CARE Performed by: ZWCHE,NIDPO Total critical care time: 45 minutes Critical care time was exclusive of separately billable procedures and treating other patients. Critical care was necessary to treat or prevent imminent or life-threatening deterioration. Critical care was time spent personally by me on the following activities: development of treatment plan with patient and/or surrogate as well as nursing, discussions with consultants, evaluation of patient's response to treatment, examination of patient, obtaining history from patient or surrogate, ordering and performing treatments and interventions, ordering and review of laboratory studies, ordering and review of radiographic studies, pulse oximetry and re-evaluation of patient's condition.  Medications Ordered in ED Medications - No data to display   Initial Impression / Assessment and Plan / ED Course  I have reviewed the triage vital signs and the nursing notes.  Pertinent labs & imaging  results that were available during my care of the patient were reviewed by me and considered in my medical decision making (see chart for details).  Left arm numbness and weakness worrisome for stroke or TIA. Last seen normal is 7.5 hours ago. She is not a thrombolytic candidate because she is on anticoagulants. Code stroke is activated and she is sent for CT  of head. Old records are reviewed, and she did have an admission in 2015 for TIA, which was actually felt to be due to hypoglycemia.  Glucose is normal. CT shows age indeterminate lacunar infarcts which are new from prior CT. Laboratory workup shows therapeutic INR, mild anemia which is unchanged from baseline, mild renal insufficiency which is unchanged from baseline. Swallow screen is normal, and she is given oral aspirin. Her numbness has started to resolve and she states that she is only numb in her fingertips, but she still has some vague chest discomfort. She will be given nitroglycerin. Initial troponin is normal and ECG is unchanged from baseline. He she was seen by tele-neurology who agreed that there is no indication for thrombolytic therapy, and that she would need to be evaluated for TIA as well as cardiac ischemia. Last TIA workup was 3.5 years ago, so all studies will need to be repeated. Case is discussed with Dr. Shanon Brow of [facility] who agrees to admit the patient.  Final Clinical Impressions(s) / ED Diagnoses   Final diagnoses:  Left arm numbness  Left arm weakness  Chest discomfort  Anticoagulated on warfarin  Renal insufficiency  Normochromic normocytic anemia    New Prescriptions New Prescriptions   No medications on file     Delora Fuel, MD 61/51/83 [phone]

## 2017-03-25 NOTE — Progress Notes (Signed)
ANTICOAGULATION CONSULT NOTE - Initial Consult  Pharmacy Consult for Warfarin Indication: atrial fibrillation  No Known Allergies  Patient Measurements: Height: 5\' 5"  (165.1 cm) Weight: 174 lb 6.4 oz (79.1 kg) IBW/kg (Calculated) : 57  Vital Signs: Temp: 99.6 F (37.6 C) (06/12 1700) Temp Source: Oral (06/12 1700) BP: 139/50 (06/12 1700) Pulse Rate: 67 (06/12 1700)  Labs:  Recent Labs  03/25/17 0556 03/25/17 1341 03/25/17 1731  HGB 11.4*  --   --   HCT 34.9*  --   --   PLT 302  --   --   APTT 30  --   --   LABPROT 27.2*  --   --   INR 2.47  --   --   CREATININE 1.39*  --   --   TROPONINI <0.03 0.06* 0.07*   Estimated Creatinine Clearance: 35.8 mL/min (A) (by C-G formula based on SCr of 1.39 mg/dL (H)).  Medical History: Past Medical History:  Diagnosis Date  . Atrial flutter ([place])    typical appearing  . Bilateral pulmonary embolism ([place]) 08/2008   On coumadin  . Coronary artery disease    s/p DES to mid RCA in 02/2008; s/p inferior MI with DES to mid RCA and mid LAD in 05/2005  . Coronary atherosclerosis   . Diabetes mellitus without complication ([place])   . Hyperlipidemia   . Hypertension    Controlled  . Ischemic cardiomyopathy   . Myocardial infarction ([place])   . Unspecified combined systolic and diastolic heart failure    Asymptomatic. Echo 04/28/13 EF= 40- 45%  . Unspecified combined systolic and diastolic heart failure     Medications:  Prescriptions Prior to Admission  Medication Sig Dispense Refill Last Dose  . amLODipine (NORVASC) 5 MG tablet Take 5 mg by mouth daily.   03/24/2017 at Unknown time  . aspirin EC 81 MG tablet Take 81 mg by mouth daily.   03/24/2017 at Unknown time  . atorvastatin (LIPITOR) 10 MG tablet Take 10 mg by mouth daily.   03/24/2017 at Unknown time  . enalapril (VASOTEC) 10 MG tablet Take 1 tablet (10 mg total) by mouth 2 (two) times daily. 180 tablet 0 03/24/2017 at Unknown time  . hydrochlorothiazide (HYDRODIURIL) 25 MG  tablet TAKE ONE TABLET BY MOUTH ONCE DAILY **NEEDS AN APPOINTMENT** 15 tablet 0 03/24/2017 at Unknown time  . ibuprofen (ADVIL,MOTRIN) 800 MG tablet Take 800 mg by mouth daily as needed for moderate pain.   unknown  . metFORMIN (GLUCOPHAGE) 500 MG tablet Take 1 tablet (500 mg total) by mouth 2 (two) times daily with a meal. 60 tablet 5 03/24/2017 at Unknown time  . metoprolol (LOPRESSOR) 50 MG tablet Take 50 mg by mouth 2 (two) times daily.   03/24/2017 at 1700  . nitroGLYCERIN (NITROSTAT) 0.4 MG SL tablet Place 0.4 mg under the tongue every 5 (five) minutes as needed for chest pain. 3 tablets maximum   unknown  . Omega-3 Fatty Acids (OMEGA 3 PO) Take 1 capsule by mouth daily. Omega [place]   03/24/2017 at Unknown time  . vitamin C (ASCORBIC ACID) 500 MG tablet Take 500 mg by mouth daily.   03/24/2017 at Unknown time  . vitamin E 400 UNIT capsule Take 400 Units by mouth daily.   03/24/2017 at Unknown time  . warfarin (COUMADIN) 5 MG tablet Take 5-7.5 mg by mouth daily at 6 PM. Take 7.5 mg on Monday, Tuesday, Thursday, Friday, Sunday. Take 5 mg on Wednesday and Saturday.  03/24/2017 at 1700   Assessment: Okay for Protocol, INR at goal.  CT and MRI (-) acute bleed  Goal of Therapy:  INR 2-3  Plan:  Warfarin 7.5mg  PO x 1 Daily PT/INR Monitor for signs and symptoms of bleeding.   Pricilla Larsson 03/25/2017,6:44 PM

## 2017-03-25 NOTE — ED Notes (Signed)
Pt states face numbness now on the left side.

## 2017-03-25 NOTE — Care Management Obs Status (Signed)
[facility] NOTIFICATION   Patient Details  Name: [name] MRN: [id number] Date of Birth: [birth year]   Medicare Observation Status Notification Given:  Yes    Sherald Barge, RN 03/25/2017, 2:50 PM

## 2017-03-25 NOTE — ED Notes (Signed)
Paged [place] @ [phone] Notified [place] @ [phone]

## 2017-03-25 NOTE — Progress Notes (Signed)
*  PRELIMINARY RESULTS* Echocardiogram 2D Echocardiogram has been performed by Leavy Cella, RDCS.  [name] 03/25/2017, 3:17 PM

## 2017-03-25 NOTE — ED Notes (Signed)
Pt stating her left arm was numb this morning when she got up to use the restroom & her heart feels like it is dead. Pt having sharp pins down her arm.

## 2017-03-25 NOTE — ED Triage Notes (Signed)
Pt c/o left arm numbness/pain that radiates to left chest. Pt states arm was fine at 2200

## 2017-03-25 NOTE — ED Notes (Signed)
Pt says arm numbness is better only fingers are numb now.

## 2017-03-25 NOTE — Progress Notes (Signed)
Patient off the unit for test at the 1300 time for assessment.

## 2017-03-26 DIAGNOSIS — I10 Essential (primary) hypertension: Secondary | ICD-10-CM | POA: Diagnosis not present

## 2017-03-26 DIAGNOSIS — Z8679 Personal history of other diseases of the circulatory system: Secondary | ICD-10-CM | POA: Diagnosis not present

## 2017-03-26 DIAGNOSIS — G459 Transient cerebral ischemic attack, unspecified: Secondary | ICD-10-CM | POA: Diagnosis not present

## 2017-03-26 DIAGNOSIS — I5032 Chronic diastolic (congestive) heart failure: Secondary | ICD-10-CM

## 2017-03-26 DIAGNOSIS — I25118 Atherosclerotic heart disease of native coronary artery with other forms of angina pectoris: Secondary | ICD-10-CM | POA: Diagnosis not present

## 2017-03-26 DIAGNOSIS — I6529 Occlusion and stenosis of unspecified carotid artery: Secondary | ICD-10-CM | POA: Diagnosis not present

## 2017-03-26 DIAGNOSIS — Z9889 Other specified postprocedural states: Secondary | ICD-10-CM

## 2017-03-26 DIAGNOSIS — I4891 Unspecified atrial fibrillation: Secondary | ICD-10-CM | POA: Diagnosis not present

## 2017-03-26 DIAGNOSIS — R200 Anesthesia of skin: Secondary | ICD-10-CM | POA: Diagnosis not present

## 2017-03-26 DIAGNOSIS — Z7901 Long term (current) use of anticoagulants: Secondary | ICD-10-CM | POA: Diagnosis not present

## 2017-03-26 DIAGNOSIS — N183 Chronic kidney disease, stage 3 (moderate): Secondary | ICD-10-CM | POA: Diagnosis not present

## 2017-03-26 LAB — LIPID PANEL
Cholesterol: 149 mg/dL (ref 0–200)
HDL: 50 mg/dL (ref >40)
LDL Cholesterol: 79 mg/dL (ref 0–99)
Total CHOL/HDL Ratio: 3 RATIO
Triglycerides: 98 mg/dL (ref <150)
VLDL: 20 mg/dL (ref 0–40)

## 2017-03-26 LAB — PROTIME-INR
INR: 2.43
Prothrombin Time: 26.9 seconds — ABNORMAL HIGH (ref 11.4–15.2)

## 2017-03-26 LAB — TROPONIN I: Troponin I: 0.08 ng/mL (ref <0.03)

## 2017-03-26 MED ORDER — VITAMIN C 500 MG PO TABS
500.0000 mg | ORAL_TABLET | Freq: Every day | ORAL | Status: DC
  Administered 2017-03-26 – 2017-03-27 (×2): 500 mg via ORAL
  Filled 2017-03-26 (×2): qty 1

## 2017-03-26 MED ORDER — HYDROCHLOROTHIAZIDE 25 MG PO TABS
25.0000 mg | ORAL_TABLET | Freq: Every day | ORAL | Status: DC
  Administered 2017-03-26 – 2017-03-27 (×2): 25 mg via ORAL
  Filled 2017-03-26 (×2): qty 1

## 2017-03-26 MED ORDER — VITAMIN E 180 MG (400 UNIT) PO CAPS
400.0000 [IU] | ORAL_CAPSULE | Freq: Every day | ORAL | Status: DC
  Administered 2017-03-26 – 2017-03-27 (×2): 400 [IU] via ORAL
  Filled 2017-03-26 (×2): qty 1

## 2017-03-26 MED ORDER — AMLODIPINE BESYLATE 5 MG PO TABS
5.0000 mg | ORAL_TABLET | Freq: Every day | ORAL | Status: DC
  Administered 2017-03-26 – 2017-03-27 (×2): 5 mg via ORAL
  Filled 2017-03-26 (×2): qty 1

## 2017-03-26 MED ORDER — ENALAPRIL MALEATE 5 MG PO TABS
10.0000 mg | ORAL_TABLET | Freq: Two times a day (BID) | ORAL | Status: DC
  Administered 2017-03-26 – 2017-03-27 (×3): 10 mg via ORAL
  Filled 2017-03-26 (×3): qty 2

## 2017-03-26 MED ORDER — OMEGA-3-ACID ETHYL ESTERS 1 G PO CAPS
ORAL_CAPSULE | Freq: Every day | ORAL | Status: DC
  Administered 2017-03-26 – 2017-03-27 (×2): 1 g via ORAL
  Filled 2017-03-26 (×2): qty 1

## 2017-03-26 MED ORDER — METFORMIN HCL 500 MG PO TABS
500.0000 mg | ORAL_TABLET | Freq: Two times a day (BID) | ORAL | Status: DC
Start: 2017-03-26 — End: 2017-03-27
  Administered 2017-03-26 – 2017-03-27 (×3): 500 mg via ORAL
  Filled 2017-03-26 (×3): qty 1

## 2017-03-26 MED ORDER — WARFARIN SODIUM 5 MG PO TABS
5.0000 mg | ORAL_TABLET | Freq: Once | ORAL | Status: AC
  Administered 2017-03-26: 5 mg via ORAL
  Filled 2017-03-26: qty 1

## 2017-03-26 NOTE — Progress Notes (Signed)
PT Cancellation Note  Patient Details Name: [name] MRN: [id number] DOB: September 03, 1941   Cancelled Treatment:    Reason Eval/Treat Not Completed: PT screened, no needs identified, will sign off (Discussed with OT p OP evaluation. Pt without changes to funcitonal mobility, AMB freely in room. Sx resolved at this time. No PT needed at this time. PT signing off. )  9:11 AM, 03/26/17 Etta Grandchild, PT, DPT Physical Therapist - [place] [phone] (Office)    Buccola,Allan C 03/26/2017, 9:11 AM

## 2017-03-26 NOTE — Progress Notes (Signed)
SLP Cancellation Note  Patient Details Name: [name] MRN: [id number] DOB: [birth year]   Cancelled treatment:       Reason Eval/Treat Not Completed: SLP screened, no needs identified, will sign off; MRI negative for new infarct   Elvina Sidle, M.S., CCC-SLP 03/26/2017, 10:04 AM

## 2017-03-26 NOTE — Evaluation (Signed)
Occupational Therapy Evaluation Patient Details Name: [name] MRN: [id number] DOB: 1940-12-16 Today's Date: 03/26/2017    History of Present Illness 76 year old woman PMH atrial flutter on warfarin, bilateral pulmonary embolism, coronary artery disease, diabetes mellitus, ischemic cardiomyopathy, combined systolic and diastolic congestive heart failure LVEF 40-45% who presented to the emergency department after waking up this morning with left arm numbness and a strange sensation in her chest. Noted to have left upper extremity weakness, facial weakness in the emergency department and referred for TIA evaluation and ACS rule out.   Clinical Impression   Pt received semi-reclined in bed, daughter present, agreeable to OT evaluation. Daughter with concerns about pt care and observation status, questions answered. 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 Pt symptoms have resolved with exception of tingling in fingertips, however light touch is intact. LUE strength is WFL, pt is at baseline with ADL completion, no further OT needs at this time.     Follow Up Recommendations  No OT follow up    Equipment Recommendations  None recommended by OT       Precautions / Restrictions Precautions Precautions: Fall Restrictions Weight Bearing Restrictions: No      Mobility Bed Mobility Overal bed mobility: Modified Independent                Transfers Overall transfer level: Modified independent                        ADL either performed or assessed with clinical judgement   ADL Overall ADL's : Modified independent;At baseline                                             Vision Baseline Vision/History: Wears glasses Wears Glasses: At all times Patient Visual Report: No change from baseline Vision Assessment?: No apparent visual deficits            Pertinent Vitals/Pain  Pain Assessment: No/denies pain     Hand Dominance Right   Extremity/Trunk Assessment Upper Extremity Assessment Upper Extremity Assessment: LUE deficits/detail LUE Deficits / Details: tingling in fingertips, light touch is intact; strength is [facility]           Communication Communication Communication: No difficulties   Cognition Arousal/Alertness: Awake/alert Behavior During Therapy: WFL for tasks assessed/performed Overall Cognitive Status: Within Functional Limits for tasks assessed                                                Home Living Family/patient expects to be discharged to:: Private residence Living Arrangements: Spouse/significant other Available Help at Discharge: Family;Available PRN/intermittently Type of Home: House             Bathroom Shower/Tub: Teacher, early years/pre: Standard     Home Equipment: Environmental consultant - 2 wheels;Cane - single point          Prior Functioning/Environment Level of Independence: Independent with assistive device(s)        Comments: Pt uses SPC for functional mobility        OT Problem List: Impaired sensation       AM-PAC PT "6 Clicks" Daily Activity  Outcome Measure Help from another person eating meals?: None Help from another person taking care of personal grooming?: None Help from another person toileting, which includes using toliet, bedpan, or urinal?: None Help from another person bathing (including washing, rinsing, drying)?: None Help from another person to put on and taking off regular upper body clothing?: None Help from another person to put on and taking off regular lower body clothing?: None 6 Click Score: 24   End of Session    Activity Tolerance: Patient tolerated treatment well Patient left: in bed;with call bell/phone within reach;with family/visitor present  OT Visit Diagnosis: Muscle weakness (generalized) (M62.81)                Time: 7048-8891 OT Time  Calculation (min): 36 min Charges:  OT General Charges $OT Visit: 1 Procedure OT Evaluation $OT Eval Low Complexity: 1 Procedure G-Codes: OT G-codes **NOT FOR INPATIENT CLASS** Functional Assessment Tool Used: AM-PAC 6 Clicks Daily Activity Functional Limitation: Self care Self Care Current Status (Q9450): 0 percent impaired, limited or restricted Self Care Goal Status (T8882): 0 percent impaired, limited or restricted Self Care Discharge Status (C0034): 0 percent impaired, limited or restricted    Guadelupe Sabin, OTR/L  [phone] 03/26/2017, 8:59 AM

## 2017-03-26 NOTE — Progress Notes (Signed)
Subjective: Patient was admitted due to left arm numbness. Her CT Scan showed lacunar infarct but MRI showed no intracranial abnormality. Her carotid artery showed  50-69 on the right side and 70% stenosis on the left side. Her troponin is is elevated but echo normal. Patient has no chest pain now.   Objective: Vital signs in last 24 hours: Temp:  [98.3 F (36.8 C)-99.6 F (37.6 C)] 98.5 F (36.9 C) (06/13 0730) Pulse Rate:  [67-87] 72 (06/13 0730) Resp:  [16-20] 19 (06/13 0730) BP: (115-144)/(48-87) 125/48 (06/13 0730) SpO2:  [93 %-99 %] 96 % (06/13 0730) Weight:  [79.1 kg (174 lb 6.4 oz)] 79.1 kg (174 lb 6.4 oz) (06/12 1100) Weight change: 1.996 kg (4 lb 6.4 oz) Last BM Date: 03/24/17  Intake/Output from previous day: 06/12 0701 - 06/13 0700 In: 440 [P.O.:440] Out: 900 [Urine:900]  PHYSICAL EXAM General appearance: alert and no distress Resp: clear to auscultation bilaterally Cardio: regularly irregular rhythm GI: soft, non-tender; bowel sounds normal; no masses,  no organomegaly Extremities: extremities normal, atraumatic, no cyanosis or edema  Lab Results:  Results for orders placed or performed during the hospital encounter of 03/25/17 (from the past 48 hour(s))  Urine rapid drug screen (hosp performed)not at [facility]     Status: None   Collection Time: 03/25/17  5:50 AM  Result Value Ref Range   Opiates NONE DETECTED NONE DETECTED   Cocaine NONE DETECTED NONE DETECTED   Benzodiazepines NONE DETECTED NONE DETECTED   Amphetamines NONE DETECTED NONE DETECTED   Tetrahydrocannabinol NONE DETECTED NONE DETECTED   Barbiturates NONE DETECTED NONE DETECTED    Comment:        DRUG SCREEN FOR MEDICAL PURPOSES ONLY.  IF CONFIRMATION IS NEEDED FOR ANY PURPOSE, NOTIFY LAB WITHIN 5 DAYS.        LOWEST DETECTABLE LIMITS FOR URINE DRUG SCREEN Drug Class       Cutoff (ng/mL) Amphetamine      1000 Barbiturate      200 Benzodiazepine   200 Tricyclics       300 Opiates           300 Cocaine          300 THC              50   Urinalysis, Routine w reflex microscopic     Status: Abnormal   Collection Time: 03/25/17  5:50 AM  Result Value Ref Range   Color, Urine STRAW (A) YELLOW   APPearance CLEAR CLEAR   Specific Gravity, Urine 1.010 1.005 - 1.030   pH 7.0 5.0 - 8.0   Glucose, UA NEGATIVE NEGATIVE mg/dL   Hgb urine dipstick SMALL (A) NEGATIVE   Bilirubin Urine NEGATIVE NEGATIVE   Ketones, ur NEGATIVE NEGATIVE mg/dL   Protein, ur NEGATIVE NEGATIVE mg/dL   Nitrite NEGATIVE NEGATIVE   Leukocytes, UA NEGATIVE NEGATIVE   RBC / HPF 0-5 0 - 5 RBC/hpf   WBC, UA 0-5 0 - 5 WBC/hpf   Bacteria, UA NONE SEEN NONE SEEN   Squamous Epithelial / LPF 0-5 (A) NONE SEEN   Hyaline Casts, UA PRESENT   Ethanol     Status: None   Collection Time: 03/25/17  5:56 AM  Result Value Ref Range   Alcohol, Ethyl (B) <5 <5 mg/dL    Comment:        LOWEST DETECTABLE LIMIT FOR SERUM ALCOHOL IS 5 mg/dL FOR MEDICAL PURPOSES ONLY   Protime-INR     Status: Abnormal     Collection Time: 03/25/17  5:56 AM  Result Value Ref Range   Prothrombin Time 27.2 (H) 11.4 - 15.2 seconds   INR 2.47   APTT     Status: None   Collection Time: 03/25/17  5:56 AM  Result Value Ref Range   aPTT 30 24 - 36 seconds  CBC     Status: Abnormal   Collection Time: 03/25/17  5:56 AM  Result Value Ref Range   WBC 6.4 4.0 - 10.5 K/uL   RBC 3.86 (L) 3.87 - 5.11 MIL/uL   Hemoglobin 11.4 (L) 12.0 - 15.0 g/dL   HCT 34.9 (L) 36.0 - 46.0 %   MCV 90.4 78.0 - 100.0 fL   MCH 29.5 26.0 - 34.0 pg   MCHC 32.7 30.0 - 36.0 g/dL   RDW 13.7 11.5 - 15.5 %   Platelets 302 150 - 400 K/uL  Differential     Status: None   Collection Time: 03/25/17  5:56 AM  Result Value Ref Range   Neutrophils Relative % 42 %   Neutro Abs 2.7 1.7 - 7.7 K/uL   Lymphocytes Relative 46 %   Lymphs Abs 2.9 0.7 - 4.0 K/uL   Monocytes Relative 9 %   Monocytes Absolute 0.6 0.1 - 1.0 K/uL   Eosinophils Relative 2 %   Eosinophils Absolute 0.1 0.0  - 0.7 K/uL   Basophils Relative 1 %   Basophils Absolute 0.0 0.0 - 0.1 K/uL  Comprehensive metabolic panel     Status: Abnormal   Collection Time: 03/25/17  5:56 AM  Result Value Ref Range   Sodium 139 135 - 145 mmol/L   Potassium 4.5 3.5 - 5.1 mmol/L   Chloride 102 101 - 111 mmol/L   CO2 26 22 - 32 mmol/L   Glucose, Bld 115 (H) 65 - 99 mg/dL   BUN 38 (H) 6 - 20 mg/dL   Creatinine, Ser 1.39 (H) 0.44 - 1.00 mg/dL   Calcium 9.4 8.9 - 10.3 mg/dL   Total Protein 6.7 6.5 - 8.1 g/dL   Albumin 3.6 3.5 - 5.0 g/dL   AST 23 15 - 41 U/L   ALT 18 14 - 54 U/L   Alkaline Phosphatase 48 38 - 126 U/L   Total Bilirubin 1.1 0.3 - 1.2 mg/dL   GFR calc non Af Amer 36 (L) >60 mL/min   GFR calc Af Amer 42 (L) >60 mL/min    Comment: (NOTE) The eGFR has been calculated using the CKD EPI equation. This calculation has not been validated in all clinical situations. eGFR's persistently <60 mL/min signify possible Chronic Kidney Disease.    Anion gap 11 5 - 15  Troponin I     Status: None   Collection Time: 03/25/17  5:56 AM  Result Value Ref Range   Troponin I <0.03 <0.03 ng/mL  Glucose, capillary     Status: Abnormal   Collection Time: 03/25/17 11:44 AM  Result Value Ref Range   Glucose-Capillary 138 (H) 65 - 99 mg/dL   Comment 1 Notify RN    Comment 2 Document in Chart   Troponin I (q 6hr x 3)     Status: Abnormal   Collection Time: 03/25/17  1:41 PM  Result Value Ref Range   Troponin I 0.06 (HH) <0.03 ng/mL    Comment: CRITICAL RESULT CALLED TO, READ BACK BY AND VERIFIED WITH: WAKINS,T AT 1430 ON 6.12.2018 BY ISLEY,B   Glucose, capillary     Status: Abnormal   Collection Time: 03/25/17    4:25 PM  Result Value Ref Range   Glucose-Capillary 153 (H) 65 - 99 mg/dL   Comment 1 Notify RN    Comment 2 Document in Chart   Troponin I (q 6hr x 3)     Status: Abnormal   Collection Time: 03/25/17  5:31 PM  Result Value Ref Range   Troponin I 0.07 (HH) <0.03 ng/mL    Comment: CRITICAL VALUE NOTED.   VALUE IS CONSISTENT WITH PREVIOUSLY REPORTED AND CALLED VALUE.  Glucose, capillary     Status: Abnormal   Collection Time: 03/25/17  9:26 PM  Result Value Ref Range   Glucose-Capillary 155 (H) 65 - 99 mg/dL   Comment 1 Notify RN    Comment 2 Document in Chart   Troponin I (q 6hr x 3)     Status: Abnormal   Collection Time: 03/26/17 12:21 AM  Result Value Ref Range   Troponin I 0.08 (HH) <0.03 ng/mL    Comment: CRITICAL VALUE NOTED.  VALUE IS CONSISTENT WITH PREVIOUSLY REPORTED AND CALLED VALUE.  Lipid panel     Status: None   Collection Time: 03/26/17  5:49 AM  Result Value Ref Range   Cholesterol 149 0 - 200 mg/dL   Triglycerides 98 <150 mg/dL   HDL 50 >40 mg/dL   Total CHOL/HDL Ratio 3.0 RATIO   VLDL 20 0 - 40 mg/dL   LDL Cholesterol 79 0 - 99 mg/dL    Comment:        Total Cholesterol/HDL:CHD Risk Coronary Heart Disease Risk Table                     Men   Women  1/2 Average Risk   3.4   3.3  Average Risk       5.0   4.4  2 X Average Risk   9.6   7.1  3 X Average Risk  23.4   11.0        Use the calculated Patient Ratio above and the CHD Risk Table to determine the patient's CHD Risk.        ATP III CLASSIFICATION (LDL):  <100     mg/dL   Optimal  100-129  mg/dL   Near or Above                    Optimal  130-159  mg/dL   Borderline  160-189  mg/dL   High  >190     mg/dL   Very High   Protime-INR     Status: Abnormal   Collection Time: 03/26/17  5:52 AM  Result Value Ref Range   Prothrombin Time 26.9 (H) 11.4 - 15.2 seconds   INR 2.43     ABGS No results for input(s): PHART, PO2ART, TCO2, HCO3 in the last 72 hours.  Invalid input(s): PCO2 CULTURES No results found for this or any previous visit (from the past 240 hour(s)). Studies/Results: Ct Head Wo Contrast  Result Date: 03/25/2017 CLINICAL DATA:  LEFT arm pain this morning, with weakness and numbness. Assess for stroke. History of diabetes, hypertension. EXAM: CT HEAD WITHOUT CONTRAST TECHNIQUE:  Contiguous axial images were obtained from the base of the skull through the vertex without intravenous contrast. COMPARISON:  CT HEAD September 23, 2015 and MRI of the head October 12, 2015 FINDINGS: BRAIN: No intraparenchymal hemorrhage, mass effect nor midline shift. The ventricles and sulci are normal for age. Patchy supratentorial white matter hypodensities less than expected for patient's age,   though non-specific are most compatible with chronic small vessel ischemic disease. Age indeterminate faint bilateral basal ganglia lacunar infarcts. No acute large vascular territory infarcts. No abnormal extra-axial fluid collections. Basal cisterns are patent. VASCULAR: Severe calcific atherosclerosis of the carotid siphons and intradural vertebral artery's. SKULL: No skull fracture. No significant scalp soft tissue swelling. SINUSES/ORBITS: The mastoid air-cells and included paranasal sinuses are well-aerated.The included ocular globes and orbital contents are non-suspicious. Status post bilateral ocular lens implants. OTHER: None. IMPRESSION: Age indeterminate basal ganglia lacunar infarcts. Severe atherosclerosis. Critical Value/emergent results were called by telephone at the time of interpretation on 03/25/2017 at 6:20 am to Dr. DAVID GLICK , who verbally acknowledged these results. Electronically Signed   By: Courtnay  Bloomer M.D.   On: 03/25/2017 06:24   Mr Brain Wo Contrast  Result Date: 03/25/2017 CLINICAL DATA:  Left arm weakness EXAM: MRI HEAD WITHOUT CONTRAST MRA HEAD WITHOUT CONTRAST TECHNIQUE: Multiplanar, multiecho pulse sequences of the brain and surrounding structures were obtained without intravenous contrast. Angiographic images of the head were obtained using MRA technique without contrast. COMPARISON:  Head CT 03/25/2017 Brain MRI 10/11/2014 FINDINGS: MRI HEAD FINDINGS Brain: The midline structures are normal. No focal diffusion restriction to indicate acute infarct. No intraparenchymal  hemorrhage. There is multifocal hyperintense T2-weighted signal within the periventricular white matter, most often seen in the setting of chronic microvascular ischemia. No mass lesion. Single focus of chronic microhemorrhage in the left periatrial white matter. No hydrocephalus, age advanced atrophy or lobar predominant volume loss. No dural abnormality or extra-axial collection. Skull and upper cervical spine: The visualized skull base, calvarium, upper cervical spine and extracranial soft tissues are normal. Sinuses/Orbits: No fluid levels or advanced mucosal thickening. No mastoid effusion. Normal orbits. MRA HEAD FINDINGS Intracranial internal carotid arteries: Normal. Anterior cerebral arteries: Normal. Middle cerebral arteries: Normal. Posterior communicating arteries: Present bilaterally. Posterior cerebral arteries: Normal. Basilar artery: Normal. Vertebral arteries: Right-dominant. The nondominant left vertebral artery terminates in PICA. Superior cerebellar arteries: Normal. Anterior inferior cerebellar arteries: Normal. Posterior inferior cerebellar arteries: Normal on the right. Left PICA is the terminus of the left vertebral artery. IMPRESSION: 1. No acute intracranial abnormality. 2. Nonspecific multifocal white matter hyperintensities, most commonly indicating chronic microvascular ischemia. 3. Normal intracranial MRA. 1. Electronically Signed   By: Kevin  Herman M.D.   On: 03/25/2017 13:59   Us Carotid Bilateral (at [facility] And [facility] Only)  Result Date: 03/25/2017 CLINICAL DATA:  Left arm weakness for 1 day EXAM: BILATERAL CAROTID DUPLEX ULTRASOUND TECHNIQUE: Gray scale imaging, color Doppler and duplex ultrasound were performed of bilateral carotid and vertebral arteries in the neck. COMPARISON:  03/04/2013 FINDINGS: Criteria: Quantification of carotid stenosis is based on velocity parameters that correlate the residual internal carotid diameter with NASCET-based stenosis levels, using the diameter  of the distal internal carotid lumen as the denominator for stenosis measurement. The following velocity measurements were obtained: RIGHT ICA:  155 cm/sec CCA:  106 cm/sec SYSTOLIC ICA/CCA RATIO:  1.5 DIASTOLIC ICA/CCA RATIO:  1.6 ECA:  136 cm/sec LEFT ICA:  266 cm/sec CCA:  98 cm/sec SYSTOLIC ICA/CCA RATIO:  2.7 DIASTOLIC ICA/CCA RATIO:  2.6 ECA:  176 cm/sec RIGHT CAROTID ARTERY: There is smooth mixed plaque in the upper common carotid. There is also irregular ulcerated plaque in the bulb. Low resistance internal carotid Doppler pattern is preserved. Irregular mild plaque does extend into the lower internal carotid artery. RIGHT VERTEBRAL ARTERY:  Antegrade. LEFT CAROTID ARTERY: There is moderate irregular calcified plaque in the bulb.   Ulceration of the plaque is suggested. Low resistance internal carotid Doppler pattern is preserved. LEFT VERTEBRAL ARTERY:  Antegrade. IMPRESSION: There is 50-69% stenosis in the right internal carotid artery. Greater than 70% stenosis in the left internal carotid artery. These findings have progressed since the prior study. Electronically Signed   By: Arthur  Hoss M.D.   On: 03/25/2017 13:09   Mr Mra Headm  Result Date: 03/25/2017 CLINICAL DATA:  Left arm weakness EXAM: MRI HEAD WITHOUT CONTRAST MRA HEAD WITHOUT CONTRAST TECHNIQUE: Multiplanar, multiecho pulse sequences of the brain and surrounding structures were obtained without intravenous contrast. Angiographic images of the head were obtained using MRA technique without contrast. COMPARISON:  Head CT 03/25/2017 Brain MRI 10/11/2014 FINDINGS: MRI HEAD FINDINGS Brain: The midline structures are normal. No focal diffusion restriction to indicate acute infarct. No intraparenchymal hemorrhage. There is multifocal hyperintense T2-weighted signal within the periventricular white matter, most often seen in the setting of chronic microvascular ischemia. No mass lesion. Single focus of chronic microhemorrhage in the left periatrial  white matter. No hydrocephalus, age advanced atrophy or lobar predominant volume loss. No dural abnormality or extra-axial collection. Skull and upper cervical spine: The visualized skull base, calvarium, upper cervical spine and extracranial soft tissues are normal. Sinuses/Orbits: No fluid levels or advanced mucosal thickening. No mastoid effusion. Normal orbits. MRA HEAD FINDINGS Intracranial internal carotid arteries: Normal. Anterior cerebral arteries: Normal. Middle cerebral arteries: Normal. Posterior communicating arteries: Present bilaterally. Posterior cerebral arteries: Normal. Basilar artery: Normal. Vertebral arteries: Right-dominant. The nondominant left vertebral artery terminates in PICA. Superior cerebellar arteries: Normal. Anterior inferior cerebellar arteries: Normal. Posterior inferior cerebellar arteries: Normal on the right. Left PICA is the terminus of the left vertebral artery. IMPRESSION: 1. No acute intracranial abnormality. 2. Nonspecific multifocal white matter hyperintensities, most commonly indicating chronic microvascular ischemia. 3. Normal intracranial MRA. 1. Electronically Signed   By: Kevin  Herman M.D.   On: 03/25/2017 13:59    Medications: I have reviewed the patient's current medications.  Assesment:  Principal Problem:   Left arm numbness Active Problems:   Atrial flutter (HCC)   Essential hypertension   CAD (coronary artery disease)   Facial droop   Long term (current) use of anticoagulants [Z79.01]   Chest pain   DM type 2 (diabetes mellitus, type 2) (HCC)   CKD (chronic kidney disease), stage III  elvated troponin  carotid artery stenosis (70%)  Plan:  Medications reviewed Continue telemetry Continue anticoagulation Cardiology consult    LOS: 0 days   , 03/26/2017, 8:19 AM  

## 2017-03-26 NOTE — Plan of Care (Signed)
Problem: Education: Goal: Knowledge of disease or condition will improve Outcome: Progressing Pt educated on stroke types, causes, signs and symptoms. Pt has R facial asymmetry as well as numbness in L leg. Pt alert and oriented. No c/o pain. VSS. Will continue to monitor pt

## 2017-03-26 NOTE — Progress Notes (Signed)
[place] for Warfarin Indication: atrial fibrillation  No Known Allergies  Patient Measurements: Height: 5\' 5"  (165.1 cm) Weight: 174 lb 6.4 oz (79.1 kg) IBW/kg (Calculated) : 57  Vital Signs: Temp: 98.5 F (36.9 C) (06/13 0730) Temp Source: Oral (06/13 0730) BP: 125/48 (06/13 0730) Pulse Rate: 72 (06/13 0730)  Labs:  Recent Labs  03/25/17 0556 03/25/17 1341 03/25/17 1731 03/26/17 0021 03/26/17 0552  HGB 11.4*  --   --   --   --   HCT 34.9*  --   --   --   --   PLT 302  --   --   --   --   APTT 30  --   --   --   --   LABPROT 27.2*  --   --   --  26.9*  INR 2.47  --   --   --  2.43  CREATININE 1.39*  --   --   --   --   TROPONINI <0.03 0.06* 0.07* 0.08*  --    Estimated Creatinine Clearance: 35.8 mL/min (A) (by C-G formula based on SCr of 1.39 mg/dL (H)).  Medical History: Past Medical History:  Diagnosis Date  . Atrial flutter ([place])    typical appearing  . Bilateral pulmonary embolism ([place]) 08/2008   On coumadin  . Coronary artery disease    s/p DES to mid RCA in 02/2008; s/p inferior MI with DES to mid RCA and mid LAD in 05/2005  . Coronary atherosclerosis   . Diabetes mellitus without complication ([place])   . Hyperlipidemia   . Hypertension    Controlled  . Ischemic cardiomyopathy   . Myocardial infarction ([place])   . Unspecified combined systolic and diastolic heart failure    Asymptomatic. Echo 04/28/13 EF= 40- 45%  . Unspecified combined systolic and diastolic heart failure     Medications:  Prescriptions Prior to Admission  Medication Sig Dispense Refill Last Dose  . amLODipine (NORVASC) 5 MG tablet Take 5 mg by mouth daily.   03/24/2017 at Unknown time  . aspirin EC 81 MG tablet Take 81 mg by mouth daily.   03/24/2017 at Unknown time  . atorvastatin (LIPITOR) 10 MG tablet Take 10 mg by mouth daily.   03/24/2017 at Unknown time  . enalapril (VASOTEC) 10 MG tablet Take 1 tablet (10 mg total) by mouth 2 (two) times  daily. 180 tablet 0 03/24/2017 at Unknown time  . hydrochlorothiazide (HYDRODIURIL) 25 MG tablet TAKE ONE TABLET BY MOUTH ONCE DAILY **NEEDS AN APPOINTMENT** 15 tablet 0 03/24/2017 at Unknown time  . ibuprofen (ADVIL,MOTRIN) 800 MG tablet Take 800 mg by mouth daily as needed for moderate pain.   unknown  . metFORMIN (GLUCOPHAGE) 500 MG tablet Take 1 tablet (500 mg total) by mouth 2 (two) times daily with a meal. 60 tablet 5 03/24/2017 at Unknown time  . metoprolol (LOPRESSOR) 50 MG tablet Take 50 mg by mouth 2 (two) times daily.   03/24/2017 at 1700  . nitroGLYCERIN (NITROSTAT) 0.4 MG SL tablet Place 0.4 mg under the tongue every 5 (five) minutes as needed for chest pain. 3 tablets maximum   unknown  . Omega-3 Fatty Acids (OMEGA 3 PO) Take 1 capsule by mouth daily. Omega Red Krill   03/24/2017 at Unknown time  . vitamin C (ASCORBIC ACID) 500 MG tablet Take 500 mg by mouth daily.   03/24/2017 at Unknown time  . vitamin E 400 UNIT capsule Take  400 Units by mouth daily.   03/24/2017 at Unknown time  . warfarin (COUMADIN) 5 MG tablet Take 5-7.5 mg by mouth daily at 6 PM. Take 7.5 mg on Monday, Tuesday, Thursday, Friday, Sunday. Take 5 mg on Wednesday and Saturday.   03/24/2017 at 1700   Assessment: Okay for Protocol, INR at goal.  CT and MRI (-) acute bleed  Goal of Therapy:  INR 2-3  Plan:  Warfarin 5mg  PO x 1 Daily PT/INR Monitor for signs and symptoms of bleeding.   [place] Crane, Caroline Longie A 03/26/2017,10:54 AM

## 2017-03-26 NOTE — Care Management Note (Signed)
Case Management Note  Patient Details  Name: [name] MRN: [id number] Date of Birth: 04/30/1941  Subjective/Objective:                  Admitted for r/o CVA. Pt from home, ind with ADL's. She has strong family support. She has PCP, transportation and insurance with drug coverage. PT/OT recommend no f/u, no DME needs. Pt communicates no needs.   Action/Plan: Anticipate DC home with self care. No CM needs.   Expected Discharge Date:     6/142018             Expected Discharge Plan:  Home/Self Care  In-House Referral:  NA  Discharge planning Services  CM Consult  Post Acute Care Choice:  NA Choice offered to:  NA  Status of Service:  Completed, signed off  Sherald Barge, RN 03/26/2017, 10:32 AM

## 2017-03-26 NOTE — Consult Note (Addendum)
Cardiology Consultation:   Patient ID: [name]; [id number]; December 24, 1940   Admit date: 03/25/2017 Date of Consult: 03/26/2017  Primary Care Provider: Sinda Du, MD Primary Cardiologist: Dr. Harrington Challenger Primary Electrophysiologist: Dr. Rayann Heman    Patient Profile:   [name] is a 76 y.o. female with a hx of Atrial flutter status post ablation, CAD, chronic combined CHF who is being seen today for the evaluation of left arm numbness at the request of Dr. Legrand Rams.  History of Present Illness:   [name] with history of CAD status post MI and DES mid RCA in 2009, mid LAD 2006 , ischemic cardiomyopathy EF normalized in 2015 with combined systolic and diastolic CHF intermittent atrial flutter status post ablation by Dr. Rayann Heman in 2014 has been maintained on Coumadin since then(review of notes said to get holter in 2015 and stop coumadin if no aflutter/fib but never done), hypertension, hyperlipidemia.  Patient was admitted yesterday with left arm numbness. CT scan showed lacunar infarct but MRI showed no intracranial abnormality. Carotid Dopplers 50-69% on the right 70% on the left. 2-D echo 03/25/17 normal LVEF 55% with moderate LVH, grade 2 DD, mild AS. Creatinine 1.39 INR 2.43 troponins elevated but flat 0.06, 0.07, 0.08.   Patient tells me her numbness which has resolved is coming from a pinched nerve. Denies any cardiac complaints. No chest pain, palpitations, dyspnea, dyspnea on exertion dizziness or presyncope. 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 Has appt with Dr. Harrington Challenger in July.  Past Medical History:  Diagnosis Date  . Atrial flutter ([place])    typical appearing  . Bilateral pulmonary embolism ([place]) 08/2008   On coumadin  . Coronary artery disease    s/p DES to mid RCA in 02/2008; s/p inferior MI with DES to mid RCA and mid LAD in 05/2005  . Coronary atherosclerosis   . Diabetes mellitus without  complication ([place])   . Hyperlipidemia   . Hypertension    Controlled  . Ischemic cardiomyopathy   . Myocardial infarction ([place])   . Unspecified combined systolic and diastolic heart failure    Asymptomatic. Echo 04/28/13 EF= 40- 45%  . Unspecified combined systolic and diastolic heart failure     Past Surgical History:  Procedure Laterality Date  . ATRIAL FLUTTER ABLATION N/A 06/17/2013   Procedure: ATRIAL FLUTTER ABLATION;  Surgeon: Thompson Grayer, MD;  Location: [facility] CATH LAB;  Service: Cardiovascular;  Laterality: N/A;  . CHOLECYSTECTOMY    . HIP SURGERY       Inpatient Medications: Scheduled Meds: . amLODipine  5 mg Oral Daily  . aspirin EC  81 mg Oral Daily  . atorvastatin  10 mg Oral Daily  . enalapril  10 mg Oral BID  . hydrochlorothiazide  25 mg Oral Daily  . metFORMIN  500 mg Oral BID WC  . metoprolol tartrate  50 mg Oral BID  . omega-3 acid ethyl esters   Oral Daily  . vitamin C  500 mg Oral Daily  . vitamin E  400 Units Oral Daily  . Warfarin - Pharmacist Dosing Inpatient   Does not apply Q24H   Continuous Infusions:  PRN Meds: acetaminophen **OR** acetaminophen, nitroGLYCERIN, ondansetron **OR** ondansetron (ZOFRAN) IV  Allergies:   No Known Allergies  Social History:   Social History   Social History  . Marital status: Married    Spouse name: N/A  . Number of children: 3  .  Years of education: N/A   Occupational History  . Not on file.   Social History Main Topics  . Smoking status: Former Smoker    Years: 10.00    Types: Cigarettes    Quit date: 10/14/1978  . Smokeless tobacco: Never Used  . Alcohol use No  . Drug use: No  . Sexual activity: Not on file   Other Topics Concern  . Not on file   Social History Narrative  . No narrative on file    Family History:   The patient's family history includes Heart failure in her mother.  ROS:  Please see the history of present illness.  Review of Systems  Constitution: Negative.  HENT: Negative.     Eyes: Negative.   Cardiovascular: Negative.   Respiratory: Negative.   Hematologic/Lymphatic: Negative.   Musculoskeletal: Positive for arthritis, joint pain, muscle weakness and neck pain.  Gastrointestinal: Negative.   Genitourinary: Negative.   Neurological: Positive for numbness.     All other ROS reviewed and negative.     Physical Exam/Data:   Vitals:   03/25/17 2100 03/25/17 2300 03/26/17 0300 03/26/17 0730  BP: (!) 143/53 (!) 130/53 (!) 140/54 (!) 125/48  Pulse: 70 68 71 72  Resp: 20 20 20 19   Temp: 98.5 F (36.9 C) 98.3 F (36.8 C) 98.7 F (37.1 C) 98.5 F (36.9 C)  TempSrc: Oral Oral Oral Oral  SpO2: 96% 96% 98% 96%  Weight:      Height:        Intake/Output Summary (Last 24 hours) at 03/26/17 1018 Last data filed at 03/26/17 0938  Gross per 24 hour  Intake              920 ml  Output             1630 ml  Net             -710 ml   Filed Weights   03/25/17 0527 03/25/17 1100  Weight: 170 lb (77.1 kg) 174 lb 6.4 oz (79.1 kg)   Body mass index is 29.02 kg/m.  General:  Well nourished, well developed, in no acute distress  HEENT: normal Lymph: no adenopathy Neck: no JVD Endocrine:  No thryomegaly Vascular: No carotid bruits; FA pulses 2+ bilaterally without bruits  Cardiac:  normal S1, S2; RRR; 2/6 sys murmur LSB Lungs:  clear to auscultation bilaterally, no wheezing, rhonchi or rales  Abd: soft, nontender, no hepatomegaly  Ext: no edema Musculoskeletal:  No deformities, BUE and BLE strength normal and equal Skin: warm and dry  Neuro:  CNs 2-12 intact, no focal abnormalities noted Psych:  Normal affect    EKG:  The EKG was personally reviewed and demonstrates NSR with nonspecific ST flattening a little more prominent in V5-V6 than prior tracings. Poor R wave progression unchanged.  Relevant CV Studies: 2-D echo 6/12/18Study Conclusions   - Left ventricle: The cavity size was normal. There was moderate   concentric hypertrophy. Systolic function  was normal. The   estimated ejection fraction was 55%. Wall motion was normal;   there were no regional wall motion abnormalities. Features are   consistent with a pseudonormal left ventricular filling pattern,   with concomitant abnormal relaxation and increased filling   pressure (grade 2 diastolic dysfunction). Doppler parameters are   consistent with high ventricular filling pressure. - Aortic valve: Moderately calcified annulus. Trileaflet; mildly   thickened, mildly calcified leaflets. There was mild stenosis.   Peak velocity (S): 221  cm/s. Mean gradient (S): 10 mm Hg. Valve   area (VTI): 1.78 cm^2. - Mitral valve: Moderately calcified, moderately to severely   thickened annulus. Normal thickness leaflets . - Left atrium: The atrium was mildly dilated. Volume/bsa, ES   (1-plane Simpson&'s, A4C): 37.5 ml/m^2. - Atrial septum: No defect or patent foramen ovale was identified.   IMPRESSION: There is 50-69% stenosis in the right internal carotid artery.   Greater than 70% stenosis in the left internal carotid artery.   These findings have progressed since the prior study.     Electronically Signed   By: Marybelle Killings M.D.   On: 03/25/2017 13:09   Laboratory Data:  Chemistry Recent Labs Lab 03/25/17 0556  NA 139  K 4.5  CL 102  CO2 26  GLUCOSE 115*  BUN 38*  CREATININE 1.39*  CALCIUM 9.4  GFRNONAA 36*  GFRAA 42*  ANIONGAP 11     Recent Labs Lab 03/25/17 0556  PROT 6.7  ALBUMIN 3.6  AST 23  ALT 18  ALKPHOS 48  BILITOT 1.1   Hematology Recent Labs Lab 03/25/17 0556  WBC 6.4  RBC 3.86*  HGB 11.4*  HCT 34.9*  MCV 90.4  MCH 29.5  MCHC 32.7  RDW 13.7  PLT 302   Cardiac Enzymes Recent Labs Lab 03/25/17 0556 03/25/17 1341 03/25/17 1731 03/26/17 0021  TROPONINI <0.03 0.06* 0.07* 0.08*   No results for input(s): TROPIPOC in the last 168 hours.  BNPNo results for input(s): BNP, PROBNP in the last 168 hours.  DDimer No results for input(s):  DDIMER in the last 168 hours.  Radiology/Studies:  Ct Head Wo Contrast  Result Date: 03/25/2017 CLINICAL DATA:  LEFT arm pain this morning, with weakness and numbness. Assess for stroke. History of diabetes, hypertension. EXAM: CT HEAD WITHOUT CONTRAST TECHNIQUE: Contiguous axial images were obtained from the base of the skull through the vertex without intravenous contrast. COMPARISON:  CT HEAD September 23, 2015 and MRI of the head October 12, 2015 FINDINGS: BRAIN: No intraparenchymal hemorrhage, mass effect nor midline shift. The ventricles and sulci are normal for age. Patchy supratentorial white matter hypodensities less than expected for patient's age, though non-specific are most compatible with chronic small vessel ischemic disease. Age indeterminate faint bilateral basal ganglia lacunar infarcts. No acute large vascular territory infarcts. No abnormal extra-axial fluid collections. Basal cisterns are patent. VASCULAR: Severe calcific atherosclerosis of the carotid siphons and intradural vertebral artery's. SKULL: No skull fracture. No significant scalp soft tissue swelling. SINUSES/ORBITS: The mastoid air-cells and included paranasal sinuses are well-aerated.The included ocular globes and orbital contents are non-suspicious. Status post bilateral ocular lens implants. OTHER: None. IMPRESSION: Age indeterminate basal ganglia lacunar infarcts. Severe atherosclerosis. Critical Value/emergent results were called by telephone at the time of interpretation on 03/25/2017 at 6:20 am to Dr. Delora Fuel , who verbally acknowledged these results. Electronically Signed   By: Elon Alas M.D.   On: 03/25/2017 06:24   Mr Brain Wo Contrast  Result Date: 03/25/2017 CLINICAL DATA:  Left arm weakness EXAM: MRI HEAD WITHOUT CONTRAST MRA HEAD WITHOUT CONTRAST TECHNIQUE: Multiplanar, multiecho pulse sequences of the brain and surrounding structures were obtained without intravenous contrast. Angiographic images of  the head were obtained using MRA technique without contrast. COMPARISON:  Head CT 03/25/2017 Brain MRI 10/11/2014 FINDINGS: MRI HEAD FINDINGS Brain: The midline structures are normal. No focal diffusion restriction to indicate acute infarct. No intraparenchymal hemorrhage. There is multifocal hyperintense T2-weighted signal within the periventricular white matter, most often  seen in the setting of chronic microvascular ischemia. No mass lesion. Single focus of chronic microhemorrhage in the left periatrial white matter. No hydrocephalus, age advanced atrophy or lobar predominant volume loss. No dural abnormality or extra-axial collection. Skull and upper cervical spine: The visualized skull base, calvarium, upper cervical spine and extracranial soft tissues are normal. Sinuses/Orbits: No fluid levels or advanced mucosal thickening. No mastoid effusion. Normal orbits. MRA HEAD FINDINGS Intracranial internal carotid arteries: Normal. Anterior cerebral arteries: Normal. Middle cerebral arteries: Normal. Posterior communicating arteries: Present bilaterally. Posterior cerebral arteries: Normal. Basilar artery: Normal. Vertebral arteries: Right-dominant. The nondominant left vertebral artery terminates in PICA. Superior cerebellar arteries: Normal. Anterior inferior cerebellar arteries: Normal. Posterior inferior cerebellar arteries: Normal on the right. Left PICA is the terminus of the left vertebral artery. IMPRESSION: 1. No acute intracranial abnormality. 2. Nonspecific multifocal white matter hyperintensities, most commonly indicating chronic microvascular ischemia. 3. Normal intracranial MRA. 1. Electronically Signed   By: Ulyses Jarred M.D.   On: 03/25/2017 13:59   US Carotid Bilateral (at [facility] And [facility] Only)  Result Date: 03/25/2017 CLINICAL DATA:  Left arm weakness for 1 day EXAM: BILATERAL CAROTID DUPLEX ULTRASOUND TECHNIQUE: Pearline Cables scale imaging, color Doppler and duplex ultrasound were performed of bilateral  carotid and vertebral arteries in the neck. COMPARISON:  03/04/2013 FINDINGS: Criteria: Quantification of carotid stenosis is based on velocity parameters that correlate the residual internal carotid diameter with NASCET-based stenosis levels, using the diameter of the distal internal carotid lumen as the denominator for stenosis measurement. The following velocity measurements were obtained: RIGHT ICA:  155 cm/sec CCA:  284 cm/sec SYSTOLIC ICA/CCA RATIO:  1.5 DIASTOLIC ICA/CCA RATIO:  1.6 ECA:  136 cm/sec LEFT ICA:  266 cm/sec CCA:  98 cm/sec SYSTOLIC ICA/CCA RATIO:  2.7 DIASTOLIC ICA/CCA RATIO:  2.6 ECA:  176 cm/sec RIGHT CAROTID ARTERY: There is smooth mixed plaque in the upper common carotid. There is also irregular ulcerated plaque in the bulb. Low resistance internal carotid Doppler pattern is preserved. Irregular mild plaque does extend into the lower internal carotid artery. RIGHT VERTEBRAL ARTERY:  Antegrade. LEFT CAROTID ARTERY: There is moderate irregular calcified plaque in the bulb. Ulceration of the plaque is suggested. Low resistance internal carotid Doppler pattern is preserved. LEFT VERTEBRAL ARTERY:  Antegrade. IMPRESSION: There is 50-69% stenosis in the right internal carotid artery. Greater than 70% stenosis in the left internal carotid artery. These findings have progressed since the prior study. Electronically Signed   By: Marybelle Killings M.D.   On: 03/25/2017 13:09   Mr Jodene Nam Headm  Result Date: 03/25/2017 CLINICAL DATA:  Left arm weakness EXAM: MRI HEAD WITHOUT CONTRAST MRA HEAD WITHOUT CONTRAST TECHNIQUE: Multiplanar, multiecho pulse sequences of the brain and surrounding structures were obtained without intravenous contrast. Angiographic images of the head were obtained using MRA technique without contrast. COMPARISON:  Head CT 03/25/2017 Brain MRI 10/11/2014 FINDINGS: MRI HEAD FINDINGS Brain: The midline structures are normal. No focal diffusion restriction to indicate acute infarct. No  intraparenchymal hemorrhage. There is multifocal hyperintense T2-weighted signal within the periventricular white matter, most often seen in the setting of chronic microvascular ischemia. No mass lesion. Single focus of chronic microhemorrhage in the left periatrial white matter. No hydrocephalus, age advanced atrophy or lobar predominant volume loss. No dural abnormality or extra-axial collection. Skull and upper cervical spine: The visualized skull base, calvarium, upper cervical spine and extracranial soft tissues are normal. Sinuses/Orbits: No fluid levels or advanced mucosal thickening. No mastoid effusion. Normal orbits. MRA  HEAD FINDINGS Intracranial internal carotid arteries: Normal. Anterior cerebral arteries: Normal. Middle cerebral arteries: Normal. Posterior communicating arteries: Present bilaterally. Posterior cerebral arteries: Normal. Basilar artery: Normal. Vertebral arteries: Right-dominant. The nondominant left vertebral artery terminates in PICA. Superior cerebellar arteries: Normal. Anterior inferior cerebellar arteries: Normal. Posterior inferior cerebellar arteries: Normal on the right. Left PICA is the terminus of the left vertebral artery. IMPRESSION: 1. No acute intracranial abnormality. 2. Nonspecific multifocal white matter hyperintensities, most commonly indicating chronic microvascular ischemia. 3. Normal intracranial MRA. 1. Electronically Signed   By: Ulyses Jarred M.D.   On: 03/25/2017 13:59    Assessment and Plan:   1. Left arm weakness with CT showing lacunar infarct but MRI no intracranial abnormality but microvascular ischemia. Patient says from pinched nerve but I'm not seeing that on scans. INR therapeutic.   2. History of atrial flutter status post ablation in 2014 has been maintained on Coumadin since. INR 2.43 no recurrence of aflutter/fib that I can see since ablation. Keep f/u with Dr. Harrington Challenger in 04/2017  CAD status post remote stenting of the mid LAD 2006, mid RCA  2009. Without angina.  Ischemic cardiomyopathy LVEF is normalized to 55% with no wall motion abnormality on recent echo yesterday.  History of chronic systolic and diastolic CHF  Carotid stenosis will need referred to vascular surgery  Hypertension controlled  Hyperlipidemia lipid profile excellent yesterday  Renal insufficiency creatinine 1.39  Elevated but flat troponins without symptoms, minor nonspecific EKG changes.  Signed, Ermalinda Barrios, PA-C  03/26/2017 10:18 AM   The patient was seen and examined, and I agree with the history, physical exam, assessment and plan as documented above, with modifications as noted below. I have also personally reviewed all relevant documentation, old records, labs, and both radiographic and cardiovascular studies. I have also independently interpreted old and new ECG's.  76 yr old woman with h/o CAD and prior PCI to RCA and LAD, atrial flutter s/p ablation, and prior h/o systolic heart failure (now has grade 2 diastolic dysfunction only by echo this admission) admitted yesterday with left arm numbness which has since almost entirely resolved. Has som mild tingling/numbness of ring and little finger of left hand.  Denies chest pain altogether. 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 Denies palpitations.  Head CT with old basal ganglia infarcts and MRI without acute abnormalities.  Carotid Dopplers reviewed above with moderate right sided and moderate to severe left sided stenosis.  ECG performed yesterday which I personally interpreted showed sinus rhythm with nonspecific ST-T abnormalities with some sagging in lateral leads (not seen in 06/2015 but with some mild abnormalities in 01/2014).  She is already on ASA, statin, warfarin, and beta blocker.  I do not feel she warrants a stress test at this time given the absence of angina and normal LV systolic function and regional wall  motion.   Can follow up as scheduled with Dr. Harrington Challenger. Needs vascular surgery consult as outpatient.  Called daughter to update her on patient.  Kate Sable, MD, [facility]  03/26/2017 11:15 AM

## 2017-03-27 DIAGNOSIS — G459 Transient cerebral ischemic attack, unspecified: Secondary | ICD-10-CM | POA: Diagnosis not present

## 2017-03-27 DIAGNOSIS — I4891 Unspecified atrial fibrillation: Secondary | ICD-10-CM | POA: Diagnosis not present

## 2017-03-27 DIAGNOSIS — I6529 Occlusion and stenosis of unspecified carotid artery: Secondary | ICD-10-CM | POA: Diagnosis not present

## 2017-03-27 DIAGNOSIS — R200 Anesthesia of skin: Secondary | ICD-10-CM | POA: Diagnosis not present

## 2017-03-27 LAB — HEMOGLOBIN A1C
Hgb A1c MFr Bld: 7.2 % — ABNORMAL HIGH (ref 4.8–5.6)
Mean Plasma Glucose: 160 mg/dL

## 2017-03-27 LAB — PROTIME-INR
INR: 2.12
Prothrombin Time: 24.1 seconds — ABNORMAL HIGH (ref 11.4–15.2)

## 2017-03-27 NOTE — Discharge Summary (Signed)
Physician Discharge Summary  Patient ID: [name] MRN: [id number] DOB/AGE: [birth year] 77 y.o. Primary Care Physician:Hawkins, Percell Miller, MD Admit date: 03/25/2017 Discharge date: 03/27/2017    Discharge Diagnoses:   Principal Problem:   Left arm numbness Active Problems:   Atrial flutter (HCC)   Essential hypertension   CAD (coronary artery disease)   Facial droop   Long term (current) use of anticoagulants [Z79.01]   Chest pain   DM type 2 (diabetes mellitus, type 2) (HCC)   CKD (chronic kidney disease), stage III   Allergies as of 03/27/2017   No Known Allergies     Medication List    STOP taking these medications   ibuprofen 800 MG tablet Commonly known as:  ADVIL,MOTRIN     TAKE these medications   amLODipine 5 MG tablet Commonly known as:  NORVASC Take 5 mg by mouth daily.   aspirin EC 81 MG tablet Take 81 mg by mouth daily.   atorvastatin 10 MG tablet Commonly known as:  LIPITOR Take 10 mg by mouth daily.   enalapril 10 MG tablet Commonly known as:  VASOTEC Take 1 tablet (10 mg total) by mouth 2 (two) times daily.   hydrochlorothiazide 25 MG tablet Commonly known as:  HYDRODIURIL TAKE ONE TABLET BY MOUTH ONCE DAILY **NEEDS AN APPOINTMENT**   metFORMIN 500 MG tablet Commonly known as:  GLUCOPHAGE Take 1 tablet (500 mg total) by mouth 2 (two) times daily with a meal.   metoprolol tartrate 50 MG tablet Commonly known as:  LOPRESSOR Take 50 mg by mouth 2 (two) times daily.   nitroGLYCERIN 0.4 MG SL tablet Commonly known as:  NITROSTAT Place 0.4 mg under the tongue every 5 (five) minutes as needed for chest pain. 3 tablets maximum   OMEGA 3 PO Take 1 capsule by mouth daily. Omega Red Krill   vitamin C 500 MG tablet Commonly known as:  ASCORBIC ACID Take 500 mg by mouth daily.   vitamin E 400 UNIT capsule Take 400 Units by mouth daily.   warfarin 5 MG tablet Commonly known as:  COUMADIN Take 5-7.5 mg by mouth daily at 6 PM. Take 7.5 mg  on Monday, Tuesday, Thursday, Friday, Sunday. Take 5 mg on Wednesday and Saturday.       Discharged Condition: improved    Consults: cardiology  Significant Diagnostic Studies: Ct Head Wo Contrast  Result Date: 03/25/2017 CLINICAL DATA:  LEFT arm pain this morning, with weakness and numbness. Assess for stroke. History of diabetes, hypertension. EXAM: CT HEAD WITHOUT CONTRAST TECHNIQUE: Contiguous axial images were obtained from the base of the skull through the vertex without intravenous contrast. COMPARISON:  CT HEAD September 23, 2015 and MRI of the head October 12, 2015 FINDINGS: BRAIN: No intraparenchymal hemorrhage, mass effect nor midline shift. The ventricles and sulci are normal for age. Patchy supratentorial white matter hypodensities less than expected for patient's age, though non-specific are most compatible with chronic small vessel ischemic disease. Age indeterminate faint bilateral basal ganglia lacunar infarcts. No acute large vascular territory infarcts. No abnormal extra-axial fluid collections. Basal cisterns are patent. VASCULAR: Severe calcific atherosclerosis of the carotid siphons and intradural vertebral artery's. SKULL: No skull fracture. No significant scalp soft tissue swelling. SINUSES/ORBITS: The mastoid air-cells and included paranasal sinuses are well-aerated.The included ocular globes and orbital contents are non-suspicious. Status post bilateral ocular lens implants. OTHER: None. IMPRESSION: Age indeterminate basal ganglia lacunar infarcts. Severe atherosclerosis. Critical Value/emergent results were called by telephone at the time of interpretation  on 03/25/2017 at 6:20 am to Dr. Delora Fuel , who verbally acknowledged these results. Electronically Signed   By: Elon Alas M.D.   On: 03/25/2017 06:24   Mr Brain Wo Contrast  Result Date: 03/25/2017 CLINICAL DATA:  Left arm weakness EXAM: MRI HEAD WITHOUT CONTRAST MRA HEAD WITHOUT CONTRAST TECHNIQUE:  Multiplanar, multiecho pulse sequences of the brain and surrounding structures were obtained without intravenous contrast. Angiographic images of the head were obtained using MRA technique without contrast. COMPARISON:  Head CT 03/25/2017 Brain MRI 10/11/2014 FINDINGS: MRI HEAD FINDINGS Brain: The midline structures are normal. No focal diffusion restriction to indicate acute infarct. No intraparenchymal hemorrhage. There is multifocal hyperintense T2-weighted signal within the periventricular white matter, most often seen in the setting of chronic microvascular ischemia. No mass lesion. Single focus of chronic microhemorrhage in the left periatrial white matter. No hydrocephalus, age advanced atrophy or lobar predominant volume loss. No dural abnormality or extra-axial collection. Skull and upper cervical spine: The visualized skull base, calvarium, upper cervical spine and extracranial soft tissues are normal. Sinuses/Orbits: No fluid levels or advanced mucosal thickening. No mastoid effusion. Normal orbits. MRA HEAD FINDINGS Intracranial internal carotid arteries: Normal. Anterior cerebral arteries: Normal. Middle cerebral arteries: Normal. Posterior communicating arteries: Present bilaterally. Posterior cerebral arteries: Normal. Basilar artery: Normal. Vertebral arteries: Right-dominant. The nondominant left vertebral artery terminates in PICA. Superior cerebellar arteries: Normal. Anterior inferior cerebellar arteries: Normal. Posterior inferior cerebellar arteries: Normal on the right. Left PICA is the terminus of the left vertebral artery. IMPRESSION: 1. No acute intracranial abnormality. 2. Nonspecific multifocal white matter hyperintensities, most commonly indicating chronic microvascular ischemia. 3. Normal intracranial MRA. 1. Electronically Signed   By: Ulyses Jarred M.D.   On: 03/25/2017 13:59   US Carotid Bilateral (at [facility] Only)  Result Date: 03/25/2017 CLINICAL DATA:  Left arm weakness  for 1 day EXAM: BILATERAL CAROTID DUPLEX ULTRASOUND TECHNIQUE: Pearline Cables scale imaging, color Doppler and duplex ultrasound were performed of bilateral carotid and vertebral arteries in the neck. COMPARISON:  03/04/2013 FINDINGS: Criteria: Quantification of carotid stenosis is based on velocity parameters that correlate the residual internal carotid diameter with NASCET-based stenosis levels, using the diameter of the distal internal carotid lumen as the denominator for stenosis measurement. The following velocity measurements were obtained: RIGHT ICA:  155 cm/sec CCA:  408 cm/sec SYSTOLIC ICA/CCA RATIO:  1.5 DIASTOLIC ICA/CCA RATIO:  1.6 ECA:  136 cm/sec LEFT ICA:  266 cm/sec CCA:  98 cm/sec SYSTOLIC ICA/CCA RATIO:  2.7 DIASTOLIC ICA/CCA RATIO:  2.6 ECA:  176 cm/sec RIGHT CAROTID ARTERY: There is smooth mixed plaque in the upper common carotid. There is also irregular ulcerated plaque in the bulb. Low resistance internal carotid Doppler pattern is preserved. Irregular mild plaque does extend into the lower internal carotid artery. RIGHT VERTEBRAL ARTERY:  Antegrade. LEFT CAROTID ARTERY: There is moderate irregular calcified plaque in the bulb. Ulceration of the plaque is suggested. Low resistance internal carotid Doppler pattern is preserved. LEFT VERTEBRAL ARTERY:  Antegrade. IMPRESSION: There is 50-69% stenosis in the right internal carotid artery. Greater than 70% stenosis in the left internal carotid artery. These findings have progressed since the prior study. Electronically Signed   By: Marybelle Killings M.D.   On: 03/25/2017 13:09   Mr Jodene Nam Headm  Result Date: 03/25/2017 CLINICAL DATA:  Left arm weakness EXAM: MRI HEAD WITHOUT CONTRAST MRA HEAD WITHOUT CONTRAST TECHNIQUE: Multiplanar, multiecho pulse sequences of the brain and surrounding structures were obtained without intravenous contrast. Angiographic  images of the head were obtained using MRA technique without contrast. COMPARISON:  Head CT 03/25/2017 Brain  MRI 10/11/2014 FINDINGS: MRI HEAD FINDINGS Brain: The midline structures are normal. No focal diffusion restriction to indicate acute infarct. No intraparenchymal hemorrhage. There is multifocal hyperintense T2-weighted signal within the periventricular white matter, most often seen in the setting of chronic microvascular ischemia. No mass lesion. Single focus of chronic microhemorrhage in the left periatrial white matter. No hydrocephalus, age advanced atrophy or lobar predominant volume loss. No dural abnormality or extra-axial collection. Skull and upper cervical spine: The visualized skull base, calvarium, upper cervical spine and extracranial soft tissues are normal. Sinuses/Orbits: No fluid levels or advanced mucosal thickening. No mastoid effusion. Normal orbits. MRA HEAD FINDINGS Intracranial internal carotid arteries: Normal. Anterior cerebral arteries: Normal. Middle cerebral arteries: Normal. Posterior communicating arteries: Present bilaterally. Posterior cerebral arteries: Normal. Basilar artery: Normal. Vertebral arteries: Right-dominant. The nondominant left vertebral artery terminates in PICA. Superior cerebellar arteries: Normal. Anterior inferior cerebellar arteries: Normal. Posterior inferior cerebellar arteries: Normal on the right. Left PICA is the terminus of the left vertebral artery. IMPRESSION: 1. No acute intracranial abnormality. 2. Nonspecific multifocal white matter hyperintensities, most commonly indicating chronic microvascular ischemia. 3. Normal intracranial MRA. 1. Electronically Signed   By: Ulyses Jarred M.D.   On: 03/25/2017 13:59    Lab Results: Basic Metabolic Panel:  Recent Labs  03/25/17 0556  NA 139  K 4.5  CL 102  CO2 26  GLUCOSE 115*  BUN 38*  CREATININE 1.39*  CALCIUM 9.4   Liver Function Tests:  Recent Labs  03/25/17 0556  AST 23  ALT 18  ALKPHOS 48  BILITOT 1.1  PROT 6.7  ALBUMIN 3.6     CBC:  Recent Labs  03/25/17 0556  WBC 6.4   NEUTROABS 2.7  HGB 11.4*  HCT 34.9*  MCV 90.4  PLT 302    No results found for this or any previous visit (from the past 240 hour(s)).   Hospital Course:   This is a 76 years old female with history of multiple medical illnesses who was admitted due to left arm numbness . Her Ct Scan of the head was reported as showing basal ganglia lacunar infarct of age indeterminate. However, her MRI showed no intracranial abnormality. Her carotid artery doppler showed 50-69% stenosis of the right internal carotid artery and greater 70% stenosis of the left internal carotid. Her troponin was slightly elevated and patient was evaluated by cardiology. Echo was  Done normal LV function with EF of 55%. Cardiology recommeded to continue current treatment including her anticoagulation. Advised to have vascular surgery consult in outpatient. Patient is being discharged in stable condition.  Her symptom has subsided.  Discharge Exam: Blood pressure (!) 167/68, pulse 79, temperature 99.7 F (37.6 C), temperature source Oral, resp. rate 20, height 5\' 5"  (1.651 m), weight 103.8 kg (228 lb 12.8 oz), SpO2 97 %.    Disposition:  Home.    Follow-up Information    Sinda Du, MD Follow up in 2 week(s).   Specialty:  Pulmonary Disease Contact information: [place] [address] [phone]           Signed: Aveer Bartow   03/27/2017, 8:25 AM

## 2017-03-31 DIAGNOSIS — E1121 Type 2 diabetes mellitus with diabetic nephropathy: Secondary | ICD-10-CM | POA: Diagnosis not present

## 2017-03-31 DIAGNOSIS — E119 Type 2 diabetes mellitus without complications: Secondary | ICD-10-CM | POA: Diagnosis not present

## 2017-03-31 DIAGNOSIS — I2510 Atherosclerotic heart disease of native coronary artery without angina pectoris: Secondary | ICD-10-CM | POA: Diagnosis not present

## 2017-03-31 DIAGNOSIS — I110 Hypertensive heart disease with heart failure: Secondary | ICD-10-CM | POA: Diagnosis not present

## 2017-04-01 DIAGNOSIS — E1121 Type 2 diabetes mellitus with diabetic nephropathy: Secondary | ICD-10-CM | POA: Diagnosis not present

## 2017-04-01 DIAGNOSIS — I2510 Atherosclerotic heart disease of native coronary artery without angina pectoris: Secondary | ICD-10-CM | POA: Diagnosis not present

## 2017-04-01 DIAGNOSIS — I110 Hypertensive heart disease with heart failure: Secondary | ICD-10-CM | POA: Diagnosis not present

## 2017-04-01 DIAGNOSIS — R413 Other amnesia: Secondary | ICD-10-CM | POA: Diagnosis not present

## 2017-04-01 DIAGNOSIS — E119 Type 2 diabetes mellitus without complications: Secondary | ICD-10-CM | POA: Diagnosis not present

## 2017-04-02 ENCOUNTER — Ambulatory Visit (INDEPENDENT_AMBULATORY_CARE_PROVIDER_SITE_OTHER): Payer: Medicare Other | Admitting: *Deleted

## 2017-04-02 DIAGNOSIS — Z7901 Long term (current) use of anticoagulants: Secondary | ICD-10-CM | POA: Diagnosis not present

## 2017-04-02 DIAGNOSIS — I4892 Unspecified atrial flutter: Secondary | ICD-10-CM | POA: Diagnosis not present

## 2017-04-02 LAB — POCT INR: INR: 2.4

## 2017-04-03 DIAGNOSIS — M5412 Radiculopathy, cervical region: Secondary | ICD-10-CM | POA: Diagnosis not present

## 2017-04-03 DIAGNOSIS — M9901 Segmental and somatic dysfunction of cervical region: Secondary | ICD-10-CM | POA: Diagnosis not present

## 2017-04-04 DIAGNOSIS — M5412 Radiculopathy, cervical region: Secondary | ICD-10-CM | POA: Diagnosis not present

## 2017-04-04 DIAGNOSIS — M9901 Segmental and somatic dysfunction of cervical region: Secondary | ICD-10-CM | POA: Diagnosis not present

## 2017-04-07 DIAGNOSIS — M9901 Segmental and somatic dysfunction of cervical region: Secondary | ICD-10-CM | POA: Diagnosis not present

## 2017-04-07 DIAGNOSIS — M5412 Radiculopathy, cervical region: Secondary | ICD-10-CM | POA: Diagnosis not present

## 2017-04-08 ENCOUNTER — Other Ambulatory Visit (INDEPENDENT_AMBULATORY_CARE_PROVIDER_SITE_OTHER): Payer: Self-pay | Admitting: Vascular Surgery

## 2017-04-08 ENCOUNTER — Encounter (INDEPENDENT_AMBULATORY_CARE_PROVIDER_SITE_OTHER): Payer: Self-pay

## 2017-04-08 ENCOUNTER — Encounter (INDEPENDENT_AMBULATORY_CARE_PROVIDER_SITE_OTHER): Payer: Self-pay | Admitting: Vascular Surgery

## 2017-04-08 ENCOUNTER — Ambulatory Visit (INDEPENDENT_AMBULATORY_CARE_PROVIDER_SITE_OTHER): Payer: Medicare Other | Admitting: Vascular Surgery

## 2017-04-08 VITALS — BP 160/65 | HR 71 | Resp 16 | Ht 65.0 in | Wt 173.6 lb

## 2017-04-08 DIAGNOSIS — E1122 Type 2 diabetes mellitus with diabetic chronic kidney disease: Secondary | ICD-10-CM

## 2017-04-08 DIAGNOSIS — I4892 Unspecified atrial flutter: Secondary | ICD-10-CM | POA: Diagnosis not present

## 2017-04-08 DIAGNOSIS — I6529 Occlusion and stenosis of unspecified carotid artery: Secondary | ICD-10-CM | POA: Insufficient documentation

## 2017-04-08 DIAGNOSIS — N183 Chronic kidney disease, stage 3 unspecified: Secondary | ICD-10-CM

## 2017-04-08 DIAGNOSIS — I10 Essential (primary) hypertension: Secondary | ICD-10-CM

## 2017-04-08 DIAGNOSIS — I6523 Occlusion and stenosis of bilateral carotid arteries: Secondary | ICD-10-CM

## 2017-04-08 NOTE — Assessment & Plan Note (Addendum)
An MRI of the brain was unrevealing for acute stroke and an MRA of the brain showed no obvious intracranial arterial abnormalities. A carotid duplex however demonstrated significant carotid disease estimated in the 50-69% range on the right and in the greater than 70% range on the left. We had a long discussion today. The differentiation between a 75% greater lesion versus a less than 70% lesion is a very important differentiation area normally a CT scan would be done, but given her kidney function this would carry more risk. Carotid angiogram and she would like to proceed with this. Based off the findings a carotid angiogram, we will determine further evaluation and treatment of her carotid disease. Risks and benefits of the carotid angiogram were discussed and she is agreeable to proceed. Continue current medical regimen for her carotid disease.

## 2017-04-08 NOTE — Assessment & Plan Note (Signed)
This is a major issue and the assessment of her carotid disease. Normally would get a CT scan, but her poor kidney function likely precludes doing this. We can try vigorous hydration and a CT scan versus an angiogram but after discussions the patient has decided to proceed with angiogram.

## 2017-04-08 NOTE — Patient Instructions (Signed)
Carotid Artery Disease The carotid arteries are arteries on both sides of the neck. They carry blood to the brain. Carotid artery disease is when the arteries get smaller (narrow) or get blocked. If these arteries get smaller or get blocked, you are more likely to have a stroke or warning stroke (transient ischemic attack). Follow these instructions at home:  Take medicines as told by your doctor. Make sure you understand all your medicine instructions. Do not stop your medicines without talking to your doctor first.  Follow your doctor's diet instructions. It is important to eat a healthy diet that includes plenty of: ? Fresh fruits. ? Vegetables. ? Lean meats.  Avoid: ? High-fat foods. ? High-sodium foods. ? Foods that are fried, overly processed, or have poor nutritional value.  Stay a healthy weight.  Stay active. Get at least 30 minutes of activity every day.  Do not smoke.  Limit alcohol use to: ? No more than 2 drinks a day for men. ? No more than 1 drink a day for women who are not pregnant.  Do not use illegal drugs.  Keep all doctor visits as told. Get help right away if:  You have sudden weakness or loss of feeling (numbness) on one side of the body, such as the face, arm, or leg.  You have sudden confusion.  You have trouble speaking (aphasia) or understanding.  You have sudden trouble seeing out of one or both eyes.  You have sudden trouble walking.  You have dizziness or feel like you might pass out (faint).  You have a loss of balance or your movements are not steady (uncoordinated).  You have a sudden, severe headache with no known cause.  You have trouble swallowing (dysphagia). Call your local emergency services (911 in [place] Do notdrive yourself to the clinic or hospital. This information is not intended to replace advice given to you by your health care provider. Make sure you discuss any questions you have with your health care  provider. Document Released: 09/16/2012 Document Revised: 03/07/2016 Document Reviewed: 03/31/2013 Elsevier Interactive Patient Education  2018 [employer]  

## 2017-04-08 NOTE — Progress Notes (Signed)
Patient ID: [name], female   DOB: 1941/01/07, 76 y.o.   MRN: [id number]  Chief Complaint  Patient presents with  . New Patient (Initial Visit)    CAD    HPI [name] is a 76 y.o. female.  I am asked to see the patient by Dr. Luan Pulling for evaluation of carotid artery stenosis.  The patient reports TIA type symptoms about 3 weeks ago which prompted an emergency room visit. The symptoms consisted of numbness and weakness in the left arm that lasted for a couple of hours. While she was in the emergency room, multiple tests were done. I then independently reviewed her MRI of the brain and MRA of the brain as well as the carotid ultrasound since that time she has denied any new focal neurologic symptoms. She denies speech or swallowing difficulty. She denies visual symptoms. An MRI of the brain was unrevealing for acute stroke and an MRA of the brain showed no obvious intracranial arterial abnormalities. A carotid duplex however demonstrated significant carotid disease estimated in the 50-69% range on the right and in the greater than 70% range on the left. She is referred for further evaluation and treatment. Prior to this episode, she denied focal neurologic symptoms. She does have multiple atherosclerotic risk factors.   Past Medical History:  Diagnosis Date  . Atrial flutter ([place])    typical appearing  . Bilateral pulmonary embolism ([place]) 08/2008   On coumadin  . Coronary artery disease    s/p DES to mid RCA in 02/2008; s/p inferior MI with DES to mid RCA and mid LAD in 05/2005  . Coronary atherosclerosis   . Diabetes mellitus without complication ([place])   . Hyperlipidemia   . Hypertension    Controlled  . Ischemic cardiomyopathy   . Myocardial infarction ([place])   . Unspecified combined systolic and diastolic heart failure    Asymptomatic. Echo 04/28/13 EF= 40- 45%  . Unspecified combined systolic and diastolic heart failure     Past Surgical History:  Procedure  Laterality Date  . ATRIAL FLUTTER ABLATION N/A 06/17/2013   Procedure: ATRIAL FLUTTER ABLATION;  Surgeon: Thompson Grayer, MD;  Location: [facility] CATH LAB;  Service: Cardiovascular;  Laterality: N/A;  . CHOLECYSTECTOMY    . HIP SURGERY      Family History  Problem Relation Age of Onset  . Heart disease Unknown   . Arthritis Unknown   . Cancer Unknown   . Diabetes Unknown   . Heart failure Mother   No bleeding disorders, clotting disorders, or aneurysms  Social History Social History  Substance Use Topics  . Smoking status: Former Smoker    Years: 10.00    Types: Cigarettes    Quit date: 10/14/1978  . Smokeless tobacco: Never Used  . Alcohol use No  No IV drug use  No Known Allergies  Current Outpatient Prescriptions  Medication Sig Dispense Refill  . amLODipine (NORVASC) 5 MG tablet Take 5 mg by mouth daily.    Marland Kitchen aspirin EC 81 MG tablet Take 81 mg by mouth daily.    Marland Kitchen atorvastatin (LIPITOR) 10 MG tablet Take 10 mg by mouth daily.    . enalapril (VASOTEC) 10 MG tablet Take 1 tablet (10 mg total) by mouth 2 (two) times daily. 180 tablet 0  . hydrochlorothiazide (HYDRODIURIL) 25 MG tablet TAKE ONE TABLET BY MOUTH ONCE DAILY **NEEDS AN APPOINTMENT** 15 tablet 0  . metFORMIN (GLUCOPHAGE) 500 MG tablet Take 1 tablet (500 mg total)  by mouth 2 (two) times daily with a meal. 60 tablet 5  . metoprolol (LOPRESSOR) 50 MG tablet Take 50 mg by mouth 2 (two) times daily.    . nitroGLYCERIN (NITROSTAT) 0.4 MG SL tablet Place 0.4 mg under the tongue every 5 (five) minutes as needed for chest pain. 3 tablets maximum    . Omega-3 Fatty Acids (OMEGA 3 PO) Take 1 capsule by mouth daily. Omega Red Krill    . vitamin C (ASCORBIC ACID) 500 MG tablet Take 500 mg by mouth daily.    . vitamin E 400 UNIT capsule Take 400 Units by mouth daily.    Marland Kitchen warfarin (COUMADIN) 5 MG tablet Take 5-7.5 mg by mouth daily at 6 PM. Take 7.5 mg on Monday, Tuesday, Thursday, Friday, Sunday. Take 5 mg on Wednesday and Saturday.      No current facility-administered medications for this visit.       REVIEW OF SYSTEMS (Negative unless checked)  Constitutional: [] Weight loss  [] Fever  [] Chills Cardiac: [] Chest pain   [] Chest pressure   [x] Palpitations   [] Shortness of breath when laying flat   [] Shortness of breath at rest   [x] Shortness of breath with exertion. Vascular:  [] Pain in legs with walking   [] Pain in legs at rest   [] Pain in legs when laying flat   [] Claudication   [] Pain in feet when walking  [] Pain in feet at rest  [] Pain in feet when laying flat   [] History of DVT   [] Phlebitis   [] Swelling in legs   [] Varicose veins   [] Non-healing ulcers Pulmonary:   [] Uses home oxygen   [] Productive cough   [] Hemoptysis   [] Wheeze  [] COPD   [] Asthma Neurologic:  [] Dizziness  [] Blackouts   [] Seizures   [] History of stroke   [x] History of TIA  [] Aphasia   [] Temporary blindness   [] Dysphagia   [] Weakness or numbness in arms   [] Weakness or numbness in legs Musculoskeletal:  [x] Arthritis   [] Joint swelling   [] Joint pain   [] Low back pain Hematologic:  [] Easy bruising  [] Easy bleeding   [] Hypercoagulable state   [] Anemic  [] Hepatitis Gastrointestinal:  [] Blood in stool   [] Vomiting blood  [] Gastroesophageal reflux/heartburn   [] Abdominal pain Genitourinary:  [x] Chronic kidney disease   [] Difficult urination  [] Frequent urination  [] Burning with urination   [] Hematuria Skin:  [] Rashes   [] Ulcers   [] Wounds Psychological:  [] History of anxiety   []  History of major depression.    Physical Exam BP (!) 160/65   Pulse 71   Resp 16   Ht 5' 5"  (1.651 m)   Wt 173 lb 9.6 oz (78.7 kg)   BMI 28.89 kg/m  Gen:  WD/WN, NAD  Head: [place]/AT, No temporalis wasting. Ear/Nose/Throat: Hearing grossly intact, nares w/o erythema or drainage, oropharynx w/o Erythema/Exudate Eyes: Conjunctiva clear, sclera non-icteric  Neck: trachea midline.  No JVD. Bilateral carotid bruits are present Pulmonary:  Good air movement, clear to auscultation  bilaterally.  Cardiac: Irregular Vascular:  Vessel Right Left  Radial Palpable Palpable                          PT Palpable Palpable  DP Palpable Not Palpable   Gastrointestinal: soft, non-tender/non-distended.  Musculoskeletal: M/S 5/5 throughout.  Extremities without ischemic changes.  No deformity or atrophy. Trace lower extremity edema. Neurologic: Sensation grossly intact in extremities.  Symmetrical.  Speech is fluent. Motor exam as listed above. Psychiatric: Judgment intact, Mood & affect  appropriate for pt's clinical situation. Dermatologic: No rashes or ulcers noted.  No cellulitis or open wounds. Lymph : No Cervical, Axillary, or Inguinal lymphadenopathy.   Radiology Ct Head Wo Contrast  Result Date: 03/25/2017 CLINICAL DATA:  LEFT arm pain this morning, with weakness and numbness. Assess for stroke. History of diabetes, hypertension. EXAM: CT HEAD WITHOUT CONTRAST TECHNIQUE: Contiguous axial images were obtained from the base of the skull through the vertex without intravenous contrast. COMPARISON:  CT HEAD September 23, 2015 and MRI of the head October 12, 2015 FINDINGS: BRAIN: No intraparenchymal hemorrhage, mass effect nor midline shift. The ventricles and sulci are normal for age. Patchy supratentorial white matter hypodensities less than expected for patient's age, though non-specific are most compatible with chronic small vessel ischemic disease. Age indeterminate faint bilateral basal ganglia lacunar infarcts. No acute large vascular territory infarcts. No abnormal extra-axial fluid collections. Basal cisterns are patent. VASCULAR: Severe calcific atherosclerosis of the carotid siphons and intradural vertebral artery's. SKULL: No skull fracture. No significant scalp soft tissue swelling. SINUSES/ORBITS: The mastoid air-cells and included paranasal sinuses are well-aerated.The included ocular globes and orbital contents are non-suspicious. Status post bilateral ocular lens  implants. OTHER: None. IMPRESSION: Age indeterminate basal ganglia lacunar infarcts. Severe atherosclerosis. Critical Value/emergent results were called by telephone at the time of interpretation on 03/25/2017 at 6:20 am to Dr. Delora Fuel , who verbally acknowledged these results. Electronically Signed   By: Elon Alas M.D.   On: 03/25/2017 06:24   Mr Brain Wo Contrast  Result Date: 03/25/2017 CLINICAL DATA:  Left arm weakness EXAM: MRI HEAD WITHOUT CONTRAST MRA HEAD WITHOUT CONTRAST TECHNIQUE: Multiplanar, multiecho pulse sequences of the brain and surrounding structures were obtained without intravenous contrast. Angiographic images of the head were obtained using MRA technique without contrast. COMPARISON:  Head CT 03/25/2017 Brain MRI 10/11/2014 FINDINGS: MRI HEAD FINDINGS Brain: The midline structures are normal. No focal diffusion restriction to indicate acute infarct. No intraparenchymal hemorrhage. There is multifocal hyperintense T2-weighted signal within the periventricular white matter, most often seen in the setting of chronic microvascular ischemia. No mass lesion. Single focus of chronic microhemorrhage in the left periatrial white matter. No hydrocephalus, age advanced atrophy or lobar predominant volume loss. No dural abnormality or extra-axial collection. Skull and upper cervical spine: The visualized skull base, calvarium, upper cervical spine and extracranial soft tissues are normal. Sinuses/Orbits: No fluid levels or advanced mucosal thickening. No mastoid effusion. Normal orbits. MRA HEAD FINDINGS Intracranial internal carotid arteries: Normal. Anterior cerebral arteries: Normal. Middle cerebral arteries: Normal. Posterior communicating arteries: Present bilaterally. Posterior cerebral arteries: Normal. Basilar artery: Normal. Vertebral arteries: Right-dominant. The nondominant left vertebral artery terminates in PICA. Superior cerebellar arteries: Normal. Anterior inferior cerebellar  arteries: Normal. Posterior inferior cerebellar arteries: Normal on the right. Left PICA is the terminus of the left vertebral artery. IMPRESSION: 1. No acute intracranial abnormality. 2. Nonspecific multifocal white matter hyperintensities, most commonly indicating chronic microvascular ischemia. 3. Normal intracranial MRA. 1. Electronically Signed   By: Ulyses Jarred M.D.   On: 03/25/2017 13:59   US Carotid Bilateral (at [facility] And [facility] Only)  Result Date: 03/25/2017 CLINICAL DATA:  Left arm weakness for 1 day EXAM: BILATERAL CAROTID DUPLEX ULTRASOUND TECHNIQUE: Pearline Cables scale imaging, color Doppler and duplex ultrasound were performed of bilateral carotid and vertebral arteries in the neck. COMPARISON:  03/04/2013 FINDINGS: Criteria: Quantification of carotid stenosis is based on velocity parameters that correlate the residual internal carotid diameter with NASCET-based stenosis levels, using the diameter  of the distal internal carotid lumen as the denominator for stenosis measurement. The following velocity measurements were obtained: RIGHT ICA:  155 cm/sec CCA:  245 cm/sec SYSTOLIC ICA/CCA RATIO:  1.5 DIASTOLIC ICA/CCA RATIO:  1.6 ECA:  136 cm/sec LEFT ICA:  266 cm/sec CCA:  98 cm/sec SYSTOLIC ICA/CCA RATIO:  2.7 DIASTOLIC ICA/CCA RATIO:  2.6 ECA:  176 cm/sec RIGHT CAROTID ARTERY: There is smooth mixed plaque in the upper common carotid. There is also irregular ulcerated plaque in the bulb. Low resistance internal carotid Doppler pattern is preserved. Irregular mild plaque does extend into the lower internal carotid artery. RIGHT VERTEBRAL ARTERY:  Antegrade. LEFT CAROTID ARTERY: There is moderate irregular calcified plaque in the bulb. Ulceration of the plaque is suggested. Low resistance internal carotid Doppler pattern is preserved. LEFT VERTEBRAL ARTERY:  Antegrade. IMPRESSION: There is 50-69% stenosis in the right internal carotid artery. Greater than 70% stenosis in the left internal carotid artery. These  findings have progressed since the prior study. Electronically Signed   By: Marybelle Killings M.D.   On: 03/25/2017 13:09   Mr Jodene Nam Headm  Result Date: 03/25/2017 CLINICAL DATA:  Left arm weakness EXAM: MRI HEAD WITHOUT CONTRAST MRA HEAD WITHOUT CONTRAST TECHNIQUE: Multiplanar, multiecho pulse sequences of the brain and surrounding structures were obtained without intravenous contrast. Angiographic images of the head were obtained using MRA technique without contrast. COMPARISON:  Head CT 03/25/2017 Brain MRI 10/11/2014 FINDINGS: MRI HEAD FINDINGS Brain: The midline structures are normal. No focal diffusion restriction to indicate acute infarct. No intraparenchymal hemorrhage. There is multifocal hyperintense T2-weighted signal within the periventricular white matter, most often seen in the setting of chronic microvascular ischemia. No mass lesion. Single focus of chronic microhemorrhage in the left periatrial white matter. No hydrocephalus, age advanced atrophy or lobar predominant volume loss. No dural abnormality or extra-axial collection. Skull and upper cervical spine: The visualized skull base, calvarium, upper cervical spine and extracranial soft tissues are normal. Sinuses/Orbits: No fluid levels or advanced mucosal thickening. No mastoid effusion. Normal orbits. MRA HEAD FINDINGS Intracranial internal carotid arteries: Normal. Anterior cerebral arteries: Normal. Middle cerebral arteries: Normal. Posterior communicating arteries: Present bilaterally. Posterior cerebral arteries: Normal. Basilar artery: Normal. Vertebral arteries: Right-dominant. The nondominant left vertebral artery terminates in PICA. Superior cerebellar arteries: Normal. Anterior inferior cerebellar arteries: Normal. Posterior inferior cerebellar arteries: Normal on the right. Left PICA is the terminus of the left vertebral artery. IMPRESSION: 1. No acute intracranial abnormality. 2. Nonspecific multifocal white matter hyperintensities, most  commonly indicating chronic microvascular ischemia. 3. Normal intracranial MRA. 1. Electronically Signed   By: Ulyses Jarred M.D.   On: 03/25/2017 13:59    Labs Recent Results (from the past 2160 hour(s))  POCT INR     Status: Abnormal   Collection Time: 01/22/17  1:51 PM  Result Value Ref Range   INR 1.1   POCT INR     Status: None   Collection Time: 01/29/17  2:08 PM  Result Value Ref Range   INR 2.3   POCT INR     Status: Normal   Collection Time: 02/17/17  1:18 PM  Result Value Ref Range   INR 2.5   POCT INR     Status: Abnormal   Collection Time: 03/12/17  1:35 PM  Result Value Ref Range   INR 1.9   Urine rapid drug screen (hosp performed)not at [facility]     Status: None   Collection Time: 03/25/17  5:50 AM  Result Value Ref Range  Opiates NONE DETECTED NONE DETECTED   Cocaine NONE DETECTED NONE DETECTED   Benzodiazepines NONE DETECTED NONE DETECTED   Amphetamines NONE DETECTED NONE DETECTED   Tetrahydrocannabinol NONE DETECTED NONE DETECTED   Barbiturates NONE DETECTED NONE DETECTED    Comment:        DRUG SCREEN FOR MEDICAL PURPOSES ONLY.  IF CONFIRMATION IS NEEDED FOR ANY PURPOSE, NOTIFY LAB WITHIN 5 DAYS.        LOWEST DETECTABLE LIMITS FOR URINE DRUG SCREEN Drug Class       Cutoff (ng/mL) Amphetamine      1000 Barbiturate      200 Benzodiazepine   350 Tricyclics       093 Opiates          300 Cocaine          300 THC              50   Urinalysis, Routine w reflex microscopic     Status: Abnormal   Collection Time: 03/25/17  5:50 AM  Result Value Ref Range   Color, Urine STRAW (A) YELLOW   APPearance CLEAR CLEAR   Specific Gravity, Urine 1.010 1.005 - 1.030   pH 7.0 5.0 - 8.0   Glucose, UA NEGATIVE NEGATIVE mg/dL   Hgb urine dipstick SMALL (A) NEGATIVE   Bilirubin Urine NEGATIVE NEGATIVE   Ketones, ur NEGATIVE NEGATIVE mg/dL   Protein, ur NEGATIVE NEGATIVE mg/dL   Nitrite NEGATIVE NEGATIVE   Leukocytes, UA NEGATIVE NEGATIVE   RBC / HPF 0-5 0 - 5  RBC/hpf   WBC, UA 0-5 0 - 5 WBC/hpf   Bacteria, UA NONE SEEN NONE SEEN   Squamous Epithelial / LPF 0-5 (A) NONE SEEN   Hyaline Casts, UA PRESENT   Ethanol     Status: None   Collection Time: 03/25/17  5:56 AM  Result Value Ref Range   Alcohol, Ethyl (B) <5 <5 mg/dL    Comment:        LOWEST DETECTABLE LIMIT FOR SERUM ALCOHOL IS 5 mg/dL FOR MEDICAL PURPOSES ONLY   Protime-INR     Status: Abnormal   Collection Time: 03/25/17  5:56 AM  Result Value Ref Range   Prothrombin Time 27.2 (H) 11.4 - 15.2 seconds   INR 2.47   APTT     Status: None   Collection Time: 03/25/17  5:56 AM  Result Value Ref Range   aPTT 30 24 - 36 seconds  CBC     Status: Abnormal   Collection Time: 03/25/17  5:56 AM  Result Value Ref Range   WBC 6.4 4.0 - 10.5 K/uL   RBC 3.86 (L) 3.87 - 5.11 MIL/uL   Hemoglobin 11.4 (L) 12.0 - 15.0 g/dL   HCT 34.9 (L) 36.0 - 46.0 %   MCV 90.4 78.0 - 100.0 fL   MCH 29.5 26.0 - 34.0 pg   MCHC 32.7 30.0 - 36.0 g/dL   RDW 13.7 11.5 - 15.5 %   Platelets 302 150 - 400 K/uL  Differential     Status: None   Collection Time: 03/25/17  5:56 AM  Result Value Ref Range   Neutrophils Relative % 42 %   Neutro Abs 2.7 1.7 - 7.7 K/uL   Lymphocytes Relative 46 %   Lymphs Abs 2.9 0.7 - 4.0 K/uL   Monocytes Relative 9 %   Monocytes Absolute 0.6 0.1 - 1.0 K/uL   Eosinophils Relative 2 %   Eosinophils Absolute 0.1 0.0 - 0.7 K/uL  Basophils Relative 1 %   Basophils Absolute 0.0 0.0 - 0.1 K/uL  Comprehensive metabolic panel     Status: Abnormal   Collection Time: 03/25/17  5:56 AM  Result Value Ref Range   Sodium 139 135 - 145 mmol/L   Potassium 4.5 3.5 - 5.1 mmol/L   Chloride 102 101 - 111 mmol/L   CO2 26 22 - 32 mmol/L   Glucose, Bld 115 (H) 65 - 99 mg/dL   BUN 38 (H) 6 - 20 mg/dL   Creatinine, Ser 1.39 (H) 0.44 - 1.00 mg/dL   Calcium 9.4 8.9 - 10.3 mg/dL   Total Protein 6.7 6.5 - 8.1 g/dL   Albumin 3.6 3.5 - 5.0 g/dL   AST 23 15 - 41 U/L   ALT 18 14 - 54 U/L   Alkaline  Phosphatase 48 38 - 126 U/L   Total Bilirubin 1.1 0.3 - 1.2 mg/dL   GFR calc non Af Amer 36 (L) >60 mL/min   GFR calc Af Amer 42 (L) >60 mL/min    Comment: (NOTE) The eGFR has been calculated using the CKD EPI equation. This calculation has not been validated in all clinical situations. eGFR's persistently <60 mL/min signify possible Chronic Kidney Disease.    Anion gap 11 5 - 15  Troponin I     Status: None   Collection Time: 03/25/17  5:56 AM  Result Value Ref Range   Troponin I <0.03 <0.03 ng/mL  Hemoglobin A1c     Status: Abnormal   Collection Time: 03/25/17  5:56 AM  Result Value Ref Range   Hgb A1c MFr Bld 7.2 (H) 4.8 - 5.6 %    Comment: (NOTE)         Pre-diabetes: 5.7 - 6.4         Diabetes: >6.4         Glycemic control for adults with diabetes: <7.0    Mean Plasma Glucose 160 mg/dL    Comment: (NOTE) Performed At: [facility] [address] [place], [place] [id number] Lindon Romp MD [id number]   Glucose, capillary     Status: Abnormal   Collection Time: 03/25/17 11:44 AM  Result Value Ref Range   Glucose-Capillary 138 (H) 65 - 99 mg/dL   Comment 1 Notify RN    Comment 2 Document in Chart   Troponin I (q 6hr x 3)     Status: Abnormal   Collection Time: 03/25/17  1:41 PM  Result Value Ref Range   Troponin I 0.06 (HH) <0.03 ng/mL    Comment: CRITICAL RESULT CALLED TO, READ BACK BY AND VERIFIED WITH: WAKINS,T AT 1430 ON 6.12.2018 BY ISLEY,B   ECHOCARDIOGRAM COMPLETE     Status: Abnormal   Collection Time: 03/25/17  3:17 PM  Result Value Ref Range   Weight 2,790.4 oz   Height 65 in   BP 144/54 mmHg   AV vel 1.78    LV PW d 13.6 (A) 0.6 - 1.1 mm   FS 26 (A) 28 - 44 %   LA vol 62.7 mL   LA ID, A-P, ES 45 mm   IVS/LV PW RATIO, ED 1.15    Stroke v 49 ml   LVOT VTI 26.2 cm   LV e' LATERAL 6.64 cm/s   LV E/e' medial 20.33    LV E/e'average 20.33    AV pk vel 221 cm/s   AV Area VTI index .92 cm2/m2   AV Area VTI 1.49 cm2   AV VEL  mean  LVOT/AV .43    AV Area mean vel 1.34 cm2   AV area mean vel ind .7 cm2/m2   LA diam index 2.33 cm/m2   LA vol A4C 72.3 ml   Mean grad 10 mmHg   Valve area 1.78 cm2   LVOT peak grad rest 4 mmHg   E decel time 183 msec   LVOT diameter 20 mm   LVOT area 3.14 cm2   LVOT peak vel 105 cm/s   LVOT peak VTI .57 cm   Ao pk vel .48 m/s   VTI 46.3 cm   LVOT SV 82.00 mL   Peak grad 20 mmHg   Peak grad 7 mmHg   E/e' ratio 20.33    AO mean calculated velocity dopler 143 cm/s   MV pk E vel 135 m/s   MV pk A vel 116 m/s   LV sys vol 48 (A) 14 - 42 mL   LV sys vol index 25.0 mL/m2   LV dias vol 96 46 - 106 mL   LV dias vol index 50.0 mL/m2   LA vol index 32.5 mL/m2   Valve area index .92    AV peak Index .70    MV Dec 183    LA diam end sys 45.00 mm   Simpson's disk 51.00    TDI e' medial 4.90    TDI e' lateral 6.64    Lateral S' vel 21.80 cm/sec   TAPSE 23.40 mm  Glucose, capillary     Status: Abnormal   Collection Time: 03/25/17  4:25 PM  Result Value Ref Range   Glucose-Capillary 153 (H) 65 - 99 mg/dL   Comment 1 Notify RN    Comment 2 Document in Chart   Troponin I (q 6hr x 3)     Status: Abnormal   Collection Time: 03/25/17  5:31 PM  Result Value Ref Range   Troponin I 0.07 (HH) <0.03 ng/mL    Comment: CRITICAL VALUE NOTED.  VALUE IS CONSISTENT WITH PREVIOUSLY REPORTED AND CALLED VALUE.  Glucose, capillary     Status: Abnormal   Collection Time: 03/25/17  9:26 PM  Result Value Ref Range   Glucose-Capillary 155 (H) 65 - 99 mg/dL   Comment 1 Notify RN    Comment 2 Document in Chart   Troponin I (q 6hr x 3)     Status: Abnormal   Collection Time: 03/26/17 12:21 AM  Result Value Ref Range   Troponin I 0.08 (HH) <0.03 ng/mL    Comment: CRITICAL VALUE NOTED.  VALUE IS CONSISTENT WITH PREVIOUSLY REPORTED AND CALLED VALUE.  Lipid panel     Status: None   Collection Time: 03/26/17  5:49 AM  Result Value Ref Range   Cholesterol 149 0 - 200 mg/dL   Triglycerides 98 <150 mg/dL     HDL 50 >40 mg/dL   Total CHOL/HDL Ratio 3.0 RATIO   VLDL 20 0 - 40 mg/dL   LDL Cholesterol 79 0 - 99 mg/dL    Comment:        Total Cholesterol/HDL:CHD Risk Coronary Heart Disease Risk Table                     Men   Women  1/2 Average Risk   3.4   3.3  Average Risk       5.0   4.4  2 X Average Risk   9.6   7.1  3 X Average Risk  23.4  11.0        Use the calculated Patient Ratio above and the CHD Risk Table to determine the patient's CHD Risk.        ATP III CLASSIFICATION (LDL):  <100     mg/dL   Optimal  100-129  mg/dL   Near or Above                    Optimal  130-159  mg/dL   Borderline  160-189  mg/dL   High  >190     mg/dL   Very High   Protime-INR     Status: Abnormal   Collection Time: 03/26/17  5:52 AM  Result Value Ref Range   Prothrombin Time 26.9 (H) 11.4 - 15.2 seconds   INR 2.43   Protime-INR     Status: Abnormal   Collection Time: 03/27/17  5:42 AM  Result Value Ref Range   Prothrombin Time 24.1 (H) 11.4 - 15.2 seconds   INR 2.12   POCT INR     Status: Normal   Collection Time: 04/02/17  1:23 PM  Result Value Ref Range   INR 2.4     Assessment/Plan:  Atrial flutter (HCC) On anticoagulation  Essential hypertension blood pressure control important in reducing the progression of atherosclerotic disease. On appropriate oral medications.   DM type 2 (diabetes mellitus, type 2) (HCC) blood glucose control important in reducing the progression of atherosclerotic disease. Also, involved in wound healing. On appropriate medications.   CKD (chronic kidney disease), stage III This is a major issue and the assessment of her carotid disease. Normally would get a CT scan, but her poor kidney function likely precludes doing this. We can try vigorous hydration and a CT scan versus an angiogram but after discussions the patient has decided to proceed with angiogram.  Carotid artery stenosis An MRI of the brain was unrevealing for acute stroke and an MRA  of the brain showed no obvious intracranial arterial abnormalities. A carotid duplex however demonstrated significant carotid disease estimated in the 50-69% range on the right and in the greater than 70% range on the left. We had a long discussion today. The differentiation between a 75% greater lesion versus a less than 70% lesion is a very important differentiation area normally a CT scan would be done, but given her kidney function this would carry more risk. Carotid angiogram and she would like to proceed with this. Based off the findings a carotid angiogram, we will determine further evaluation and treatment of her carotid disease. Risks and benefits of the carotid angiogram were discussed and she is agreeable to proceed. Continue current medical regimen for her carotid disease.      Leotis Pain 04/08/2017, 3:41 PM   This note was created with Dragon medical transcription system.  Any errors from dictation are unintentional.

## 2017-04-08 NOTE — Assessment & Plan Note (Signed)
blood glucose control important in reducing the progression of atherosclerotic disease. Also, involved in wound healing. On appropriate medications.  

## 2017-04-08 NOTE — Assessment & Plan Note (Signed)
blood pressure control important in reducing the progression of atherosclerotic disease. On appropriate oral medications.  

## 2017-04-08 NOTE — Assessment & Plan Note (Signed)
On anticoagulation 

## 2017-04-09 MED ORDER — CEFAZOLIN SODIUM-DEXTROSE 1-4 GM/50ML-% IV SOLN
1.0000 g | Freq: Once | INTRAVENOUS | Status: AC
  Administered 2017-04-10: 1 g via INTRAVENOUS

## 2017-04-10 ENCOUNTER — Ambulatory Visit
Admission: RE | Admit: 2017-04-10 | Discharge: 2017-04-10 | Disposition: A | Discharge status: Home / Self Care | Payer: Medicare Other | Source: Ambulatory Visit | Attending: Vascular Surgery | Admitting: Vascular Surgery

## 2017-04-10 ENCOUNTER — Encounter: Payer: Self-pay | Admitting: *Deleted

## 2017-04-10 ENCOUNTER — Encounter
Admission: RE | Disposition: A | Discharge status: Home / Self Care | Payer: Self-pay | Source: Ambulatory Visit | Attending: Vascular Surgery

## 2017-04-10 DIAGNOSIS — I429 Cardiomyopathy, unspecified: Secondary | ICD-10-CM | POA: Diagnosis not present

## 2017-04-10 DIAGNOSIS — I252 Old myocardial infarction: Secondary | ICD-10-CM | POA: Diagnosis not present

## 2017-04-10 DIAGNOSIS — Z9889 Other specified postprocedural states: Secondary | ICD-10-CM | POA: Diagnosis not present

## 2017-04-10 DIAGNOSIS — Z7982 Long term (current) use of aspirin: Secondary | ICD-10-CM | POA: Insufficient documentation

## 2017-04-10 DIAGNOSIS — I6523 Occlusion and stenosis of bilateral carotid arteries: Secondary | ICD-10-CM | POA: Insufficient documentation

## 2017-04-10 DIAGNOSIS — Z7984 Long term (current) use of oral hypoglycemic drugs: Secondary | ICD-10-CM | POA: Insufficient documentation

## 2017-04-10 DIAGNOSIS — I2510 Atherosclerotic heart disease of native coronary artery without angina pectoris: Secondary | ICD-10-CM | POA: Insufficient documentation

## 2017-04-10 DIAGNOSIS — Z8261 Family history of arthritis: Secondary | ICD-10-CM | POA: Diagnosis not present

## 2017-04-10 DIAGNOSIS — Z7902 Long term (current) use of antithrombotics/antiplatelets: Secondary | ICD-10-CM | POA: Diagnosis not present

## 2017-04-10 DIAGNOSIS — Z809 Family history of malignant neoplasm, unspecified: Secondary | ICD-10-CM | POA: Insufficient documentation

## 2017-04-10 DIAGNOSIS — I6529 Occlusion and stenosis of unspecified carotid artery: Secondary | ICD-10-CM

## 2017-04-10 DIAGNOSIS — I130 Hypertensive heart and chronic kidney disease with heart failure and stage 1 through stage 4 chronic kidney disease, or unspecified chronic kidney disease: Secondary | ICD-10-CM | POA: Insufficient documentation

## 2017-04-10 DIAGNOSIS — Z833 Family history of diabetes mellitus: Secondary | ICD-10-CM | POA: Diagnosis not present

## 2017-04-10 DIAGNOSIS — I4892 Unspecified atrial flutter: Secondary | ICD-10-CM | POA: Diagnosis not present

## 2017-04-10 DIAGNOSIS — E785 Hyperlipidemia, unspecified: Secondary | ICD-10-CM | POA: Diagnosis not present

## 2017-04-10 DIAGNOSIS — Z87891 Personal history of nicotine dependence: Secondary | ICD-10-CM | POA: Diagnosis not present

## 2017-04-10 DIAGNOSIS — E1122 Type 2 diabetes mellitus with diabetic chronic kidney disease: Secondary | ICD-10-CM | POA: Diagnosis not present

## 2017-04-10 DIAGNOSIS — Z8249 Family history of ischemic heart disease and other diseases of the circulatory system: Secondary | ICD-10-CM | POA: Diagnosis not present

## 2017-04-10 DIAGNOSIS — Z9049 Acquired absence of other specified parts of digestive tract: Secondary | ICD-10-CM | POA: Diagnosis not present

## 2017-04-10 DIAGNOSIS — N183 Chronic kidney disease, stage 3 (moderate): Secondary | ICD-10-CM | POA: Diagnosis not present

## 2017-04-10 DIAGNOSIS — I2699 Other pulmonary embolism without acute cor pulmonale: Secondary | ICD-10-CM | POA: Diagnosis not present

## 2017-04-10 HISTORY — PX: CAROTID PTA/STENT INTERVENTION: CATH118231

## 2017-04-10 LAB — CREATININE, SERUM
Creatinine, Ser: 1.21 mg/dL — ABNORMAL HIGH (ref 0.44–1.00)
GFR calc Af Amer: 49 mL/min — ABNORMAL LOW (ref >60)
GFR calc non Af Amer: 42 mL/min — ABNORMAL LOW (ref >60)

## 2017-04-10 LAB — BUN: BUN: 27 mg/dL — ABNORMAL HIGH (ref 6–20)

## 2017-04-10 LAB — GLUCOSE, CAPILLARY
Glucose-Capillary: 186 mg/dL — ABNORMAL HIGH (ref 65–99)
Glucose-Capillary: 179 mg/dL — ABNORMAL HIGH (ref 65–99)

## 2017-04-10 LAB — PROTIME-INR
INR: 2.57
Prothrombin Time: 28.1 seconds — ABNORMAL HIGH (ref 11.4–15.2)

## 2017-04-10 SURGERY — CAROTID PTA/STENT INTERVENTION
Anesthesia: Moderate Sedation

## 2017-04-10 MED ORDER — FENTANYL CITRATE (PF) 100 MCG/2ML IJ SOLN
INTRAMUSCULAR | Status: DC | PRN
  Administered 2017-04-10: 50 ug via INTRAVENOUS

## 2017-04-10 MED ORDER — SODIUM CHLORIDE 0.9 % IV SOLN: 500.0000 mL | Freq: Once | INTRAVENOUS | Status: DC | PRN

## 2017-04-10 MED ORDER — ONDANSETRON HCL 4 MG/2ML IJ SOLN: 4.0000 mg | Freq: Four times a day (QID) | INTRAMUSCULAR | Status: DC | PRN

## 2017-04-10 MED ORDER — HEPARIN (PORCINE) IN NACL 2-0.9 UNIT/ML-% IJ SOLN
INTRAMUSCULAR | Status: AC
  Filled 2017-04-10: qty 500

## 2017-04-10 MED ORDER — HEPARIN SODIUM (PORCINE) 1000 UNIT/ML IJ SOLN
INTRAMUSCULAR | Status: AC
  Filled 2017-04-10: qty 1

## 2017-04-10 MED ORDER — PHENOL 1.4 % MT LIQD
1.0000 | OROMUCOSAL | Status: DC | PRN
  Filled 2017-04-10: qty 177

## 2017-04-10 MED ORDER — ACETAMINOPHEN 325 MG PO TABS
325.0000 mg | ORAL_TABLET | ORAL | Status: DC | PRN
Start: 2017-04-10 — End: 2017-04-10

## 2017-04-10 MED ORDER — METOPROLOL TARTRATE 5 MG/5ML IV SOLN: 2.0000 mg | INTRAVENOUS | Status: DC | PRN

## 2017-04-10 MED ORDER — HYDROMORPHONE HCL 1 MG/ML IJ SOLN: 1.0000 mg | Freq: Once | INTRAMUSCULAR | Status: DC | PRN

## 2017-04-10 MED ORDER — METHYLPREDNISOLONE SODIUM SUCC 125 MG IJ SOLR: 125.0000 mg | INTRAMUSCULAR | Status: DC | PRN

## 2017-04-10 MED ORDER — LABETALOL HCL 5 MG/ML IV SOLN: 10.0000 mg | INTRAVENOUS | Status: DC | PRN

## 2017-04-10 MED ORDER — ACETAMINOPHEN 325 MG RE SUPP
325.0000 mg | RECTAL | Status: DC | PRN
  Filled 2017-04-10: qty 2

## 2017-04-10 MED ORDER — FAMOTIDINE 20 MG PO TABS: 40.0000 mg | ORAL_TABLET | ORAL | Status: DC | PRN

## 2017-04-10 MED ORDER — MIDAZOLAM HCL 2 MG/2ML IJ SOLN
INTRAMUSCULAR | Status: DC | PRN
Start: 2017-04-10 — End: 2017-04-10
  Administered 2017-04-10 (×2): 1 mg via INTRAVENOUS

## 2017-04-10 MED ORDER — FENTANYL CITRATE (PF) 100 MCG/2ML IJ SOLN
INTRAMUSCULAR | Status: AC
  Filled 2017-04-10: qty 2

## 2017-04-10 MED ORDER — HYDROMORPHONE HCL 1 MG/ML IJ SOLN: 0.5000 mg | INTRAMUSCULAR | Status: DC | PRN

## 2017-04-10 MED ORDER — IOPAMIDOL (ISOVUE-300) INJECTION 61%
INTRAVENOUS | Status: DC | PRN
  Administered 2017-04-10: 45 mL via INTRA_ARTERIAL

## 2017-04-10 MED ORDER — MIDAZOLAM HCL 5 MG/5ML IJ SOLN
INTRAMUSCULAR | Status: AC
  Filled 2017-04-10: qty 5

## 2017-04-10 MED ORDER — OXYCODONE-ACETAMINOPHEN 5-325 MG PO TABS: 1.0000 | ORAL_TABLET | ORAL | Status: DC | PRN

## 2017-04-10 MED ORDER — SODIUM CHLORIDE 0.9 % IV SOLN
INTRAVENOUS | Status: DC
  Administered 2017-04-10: 09:00:00 via INTRAVENOUS

## 2017-04-10 MED ORDER — HEPARIN (PORCINE) IN NACL 2-0.9 UNIT/ML-% IJ SOLN
INTRAMUSCULAR | Status: AC
  Filled 2017-04-10: qty 1000

## 2017-04-10 MED ORDER — LIDOCAINE-EPINEPHRINE (PF) 2 %-1:200000 IJ SOLN
INTRAMUSCULAR | Status: AC
  Filled 2017-04-10: qty 20

## 2017-04-10 MED ORDER — HYDRALAZINE HCL 20 MG/ML IJ SOLN: 5.0000 mg | INTRAMUSCULAR | Status: DC | PRN

## 2017-04-10 MED ORDER — GUAIFENESIN-DM 100-10 MG/5ML PO SYRP
15.0000 mL | ORAL_SOLUTION | ORAL | Status: DC | PRN
  Filled 2017-04-10: qty 15

## 2017-04-10 SURGICAL SUPPLY — 10 items
CATH 5FR JB2 100CM (CATHETERS) ×2 IMPLANT
CATH ANGIO 5F 100CM .035 PIG (CATHETERS) ×2 IMPLANT
CATH H1 100CM (CATHETERS) ×2 IMPLANT
DEVICE PRESTO INFLATION (MISCELLANEOUS) IMPLANT
DEVICE STARCLOSE SE CLOSURE (Vascular Products) ×2 IMPLANT
GLIDEWIRE ANGLED SS 035X260CM (WIRE) ×2 IMPLANT
KIT CAROTID MANIFOLD (MISCELLANEOUS) ×2 IMPLANT
PACK ANGIOGRAPHY (CUSTOM PROCEDURE TRAY) ×2 IMPLANT
SHEATH BRITE TIP 5FRX11 (SHEATH) ×2 IMPLANT
WIRE J 3MM .035X145CM (WIRE) ×2 IMPLANT

## 2017-04-10 NOTE — Op Note (Signed)
[facility]   OPERATIVE NOTE  DATE: 04/10/2017  PRE-OPERATIVE DIAGNOSIS: 1. bilateral carotid artery stenosis 2. Chronic kidney disease precluding CT angiogram  POST-OPERATIVE DIAGNOSIS: Same as above  PROCEDURE: 1.   Ultrasound Guidance for vascular access right femoral artery 2.   Catheter placement into right common carotid artery and into left common carotid artery from right femoral approach 3.   Thoracic aortogram 4.   Cervical and cerebral bilateral carotid angiograms 5.   StarClose closure device right femoral artery  SURGEON: Leotis Pain, MD  ASSISTANT(S): None  ANESTHESIA: Moderate conscious sedation  ESTIMATED BLOOD LOSS: minimal  FLUORO TIME: 3.9  CONTRAST: 45  MODERATE CONSCIOUS SEDATION TIME: Approximately 20 minutes using 2 of Versed and 50 mcg of Fentanyl  FINDING(S): 1.  Very mild right carotid artery stenosis in the 15-20% range in the cervical segment. Intracranial filling was normal without any filling deficits in the anterior or middle cerebral arteries. Mild left carotid artery stenosis in the 30-35% range in the cervical segment. Intracranial filling was normal without any filling deficits in the anterior and middle cerebral arteries.  SPECIMEN(S):  None  INDICATIONS:   Patient is a 76 y.o.female who presents with carotid artery stenosis suggested to be high-grade on the left and at least moderate on the right by the hospital duplex. The patient's renal function precluded CT angiogram. Catheter-based angiogram is performed for further evaluation. Risks and benefits are discussed and informed consent was obtained.  DESCRIPTION: After obtaining full informed written consent, the patient was brought back to the operating room and placed supine upon the vascular suite table.  After obtaining adequate anesthesia, the patient was prepped and draped in the standard fashion.  Moderate conscious sedation was administered during a face to  face encounter with the patient throughout the procedure with my supervision of the RN administering medicines and monitoring the patients vital signs and mental status throughout from the start of the procedure until the patient was taken to the recovery room. The right femoral artery was visualized with ultrasound and found to be calcific but patent. It was then accessed under direct ultrasound guidance without difficulty with a Seldinger needle. A J-wire and 5 French sheath were placed and a permanent image was recorded. A pigtail catheter was placed into the ascending aorta and an LAO projection thoracic aortogram was performed. This showed normal origins of the great vessels without proximal stenosis.  I then selected a headhunter catheter and cannulated the innominate artery advancing into the mid right common carotid artery. Cervical and cerebral carotid angiography were then performed on the right side initially. The intracranial flow was found to be brisk without any filling deficits in the anterior and middle cerebral arteries on the right. The cervical carotid artery was mildly diseased in the 15-20% range although it was moderately calcific. I then turned my attention back to the thoracic aorta and removed the catheter from the right side. I selectively cannulated the left common carotid artery without difficulty with a JB2 catheter and advanced into the mid left common carotid artery. Selective imaging was then performed of the cervical and cerebral carotid artery on the left. Intracranial filling was brisk and normal without any intracranial filling defects in the anterior middle cerebral arteries. The cervical carotid artery was calcific but the degree of stenosis was only in the 30-35% range. Multiple views were taken in the cervical carotid artery bilaterally. At this point, we had imaging to plan our treatment and we elected  to terminate the procedure. The diagnostic catheter was removed. Oblique  arteriogram was performed of the right femoral artery and StarClose closure device was deployed in usual fashion with excellent hemostatic result. The patient tolerated the procedure well and was taken to the recovery room in stable condition.  COMPLICATIONS: None  CONDITION: Stable   Leotis Pain 04/10/2017 9:54 AM   This note was created with Dragon Medical transcription system. Any errors in dictation are purely unintentional.

## 2017-04-10 NOTE — OR Nursing (Signed)
Dr dew notified of lab results 

## 2017-04-10 NOTE — OR Nursing (Signed)
Pt reported falling in driveway yesterday. Bruising noted on face, with ecchymosis left eye. Vascular lab staff to inform Dr Lucky Cowboy.

## 2017-04-10 NOTE — H&P (Signed)
[facility] History & Physical Update  The patient was interviewed and re-examined.  The patient's previous History and Physical has been reviewed and is unchanged.  There is no change in the plan of care. We plan to proceed with the scheduled procedure.  Leotis Pain, MD  04/10/2017, 8:07 AM

## 2017-04-10 NOTE — Discharge Instructions (Signed)
Carotid Angioplasty , Care After Refer to this sheet in the next few weeks. These instructions provide you with information about caring for yourself after your procedure. Your health care provider may also give you more specific instructions. Your treatment has been planned according to current medical practices, but problems sometimes occur. Call your health care provider if you have any problems or questions after your procedure. What can I expect after the procedure? After the procedure, it is common to have: Bruising at the catheter site. This usually fades within 1-2 weeks. A small amount of blood or clear fluid coming from your surgical cut  Blood collecting underneath your skin (hematoma) around the catheter site. This may form a lump that you can see and feel. The lump may be sore and tender. This usually lasts for 1-2 weeks.  Follow these instructions at home:  Activity Return to your normal activities as told by your health care provider. Ask your health care provider what activities are safe for you. Do not lift anything that is heavier than 10 lb (4.5 kg) until your health care provider says that you can do this. Avoid sexual activity until your health care provider says that this is safe for you. Exercise regularly, as told by your health care provider. Ask your health care provider what types of exercise are safe for you. Eating and drinking  Follow instructions from your health care provider about eating or drinking restrictions. Drink enough fluid to keep your urine clear or pale yellow. Eat a heart-healthy diet. This should include plenty of fresh fruits and vegetables. Meat should be lean cuts. Avoid foods that are: High in salt, saturated fat, or sugar. Canned or highly processed. [name]. Lifestyle Limit alcohol intake to no more than 1 drink per day for nonpregnant women and 2 drinks per day for men. One drink equals 12 oz of beer, 5 oz of wine, or 1 oz of hard  liquor. Do not use any tobacco products, such as cigarettes, chewing tobacco, or e-cigarettes. If you need help quitting, ask your health care provider. Work with your health care provider to keep your blood pressure under control. Maintain a healthy weight. General instructions Do not drive or operate heavy machinery while taking prescription pain medicine. Do not take baths, swim, or use a hot tub until your health care provider approves. Tell all health care providers who care for you that you have a stent. Keep all follow-up visits as told by your health care provider. This is important. Contact a health care provider if: You have more redness, swelling, or pain around your incision. You have more fluid or blood coming from your incision. Your incision area feels warm to the touch. You have pus or a bad smell coming from your incision. You have a lump caused by a hematoma that does not go away after 2 weeks. You have a fever. Get help right away if: You have vision changes or loss of vision. You have numbness or weakness on one side of your body. You have difficulty talking. You have slurred speech or you cannot speak (aphasia). You suddenly feel very confused. You notice a hematoma that is quickly getting larger (expanding). You suddenly develop pain in the area where your stent was placed. Your incision begins to bleed and does not stop after you hold pressure on it for 30 minutes. These symptoms may be an emergency. Do not wait to see if the symptoms will go away. Get medical  help right away. Call your local emergency services (911 in the [place] Do not drive yourself to the hospital. This information is not intended to replace advice given to you by your health care provider. Make sure you discuss any questions you have with your health care provider. Document Released: 12/20/2005 Document Revised: 03/07/2016 Document Reviewed: 06/25/2015 Elsevier Interactive Patient Education   [employer].

## 2017-04-28 ENCOUNTER — Encounter: Payer: Self-pay | Admitting: Internal Medicine

## 2017-04-28 ENCOUNTER — Ambulatory Visit (INDEPENDENT_AMBULATORY_CARE_PROVIDER_SITE_OTHER): Payer: Medicare Other | Admitting: Internal Medicine

## 2017-04-28 VITALS — BP 138/60 | HR 76 | Ht 65.0 in | Wt 177.0 lb

## 2017-04-28 DIAGNOSIS — I10 Essential (primary) hypertension: Secondary | ICD-10-CM

## 2017-04-28 DIAGNOSIS — I2510 Atherosclerotic heart disease of native coronary artery without angina pectoris: Secondary | ICD-10-CM | POA: Diagnosis not present

## 2017-04-28 DIAGNOSIS — E782 Mixed hyperlipidemia: Secondary | ICD-10-CM

## 2017-04-28 DIAGNOSIS — I6523 Occlusion and stenosis of bilateral carotid arteries: Secondary | ICD-10-CM | POA: Diagnosis not present

## 2017-04-28 NOTE — Patient Instructions (Signed)
Your physician wants you to follow-up in: 1 year with Dr Theressa Stamps will receive a reminder letter in the mail two months in advance. If you don't receive a letter, please call our office to schedule the follow-up appointment.   Your physician recommends that you continue on your current medications as directed. Please refer to the Current Medication list given to you today.    If you need a refill on your cardiac medications before your next appointment, please call your pharmacy.     No lab work or testing ordered today.       Thank you for choosing [facility] !

## 2017-04-28 NOTE — Progress Notes (Signed)
Cardiology Office Note   Date:  04/28/2017   ID:  [name], DOB 06-10-1941, MRN [id number]  PCP:  Sinda Du, MD  Cardiologist:   Dorris Carnes, MD    Pt presents as f//u of CAD     History of Present Illness: [name] is a 76 y.o. female with a history  CAD (stent in past), intermittatrial flutter, HTN   I ssw her in 2017 Since seen she has donw well from a cardiac standpoint   She denies CP  Breathing is OK  No dizziness       Current Meds  Medication Sig  . amLODipine (NORVASC) 5 MG tablet Take 5 mg by mouth daily.  Marland Kitchen aspirin EC 81 MG tablet Take 81 mg by mouth daily.  Marland Kitchen atorvastatin (LIPITOR) 10 MG tablet Take 10 mg by mouth daily.  . B Complex Vitamins (VITAMIN-B COMPLEX PO) Take 1 tablet by mouth daily.  . enalapril (VASOTEC) 10 MG tablet Take 1 tablet (10 mg total) by mouth 2 (two) times daily.  . hydrochlorothiazide (HYDRODIURIL) 25 MG tablet Take 25 mg by mouth daily.  Marland Kitchen MEGARED OMEGA-3 KRILL OIL PO Take 1 capsule by mouth daily.  . metFORMIN (GLUCOPHAGE) 500 MG tablet Take 1 tablet (500 mg total) by mouth 2 (two) times daily with a meal. (Patient taking differently: Take 1,000 mg by mouth 2 (two) times daily with a meal. )  . metoprolol (LOPRESSOR) 50 MG tablet Take 50 mg by mouth 2 (two) times daily.  . nitroGLYCERIN (NITROSTAT) 0.4 MG SL tablet Place 0.4 mg under the tongue every 5 (five) minutes x 3 doses as needed for chest pain.   . vitamin C (ASCORBIC ACID) 500 MG tablet Take 500 mg by mouth daily.  . vitamin E 400 UNIT capsule Take 400 Units by mouth daily.  Marland Kitchen warfarin (COUMADIN) 5 MG tablet Take 7.5-10 mg by mouth daily at 6 PM. Take 7.5 mg (1.5 tablets) daily with the exception of 10 mg (2 tablets) on Thursdays.  . [DISCONTINUED] hydrochlorothiazide (HYDRODIURIL) 25 MG tablet TAKE ONE TABLET BY MOUTH ONCE DAILY **NEEDS AN APPOINTMENT** (Patient taking differently: TAKE ONE TABLET BY MOUTH ONCE DAILY)     Allergies:   Patient has no known  allergies.   Past Medical History:  Diagnosis Date  . Atrial flutter ([place])    typical appearing  . Bilateral pulmonary embolism ([place]) 08/2008   On coumadin  . Coronary artery disease    s/p DES to mid RCA in 02/2008; s/p inferior MI with DES to mid RCA and mid LAD in 05/2005  . Coronary atherosclerosis   . Diabetes mellitus without complication ([place])   . Hyperlipidemia   . Hypertension    Controlled  . Ischemic cardiomyopathy   . Myocardial infarction ([place])   . Unspecified combined systolic and diastolic heart failure    Asymptomatic. Echo 04/28/13 EF= 40- 45%  . Unspecified combined systolic and diastolic heart failure     Past Surgical History:  Procedure Laterality Date  . ATRIAL FLUTTER ABLATION N/A 06/17/2013   Procedure: ATRIAL FLUTTER ABLATION;  Surgeon: Thompson Grayer, MD;  Location: [facility] CATH LAB;  Service: Cardiovascular;  Laterality: N/A;  . CAROTID PTA/STENT INTERVENTION N/A 04/10/2017   Procedure: Carotid PTA/Stent Intervention;  Surgeon: Algernon Huxley, MD;  Location: [facility] CV [facility];  Service: Cardiovascular;  Laterality: N/A;  . CHOLECYSTECTOMY    . HIP SURGERY       Social History:  The patient  reports that she quit smoking about 38 years ago. Her smoking use included Cigarettes. She quit after 10.00 years of use. She has never used smokeless tobacco. She reports that she does not drink alcohol or use drugs.   Family History:  The patient's family history includes Arthritis in her unknown relative; Cancer in her unknown relative; Diabetes in her unknown relative; Heart disease in her unknown relative; Heart failure in her mother.    ROS:  Please see the history of present illness. All other systems are reviewed and  Negative to the above problem except as noted.    PHYSICAL EXAM: VS:  BP 138/60   Pulse 76   Ht 5\' 5"  (1.651 m)   Wt 177 lb (80.3 kg)   SpO2 97%   BMI 29.45 kg/m   GEN: Well nourished, well developed, in no acute distress  HEENT: normal    Neck: no JVD, carotid bruits, or masses Cardiac: RRR; no murmurs, rubs, or gallops,no edema  Respiratory:  clear to auscultation bilaterally, normal work of breathing GI: soft, nontender, nondistended, + BS  No hepatomegaly  MS: no deformity Moving all extremities   Skin: warm and dry, no rash Neuro:  Strength and sensation are intact Psych: euthymic mood, full affect   EKG:  EKG is not ordered today.   Lipid Panel    Component Value Date/Time   CHOL 149 03/26/2017 0549   TRIG 98 03/26/2017 0549   HDL 50 03/26/2017 0549   CHOLHDL 3.0 03/26/2017 0549   VLDL 20 03/26/2017 0549   LDLCALC 79 03/26/2017 0549      Wt Readings from Last 3 Encounters:  04/28/17 177 lb (80.3 kg)  04/08/17 173 lb 9.6 oz (78.7 kg)  03/27/17 228 lb 12.8 oz (103.8 kg)      ASSESSMENT AND PLAN:  1  CAD  No symptoms of angina  Keep on same meds  2  HTN  Adeq control  3  HL  LDL is adequate  HD very good     Current medicines are reviewed at length with the patient today.  The patient does not have concerns regarding medicines.  Signed, Dorris Carnes, MD  04/28/2017 1:33 PM    [facility] [place], [place], [place]  [address] Phone: [phone]; Fax: [phone]

## 2017-04-30 ENCOUNTER — Encounter: Payer: Self-pay | Admitting: *Deleted

## 2017-05-05 ENCOUNTER — Other Ambulatory Visit: Payer: Self-pay

## 2017-05-12 DIAGNOSIS — I110 Hypertensive heart disease with heart failure: Secondary | ICD-10-CM | POA: Diagnosis not present

## 2017-05-12 DIAGNOSIS — E1121 Type 2 diabetes mellitus with diabetic nephropathy: Secondary | ICD-10-CM | POA: Diagnosis not present

## 2017-05-12 DIAGNOSIS — I10 Essential (primary) hypertension: Secondary | ICD-10-CM | POA: Diagnosis not present

## 2017-05-12 DIAGNOSIS — I482 Chronic atrial fibrillation: Secondary | ICD-10-CM | POA: Diagnosis not present

## 2017-06-09 ENCOUNTER — Ambulatory Visit (INDEPENDENT_AMBULATORY_CARE_PROVIDER_SITE_OTHER): Payer: Medicare Other | Admitting: Orthopedic Surgery

## 2017-06-09 ENCOUNTER — Ambulatory Visit (INDEPENDENT_AMBULATORY_CARE_PROVIDER_SITE_OTHER): Payer: Medicare Other

## 2017-06-09 VITALS — BP 179/83 | HR 80 | Ht 63.0 in | Wt 173.0 lb

## 2017-06-09 DIAGNOSIS — M25551 Pain in right hip: Secondary | ICD-10-CM

## 2017-06-09 DIAGNOSIS — I6523 Occlusion and stenosis of bilateral carotid arteries: Secondary | ICD-10-CM

## 2017-06-09 NOTE — Progress Notes (Signed)
  NEW PATIENT OFFICE VISIT    Chief Complaint  Patient presents with  . Hip Injury    Right hip pain after falling on 04-10-17. She had hip surgery in 8443.    76 years old 60 if not 17 year status post open treatment internal excision of peritrochanteric fracture right hip with subtrochanteric component  She's done well over 17 years but recently fell in gym now complains of increased pain in her right hip no loss of motion does have a leg length discrepancy.  The pain is mild to moderate dull ache and intermittent exacerbated by weightbearing    Review of Systems  Constitutional: Negative.   Musculoskeletal: Positive for joint pain.  Neurological: Negative for tingling and sensory change.     Past Medical History:  Diagnosis Date  . Atrial flutter ([place])    typical appearing  . Bilateral pulmonary embolism ([place]) 08/2008   On coumadin  . Coronary artery disease    s/p DES to mid RCA in 02/2008; s/p inferior MI with DES to mid RCA and mid LAD in 05/2005  . Coronary atherosclerosis   . Diabetes mellitus without complication ([place])   . Hyperlipidemia   . Hypertension    Controlled  . Ischemic cardiomyopathy   . Myocardial infarction ([place])   . Unspecified combined systolic and diastolic heart failure    Asymptomatic. Echo 04/28/13 EF= 40- 45%  . Unspecified combined systolic and diastolic heart failure     Past Surgical History:  Procedure Laterality Date  . ATRIAL FLUTTER ABLATION N/A 06/17/2013   Procedure: ATRIAL FLUTTER ABLATION;  Surgeon: Thompson Grayer, MD;  Location[facility] CATH LAB;  Service: Cardiovascular;  Laterality: N/A;  . CAROTID PTA/STENT INTERVENTION N/A 04/10/2017   Procedure: Carotid PTA/Stent Intervention;  Surgeon: Algernon Huxley, MD;  Location: [facility] CV LAB;  Service: Cardiovascular;  Laterality: N/A;  . CHOLECYSTECTOMY    . HIP SURGERY      Family History  Problem Relation Age of Onset  . Heart disease Unknown   . Arthritis Unknown   . Cancer  Unknown   . Diabetes Unknown   . Heart failure Mother    Social History  Substance Use Topics  . Smoking status: Former Smoker    Years: 10.00    Types: Cigarettes    Quit date: 10/14/1978  . Smokeless tobacco: Never Used  . Alcohol use No    BP (!) 179/83   Pulse 80   Ht 5\' 3"  (1.6 m)   Wt 173 lb (78.5 kg)   BMI 30.65 kg/m   Physical Exam Well-developed well-nourished body habitus medium, oriented 3. Mood affect normal. Gait and supported by a cane and she has a small leg length discrepancy on the right Ortho Exam Right hip flexion 125 left hip flexion the same she has some pain with abduction hip is stable motor exam is normal skin is intact pulses are good sensation is normal  Left leg neurovascular exam intact  No orders of the defined types were placed in this encounter.   Encounter Diagnosis  Name Primary?  . Acute right hip pain Yes     PLAN:   No new findings on the x-ray she has severe arthritis of the right hip hardware is intact  Recommend symptomatic treatment

## 2017-06-18 ENCOUNTER — Telehealth: Payer: Self-pay | Admitting: Orthopedic Surgery

## 2017-06-18 NOTE — Telephone Encounter (Signed)
Patient's daughter and designated contact, Armonie Mettler, ph# [phone], called to rrelay that her mom "was just in to see Dr Aline Brochure" and states that she now is having some pain in the opposite knee; therefore, new problem.  Offered appointment, relayed Dr Aline Brochure out of clinic this week, and that Dr Luna Glasgow can see her sooner. Elects to hold, and said will speak with patient again, and said she may call her primary care doctor first.  Michela Pitcher will call back if needs to schedule.

## 2017-06-23 ENCOUNTER — Ambulatory Visit (HOSPITAL_COMMUNITY)
Admission: RE | Admit: 2017-06-23 | Discharge: 2017-06-23 | Disposition: A | Discharge status: Home / Self Care | Payer: Medicare Other | Source: Ambulatory Visit | Attending: Pulmonary Disease | Admitting: Pulmonary Disease

## 2017-06-23 ENCOUNTER — Other Ambulatory Visit (HOSPITAL_COMMUNITY): Payer: Self-pay | Admitting: Pulmonary Disease

## 2017-06-23 DIAGNOSIS — M79652 Pain in left thigh: Secondary | ICD-10-CM | POA: Diagnosis not present

## 2017-06-23 DIAGNOSIS — I482 Chronic atrial fibrillation: Secondary | ICD-10-CM | POA: Diagnosis not present

## 2017-06-23 DIAGNOSIS — M25462 Effusion, left knee: Secondary | ICD-10-CM | POA: Diagnosis not present

## 2017-06-23 DIAGNOSIS — M1712 Unilateral primary osteoarthritis, left knee: Secondary | ICD-10-CM | POA: Insufficient documentation

## 2017-06-23 DIAGNOSIS — R2242 Localized swelling, mass and lump, left lower limb: Secondary | ICD-10-CM

## 2017-06-23 DIAGNOSIS — M25562 Pain in left knee: Secondary | ICD-10-CM | POA: Diagnosis not present

## 2017-06-23 DIAGNOSIS — S8992XA Unspecified injury of left lower leg, initial encounter: Secondary | ICD-10-CM | POA: Diagnosis not present

## 2017-07-08 ENCOUNTER — Ambulatory Visit (INDEPENDENT_AMBULATORY_CARE_PROVIDER_SITE_OTHER): Payer: Medicare Other | Admitting: Neurology

## 2017-07-08 ENCOUNTER — Encounter: Payer: Self-pay | Admitting: Neurology

## 2017-07-08 VITALS — BP 134/64 | HR 80 | Ht 65.0 in | Wt 179.0 lb

## 2017-07-08 DIAGNOSIS — E538 Deficiency of other specified B group vitamins: Secondary | ICD-10-CM

## 2017-07-08 DIAGNOSIS — I6523 Occlusion and stenosis of bilateral carotid arteries: Secondary | ICD-10-CM | POA: Diagnosis not present

## 2017-07-08 DIAGNOSIS — R413 Other amnesia: Secondary | ICD-10-CM | POA: Diagnosis not present

## 2017-07-08 HISTORY — DX: Other amnesia: R41.3

## 2017-07-08 MED ORDER — DONEPEZIL HCL 5 MG PO TABS: 5.0000 mg | ORAL_TABLET | Freq: Every day | ORAL | 1 refills | Status: DC

## 2017-07-08 NOTE — Patient Instructions (Signed)
We will start Aricept for memory.  Begin Aricept (donepezil) at 5 mg at night for one month. If this medication is well-tolerated, please call our office and we will call in a prescription for the 10 mg tablets. Look out for side effects that may include nausea, diarrhea, weight loss, or stomach cramps. This medication will also cause a runny nose, therefore there is no need for allergy medications for this purpose.  

## 2017-07-08 NOTE — Progress Notes (Signed)
Reason for visit: Memory disturbance  Referring physician: Dr. Jari Pigg is a 76 y.o. female  History of present illness:  [name] is a 76 year old right-handed white female with a history of a memory disturbance that has been present for about a year. The patient indicates she had a small stroke around the time of onset of her memory problems but the memory issue has gradually worsened over time. The patient has undergone MRI evaluation and MRA evaluation of the head in June 2018. This study showed minimal white matter changes, MRA of the head was normal. 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 The patient does operate a motor vehicle, she denies any issues with directions or safety with driving. She does report some troubles with remembering names of people but she denies any word finding problems. She does repeat herself at times. The patient is able to keep up with medications and appointments, and she is able to do the finances without difficulty. The patient generally sleeps fairly well at night, but she may have to get up at night to use the bathroom and may occasionally have difficulty getting back to sleep. The patient denies any significant fatigue issues. She denies any difficulty controlling the bowels or the bladder. She recently hurt her left knee and she is having difficulty walking because of this. She is sent here for further evaluation.  Past Medical History:  Diagnosis Date  . Atrial flutter ([place])    typical appearing  . Bilateral pulmonary embolism ([place]) 08/2008   On coumadin  . Coronary artery disease    s/p DES to mid RCA in 02/2008; s/p inferior MI with DES to mid RCA and mid LAD in 05/2005  . Coronary atherosclerosis   . Diabetes mellitus without complication ([place])   . Hyperlipidemia   . Hypertension    Controlled  . Ischemic cardiomyopathy   . Myocardial infarction ([place])   .  Unspecified combined systolic and diastolic heart failure    Asymptomatic. Echo 04/28/13 EF= 40- 45%  . Unspecified combined systolic and diastolic heart failure     Past Surgical History:  Procedure Laterality Date  . ATRIAL FLUTTER ABLATION N/A 06/17/2013   Procedure: ATRIAL FLUTTER ABLATION;  Surgeon: Thompson Grayer, MD;  Location: [facility] CATH LAB;  Service: Cardiovascular;  Laterality: N/A;  . CAROTID PTA/STENT INTERVENTION N/A 04/10/2017   Procedure: Carotid PTA/Stent Intervention;  Surgeon: Algernon Huxley, MD;  Location: [facility] CV LAB;  Service: Cardiovascular;  Laterality: N/A;  . CHOLECYSTECTOMY    . HIP SURGERY      Family History  Problem Relation Age of Onset  . Heart disease Unknown   . Arthritis Unknown   . Cancer Unknown   . Diabetes Unknown   . Heart failure Mother     Social history:  reports that she quit smoking about 38 years ago. Her smoking use included Cigarettes. She quit after 10.00 years of use. She has never used smokeless tobacco. She reports that she does not drink alcohol or use drugs.  Medications:  Prior to Admission medications   Medication Sig Start Date End Date Taking? Authorizing Provider  amLODipine (NORVASC) 5 MG tablet Take 5 mg by mouth daily.   Yes [provider]  aspirin EC 81 MG tablet Take 81 mg by mouth daily.   Yes [provider]  atorvastatin (LIPITOR) 10 MG tablet Take 10 mg by mouth daily.  Yes [provider]  B Complex Vitamins (VITAMIN-B COMPLEX PO) Take 1 tablet by mouth daily.   Yes [provider]  enalapril (VASOTEC) 10 MG tablet Take 1 tablet (10 mg total) by mouth 2 (two) times daily. 05/27/14  Yes Belva Crome, MD  hydrochlorothiazide (HYDRODIURIL) 25 MG tablet Take 25 mg by mouth daily.   Yes [provider]  MEGARED OMEGA-3 KRILL OIL PO Take 1 capsule by mouth daily.   Yes [provider]  metFORMIN (GLUCOPHAGE) 500 MG tablet Take 1 tablet (500 mg total) by mouth 2  (two) times daily with a meal. Patient taking differently: Take 1,000 mg by mouth 2 (two) times daily with a meal.  10/12/14  Yes Sinda Du, MD  metoprolol (LOPRESSOR) 50 MG tablet Take 50 mg by mouth 2 (two) times daily.   Yes [provider]  nitroGLYCERIN (NITROSTAT) 0.4 MG SL tablet Place 0.4 mg under the tongue every 5 (five) minutes x 3 doses as needed for chest pain.    Yes [provider]  vitamin C (ASCORBIC ACID) 500 MG tablet Take 500 mg by mouth daily.   Yes [provider]  vitamin E 400 UNIT capsule Take 400 Units by mouth daily.   Yes [provider]  warfarin (COUMADIN) 5 MG tablet Take 7.5-10 mg by mouth daily at 6 PM. Take 7.5 mg (1.5 tablets) daily with the exception of 10 mg (2 tablets) on Thursdays.   Yes [provider]     No Known Allergies  ROS:  Out of a complete 14 system review of symptoms, the patient complains only of the following symptoms, and all other reviewed systems are negative.  Easy bruising Memory disturbance Left knee pain  Blood pressure 134/64, pulse 80, height 5\' 5"  (1.651 m), weight 179 lb (81.2 kg).  Physical Exam  General: The patient is alert and cooperative at the time of the examination. The patient is moderately obese.  Eyes: Pupils are equal, round, and reactive to light. Discs are flat bilaterally.  Neck: The neck is supple, no carotid bruits are noted, but some radiation of the cardiac murmur is noted in both carotids.  Respiratory: The respiratory examination is clear.  Cardiovascular: The cardiovascular examination reveals a regular rate and rhythm, a grade II/VI systolic ejection murmur is noted in aortic area.  Skin: Extremities are without significant edema on the right leg, the patient has 2+ edema on the left.  Neurologic Exam  Mental status: The patient is alert and oriented x 3 at the time of the examination. The patient has apparent normal recent and remote memory,  with an apparently normal attention span and concentration ability. Mini-Mental Status Examination done today shows a total score of 26/30.  Cranial nerves: Facial symmetry is present. There is good sensation of the face to pinprick and soft touch bilaterally. The strength of the facial muscles and the muscles to head turning and shoulder shrug are normal bilaterally. Speech is well enunciated, no aphasia or dysarthria is noted. Extraocular movements are full. Visual fields are full. The tongue is midline, and the patient has symmetric elevation of the soft palate. No obvious hearing deficits are noted.  Motor: The motor testing reveals 5 over 5 strength of all 4 extremities. Good symmetric motor tone is noted throughout.  Sensory: Sensory testing is intact to pinprick, soft touch, vibration sensation, and position sense on all 4 extremities, with exception of slight decrease in position sense in both feet. No evidence  of extinction is noted.  Coordination: Cerebellar testing reveals good finger-nose-finger and heel-to-shin bilaterally.  Gait and station: Gait is associated with a significant limping type gait on the left leg. Hand gait was not attempted. Romberg is negative.  Reflexes: Deep tendon reflexes are symmetric, but are decreased bilaterally. Toes are downgoing bilaterally.   MRI brain 03/25/17:  IMPRESSION: 1. No acute intracranial abnormality. 2. Nonspecific multifocal white matter hyperintensities, most commonly indicating chronic microvascular ischemia. 3. Normal intracranial MRA  * MRI scan images were reviewed online. I agree with the written report.    Assessment/Plan:  1. Mild memory disturbance  The patient has had a decline in memory over the last year. MRI of the brain done recently does not show evidence of significant small vessel disease. The patient will have blood work done today, she will have Aricept added to her medication regimen. She will follow-up in 6  months.  Jill Alexanders MD 07/08/2017 3:13 PM  [facility] [address] [place], [place] [address]  Phone [phone] Fax [phone]

## 2017-07-09 ENCOUNTER — Telehealth: Payer: Self-pay | Admitting: *Deleted

## 2017-07-09 LAB — VITAMIN B12: Vitamin B-12: 567 pg/mL (ref 232–1245)

## 2017-07-09 LAB — SEDIMENTATION RATE: Sed Rate: 26 mm/hr (ref 0–40)

## 2017-07-09 LAB — RPR: RPR Ser Ql: NONREACTIVE

## 2017-07-09 NOTE — Telephone Encounter (Signed)
-----   Message from Kathrynn Ducking, MD sent at 07/09/2017  7:17 AM EDT -----  The blood work results are unremarkable. Please call the patient.  ----- Message ----- From: Lavone Neri Lab Results In Sent: 07/09/2017   5:41 AM To: Kathrynn Ducking, MD

## 2017-07-09 NOTE — Telephone Encounter (Signed)
Called and LVM for patient about unremarkable labs per CW,MD note. Gave [facility] phone number if she has further questions/concerns.

## 2017-07-14 ENCOUNTER — Ambulatory Visit (INDEPENDENT_AMBULATORY_CARE_PROVIDER_SITE_OTHER): Payer: Medicare Other | Admitting: Orthopedic Surgery

## 2017-07-14 ENCOUNTER — Encounter: Payer: Self-pay | Admitting: Orthopedic Surgery

## 2017-07-14 VITALS — BP 155/73 | HR 78 | Ht 65.0 in | Wt 177.0 lb

## 2017-07-14 DIAGNOSIS — I6523 Occlusion and stenosis of bilateral carotid arteries: Secondary | ICD-10-CM

## 2017-07-14 DIAGNOSIS — M1712 Unilateral primary osteoarthritis, left knee: Secondary | ICD-10-CM | POA: Diagnosis not present

## 2017-07-14 DIAGNOSIS — S83242A Other tear of medial meniscus, current injury, left knee, initial encounter: Secondary | ICD-10-CM | POA: Diagnosis not present

## 2017-07-14 DIAGNOSIS — M25562 Pain in left knee: Secondary | ICD-10-CM

## 2017-07-14 NOTE — Progress Notes (Addendum)
NEW PATIENT OFFICE VISIT    Chief Complaint  Patient presents with  . Office Visit    New Problem L  knee pain    76 year old female presents after acute injury to the left knee 4 weeks ago (sept 3 doi)  She was getting up from a seated position she twisted her left knee felt acute medial knee pain which eventually progressed to swelling night pain and pain radiating down the medial tibia.  She's been using ice and heat combination along with Tylenol with no improvement  She has moderately severe medial knee pain which is worse at night and keeping her from sleeping    Review of Systems  Respiratory: Negative for cough and shortness of breath.   Cardiovascular: Negative for chest pain.  Musculoskeletal: Positive for joint pain.  Neurological: Negative for tingling.     Past Medical History:  Diagnosis Date  . Atrial flutter ([place])    typical appearing  . Bilateral pulmonary embolism ([place]) 08/2008   On coumadin  . Coronary artery disease    s/p DES to mid RCA in 02/2008; s/p inferior MI with DES to mid RCA and mid LAD in 05/2005  . Coronary atherosclerosis   . Diabetes mellitus without complication ([place])   . Hyperlipidemia   . Hypertension    Controlled  . Ischemic cardiomyopathy   . Memory disorder 07/08/2017  . Myocardial infarction ([place])   . Unspecified combined systolic and diastolic heart failure    Asymptomatic. Echo 04/28/13 EF= 40- 45%  . Unspecified combined systolic and diastolic heart failure     Past Surgical History:  Procedure Laterality Date  . ATRIAL FLUTTER ABLATION N/A 06/17/2013   Procedure: ATRIAL FLUTTER ABLATION;  Surgeon: Thompson Grayer, MD;  Location: [facility] CATH LAB;  Service: Cardiovascular;  Laterality: N/A;  . CAROTID PTA/STENT INTERVENTION N/A 04/10/2017   Procedure: Carotid PTA/Stent Intervention;  Surgeon: Algernon Huxley, MD;  Location: [facility] CV LAB;  Service: Cardiovascular;  Laterality: N/A;  . CHOLECYSTECTOMY    . HIP SURGERY       Family History  Problem Relation Age of Onset  . Heart disease Unknown   . Arthritis Unknown   . Cancer Unknown   . Diabetes Unknown   . Heart failure Mother    Social History  Substance Use Topics  . Smoking status: Former Smoker    Years: 10.00    Types: Cigarettes    Quit date: 10/14/1978  . Smokeless tobacco: Never Used  . Alcohol use No    BP (!) 155/73   Pulse 78   Ht 5\' 5"  (1.651 m)   Wt 177 lb (80.3 kg)   BMI 29.45 kg/m   Physical Exam  Constitutional: She is oriented to person, place, and time. She appears well-developed and well-nourished.  HENT:  Head: Normocephalic and atraumatic.  Neck: Neck supple. No tracheal deviation present.  Cardiovascular: Intact distal pulses.   Musculoskeletal:       Left knee: Medial joint line tenderness noted.  Neurological: She is alert and oriented to person, place, and time. She displays normal reflexes. No cranial nerve deficit. She exhibits normal muscle tone. Coordination normal.  Skin: Skin is warm and dry.  Psychiatric: She has a normal mood and affect. Her behavior is normal. Judgment and thought content normal.    Left Knee Exam  Swelling: Mild Effusion: Yes  Tenderness  The patient is experiencing tenderness in the medial joint line.  Range of Motion  Extension: 5 Flexion:  120  Muscle Strength  Normal left knee strength  Tests  Drawer:       Anterior - Negative       Right Knee Exam  Swelling: None Effusion: No  Tenderness  None  Range of Motion  Extension: Normal Flexion:     120  Tests  Drawer:       Anterior - Negative          My independent interpretation of the plain films 4 views left knee done for left knee pain joint space narrowing of the medial compartment is greater than 75% of the joint space is also greater than 50% narrowing of the joint space laterally. There is severe osteopenia. My impression is that this is an example of osteoarthritis of left knee with normal  alignment with severe joint space narrowing primarily medially   Encounter Diagnoses  Name Primary?  . Left knee pain, unspecified chronicity Yes  . Primary osteoarthritis of left knee      Plan aspiration injection left knee  Procedure note injection and aspiration left knee joint  Verbal consent was obtained to aspirate and inject the left knee joint   Timeout was completed to confirm the site of aspiration and injection  An 18-gauge needle was used to aspirate the left knee joint from a suprapatellar lateral approach.  The medications used were 40 mg of Depo-Medrol and 1% lidocaine 3 cc  Anesthesia was provided by ethyl chloride and the skin was prepped with alcohol.  After cleaning the skin with alcohol an 18-gauge needle was used to aspirate the right knee joint.  We obtained 20 cc of fluid, clear  We followed this by injection of 40 mg of Depo-Medrol and 3 cc 1% lidocaine.  There were no complications. A sterile bandage was applied.  Recommend that she continue ice and ibuprofen 400 mg 3 times a day while we await MRI of the knee for presumed meniscal tear medial probable surgery needed

## 2017-07-14 NOTE — Patient Instructions (Addendum)
You have received an injection of steroids into the joint. 15% of patients will have increased pain within the 24 hours postinjection.   This is transient and will go away.   We recommend that you use ice packs on the injection site for 20 minutes every 2 hours and extra strength Tylenol 2 tablets every 8 as needed until the pain resolves.  If you continue to have pain after taking the Tylenol and using the ice please call the office for further instructions.   Meniscus Tear A meniscus tear is a knee injury in which a piece of the meniscus is torn. The meniscus is a thick, rubbery, wedge-shaped cartilage in the knee. Two menisci are located in each knee. They sit between the upper bone (femur) and lower bone (tibia) that make up the knee joint. Each meniscus acts as a shock absorber for the knee. A torn meniscus is one of the most common types of knee injuries. This injury can range from mild to severe. Surgery may be needed for a severe tear. What are the causes? This injury may be caused by any squatting, twisting, or pivoting movement. Sports-related injuries are the most common cause. These often occur from:  Running and stopping suddenly.  Changing direction.  Being tackled or knocked off your feet.  As people get older, their meniscus gets thinner and weaker. In these people, tears can happen more easily, such as from climbing stairs. What increases the risk? This injury is more likely to happen to:  People who play contact sports.  Males.  People who are 31?77 years of age.  What are the signs or symptoms? Symptoms of this injury include:  Knee pain, especially at the side of the knee joint. You may feel pain when the injury occurs, or you may only hear a pop and feel pain later.  A feeling that your knee is clicking, catching, locking, or giving way.  Not being able to fully bend or extend your knee.  Bruising or swelling in your knee.  How is this diagnosed? This  injury may be diagnosed based on your symptoms and a physical exam. The physical exam may include:  Moving your knee in different ways.  Feeling for tenderness.  Listening for a clicking sound.  Checking if your knee locks or catches.  You may also have tests, such as:  X-rays.  MRI.  A procedure to look inside your knee with a narrow surgical telescope (arthroscopy).  You may be referred to a knee specialist (orthopedic surgeon). How is this treated? Treatment for this injury depends on the severity of the tear. Treatment for a mild tear may include:  Rest.  Medicine to reduce pain and swelling. This is usually a nonsteroidal anti-inflammatory drug (NSAID).  A knee brace or an elastic sleeve or wrap.  Using crutches or a walker to keep weight off your knee and to help you walk.  Exercises to strengthen your knee (physical therapy).  You may need surgery if you have a severe tear or if other treatments are not working. Follow these instructions at home: Managing pain and swelling  Take over-the-counter and prescription medicines only as told by your health care provider.  If directed, apply ice to the injured area: ? Put ice in a plastic bag. ? Place a towel between your skin and the bag. ? Leave the ice on for 20 minutes, 2-3 times per day.  Raise (elevate) the injured area above the level of your heart while  you are sitting or lying down. Activity  Do not use the injured limb to support your body weight until your health care provider says that you can. Use crutches or a walker as told by your health care provider.  Return to your normal activities as told by your health care provider. Ask your health care provider what activities are safe for you.  Perform range-of-motion exercises only as told by your health care provider.  Begin doing exercises to strengthen your knee and leg muscles only as told by your health care provider. After you recover, your health  care provider may recommend these exercises to help prevent another injury. General instructions  Use a knee brace or elastic wrap as told by your health care provider.  Keep all follow-up visits as told by your health care provider. This is important. Contact a health care provider if:  You have a fever.  Your knee becomes red, tender, or swollen.  Your pain medicine is not helping.  Your symptoms get worse or do not improve after 2 weeks of home care. This information is not intended to replace advice given to you by your health care provider. Make sure you discuss any questions you have with your health care provider. Document Released: 12/21/2002 Document Revised: 03/07/2016 Document Reviewed: 01/23/2015 Elsevier Interactive Patient Education  2018 [place] ice 3 times a day for 20 minutes Take 2 Advil 3 times a day

## 2017-07-15 ENCOUNTER — Encounter: Payer: Self-pay | Admitting: Orthopedic Surgery

## 2017-07-17 ENCOUNTER — Ambulatory Visit (HOSPITAL_COMMUNITY)
Admission: RE | Admit: 2017-07-17 | Discharge: 2017-07-17 | Disposition: A | Discharge status: Home / Self Care | Payer: Medicare Other | Source: Ambulatory Visit | Attending: Orthopedic Surgery | Admitting: Orthopedic Surgery

## 2017-07-17 DIAGNOSIS — X58XXXA Exposure to other specified factors, initial encounter: Secondary | ICD-10-CM | POA: Diagnosis not present

## 2017-07-17 DIAGNOSIS — M25462 Effusion, left knee: Secondary | ICD-10-CM | POA: Diagnosis not present

## 2017-07-17 DIAGNOSIS — M25562 Pain in left knee: Secondary | ICD-10-CM | POA: Diagnosis not present

## 2017-07-17 DIAGNOSIS — M7122 Synovial cyst of popliteal space [Baker], left knee: Secondary | ICD-10-CM | POA: Insufficient documentation

## 2017-07-17 DIAGNOSIS — S83242A Other tear of medial meniscus, current injury, left knee, initial encounter: Secondary | ICD-10-CM | POA: Diagnosis not present

## 2017-07-21 ENCOUNTER — Encounter: Payer: Self-pay | Admitting: Orthopedic Surgery

## 2017-07-21 ENCOUNTER — Ambulatory Visit (INDEPENDENT_AMBULATORY_CARE_PROVIDER_SITE_OTHER): Payer: Medicare Other | Admitting: Orthopedic Surgery

## 2017-07-21 VITALS — BP 140/67 | HR 89 | Resp 14 | Ht 63.0 in | Wt 174.0 lb

## 2017-07-21 DIAGNOSIS — S83242D Other tear of medial meniscus, current injury, left knee, subsequent encounter: Secondary | ICD-10-CM

## 2017-07-21 DIAGNOSIS — I6523 Occlusion and stenosis of bilateral carotid arteries: Secondary | ICD-10-CM

## 2017-07-21 NOTE — Progress Notes (Addendum)
Follow-up visit status post MRI left knee for torn medial meniscus  The meniscus is torn she also has arthritis of the knee  She reports her knee is improving and therefore after discussion with her regarding her treatment options we decided to proceed with nonoperative treatment. If the knee gets worse then we can always arthroscopically remove that torn meniscus  Encounter Diagnosis  Name Primary?  . Acute medial meniscus tear of left knee, subsequent encounter Yes

## 2017-07-30 DIAGNOSIS — L723 Sebaceous cyst: Secondary | ICD-10-CM | POA: Diagnosis not present

## 2017-07-30 DIAGNOSIS — L821 Other seborrheic keratosis: Secondary | ICD-10-CM | POA: Diagnosis not present

## 2017-08-12 DIAGNOSIS — E1121 Type 2 diabetes mellitus with diabetic nephropathy: Secondary | ICD-10-CM | POA: Diagnosis not present

## 2017-08-12 DIAGNOSIS — I482 Chronic atrial fibrillation: Secondary | ICD-10-CM | POA: Diagnosis not present

## 2017-08-12 DIAGNOSIS — I10 Essential (primary) hypertension: Secondary | ICD-10-CM | POA: Diagnosis not present

## 2017-08-12 DIAGNOSIS — Z23 Encounter for immunization: Secondary | ICD-10-CM | POA: Diagnosis not present

## 2017-08-12 DIAGNOSIS — I110 Hypertensive heart disease with heart failure: Secondary | ICD-10-CM | POA: Diagnosis not present

## 2017-08-20 ENCOUNTER — Telehealth: Payer: Self-pay | Admitting: Orthopedic Surgery

## 2017-08-20 NOTE — Telephone Encounter (Signed)
Your plan at last visit 07/21/17 was  If the knee gets worse then we can always arthroscopically remove that torn meniscus  Do you want to schedule, or bring her back in the office to discuss further?

## 2017-08-20 NOTE — Telephone Encounter (Signed)
Patient and daughter, designated contact, Pam, [phone], called with questions about possible scheduling of surgery.  Please advise.

## 2017-08-21 NOTE — Telephone Encounter (Signed)
We can schedule but I need to look at schedule to see when I can do it  I ll be out of town a lot in the next few weeks

## 2017-08-25 NOTE — Telephone Encounter (Signed)
Patient wants to schedule for as soon as possible. Will discuss with Dr Aline Brochure tomorrow. She is on Coumadin and will need to get clearance to d/c this.

## 2017-08-25 NOTE — Telephone Encounter (Signed)
Daughter will call back, they are thinking early December.

## 2017-08-26 NOTE — Telephone Encounter (Signed)
Daughter has stated patient wants surgery in Dec, she indicated patient is on Coumadin, and she would need to stop it, I did not see a note in your record, can we discuss ?

## 2017-08-27 NOTE — Telephone Encounter (Signed)
[name] (I do not know who she is with) is over Coumdain regimen. Patients daughter indicates she will call us back to schedule a time in December, and she will ask about coming off coumadin, I will discuss further with daughter when she calls me back.

## 2017-08-27 NOTE — Telephone Encounter (Signed)
Ok but I need a date to tell her when to stop the warfarin   And  Who is handling the warfarin so I can ask if its ok to stop it

## 2017-09-08 ENCOUNTER — Telehealth: Payer: Self-pay | Admitting: Orthopedic Surgery

## 2017-09-08 NOTE — Telephone Encounter (Signed)
Patient's designated contact, daughter Alynn Ellithorpe, called to request to proceed with scheduling her arthroscopic knee surgery before Christmas. Please call her at ph# [phone]. (reference: previous phone note of 08/20/17 of advising about Coumadin, once surgery date is determined)

## 2017-09-08 NOTE — Telephone Encounter (Signed)
You told her If the knee gets worse then you would  arthroscopically remove the torn meniscus/ left knee she is ready to schedule.  She is on coumadin, after surgery is posted, we will need to advise patient of the instructions / Christella Noa is managing the Coumadin.

## 2017-09-09 ENCOUNTER — Other Ambulatory Visit: Payer: Self-pay | Admitting: Orthopedic Surgery

## 2017-09-09 NOTE — Telephone Encounter (Signed)
I scheduled her for the 13th   Stop warfarinlast day taken dec 8

## 2017-09-09 NOTE — Telephone Encounter (Signed)
Dr Luan Pulling office is the MD, they have asked patients daughter to have [place] send them a note, so I have sent a letter and the last office visit note to them to ask about d/c of coumadin.

## 2017-09-12 NOTE — Telephone Encounter (Signed)
Fax received from Dr Luan Pulling, ok to stop Coumadin 5 days in advance of knee scope

## 2017-09-17 ENCOUNTER — Ambulatory Visit (INDEPENDENT_AMBULATORY_CARE_PROVIDER_SITE_OTHER): Payer: Medicare Other | Admitting: *Deleted

## 2017-09-17 DIAGNOSIS — Z7901 Long term (current) use of anticoagulants: Secondary | ICD-10-CM | POA: Diagnosis not present

## 2017-09-17 DIAGNOSIS — I4892 Unspecified atrial flutter: Secondary | ICD-10-CM | POA: Diagnosis not present

## 2017-09-17 LAB — POCT INR: INR: 1

## 2017-09-18 ENCOUNTER — Telehealth: Payer: Self-pay | Admitting: Radiology

## 2017-09-18 NOTE — Telephone Encounter (Signed)
Patients daughter concerned about weather, d/c coumadin , etc, wants to know what you think about her d/c coumadin then cancelling surgery if weather is bad.   Daughter Pam [phone]   I told her Hoyle Sauer would call to RS if weather is bad, but her concern is the coumadin.

## 2017-09-19 NOTE — Patient Instructions (Signed)
[name]  09/19/2017     @PREFPERIOPPHARMACY @   Your procedure is scheduled on  09/25/2017.  Report to [facility] at  615   A.M.  Call this number if you have problems the morning of surgery:  [phone]   Remember:  Do not eat food or drink liquids after midnight.  Take these medicines the morning of surgery with A SIP OF WATER norvasc, enalapril, metoprolol.    Do not wear jewelry, make-up or nail polish.  Do not wear lotions, powders, or perfumes, or deoderant.  Do not shave 48 hours prior to surgery.  Men may shave face and neck.  Do not bring valuables to the hospital.  [facility] is not responsible for any belongings or valuables.  Contacts, dentures or bridgework may not be worn into surgery.  Leave your suitcase in the car.  After surgery it may be brought to your room.  For patients admitted to the hospital, discharge time will be determined by your treatment team.  Patients discharged the day of surgery will not be allowed to drive home.   Name and phone number of your driver:   Family Special instructions:  STOP your coumadin. Your last dose should be 09/20/2017.  Please read over the following fact sheets that you were given. MRSA Information, Anesthesia Post-op Instructions and Care and Recovery After Surgery      Knee Ligament Injury, Arthroscopy Arthroscopy is a surgical technique in which your health care provider examines your knee through a small, pencil-sized telescope (arthroscope). Often, repairs to injured ligaments can be done with instruments in the arthroscope. Arthroscopy is less invasive than open-knee surgery. Tell a health care provider about:  Any allergies you have.  All medicines you are taking, including vitamins, herbs, eye drops, creams, and over-the-counter medicines.  Any problems you or family members have had with anesthetic medicines.  Any blood disorders you have.  Any surgeries you have had.  Any  medical conditions you have. What are the risks? Generally, this is a safe procedure. However, as with any procedure, problems can occur. Possible problems include:  Infection.  Bleeding.  Stiffness.  What happens before the procedure?  Ask your health care provider about changing or stopping any regular medicines. Avoid taking aspirin or blood thinners as directed by your health care provider.  Do not eat or drink anything after midnight the night before surgery.  If you smoke, do not smoke for at least 2 weeks before your surgery.  Do not drink alcohol starting the day before your surgery.  Let your health care provider know if you develop a cold or any infection before your surgery.  Arrange for someone to drive you home after the surgery or after your hospital stay. Also arrange for someone to help you with activities during recovery. What happens during the procedure?  Small monitors will be put on your body. They are used to check your heart, blood pressure, and oxygen levels.  An IV access tube will be put into one of your veins. Medicine will be able to flow directly into your body through this IV tube.  You might be given a medicine to help you relax (sedative).  You will be given a medicine that makes you go to sleep (general anesthetic), and a breathing tube will be placed into your lungs during the procedure.  Several small incisions are made in your knee. Saline fluid is placed into  one of the incisions to expand the knee and clear away any blood in the knee.  Your health care provider will insert the arthroscope to examine the injured knee.  During arthroscopy, your health care provider may find a partial or complete tear in a ligament.  Tools can be inserted through the other incisions to repair the injured ligaments.  The incisions are then closed with absorbable stitches and covered with dressings. What happens after the procedure?  You will be taken to  the recovery area where you will be monitored.  When you are awake, stable, and taking fluids without problems, you will be allowed to go home. This information is not intended to replace advice given to you by your health care provider. Make sure you discuss any questions you have with your health care provider. Document Released: 09/27/2000 Document Revised: 03/07/2016 Document Reviewed: 05/12/2013 Elsevier Interactive Patient Education  2017 [employer].  Arthroscopic Knee Ligament Repair, Care After This sheet gives you information about how to care for yourself after your procedure. Your health care provider may also give you more specific instructions. If you have problems or questions, contact your health care provider. What can I expect after the procedure? After the procedure, it is common to have:  Pain in your knee.  Bruising and swelling on your knee, calf, and ankle for 3-4 days.  Fatigue.  Follow these instructions at home: If you have a brace or immobilizer:  Wear the brace or immobilizer as told by your health care provider. Remove it only as told by your health care provider.  Loosen the splint or immobilizer if your toes tingle, become numb, or turn cold and blue.  Keep the brace or immobilizer clean. Bathing  Do not take baths, swim, or use a hot tub until your health care provider approves. Ask your health care provider if you can take showers.  Keep your bandage (dressing) dry until your health care provider says that it can be removed. Cover it and your brace or immobilizer with a watertight covering when you take a shower. Incision care  Follow instructions from your health care provider about how to take care of your incision. Make sure you: ? Wash your hands with soap and water before you change your bandage (dressing). If soap and water are not available, use hand sanitizer. ? Change your dressing as told by your health care provider. ? Leave stitches  (sutures), skin glue, or adhesive strips in place. These skin closures may need to stay in place for 2 weeks or longer. If adhesive strip edges start to loosen and curl up, you may trim the loose edges. Do not remove adhesive strips completely unless your health care provider tells you to do that.  Check your incision area every day for signs of infection. Check for: ? More redness, swelling, or pain. ? More fluid or blood. ? Warmth. ? Pus or a bad smell. Managing pain, stiffness, and swelling  If directed, put ice on the affected area. ? If you have a removable brace or immobilizer, remove it as told by your health care provider. ? Put ice in a plastic bag. ? Place a towel between your skin and the bag or between your brace or immobilizer and the bag. ? Leave the ice on for 20 minutes, 2-3 times a day.  Move your toes often to avoid stiffness and to lessen swelling.  Raise (elevate) the injured area above the level of your heart while you are  sitting or lying down. Driving  Do not drive until your health care provider approves. If you have a brace or immobilizer on your leg, ask your health care provider when it is safe for you to drive.  Do not drive or use heavy machinery while taking prescription pain medicine. Activity  Rest as directed. Ask your health care provider what activities are safe for you.  Do physical therapy exercises as told by your health care provider. Physical therapy will help you regain strength and motion in your knee.  Follow instructions from your health care provider about: ? When you may start motion exercises. ? When you may start riding a stationary bike and doing other low-impact activities. ? When you may start to jog and do other high-impact activities. Safety  Do not use the injured limb to support your body weight until your health care provider says that you can. Use crutches as told by your health care provider. General instructions  Do not  use any products that contain nicotine or tobacco, such as cigarettes and e-cigarettes. These can delay bone healing. If you need help quitting, ask your health care provider.  To prevent or treat constipation while you are taking prescription pain medicine, your health care provider may recommend that you: ? Drink enough fluid to keep your urine clear or pale yellow. ? Take over-the-counter or prescription medicines. ? Eat foods that are high in fiber, such as fresh fruits and vegetables, whole grains, and beans. ? Limit foods that are high in fat and processed sugars, such as fried and sweet foods.  Take over-the-counter and prescription medicines only as told by your health care provider.  Keep all follow-up visits as told by your health care provider. This is important. Contact a health care provider if:  You have more redness, swelling, or pain around an incision.  You have more fluid or blood coming from an incision.  Your incision feels warm to the touch.  You have a fever.  You have pain or swelling in your knee, and it gets worse.  You have pain that does not get better with medicine. Get help right away if:  You have trouble breathing.  You have pus or a bad smell coming from an incision.  You have numbness and tingling near the knee joint. Summary  After the procedure, it is common to have knee pain with bruising and swelling on your knee, calf, and ankle.  Icing your knee and raising your leg above the level of your heart will help control the pain and the swelling.  Do physical therapy exercises as told by your health care provider. Physical therapy will help you regain strength and motion in your knee. This information is not intended to replace advice given to you by your health care provider. Make sure you discuss any questions you have with your health care provider. Document Released: 07/21/2013 Document Revised: 09/24/2016 Document Reviewed:  09/24/2016 Elsevier Interactive Patient Education  2017 [employer] Anesthesia, Adult General anesthesia is the use of medicines to make a person "go to sleep" (be unconscious) for a medical procedure. General anesthesia is often recommended when a procedure:  Is long.  Requires you to be still or in an unusual position.  Is major and can cause you to lose blood.  Is impossible to do without general anesthesia.  The medicines used for general anesthesia are called general anesthetics. In addition to making you sleep, the medicines:  Prevent pain.  Control your blood pressure.  Relax your muscles.  Tell a health care provider about:  Any allergies you have.  All medicines you are taking, including vitamins, herbs, eye drops, creams, and over-the-counter medicines.  Any problems you or family members have had with anesthetic medicines.  Types of anesthetics you have had in the past.  Any bleeding disorders you have.  Any surgeries you have had.  Any medical conditions you have.  Any history of heart or lung conditions, such as heart failure, sleep apnea, or chronic obstructive pulmonary disease (COPD).  Whether you are pregnant or may be pregnant.  Whether you use tobacco, alcohol, marijuana, or street drugs.  Any history of Armed forces logistics/support/administrative officer.  Any history of depression or anxiety. What are the risks? Generally, this is a safe procedure. However, problems may occur, including:  Allergic reaction to anesthetics.  Lung and heart problems.  Inhaling food or liquids from your stomach into your lungs (aspiration).  Injury to nerves.  Waking up during your procedure and being unable to move (rare).  Extreme agitation or a state of mental confusion (delirium) when you wake up from the anesthetic.  Air in the bloodstream, which can lead to stroke.  These problems are more likely to develop if you are having a major surgery or if you have an advanced  medical condition. You can prevent some of these complications by answering all of your health care provider's questions thoroughly and by following all pre-procedure instructions. General anesthesia can cause side effects, including:  Nausea or vomiting  A sore throat from the breathing tube.  Feeling cold or shivery.  Feeling tired, washed out, or achy.  Sleepiness or drowsiness.  Confusion or agitation.  What happens before the procedure? Staying hydrated Follow instructions from your health care provider about hydration, which may include:  Up to 2 hours before the procedure - you may continue to drink clear liquids, such as water, clear fruit juice, black coffee, and plain tea.  Eating and drinking restrictions Follow instructions from your health care provider about eating and drinking, which may include:  8 hours before the procedure - stop eating heavy meals or foods such as meat, fried foods, or fatty foods.  6 hours before the procedure - stop eating light meals or foods, such as toast or cereal.  6 hours before the procedure - stop drinking milk or drinks that contain milk.  2 hours before the procedure - stop drinking clear liquids.  Medicines  Ask your health care provider about: ? Changing or stopping your regular medicines. This is especially important if you are taking diabetes medicines or blood thinners. ? Taking medicines such as aspirin and ibuprofen. These medicines can thin your blood. Do not take these medicines before your procedure if your health care provider instructs you not to. ? Taking new dietary supplements or medicines. Do not take these during the week before your procedure unless your health care provider approves them.  If you are told to take a medicine or to continue taking a medicine on the day of the procedure, take the medicine with sips of water. General instructions   Ask if you will be going home the same day, the following day,  or after a longer hospital stay. ? Plan to have someone take you home. ? Plan to have someone stay with you for the first 24 hours after you leave the hospital or clinic.  For 3-6 weeks before the procedure, try not to use any  tobacco products, such as cigarettes, chewing tobacco, and e-cigarettes.  You may brush your teeth on the morning of the procedure, but make sure to spit out the toothpaste. What happens during the procedure?  You will be given anesthetics through a mask and through an IV tube in one of your veins.  You may receive medicine to help you relax (sedative).  As soon as you are asleep, a breathing tube may be used to help you breathe.  An anesthesia specialist will stay with you throughout the procedure. He or she will help keep you comfortable and safe by continuing to give you medicines and adjusting the amount of medicine that you get. He or she will also watch your blood pressure, pulse, and oxygen levels to make sure that the anesthetics do not cause any problems.  If a breathing tube was used to help you breathe, it will be removed before you wake up. The procedure may vary among health care providers and hospitals. What happens after the procedure?  You will wake up, often slowly, after the procedure is complete, usually in a recovery area.  Your blood pressure, heart rate, breathing rate, and blood oxygen level will be monitored until the medicines you were given have worn off.  You may be given medicine to help you calm down if you feel anxious or agitated.  If you will be going home the same day, your health care provider may check to make sure you can stand, drink, and urinate.  Your health care providers will treat your pain and side effects before you go home.  Do not drive for 24 hours if you received a sedative.  You may: ? Feel nauseous and vomit. ? Have a sore throat. ? Have mental slowness. ? Feel cold or shivery. ? Feel sleepy. ? Feel  tired. ? Feel sore or achy, even in parts of your body where you did not have surgery. This information is not intended to replace advice given to you by your health care provider. Make sure you discuss any questions you have with your health care provider. Document Released: 01/07/2008 Document Revised: 03/12/2016 Document Reviewed: 09/14/2015 Elsevier Interactive Patient Education  2018 [place] Anesthesia, Adult, Care After These instructions provide you with information about caring for yourself after your procedure. Your health care provider may also give you more specific instructions. Your treatment has been planned according to current medical practices, but problems sometimes occur. Call your health care provider if you have any problems or questions after your procedure. What can I expect after the procedure? After the procedure, it is common to have:  Vomiting.  A sore throat.  Mental slowness.  It is common to feel:  Nauseous.  Cold or shivery.  Sleepy.  Tired.  Sore or achy, even in parts of your body where you did not have surgery.  Follow these instructions at home: For at least 24 hours after the procedure:  Do not: ? Participate in activities where you could fall or become injured. ? Drive. ? Use heavy machinery. ? Drink alcohol. ? Take sleeping pills or medicines that cause drowsiness. ? Make important decisions or sign legal documents. ? Take care of children on your own.  Rest. Eating and drinking  If you vomit, drink water, juice, or soup when you can drink without vomiting.  Drink enough fluid to keep your urine clear or pale yellow.  Make sure you have little or no nausea before eating solid foods.  Follow  the diet recommended by your health care provider. General instructions  Have a responsible adult stay with you until you are awake and alert.  Return to your normal activities as told by your health care provider. Ask your  health care provider what activities are safe for you.  Take over-the-counter and prescription medicines only as told by your health care provider.  If you smoke, do not smoke without supervision.  Keep all follow-up visits as told by your health care provider. This is important. Contact a health care provider if:  You continue to have nausea or vomiting at home, and medicines are not helpful.  You cannot drink fluids or start eating again.  You cannot urinate after 8-12 hours.  You develop a skin rash.  You have fever.  You have increasing redness at the site of your procedure. Get help right away if:  You have difficulty breathing.  You have chest pain.  You have unexpected bleeding.  You feel that you are having a life-threatening or urgent problem. This information is not intended to replace advice given to you by your health care provider. Make sure you discuss any questions you have with your health care provider. Document Released: 01/06/2001 Document Revised: 03/04/2016 Document Reviewed: 09/14/2015 Elsevier Interactive Patient Education  [employer].

## 2017-09-19 NOTE — Telephone Encounter (Signed)
Shouldn't be a problem.

## 2017-09-19 NOTE — Telephone Encounter (Signed)
Left message for her / will try again before I leave today

## 2017-09-22 ENCOUNTER — Encounter (HOSPITAL_COMMUNITY)
Admission: RE | Admit: 2017-09-22 | Discharge: 2017-09-22 | Disposition: A | Discharge status: Home / Self Care | Payer: Medicare Other | Source: Ambulatory Visit | Attending: Orthopedic Surgery | Admitting: Orthopedic Surgery

## 2017-09-24 ENCOUNTER — Other Ambulatory Visit: Payer: Self-pay

## 2017-09-24 ENCOUNTER — Encounter (HOSPITAL_COMMUNITY)
Admission: RE | Admit: 2017-09-24 | Discharge: 2017-09-24 | Disposition: A | Discharge status: Home / Self Care | Payer: Medicare Other | Source: Ambulatory Visit | Attending: Orthopedic Surgery | Admitting: Orthopedic Surgery

## 2017-09-24 ENCOUNTER — Encounter (HOSPITAL_COMMUNITY): Payer: Self-pay

## 2017-09-24 DIAGNOSIS — S83282A Other tear of lateral meniscus, current injury, left knee, initial encounter: Secondary | ICD-10-CM | POA: Diagnosis not present

## 2017-09-24 DIAGNOSIS — Z86718 Personal history of other venous thrombosis and embolism: Secondary | ICD-10-CM | POA: Insufficient documentation

## 2017-09-24 DIAGNOSIS — I5042 Chronic combined systolic (congestive) and diastolic (congestive) heart failure: Secondary | ICD-10-CM | POA: Diagnosis not present

## 2017-09-24 DIAGNOSIS — Z8673 Personal history of transient ischemic attack (TIA), and cerebral infarction without residual deficits: Secondary | ICD-10-CM | POA: Insufficient documentation

## 2017-09-24 DIAGNOSIS — Z86711 Personal history of pulmonary embolism: Secondary | ICD-10-CM | POA: Diagnosis not present

## 2017-09-24 DIAGNOSIS — M659 Synovitis and tenosynovitis, unspecified: Secondary | ICD-10-CM | POA: Diagnosis not present

## 2017-09-24 DIAGNOSIS — I110 Hypertensive heart disease with heart failure: Secondary | ICD-10-CM | POA: Insufficient documentation

## 2017-09-24 DIAGNOSIS — E785 Hyperlipidemia, unspecified: Secondary | ICD-10-CM | POA: Insufficient documentation

## 2017-09-24 DIAGNOSIS — Z955 Presence of coronary angioplasty implant and graft: Secondary | ICD-10-CM | POA: Insufficient documentation

## 2017-09-24 DIAGNOSIS — Z7901 Long term (current) use of anticoagulants: Secondary | ICD-10-CM | POA: Insufficient documentation

## 2017-09-24 DIAGNOSIS — X501XXA Overexertion from prolonged static or awkward postures, initial encounter: Secondary | ICD-10-CM | POA: Insufficient documentation

## 2017-09-24 DIAGNOSIS — Z7984 Long term (current) use of oral hypoglycemic drugs: Secondary | ICD-10-CM | POA: Insufficient documentation

## 2017-09-24 DIAGNOSIS — S83242A Other tear of medial meniscus, current injury, left knee, initial encounter: Secondary | ICD-10-CM | POA: Diagnosis not present

## 2017-09-24 DIAGNOSIS — Z7982 Long term (current) use of aspirin: Secondary | ICD-10-CM | POA: Insufficient documentation

## 2017-09-24 DIAGNOSIS — I252 Old myocardial infarction: Secondary | ICD-10-CM | POA: Insufficient documentation

## 2017-09-24 DIAGNOSIS — I255 Ischemic cardiomyopathy: Secondary | ICD-10-CM | POA: Insufficient documentation

## 2017-09-24 DIAGNOSIS — E1151 Type 2 diabetes mellitus with diabetic peripheral angiopathy without gangrene: Secondary | ICD-10-CM | POA: Insufficient documentation

## 2017-09-24 DIAGNOSIS — M2242 Chondromalacia patellae, left knee: Secondary | ICD-10-CM | POA: Diagnosis not present

## 2017-09-24 DIAGNOSIS — Z87891 Personal history of nicotine dependence: Secondary | ICD-10-CM | POA: Diagnosis not present

## 2017-09-24 DIAGNOSIS — I2510 Atherosclerotic heart disease of native coronary artery without angina pectoris: Secondary | ICD-10-CM | POA: Insufficient documentation

## 2017-09-24 HISTORY — DX: Cerebral infarction, unspecified: I63.9

## 2017-09-24 HISTORY — DX: Unspecified osteoarthritis, unspecified site: M19.90

## 2017-09-24 LAB — CBC WITH DIFFERENTIAL/PLATELET
Basophils Absolute: 0.1 10*3/uL (ref 0.0–0.1)
Basophils Relative: 2 %
Eosinophils Absolute: 0.1 10*3/uL (ref 0.0–0.7)
Eosinophils Relative: 2 %
HCT: 35.4 % — ABNORMAL LOW (ref 36.0–46.0)
Hemoglobin: 11.1 g/dL — ABNORMAL LOW (ref 12.0–15.0)
Lymphocytes Relative: 41 %
Lymphs Abs: 2.3 10*3/uL (ref 0.7–4.0)
MCH: 29 pg (ref 26.0–34.0)
MCHC: 31.4 g/dL (ref 30.0–36.0)
MCV: 92.4 fL (ref 78.0–100.0)
Monocytes Absolute: 0.5 10*3/uL (ref 0.1–1.0)
Monocytes Relative: 8 %
Neutro Abs: 2.6 10*3/uL (ref 1.7–7.7)
Neutrophils Relative %: 47 %
Platelets: 396 10*3/uL (ref 150–400)
RBC: 3.83 MIL/uL — ABNORMAL LOW (ref 3.87–5.11)
RDW: 13.8 % (ref 11.5–15.5)
WBC: 5.6 10*3/uL (ref 4.0–10.5)

## 2017-09-24 LAB — SURGICAL PCR SCREEN
MRSA, PCR: POSITIVE — AB
Staphylococcus aureus: POSITIVE — AB

## 2017-09-24 LAB — APTT: aPTT: 27 seconds (ref 24–36)

## 2017-09-24 LAB — BASIC METABOLIC PANEL
Anion gap: 10 (ref 5–15)
BUN: 28 mg/dL — ABNORMAL HIGH (ref 6–20)
CO2: 23 mmol/L (ref 22–32)
Calcium: 9.4 mg/dL (ref 8.9–10.3)
Chloride: 104 mmol/L (ref 101–111)
Creatinine, Ser: 1.15 mg/dL — ABNORMAL HIGH (ref 0.44–1.00)
GFR calc Af Amer: 52 mL/min — ABNORMAL LOW (ref >60)
GFR calc non Af Amer: 45 mL/min — ABNORMAL LOW (ref >60)
Glucose, Bld: 150 mg/dL — ABNORMAL HIGH (ref 65–99)
Potassium: 4.4 mmol/L (ref 3.5–5.1)
Sodium: 137 mmol/L (ref 135–145)

## 2017-09-24 LAB — PROTIME-INR
INR: 0.92
Prothrombin Time: 12.3 seconds (ref 11.4–15.2)

## 2017-09-24 NOTE — H&P (Signed)
HISTORY AND PHYSICAL FOR AMBULATORY SURGERY   Chief Complaint  Patient presents with  . Office Visit      New Problem L  knee pain      76 year old female presents after acute injury to the left knee September 2018   She was getting up from a seated position she twisted her left knee felt acute medial knee pain which eventually progressed to swelling night pain and pain radiating down the medial tibia.   She's been using ice and heat combination along with Tylenol with no improvement   She has moderately severe medial knee pain which is worse at night and keeping her from sleeping    She is on warfarin and therefore we gave her a cortisone injection which gave her relief for several months however, her pain increased and she opted for surgical treatment instead of continued nonoperative care     Review of Systems  Respiratory: Negative for cough and shortness of breath.   Cardiovascular: Negative for chest pain.  Musculoskeletal: Positive for joint pain.  Neurological: Negative for tingling.  Remaining review of systems are negative         Past Medical History:  Diagnosis Date  . Atrial flutter ([place])      typical appearing  . Bilateral pulmonary embolism ([place]) 08/2008    On coumadin  . Coronary artery disease      s/p DES to mid RCA in 02/2008; s/p inferior MI with DES to mid RCA and mid LAD in 05/2005  . Coronary atherosclerosis    . Diabetes mellitus without complication ([place])    . Hyperlipidemia    . Hypertension      Controlled  . Ischemic cardiomyopathy    . Memory disorder 07/08/2017  . Myocardial infarction ([place])    . Unspecified combined systolic and diastolic heart failure      Asymptomatic. Echo 04/28/13 EF= 40- 45%  . Unspecified combined systolic and diastolic heart failure             Past Surgical History:  Procedure Laterality Date  . ATRIAL FLUTTER ABLATION N/A 06/17/2013    Procedure: ATRIAL FLUTTER ABLATION;  Surgeon: Thompson Grayer, MD;  Location:  [facility] CATH LAB;  Service: Cardiovascular;  Laterality: N/A;  . CAROTID PTA/STENT INTERVENTION N/A 04/10/2017    Procedure: Carotid PTA/Stent Intervention;  Surgeon: Algernon Huxley, MD;  Location: [place] CV LAB;  Service: Cardiovascular;  Laterality: N/A;  . CHOLECYSTECTOMY      . HIP SURGERY               Family History  Problem Relation Age of Onset  . Heart disease Unknown    . Arthritis Unknown    . Cancer Unknown    . Diabetes Unknown    . Heart failure Mother           Social History  Substance Use Topics  . Smoking status: Former Smoker      Years: 10.00      Types: Cigarettes      Quit date: 10/14/1978  . Smokeless tobacco: Never Used  . Alcohol use No      BP (!) 155/73   Pulse 78   Ht 5\' 5"  (1.651 m)   Wt 177 lb (80.3 kg)   BMI 29.45 kg/m    Physical Exam  Constitutional: She is oriented to person, place, and time. She appears well-developed and well-nourished.  HENT:  Head: Normocephalic and atraumatic.  Neck: Neck supple. No tracheal  deviation present.  Cardiovascular: Intact distal pulses.   Musculoskeletal:       Left knee: Medial joint line tenderness noted.  Neurological: She is alert and oriented to person, place, and time. She displays normal reflexes. No cranial nerve deficit. She exhibits normal muscle tone. Coordination normal.  Skin: Skin is warm and dry.  Psychiatric: She has a normal mood and affect. Her behavior is normal. Judgment and thought content normal.      Left Knee Exam  Swelling: Mild Effusion: Yes   Tenderness  The patient is experiencing tenderness in the medial joint line.   Range of Motion  Extension: 5 Flexion:     120   Muscle Strength  Normal left knee strength   Tests  Drawer:       Anterior - Negative        Right Knee Exam  Swelling: None Effusion: No   Tenderness  None   Range of Motion  Extension: Normal Flexion:     120   Tests  Drawer:       Anterior - Negative               My independent  interpretation of the plain films 4 views left knee done for left knee pain joint space narrowing of the medial compartment is greater than 75% of the joint space is also greater than 50% narrowing of the joint space laterally. There is severe osteopenia. My impression is that this is an example of osteoarthritis of left knee with normal alignment with severe joint space narrowing primarily medially  This is her MRI report IMPRESSION: 1. Radial tear involving the posterior horn of the medial meniscus at the meniscal root with partial detachment and medial protrusion of the likely dysfunctional meniscus. This is likely also contributing to advanced medial compartment degenerative changes and a subchondral stress fracture involving the medial tibial plateau. 2. Tricompartmental degenerative changes. 3. Intact ligamentous structures. 4. Moderate-sized joint effusion and small Baker's cyst.    After thorough review of the MRI and radiographs it is apparent that she has a torn nonfunctioning medial meniscus with joint space narrowing consistent with osteoarthritis primarily medial compartment but involving all 3 compartments.  While she may not get complete pain relief from the arthroscopic surgery debriding the torn meniscus should give her some pain relief and she says that she can deal with remaining arthritic pain given her overall medical condition  We stopped her warfarin on December 8  CBC Latest Ref Rng & Units 09/24/2017 03/25/2017 06/24/2015  WBC 4.0 - 10.5 K/uL 5.6 6.4 5.7  Hemoglobin 12.0 - 15.0 g/dL 11.1(L) 11.4(L) 11.4(L)  Hematocrit 36.0 - 46.0 % 35.4(L) 34.9(L) 34.7(L)  Platelets 150 - 400 K/uL 396 302 257   BMP Latest Ref Rng & Units 09/24/2017 04/10/2017 03/25/2017  Glucose 65 - 99 mg/dL 150(H) - 115(H)  BUN 6 - 20 mg/dL 28(H) 27(H) 38(H)  Creatinine 0.44 - 1.00 mg/dL 1.15(H) 1.21(H) 1.39(H)  Sodium 135 - 145 mmol/L 137 - 139  Potassium 3.5 - 5.1 mmol/L 4.4 - 4.5  Chloride 101  - 111 mmol/L 104 - 102  CO2 22 - 32 mmol/L 23 - 26  Calcium 8.9 - 10.3 mg/dL 9.4 - 9.4   INR 0.92 ON 12/12   We will now proceed with arthroscopy and partial medial meniscectomy of the left knee

## 2017-09-25 ENCOUNTER — Telehealth: Payer: Self-pay | Admitting: Orthopedic Surgery

## 2017-09-25 ENCOUNTER — Encounter (HOSPITAL_COMMUNITY)
Admission: RE | Disposition: A | Discharge status: Home / Self Care | Payer: Self-pay | Source: Ambulatory Visit | Attending: Orthopedic Surgery

## 2017-09-25 ENCOUNTER — Ambulatory Visit (HOSPITAL_COMMUNITY): Payer: Medicare Other | Admitting: Anesthesiology

## 2017-09-25 ENCOUNTER — Ambulatory Visit (HOSPITAL_COMMUNITY)
Admission: RE | Admit: 2017-09-25 | Discharge: 2017-09-25 | Disposition: A | Discharge status: Home / Self Care | Payer: Medicare Other | Source: Ambulatory Visit | Attending: Orthopedic Surgery | Admitting: Orthopedic Surgery

## 2017-09-25 ENCOUNTER — Encounter (HOSPITAL_COMMUNITY): Payer: Self-pay | Admitting: *Deleted

## 2017-09-25 DIAGNOSIS — I110 Hypertensive heart disease with heart failure: Secondary | ICD-10-CM | POA: Diagnosis not present

## 2017-09-25 DIAGNOSIS — Z7901 Long term (current) use of anticoagulants: Secondary | ICD-10-CM | POA: Diagnosis not present

## 2017-09-25 DIAGNOSIS — M23322 Other meniscus derangements, posterior horn of medial meniscus, left knee: Secondary | ICD-10-CM | POA: Diagnosis not present

## 2017-09-25 DIAGNOSIS — M23352 Other meniscus derangements, posterior horn of lateral meniscus, left knee: Secondary | ICD-10-CM

## 2017-09-25 DIAGNOSIS — S83282A Other tear of lateral meniscus, current injury, left knee, initial encounter: Secondary | ICD-10-CM | POA: Diagnosis not present

## 2017-09-25 DIAGNOSIS — M1712 Unilateral primary osteoarthritis, left knee: Secondary | ICD-10-CM

## 2017-09-25 DIAGNOSIS — I255 Ischemic cardiomyopathy: Secondary | ICD-10-CM | POA: Diagnosis not present

## 2017-09-25 DIAGNOSIS — I5042 Chronic combined systolic (congestive) and diastolic (congestive) heart failure: Secondary | ICD-10-CM | POA: Diagnosis not present

## 2017-09-25 DIAGNOSIS — I2510 Atherosclerotic heart disease of native coronary artery without angina pectoris: Secondary | ICD-10-CM | POA: Diagnosis not present

## 2017-09-25 DIAGNOSIS — M659 Synovitis and tenosynovitis, unspecified: Secondary | ICD-10-CM | POA: Diagnosis not present

## 2017-09-25 DIAGNOSIS — S83242A Other tear of medial meniscus, current injury, left knee, initial encounter: Secondary | ICD-10-CM | POA: Diagnosis not present

## 2017-09-25 DIAGNOSIS — M2242 Chondromalacia patellae, left knee: Secondary | ICD-10-CM | POA: Diagnosis not present

## 2017-09-25 DIAGNOSIS — I252 Old myocardial infarction: Secondary | ICD-10-CM | POA: Diagnosis not present

## 2017-09-25 DIAGNOSIS — E1151 Type 2 diabetes mellitus with diabetic peripheral angiopathy without gangrene: Secondary | ICD-10-CM | POA: Diagnosis not present

## 2017-09-25 DIAGNOSIS — E785 Hyperlipidemia, unspecified: Secondary | ICD-10-CM | POA: Diagnosis not present

## 2017-09-25 HISTORY — PX: KNEE ARTHROSCOPY WITH MEDIAL MENISECTOMY: SHX5651

## 2017-09-25 LAB — GLUCOSE, CAPILLARY
Glucose-Capillary: 144 mg/dL — ABNORMAL HIGH (ref 65–99)
Glucose-Capillary: 151 mg/dL — ABNORMAL HIGH (ref 65–99)

## 2017-09-25 SURGERY — ARTHROSCOPY, KNEE, WITH MEDIAL MENISCECTOMY
Anesthesia: General | Site: Knee | Laterality: Left

## 2017-09-25 MED ORDER — MIDAZOLAM HCL 2 MG/2ML IJ SOLN
1.0000 mg | INTRAMUSCULAR | Status: AC
  Administered 2017-09-25: 1 mg via INTRAVENOUS

## 2017-09-25 MED ORDER — ETOMIDATE 2 MG/ML IV SOLN
INTRAVENOUS | Status: AC
  Filled 2017-09-25: qty 10

## 2017-09-25 MED ORDER — FENTANYL CITRATE (PF) 100 MCG/2ML IJ SOLN
INTRAMUSCULAR | Status: DC | PRN
  Administered 2017-09-25 (×2): 50 ug via INTRAVENOUS

## 2017-09-25 MED ORDER — MIDAZOLAM HCL 2 MG/2ML IJ SOLN
INTRAMUSCULAR | Status: AC
  Filled 2017-09-25: qty 2

## 2017-09-25 MED ORDER — BUPIVACAINE-EPINEPHRINE (PF) 0.5% -1:200000 IJ SOLN
INTRAMUSCULAR | Status: AC
  Filled 2017-09-25: qty 60

## 2017-09-25 MED ORDER — SODIUM CHLORIDE 0.9 % IR SOLN
Status: DC | PRN
  Administered 2017-09-25: 3000 mL
  Administered 2017-09-25: 6000 mL
  Administered 2017-09-25: 3000 mL

## 2017-09-25 MED ORDER — LACTATED RINGERS IV SOLN
INTRAVENOUS | Status: DC
  Administered 2017-09-25: 08:00:00 via INTRAVENOUS

## 2017-09-25 MED ORDER — LIDOCAINE HCL (PF) 1 % IJ SOLN
INTRAMUSCULAR | Status: AC
  Filled 2017-09-25: qty 5

## 2017-09-25 MED ORDER — PROPOFOL 10 MG/ML IV BOLUS
INTRAVENOUS | Status: AC
  Filled 2017-09-25: qty 20

## 2017-09-25 MED ORDER — EPHEDRINE SULFATE 50 MG/ML IJ SOLN
INTRAMUSCULAR | Status: AC
  Filled 2017-09-25: qty 1

## 2017-09-25 MED ORDER — HYDROCODONE-ACETAMINOPHEN 5-325 MG PO TABS: 1.0000 | ORAL_TABLET | ORAL | 0 refills | Status: DC | PRN

## 2017-09-25 MED ORDER — PHENYLEPHRINE HCL 10 MG/ML IJ SOLN
INTRAMUSCULAR | Status: DC | PRN
  Administered 2017-09-25 (×10): 80 ug via INTRAVENOUS

## 2017-09-25 MED ORDER — EPINEPHRINE PF 1 MG/ML IJ SOLN
INTRAMUSCULAR | Status: AC
  Filled 2017-09-25: qty 5

## 2017-09-25 MED ORDER — PROPOFOL 10 MG/ML IV BOLUS
INTRAVENOUS | Status: DC | PRN
Start: 2017-09-25 — End: 2017-09-25
  Administered 2017-09-25: 50 mg via INTRAVENOUS

## 2017-09-25 MED ORDER — LIDOCAINE HCL 2 % EX GEL
CUTANEOUS | Status: DC | PRN
  Administered 2017-09-25: 30 via TOPICAL

## 2017-09-25 MED ORDER — SODIUM CHLORIDE 0.9 % IR SOLN
Status: DC | PRN
  Administered 2017-09-25: 1000 mL

## 2017-09-25 MED ORDER — BUPIVACAINE-EPINEPHRINE (PF) 0.5% -1:200000 IJ SOLN
INTRAMUSCULAR | Status: DC | PRN
  Administered 2017-09-25: 60 mL via PERINEURAL

## 2017-09-25 MED ORDER — ETOMIDATE 2 MG/ML IV SOLN
INTRAVENOUS | Status: DC | PRN
  Administered 2017-09-25: 10 mg via INTRAVENOUS

## 2017-09-25 MED ORDER — FENTANYL CITRATE (PF) 100 MCG/2ML IJ SOLN: 25.0000 ug | INTRAMUSCULAR | Status: DC | PRN

## 2017-09-25 MED ORDER — SODIUM CHLORIDE 0.9 % IJ SOLN
INTRAMUSCULAR | Status: AC
  Filled 2017-09-25: qty 10

## 2017-09-25 MED ORDER — ONDANSETRON HCL 4 MG/2ML IJ SOLN
INTRAMUSCULAR | Status: AC
  Filled 2017-09-25: qty 2

## 2017-09-25 MED ORDER — ONDANSETRON HCL 4 MG/2ML IJ SOLN
INTRAMUSCULAR | Status: DC | PRN
  Administered 2017-09-25: 4 mg via INTRAVENOUS

## 2017-09-25 MED ORDER — CHLORHEXIDINE GLUCONATE 4 % EX LIQD: 60.0000 mL | Freq: Once | CUTANEOUS | Status: DC

## 2017-09-25 MED ORDER — POVIDONE-IODINE 10 % EX SWAB: 2.0000 "application " | Freq: Once | CUTANEOUS | Status: DC

## 2017-09-25 MED ORDER — FENTANYL CITRATE (PF) 100 MCG/2ML IJ SOLN
INTRAMUSCULAR | Status: AC
  Filled 2017-09-25: qty 2

## 2017-09-25 MED ORDER — CEFAZOLIN SODIUM-DEXTROSE 2-4 GM/100ML-% IV SOLN
2.0000 g | INTRAVENOUS | Status: AC
  Administered 2017-09-25: 2 g via INTRAVENOUS
  Filled 2017-09-25: qty 100

## 2017-09-25 SURGICAL SUPPLY — 53 items
ARTHROWAND PARAGON T2 (SURGICAL WAND)
BAG HAMPER (MISCELLANEOUS) ×2 IMPLANT
BANDAGE ELASTIC 6 LF NS (GAUZE/BANDAGES/DRESSINGS) ×2 IMPLANT
BLADE AGGRESSIVE PLUS 4.0 (BLADE) ×2 IMPLANT
BLADE SURG SZ11 CARB STEEL (BLADE) ×2 IMPLANT
BUR AGGRESSIVE PLUS 5.0 (BURR) ×2 IMPLANT
CHLORAPREP W/TINT 26ML (MISCELLANEOUS) ×2 IMPLANT
CLOTH BEACON ORANGE TIMEOUT ST (SAFETY) ×2 IMPLANT
COOLER CRYO IC GRAV AND TUBE (ORTHOPEDIC SUPPLIES) ×2 IMPLANT
CUFF CRYO KNEE18X23 MED (MISCELLANEOUS) ×2 IMPLANT
CUFF TOURNIQUET SINGLE 34IN LL (TOURNIQUET CUFF) ×2 IMPLANT
CUTTER ANGLED DBL BITE 4.5 (BURR) IMPLANT
CUTTER TOMCAT 4.0M (BURR) ×2 IMPLANT
DECANTER SPIKE VIAL GLASS SM (MISCELLANEOUS) ×4 IMPLANT
GAUZE SPONGE 4X4 12PLY STRL (GAUZE/BANDAGES/DRESSINGS) ×2 IMPLANT
GAUZE SPONGE 4X4 16PLY XRAY LF (GAUZE/BANDAGES/DRESSINGS) ×2 IMPLANT
GAUZE XEROFORM 5X9 LF (GAUZE/BANDAGES/DRESSINGS) ×2 IMPLANT
GLOVE BIO SURGEON STRL SZ7 (GLOVE) ×2 IMPLANT
GLOVE BIOGEL PI IND STRL 6.5 (GLOVE) ×1 IMPLANT
GLOVE BIOGEL PI IND STRL 7.0 (GLOVE) ×1 IMPLANT
GLOVE BIOGEL PI INDICATOR 6.5 (GLOVE) ×1
GLOVE BIOGEL PI INDICATOR 7.0 (GLOVE) ×1
GLOVE SKINSENSE NS SZ8.0 LF (GLOVE) ×1
GLOVE SKINSENSE STRL SZ8.0 LF (GLOVE) ×1 IMPLANT
GLOVE SS N UNI LF 8.5 STRL (GLOVE) ×2 IMPLANT
GOWN STRL REUS W/ TWL LRG LVL3 (GOWN DISPOSABLE) ×1 IMPLANT
GOWN STRL REUS W/TWL LRG LVL3 (GOWN DISPOSABLE) ×1
GOWN STRL REUS W/TWL XL LVL3 (GOWN DISPOSABLE) ×2 IMPLANT
HLDR LEG FOAM (MISCELLANEOUS) ×1 IMPLANT
IV NS IRRIG 3000ML ARTHROMATIC (IV SOLUTION) ×8 IMPLANT
KIT BLADEGUARD II DBL (SET/KITS/TRAYS/PACK) ×2 IMPLANT
KIT ROOM TURNOVER AP CYSTO (KITS) ×2 IMPLANT
LEG HOLDER FOAM (MISCELLANEOUS) ×1
MANIFOLD NEPTUNE II (INSTRUMENTS) ×2 IMPLANT
MARKER SKIN DUAL TIP RULER LAB (MISCELLANEOUS) ×2 IMPLANT
NEEDLE HYPO 18GX1.5 BLUNT FILL (NEEDLE) ×2 IMPLANT
NEEDLE HYPO 21X1.5 SAFETY (NEEDLE) ×2 IMPLANT
NEEDLE SPNL 18GX3.5 QUINCKE PK (NEEDLE) ×2 IMPLANT
NS IRRIG 1000ML POUR BTL (IV SOLUTION) ×2 IMPLANT
PACK ARTHRO LIMB DRAPE STRL (MISCELLANEOUS) ×2 IMPLANT
PAD ABD 5X9 TENDERSORB (GAUZE/BANDAGES/DRESSINGS) ×2 IMPLANT
PAD ARMBOARD 7.5X6 YLW CONV (MISCELLANEOUS) ×2 IMPLANT
PADDING CAST COTTON 6X4 STRL (CAST SUPPLIES) ×2 IMPLANT
PROBE BIPOLAR 50 DEGREE SUCT (MISCELLANEOUS) ×2 IMPLANT
PROBE BIPOLAR ATHRO 135MM 90D (MISCELLANEOUS) IMPLANT
SET ARTHROSCOPY INST (INSTRUMENTS) ×2 IMPLANT
SET ARTHROSCOPY PUMP TUBE (IRRIGATION / IRRIGATOR) ×2 IMPLANT
SET BASIN LINEN APH (SET/KITS/TRAYS/PACK) ×2 IMPLANT
SUT ETHILON 3 0 FSL (SUTURE) ×2 IMPLANT
SYR 30ML LL (SYRINGE) ×2 IMPLANT
SYRINGE 10CC LL (SYRINGE) ×2 IMPLANT
TUBE CONNECTING 12X1/4 (SUCTIONS) ×6 IMPLANT
WAND ARTHRO PARAGON T2 (SURGICAL WAND) IMPLANT

## 2017-09-25 NOTE — Transfer of Care (Signed)
Immediate Anesthesia Transfer of Care Note  Patient: [name]  Procedure(s) Performed: KNEE ARTHROSCOPY WITH MEDIAL AND LATERAL MENISECTOMY (Left Knee)  Patient Location: PACU  Anesthesia Type:General  Level of Consciousness: awake, alert  and oriented  Airway & Oxygen Therapy: Patient Spontanous Breathing and Patient connected to nasal cannula oxygen  Post-op Assessment: Report given to RN and Post -op Vital signs reviewed and stable  Post vital signs: Reviewed and stable  Last Vitals:  Vitals:   09/25/17 0730 09/25/17 0735  BP: (!) 160/69   Resp: 15 18  Temp:    SpO2: 93% 96%    Last Pain:  Vitals:   09/25/17 0647  TempSrc: Oral      Patients Stated Pain Goal: 5 (99/37/16 9678)  Complications: No apparent anesthesia complications

## 2017-09-25 NOTE — Interval H&P Note (Signed)
History and Physical Interval Note:  09/25/2017 7:19 AM   Temp 98 F (36.7 C) (Oral)  CBC Latest Ref Rng & Units 09/24/2017 03/25/2017 06/24/2015  WBC 4.0 - 10.5 K/uL 5.6 6.4 5.7  Hemoglobin 12.0 - 15.0 g/dL 11.1(L) 11.4(L) 11.4(L)  Hematocrit 36.0 - 46.0 % 35.4(L) 34.9(L) 34.7(L)  Platelets 150 - 400 K/uL 396 302 257    BMP Latest Ref Rng & Units 09/24/2017 04/10/2017 03/25/2017  Glucose 65 - 99 mg/dL 150(H) - 115(H)  BUN 6 - 20 mg/dL 28(H) 27(H) 38(H)  Creatinine 0.44 - 1.00 mg/dL 1.15(H) 1.21(H) 1.39(H)  Sodium 135 - 145 mmol/L 137 - 139  Potassium 3.5 - 5.1 mmol/L 4.4 - 4.5  Chloride 101 - 111 mmol/L 104 - 102  CO2 22 - 32 mmol/L 23 - 26  Calcium 8.9 - 10.3 mg/dL 9.4 - 9.4    MRI IMPRESSION: 1. Radial tear involving the posterior horn of the medial meniscus at the meniscal root with partial detachment and medial protrusion of the likely dysfunctional meniscus. This is likely also contributing to advanced medial compartment degenerative changes and a subchondral stress fracture involving the medial tibial plateau. 2. Tricompartmental degenerative changes. 3. Intact ligamentous structures. 4. Moderate-sized joint effusion and small Baker's cyst.  The   [name]  has presented today for surgery, with the diagnosis of medial meniscus tear left knee  The various methods of treatment have been discussed with the patient and family. After consideration of risks, benefits and other options for treatment, the patient has consented to  Procedure(s): KNEE ARTHROSCOPY WITH MEDIAL MENISECTOMY (Left) as a surgical intervention .  The patient's history has been reviewed, patient examined, no change in status, stable for surgery.  I have reviewed the patient's chart and labs.  Questions were answered to the patient's satisfaction.     Arther Abbott

## 2017-09-25 NOTE — Brief Op Note (Addendum)
09/25/2017  8:47 AM  PATIENT:  [name]  75 y.o. female  PRE-OPERATIVE DIAGNOSIS:  medial meniscus tear left knee  POST-OPERATIVE DIAGNOSIS:  medial meniscus tear left knee, lateral meniscus tear left knee, primary osteoarthritis grade 2 patella grade 2 medial femoral condyle grade 2 lateral femoral condyle, extensive synovitis  PROCEDURE:  Procedure(s): KNEE ARTHROSCOPY WITH MEDIAL AND LATERAL MENISECTOMY (Left) and chondroplasty of the patella  SURGEON:  Surgeon(s) and Role:    [name], MD - Primary  PHYSICIAN ASSISTANT:   ASSISTANTS: none   ANESTHESIA:   general  EBL:  50 mL   BLOOD ADMINISTERED:none  DRAINS: none   LOCAL MEDICATIONS USED:  MARCAINE     SPECIMEN:  No Specimen  DISPOSITION OF SPECIMEN:  N/A  COUNTS:  YES  TOURNIQUET:  * Missing tourniquet times found for documented tourniquets in log: [id number] *  DICTATION: .Dragon Dictation  PLAN OF CARE: Discharge to home after PACU  PATIENT DISPOSITION:  PACU - hemodynamically stable.   Delay start of Pharmacological VTE agent (>24hrs) due to surgical blood loss or risk of bleeding: no

## 2017-09-25 NOTE — Anesthesia Postprocedure Evaluation (Signed)
Anesthesia Post Note  Patient: [name]  Procedure(s) Performed: KNEE ARTHROSCOPY WITH MEDIAL AND LATERAL MENISECTOMY (Left Knee)  Patient location during evaluation: PACU Anesthesia Type: General Level of consciousness: awake and alert Pain management: satisfactory to patient Vital Signs Assessment: post-procedure vital signs reviewed and stable Respiratory status: spontaneous breathing and patient connected to nasal cannula oxygen Cardiovascular status: stable Postop Assessment: no apparent nausea or vomiting Anesthetic complications: no     Last Vitals:  Vitals:   09/25/17 0915 09/25/17 0930  BP: (!) 113/49 (!) 134/54  Pulse: 73 78  Resp: 15 12  Temp:    SpO2: 98% 96%    Last Pain:  Vitals:   09/25/17 0930  TempSrc:   PainSc: 2                  [name]

## 2017-09-25 NOTE — Anesthesia Procedure Notes (Signed)
Procedure Name: LMA Insertion Date/Time: 09/25/2017 7:52 AM Performed by: Jonna Munro, CRNA Pre-anesthesia Checklist: Patient identified, Emergency Drugs available, Suction available, Patient being monitored and Timeout performed Patient Re-evaluated:Patient Re-evaluated prior to induction Oxygen Delivery Method: Circle system utilized Preoxygenation: Pre-oxygenation with 100% oxygen Induction Type: Combination inhalational/ intravenous induction LMA: LMA inserted LMA Size: 4.0 Number of attempts: 1 Placement Confirmation: positive ETCO2 and breath sounds checked- equal and bilateral Tube secured with: Tape Dental Injury: Teeth and Oropharynx as per pre-operative assessment

## 2017-09-25 NOTE — Telephone Encounter (Signed)
[name], pharmacist at [facility], [place], has question regarding prescription given at time of hospital discharge  HYDROcodone-acetaminophen (NORCO/VICODIN) 5-325 MG tablet 40 tablet

## 2017-09-25 NOTE — Discharge Instructions (Signed)

## 2017-09-25 NOTE — Anesthesia Preprocedure Evaluation (Signed)
Anesthesia Evaluation  Patient identified by MRN, date of birth, ID band Patient awake    Reviewed: Allergy & Precautions, NPO status , Patient's Chart, lab work & pertinent test results, reviewed documented beta blocker date and time   Airway Mallampati: II  TM Distance: >3 FB Neck ROM: Full    Dental  (+) Partial Upper   Pulmonary former smoker, PE   breath sounds clear to auscultation       Cardiovascular hypertension, Pt. on medications and Pt. on home beta blockers + CAD, + Past MI, + Peripheral Vascular Disease, +CHF and + DVT  + dysrhythmias Atrial Fibrillation  Rhythm:Regular Rate:Normal     Neuro/Psych CVA    GI/Hepatic negative GI ROS, Neg liver ROS,   Endo/Other  diabetes, Type 2  Renal/GU Renal disease     Musculoskeletal  (+) Arthritis ,   Abdominal   Peds  Hematology   Anesthesia Other Findings   Reproductive/Obstetrics                             Anesthesia Physical Anesthesia Plan  ASA: IV  Anesthesia Plan: General   Post-op Pain Management:    Induction: Intravenous  PONV Risk Score and Plan:   Airway Management Planned: LMA  Additional Equipment:   Intra-op Plan:   Post-operative Plan: Extubation in OR  Informed Consent: I have reviewed the patients History and Physical, chart, labs and discussed the procedure including the risks, benefits and alternatives for the proposed anesthesia with the patient or authorized representative who has indicated his/her understanding and acceptance.     Plan Discussed with:   Anesthesia Plan Comments:         Anesthesia Quick Evaluation

## 2017-09-25 NOTE — Op Note (Signed)
Knee arthroscopy dictation  The patient was identified in the preoperative holding area using 2 approved identification mechanisms. The chart was reviewed and updated. The surgical site was confirmed as left knee and marked with an indelible marker.  The patient was taken to the operating room for anesthesia. After successful general anesthesia, IV antibiotics were started  The patient was placed in the supine position with the (left) the operative extremity in an arthroscopic leg holder and the opposite extremity in a padded leg holder.  The timeout was executed.  A lateral portal was established with an 11 blade and the scope was introduced into the joint. A diagnostic arthroscopy was performed in circumferential manner examining the entire knee joint. A medial portal was established and the diagnostic arthroscopy was repeated using a probe to palpate intra-articular structures as they were encountered.   The operative findings are   Medial torn meniscus medial root grade 2 chondral changes medial femoral condyle Lateral grade 2 fissure lateral femoral condyle root tear lateral meniscus Patellofemoral grade II chondromalacia lateral facet patella Notch normal ACL PCL Extensive synovitis throughout the knee   The medial meniscus was resected using a duckbill forceps. The meniscal fragments were removed with a motorized shaver. The meniscus was balanced with a combination of a motorized shaver and a 50 ArthroCare wand until a stable rim was obtained.  The lateral meniscus were resected with a duckbill forceps meniscal fragments were removed with motorized shaver ArthroCare wand was used to balance the meniscus meniscal rim confirmed stable with the probe  The arthroscopic pump was placed on the wash mode and any excess debris was removed from the joint using suction.  60 cc of Marcaine with epinephrine was injected through the arthroscope.  The portals were closed with 3-0 nylon  suture.  A sterile bandage, Ace wrap and Cryo/Cuff was placed and the Cryo/Cuff was activated. The patient was taken to the recovery room in stable condition.

## 2017-09-26 ENCOUNTER — Encounter (HOSPITAL_COMMUNITY): Payer: Self-pay | Admitting: Orthopedic Surgery

## 2017-09-26 NOTE — Telephone Encounter (Signed)
Patient has never used Hydrocodone before, and it is written for 1-2 q 4 hours prn. I have advised she is s/p surgery, and she can use this to push Rx through insurance. She has stated in the future, it may be best to write for 1 q 4 hrs prn if patients have never used hydrocodone, so she does not get a medicare rejection  To you FYI only, this is taken care of.

## 2017-09-26 NOTE — Telephone Encounter (Signed)
What?

## 2017-10-02 DIAGNOSIS — Z9889 Other specified postprocedural states: Secondary | ICD-10-CM | POA: Insufficient documentation

## 2017-10-03 ENCOUNTER — Encounter: Payer: Self-pay | Admitting: Orthopedic Surgery

## 2017-10-03 ENCOUNTER — Other Ambulatory Visit (INDEPENDENT_AMBULATORY_CARE_PROVIDER_SITE_OTHER): Payer: Self-pay | Admitting: Vascular Surgery

## 2017-10-03 ENCOUNTER — Ambulatory Visit (INDEPENDENT_AMBULATORY_CARE_PROVIDER_SITE_OTHER): Payer: Self-pay | Admitting: Orthopedic Surgery

## 2017-10-03 VITALS — BP 155/67 | HR 80 | Ht 63.0 in | Wt 174.0 lb

## 2017-10-03 DIAGNOSIS — Z9889 Other specified postprocedural states: Secondary | ICD-10-CM

## 2017-10-03 DIAGNOSIS — I6523 Occlusion and stenosis of bilateral carotid arteries: Secondary | ICD-10-CM

## 2017-10-03 NOTE — Progress Notes (Signed)
09/25/2017  8:47 AM  PATIENT:  [name]  76 y.o. female  PRE-OPERATIVE DIAGNOSIS:  medial meniscus tear left knee  POST-OPERATIVE DIAGNOSIS:  medial meniscus tear left knee, lateral meniscus tear left knee, primary osteoarthritis grade 2 patella grade 2 medial femoral condyle grade 2 lateral femoral condyle, extensive synovitis  PROCEDURE:  Procedure(s): KNEE ARTHROSCOPY WITH MEDIAL AND LATERAL MENISECTOMY (Left) and chondroplasty of the patella   No real complaints she is walking with a walker she is doing her exercises her portals look good we will see her in about a month and a half

## 2017-10-08 ENCOUNTER — Telehealth: Payer: Self-pay | Admitting: Radiology

## 2017-10-08 NOTE — Telephone Encounter (Signed)
Patients husband has called she had knee scope on 09/25/17 he is concerned states he thinks she needs some therapy, she is not up and moving much. He states he thinks she is afraid to use the knee much. I have told him I will ask you if okay to order physical therapy  Ok to order eval/ treat balance/ gait training with walker/ and cane?

## 2017-10-09 NOTE — Telephone Encounter (Signed)
Thanks I have called him to advise.

## 2017-10-09 NOTE — Telephone Encounter (Signed)
No just tell him I gave her exercises to do

## 2017-10-10 ENCOUNTER — Ambulatory Visit (INDEPENDENT_AMBULATORY_CARE_PROVIDER_SITE_OTHER): Payer: Medicare Other | Admitting: Vascular Surgery

## 2017-10-10 ENCOUNTER — Encounter (INDEPENDENT_AMBULATORY_CARE_PROVIDER_SITE_OTHER): Payer: Medicare Other

## 2017-10-13 ENCOUNTER — Ambulatory Visit (INDEPENDENT_AMBULATORY_CARE_PROVIDER_SITE_OTHER): Payer: Medicare Other | Admitting: *Deleted

## 2017-10-13 DIAGNOSIS — Z7901 Long term (current) use of anticoagulants: Secondary | ICD-10-CM | POA: Diagnosis not present

## 2017-10-13 DIAGNOSIS — I4892 Unspecified atrial flutter: Secondary | ICD-10-CM

## 2017-10-13 LAB — POCT INR: INR: 3

## 2017-10-13 NOTE — Patient Instructions (Signed)
Decrease coumadin to 1 1/2 tablets daily except 1 tablet on Mondays, Wednesdays and Fridays Recheck in 2.5 weeks

## 2017-10-29 ENCOUNTER — Ambulatory Visit (INDEPENDENT_AMBULATORY_CARE_PROVIDER_SITE_OTHER): Payer: Medicare Other | Admitting: *Deleted

## 2017-10-29 DIAGNOSIS — Z7901 Long term (current) use of anticoagulants: Secondary | ICD-10-CM | POA: Diagnosis not present

## 2017-10-29 DIAGNOSIS — I4892 Unspecified atrial flutter: Secondary | ICD-10-CM | POA: Diagnosis not present

## 2017-10-29 LAB — POCT INR: INR: 2.3

## 2017-10-29 NOTE — Patient Instructions (Signed)
Continue coumadin 1 1/2 tablets daily except 1 tablet on Mondays, Wednesdays and Fridays Recheck in 3 weeks 

## 2017-11-07 ENCOUNTER — Encounter: Payer: Self-pay | Admitting: Orthopedic Surgery

## 2017-11-07 ENCOUNTER — Ambulatory Visit (INDEPENDENT_AMBULATORY_CARE_PROVIDER_SITE_OTHER): Payer: Self-pay | Admitting: Orthopedic Surgery

## 2017-11-07 VITALS — BP 142/69 | HR 78 | Ht 65.0 in | Wt 178.0 lb

## 2017-11-07 DIAGNOSIS — Z9889 Other specified postprocedural states: Secondary | ICD-10-CM

## 2017-11-07 DIAGNOSIS — Z4889 Encounter for other specified surgical aftercare: Secondary | ICD-10-CM

## 2017-11-07 NOTE — Progress Notes (Signed)
POST OP VISIT   Patient ID: [name], female   DOB: March 16, 1941, 77 y.o.   MRN: [id number]  Chief Complaint  Patient presents with  . Post-op Follow-up    left knee arthroscopy 09/25/17    Encounter Diagnosis  Name Primary?  . S/P left knee arthroscopy 09/25/17 Yes    S/P medial and lateral meniscectomy patellar chondroplasty   She complains of intermittent swelling she has some residual discomfort in the knee  She did regained her range of motion trace joint effusionreal tenderness she is ambulatory with a cane which she has been for several years after her hip surgery  We recommend continue ice as needed twice a day use the cane do the exercises follow-up with [place] if pain worsens or swelling gets worse   Encounter Diagnoses  Name Primary?  . S/P left knee arthroscopy 09/25/17   . Aftercare following surgery Yes

## 2017-11-19 ENCOUNTER — Ambulatory Visit (INDEPENDENT_AMBULATORY_CARE_PROVIDER_SITE_OTHER): Payer: Medicare Other | Admitting: *Deleted

## 2017-11-19 DIAGNOSIS — Z7901 Long term (current) use of anticoagulants: Secondary | ICD-10-CM | POA: Diagnosis not present

## 2017-11-19 DIAGNOSIS — I4892 Unspecified atrial flutter: Secondary | ICD-10-CM | POA: Diagnosis not present

## 2017-11-19 LAB — POCT INR: INR: 1.1

## 2017-11-19 NOTE — Patient Instructions (Signed)
Restart coumadin 1 1/2 tablets daily except 1 tablet on Mondays, Wednesdays and Fridays Recheck in 2 weeks

## 2017-12-03 ENCOUNTER — Ambulatory Visit (INDEPENDENT_AMBULATORY_CARE_PROVIDER_SITE_OTHER): Payer: Medicare Other | Admitting: *Deleted

## 2017-12-03 ENCOUNTER — Other Ambulatory Visit (HOSPITAL_COMMUNITY): Payer: Self-pay | Admitting: Pulmonary Disease

## 2017-12-03 DIAGNOSIS — Z7901 Long term (current) use of anticoagulants: Secondary | ICD-10-CM

## 2017-12-03 DIAGNOSIS — R2242 Localized swelling, mass and lump, left lower limb: Secondary | ICD-10-CM

## 2017-12-03 DIAGNOSIS — I110 Hypertensive heart disease with heart failure: Secondary | ICD-10-CM | POA: Diagnosis not present

## 2017-12-03 DIAGNOSIS — I2510 Atherosclerotic heart disease of native coronary artery without angina pectoris: Secondary | ICD-10-CM | POA: Diagnosis not present

## 2017-12-03 DIAGNOSIS — E1121 Type 2 diabetes mellitus with diabetic nephropathy: Secondary | ICD-10-CM | POA: Diagnosis not present

## 2017-12-03 DIAGNOSIS — I482 Chronic atrial fibrillation: Secondary | ICD-10-CM | POA: Diagnosis not present

## 2017-12-03 DIAGNOSIS — I4892 Unspecified atrial flutter: Secondary | ICD-10-CM | POA: Diagnosis not present

## 2017-12-03 LAB — POCT INR: INR: 1.3

## 2017-12-03 NOTE — Patient Instructions (Signed)
Take coumadin 2 tablets tonight and tomorrow night then resume 1 1/2 tablets daily except 1 tablet on Mondays, Wednesdays and Fridays Recheck in 2 weeks

## 2017-12-05 ENCOUNTER — Ambulatory Visit (HOSPITAL_COMMUNITY)
Admission: RE | Admit: 2017-12-05 | Discharge: 2017-12-05 | Disposition: A | Discharge status: Home / Self Care | Payer: Medicare Other | Source: Ambulatory Visit | Attending: Pulmonary Disease | Admitting: Pulmonary Disease

## 2017-12-05 DIAGNOSIS — R600 Localized edema: Secondary | ICD-10-CM | POA: Diagnosis not present

## 2017-12-05 DIAGNOSIS — R2242 Localized swelling, mass and lump, left lower limb: Secondary | ICD-10-CM | POA: Diagnosis not present

## 2017-12-29 ENCOUNTER — Ambulatory Visit (INDEPENDENT_AMBULATORY_CARE_PROVIDER_SITE_OTHER): Payer: Medicare Other | Admitting: *Deleted

## 2017-12-29 DIAGNOSIS — I4892 Unspecified atrial flutter: Secondary | ICD-10-CM | POA: Diagnosis not present

## 2017-12-29 DIAGNOSIS — Z7901 Long term (current) use of anticoagulants: Secondary | ICD-10-CM | POA: Diagnosis not present

## 2017-12-29 LAB — POCT INR: INR: 2

## 2017-12-29 NOTE — Patient Instructions (Signed)
Continue coumadin 1 1/2 tablets daily except 1 tablet on Mondays, Wednesdays and Fridays Recheck in 3 weeks 

## 2018-01-06 ENCOUNTER — Ambulatory Visit: Payer: Medicare Other | Admitting: Adult Health

## 2018-01-19 ENCOUNTER — Ambulatory Visit (INDEPENDENT_AMBULATORY_CARE_PROVIDER_SITE_OTHER): Payer: Medicare Other | Admitting: *Deleted

## 2018-01-19 DIAGNOSIS — Z7901 Long term (current) use of anticoagulants: Secondary | ICD-10-CM | POA: Diagnosis not present

## 2018-01-19 DIAGNOSIS — I4892 Unspecified atrial flutter: Secondary | ICD-10-CM | POA: Diagnosis not present

## 2018-01-19 LAB — POCT INR: INR: 2.4

## 2018-01-19 NOTE — Patient Instructions (Signed)
Continue coumadin 1 1/2 tablets daily except 1 tablet on Mondays, Wednesdays and Fridays Recheck in 4 weeks 

## 2018-02-03 ENCOUNTER — Telehealth: Payer: Self-pay | Admitting: Orthopedic Surgery

## 2018-02-03 NOTE — Telephone Encounter (Signed)
Patient was asking if Dr. Aline Brochure would look her hip x-rays over. I explained to her that it had been awhile since they were done, so it maybe more beneficial for her to come in and see Dr. Aline Brochure. That way they can discuss her hip pain and what can be done. She was in agreement with the suggestion and has scheduled an appointment.

## 2018-02-16 ENCOUNTER — Ambulatory Visit (INDEPENDENT_AMBULATORY_CARE_PROVIDER_SITE_OTHER): Payer: Medicare Other | Admitting: *Deleted

## 2018-02-16 DIAGNOSIS — I4892 Unspecified atrial flutter: Secondary | ICD-10-CM | POA: Diagnosis not present

## 2018-02-16 DIAGNOSIS — Z7901 Long term (current) use of anticoagulants: Secondary | ICD-10-CM | POA: Diagnosis not present

## 2018-02-16 LAB — POCT INR: INR: 2.7

## 2018-02-16 NOTE — Patient Instructions (Signed)
Continue coumadin 1 1/2 tablets daily except 1 tablet on Mondays, Wednesdays and Fridays Recheck in 5 weeks

## 2018-02-23 ENCOUNTER — Encounter: Payer: Self-pay | Admitting: Orthopedic Surgery

## 2018-02-23 ENCOUNTER — Ambulatory Visit (INDEPENDENT_AMBULATORY_CARE_PROVIDER_SITE_OTHER): Payer: Medicare Other

## 2018-02-23 ENCOUNTER — Ambulatory Visit (INDEPENDENT_AMBULATORY_CARE_PROVIDER_SITE_OTHER): Payer: Medicare Other | Admitting: Orthopedic Surgery

## 2018-02-23 VITALS — BP 156/97 | HR 84 | Ht 65.0 in | Wt 178.0 lb

## 2018-02-23 DIAGNOSIS — S72145S Nondisplaced intertrochanteric fracture of left femur, sequela: Secondary | ICD-10-CM

## 2018-02-23 DIAGNOSIS — M7061 Trochanteric bursitis, right hip: Secondary | ICD-10-CM

## 2018-02-23 DIAGNOSIS — M25551 Pain in right hip: Secondary | ICD-10-CM | POA: Diagnosis not present

## 2018-02-23 DIAGNOSIS — Z9889 Other specified postprocedural states: Secondary | ICD-10-CM | POA: Diagnosis not present

## 2018-02-23 NOTE — Progress Notes (Signed)
Progress Note   Patient ID: [name], female   DOB: 05-06-1941, 77 y.o.   MRN: [id number] Chief Complaint  Patient presents with  . Follow-up    Recheck on right hip. Surgery in 2000.     Medical decision-making  Imaging:  Xrays ordered: y/n ? Y  Encounter Diagnoses  Name Primary?  . S/P left knee arthroscopy 09/25/17   . Hip pain, right   . Greater trochanteric bursitis of right hip Yes  . Closed nondisplaced intertrochanteric fracture of left femur, sequela       No orders of the defined types were placed in this encounter.   PLAN: Recommend injection right greater trochanter  Procedure note injection for   right hip bursitis  Verbal consent was obtained for injection of the right hip  Timeout was completed to confirm the injection site  The medications used were 40 mg of Depo-Medrol and 1% lidocaine 3 cc  Anesthesia was provided by ethyl chloride and the skin was prepped with alcohol.  After cleaning the skin with alcohol a 25-gauge needle was used to inject the right bursa of the hip   Return in 3 months   Arther Abbott, MD 02/23/2018 10:55 AM     Chief Complaint  Patient presents with  . Follow-up    Recheck on right hip. Surgery in 5479.    77 year old female had recent knee arthroscopy she is also status post open treatment internal fixation subtrochanteric fracture right hip with dynamic hip screw fixation and cerclage wire back in 2000.  She comes in today complaining of right sided hip pain which is nonradiating not associated with groin or thigh pain pain is over the greater trochanter and lower back it is associated with weightbearing worse with walking appears to be unassociated with pain at rest.  Pain is a dull ache    Review of Systems  Constitutional: Negative for chills and fever.  Musculoskeletal: Positive for back pain, joint pain and myalgias.  Neurological: Negative for tingling, focal weakness and weakness.   No  outpatient medications have been marked as taking for the 02/23/18 encounter (Office Visit) with Caitlin Civil, MD.    Past Medical History:  Diagnosis Date  . Arthritis   . Atrial flutter ([place])    typical appearing  . Bilateral pulmonary embolism ([place]) 08/2008   On coumadin  . Coronary artery disease    s/p DES to mid RCA in 02/2008; s/p inferior MI with DES to mid RCA and mid LAD in 05/2005  . Coronary atherosclerosis   . Diabetes mellitus without complication ([place])   . Hyperlipidemia   . Hypertension    Controlled  . Ischemic cardiomyopathy   . Memory disorder 07/08/2017  . Myocardial infarction ([place])   . Stroke ([place])   . Unspecified combined systolic and diastolic heart failure    Asymptomatic. Echo 04/28/13 EF= 40- 45%  . Unspecified combined systolic and diastolic heart failure      No Known Allergies  BP (!) 156/97   Pulse 84   Ht 5\' 5"  (1.651 m)   Wt 178 lb (80.7 kg)   BMI 29.62 kg/m    Physical Exam  Constitutional: She is oriented to person, place, and time. She appears well-developed and well-nourished.  Neurological: She is alert and oriented to person, place, and time.  Psychiatric: She has a normal mood and affect. Judgment normal.  Vitals reviewed.   Ortho Exam No tenderness in the lower back  Patient is [name]  with a cane in the left hand  She has tenderness over the right greater trochanter her skin incision shows no erythema she has painless range of motion of the right hip with flexion and has 0 degrees internal rotation with painful internal rotation of the hip on the right left hip flexion normal internal rotation 20 degrees no pain  Right hip stable  No atrophy is noted in the right leg she has no neurologic deficits in the right lower extremity pulse and perfusion are normal she ambulates with a cane in her left hand

## 2018-03-02 DIAGNOSIS — Z0000 Encounter for general adult medical examination without abnormal findings: Secondary | ICD-10-CM | POA: Diagnosis not present

## 2018-03-23 ENCOUNTER — Ambulatory Visit (INDEPENDENT_AMBULATORY_CARE_PROVIDER_SITE_OTHER): Payer: Medicare Other | Admitting: *Deleted

## 2018-03-23 DIAGNOSIS — Z7901 Long term (current) use of anticoagulants: Secondary | ICD-10-CM | POA: Diagnosis not present

## 2018-03-23 DIAGNOSIS — I4892 Unspecified atrial flutter: Secondary | ICD-10-CM | POA: Diagnosis not present

## 2018-03-23 LAB — POCT INR: INR: 1.2 — AB (ref 2.0–3.0)

## 2018-03-23 NOTE — Patient Instructions (Signed)
Take coumadin 2 tablets tonight and tomorrow night then resume 1 1/2 tablets daily except 1 tablet on Mondays, Wednesdays and Fridays Recheck in 2 weeks

## 2018-04-01 ENCOUNTER — Ambulatory Visit (INDEPENDENT_AMBULATORY_CARE_PROVIDER_SITE_OTHER): Payer: Medicare Other | Admitting: *Deleted

## 2018-04-01 DIAGNOSIS — I4892 Unspecified atrial flutter: Secondary | ICD-10-CM | POA: Diagnosis not present

## 2018-04-01 DIAGNOSIS — Z7901 Long term (current) use of anticoagulants: Secondary | ICD-10-CM

## 2018-04-01 LAB — POCT INR: INR: 2.3 (ref 2.0–3.0)

## 2018-04-01 NOTE — Patient Instructions (Signed)
Continue coumadin 1 1/2 tablets daily except 1 tablet on Mondays, Wednesdays and Fridays Recheck in 3 weeks 

## 2018-04-22 ENCOUNTER — Ambulatory Visit (INDEPENDENT_AMBULATORY_CARE_PROVIDER_SITE_OTHER): Payer: Medicare Other | Admitting: *Deleted

## 2018-04-22 DIAGNOSIS — I4892 Unspecified atrial flutter: Secondary | ICD-10-CM

## 2018-04-22 DIAGNOSIS — Z7901 Long term (current) use of anticoagulants: Secondary | ICD-10-CM

## 2018-04-22 LAB — POCT INR: INR: 2.1 (ref 2.0–3.0)

## 2018-04-22 NOTE — Patient Instructions (Signed)
Continue coumadin 1 1/2 tablets daily except 1 tablet on Mondays, Wednesdays and Fridays Recheck in 4 weeks

## 2018-05-06 ENCOUNTER — Other Ambulatory Visit (HOSPITAL_COMMUNITY): Payer: Self-pay | Admitting: Pulmonary Disease

## 2018-05-06 DIAGNOSIS — G8929 Other chronic pain: Secondary | ICD-10-CM

## 2018-05-06 DIAGNOSIS — M545 Low back pain: Principal | ICD-10-CM

## 2018-05-13 ENCOUNTER — Ambulatory Visit (HOSPITAL_COMMUNITY)
Admission: RE | Admit: 2018-05-13 | Discharge: 2018-05-13 | Disposition: A | Discharge status: Home / Self Care | Payer: Medicare Other | Source: Ambulatory Visit | Attending: Pulmonary Disease | Admitting: Pulmonary Disease

## 2018-05-13 DIAGNOSIS — M5126 Other intervertebral disc displacement, lumbar region: Secondary | ICD-10-CM | POA: Diagnosis not present

## 2018-05-13 DIAGNOSIS — M4807 Spinal stenosis, lumbosacral region: Secondary | ICD-10-CM | POA: Diagnosis not present

## 2018-05-13 DIAGNOSIS — M545 Low back pain, unspecified: Secondary | ICD-10-CM

## 2018-05-13 DIAGNOSIS — G8929 Other chronic pain: Secondary | ICD-10-CM

## 2018-05-20 ENCOUNTER — Ambulatory Visit (INDEPENDENT_AMBULATORY_CARE_PROVIDER_SITE_OTHER): Payer: Medicare Other | Admitting: *Deleted

## 2018-05-20 DIAGNOSIS — Z7901 Long term (current) use of anticoagulants: Secondary | ICD-10-CM | POA: Diagnosis not present

## 2018-05-20 DIAGNOSIS — I4892 Unspecified atrial flutter: Secondary | ICD-10-CM

## 2018-05-20 LAB — POCT INR: INR: 3.2 — AB (ref 2.0–3.0)

## 2018-05-20 NOTE — Patient Instructions (Signed)
Hold coumadin tonight then resume 1 1/2 tablets daily except 1 tablet on Mondays, Wednesdays and Fridays Recheck in 4 weeks

## 2018-05-22 ENCOUNTER — Ambulatory Visit (INDEPENDENT_AMBULATORY_CARE_PROVIDER_SITE_OTHER): Payer: Medicare Other | Admitting: Internal Medicine

## 2018-05-22 ENCOUNTER — Encounter: Payer: Self-pay | Admitting: *Deleted

## 2018-05-22 VITALS — BP 138/64 | HR 73 | Ht 65.0 in | Wt 179.0 lb

## 2018-05-22 DIAGNOSIS — I4892 Unspecified atrial flutter: Secondary | ICD-10-CM | POA: Diagnosis not present

## 2018-05-22 DIAGNOSIS — I10 Essential (primary) hypertension: Secondary | ICD-10-CM | POA: Diagnosis not present

## 2018-05-22 DIAGNOSIS — I2510 Atherosclerotic heart disease of native coronary artery without angina pectoris: Secondary | ICD-10-CM | POA: Diagnosis not present

## 2018-05-22 DIAGNOSIS — I6523 Occlusion and stenosis of bilateral carotid arteries: Secondary | ICD-10-CM

## 2018-05-22 MED ORDER — EVOLOCUMAB 140 MG/ML ~~LOC~~ SOAJ: 140.0000 mg | SUBCUTANEOUS | 11 refills | Status: DC

## 2018-05-22 NOTE — Progress Notes (Addendum)
Cardiology Office Note   Date:  05/22/2018   ID:  [name], DOB [birth year], MRN [id number]  PCP:  Sinda Du, MD  Cardiologist:   Dorris Carnes, MD    Pt presents as f//u of CAD     History of Present Illness: [name] is a 77 y.o. female with a history  CAD (stent in past 2006 and 2009, see below), intermittatrial flutter, HTN   I ssw her in 2018 Pt says her breathing is OK  Denies CP  Denies dizzienss  Notes occsional heart racing when she does thing like cleaning or vacuuming  R hip causing problem  Catches when moves   Follows by Dr Aline Brochure  (ortho)   Uses cane  Claims it hurts     Current Meds  Medication Sig  . amLODipine (NORVASC) 5 MG tablet Take 5 mg by mouth daily.  . Ascorbic Acid (VITAMIN C GUMMIES PO) Take 2 each by mouth daily. 180 mg  . aspirin EC 81 MG tablet Take 81 mg by mouth daily.  Marland Kitchen atorvastatin (LIPITOR) 10 MG tablet Take 10 mg by mouth daily.  . Coenzyme Q10 (CO Q 10) 100 MG CAPS Take 100 mg by mouth daily.  Marland Kitchen donepezil (ARICEPT) 5 MG tablet Take 1 tablet (5 mg total) by mouth at bedtime.  . enalapril (VASOTEC) 10 MG tablet Take 1 tablet (10 mg total) by mouth 2 (two) times daily.  . hydrochlorothiazide (HYDRODIURIL) 25 MG tablet Take 25 mg by mouth daily.  Marland Kitchen MEGARED OMEGA-3 KRILL OIL PO Take 1,000 mg by mouth daily. When she remembers  . metFORMIN (GLUCOPHAGE) 500 MG tablet Take 1 tablet (500 mg total) by mouth 2 (two) times daily with a meal. (Patient taking differently: Take 1,000 mg by mouth 2 (two) times daily with a meal. )  . metoprolol (LOPRESSOR) 50 MG tablet Take 50 mg by mouth 2 (two) times daily.  . nitroGLYCERIN (NITROSTAT) 0.4 MG SL tablet Place 0.4 mg under the tongue every 5 (five) minutes x 3 doses as needed for chest pain.   Marland Kitchen warfarin (COUMADIN) 5 MG tablet Take 5-7.5 mg by mouth daily at 6 PM. Takes 7.5 mg every day except on Wednesday and Saturdays takes 5 mg     Allergies:   Patient has no known allergies.   Past  Medical History:  Diagnosis Date  . Arthritis   . Atrial flutter ([place])    typical appearing  . Bilateral pulmonary embolism ([place]) 08/2008   On coumadin  . Coronary artery disease    s/p DES to mid RCA in 02/2008; s/p inferior MI with DES to mid RCA and mid LAD in 05/2005  . Coronary atherosclerosis   . Diabetes mellitus without complication ([place])   . Hyperlipidemia   . Hypertension    Controlled  . Ischemic cardiomyopathy   . Memory disorder 07/08/2017  . Myocardial infarction ([place])   . Stroke ([place])   . Unspecified combined systolic and diastolic heart failure    Asymptomatic. Echo 04/28/13 EF= 40- 45%  . Unspecified combined systolic and diastolic heart failure     Past Surgical History:  Procedure Laterality Date  . ATRIAL FLUTTER ABLATION N/A 06/17/2013   Procedure: ATRIAL FLUTTER ABLATION;  Surgeon: Thompson Grayer, MD;  [facility]: [facility] CATH LAB;  Service: Cardiovascular;  Laterality: N/A;  . CAROTID PTA/STENT INTERVENTION N/A 04/10/2017   Procedure: Carotid PTA/Stent Intervention;  Surgeon: Algernon Huxley, MD;  Location: [facility] CV LAB;  Service: Cardiovascular;  Laterality: N/A;  . CHOLECYSTECTOMY    . HIP SURGERY    . KNEE ARTHROSCOPY WITH MEDIAL MENISECTOMY Left 09/25/2017   Procedure: KNEE ARTHROSCOPY WITH MEDIAL AND LATERAL MENISECTOMY;  Surgeon: [name], MD;  Location: [facility] ORS;  Service: Orthopedics;  Laterality: Left;     Social History:  The patient  reports that she quit smoking about 39 years ago. Her smoking use included cigarettes. She quit after 10.00 years of use. She has never used smokeless tobacco. She reports that she does not drink alcohol or use drugs.   Family History:  The patient's family history includes Arthritis in her unknown relative; Cancer in her unknown relative; Diabetes in her unknown relative; Heart disease in her unknown relative; Heart failure in her mother.    ROS:  Please see the history of present illness. All other systems are  reviewed and  Negative to the above problem except as noted.    PHYSICAL EXAM: VS:  BP 138/64   Pulse 73   Ht 5\' 5"  (1.651 m)   Wt 179 lb (81.2 kg)   SpO2 95%   BMI 29.79 kg/m   GEN: Well nourished, well developed, in no acute distress   Examined in chair   HEENT: normal  Neck:JVP is normal   Cardiac: RRR; no murmurs, rubs, or gallops,no edema  Respiratory:  clear to auscultation bilaterally, normal work of breathing GI: soft, nontender, nondistended, + BS  No hepatomegaly  MS: no deformity Moving all extremities   Skin: warm and dry, no rash Neuro:  Strength and sensation are intact Psych: euthymic mood, full affect   EKG:  EKG is ordered today.  SR 75   Low voltage  Occasoinal PVC    Lipid Panel    Component Value Date/Time   CHOL 149 03/26/2017 0549   TRIG 98 03/26/2017 0549   HDL 50 03/26/2017 0549   CHOLHDL 3.0 03/26/2017 0549   VLDL 20 03/26/2017 0549   LDLCALC 79 03/26/2017 0549      Wt Readings from Last 3 Encounters:  05/22/18 179 lb (81.2 kg)  02/23/18 178 lb (80.7 kg)  11/07/17 178 lb (80.7 kg)      ASSESSMENT AND PLAN:  1  CAD  Remote interventions   Pt without complaints of angina  2  HTN  BPis adequately controlled  3   CV dz   Will set up for carotid USN to follow    4    Intermitt atrial flutter   Remains in SR   Cont coumadin  5  HL Will get lipid from Dr Luan Pulling     Tentatively plan to f/u in 12 months   SOoner for problems     Current medicines are reviewed at length with the patient today.  The patient does not have concerns regarding medicines.  Signed, Dorris Carnes, MD  05/22/2018 2:24 PM    [address], [place], [place]  [address] Phone: [phone]; Fax: [phone]

## 2018-05-22 NOTE — Patient Instructions (Addendum)
  Your physician wants you to follow-up in: 1 year with Dr. Theressa Stamps will receive a reminder letter in the mail two months in advance. If you don't receive a letter, please call our office to schedule the follow-up appointment.   Your physician recommends that you continue on your current medications as directed. Please refer to the Current Medication list given to you today.    If you need a refill on your cardiac medications before your next appointment, please call your pharmacy.    Your physician has requested that you have a carotid duplex. This test is an ultrasound of the carotid arteries in your neck. It looks at blood flow through these arteries that supply the brain with blood. Allow one hour for this exam. There are no restrictions or special instructions.     No lab work today      Thank you for [place] !

## 2018-05-27 ENCOUNTER — Ambulatory Visit: Payer: Medicare Other | Admitting: Orthopedic Surgery

## 2018-06-02 DIAGNOSIS — N183 Chronic kidney disease, stage 3 (moderate): Secondary | ICD-10-CM | POA: Diagnosis not present

## 2018-06-02 DIAGNOSIS — I2510 Atherosclerotic heart disease of native coronary artery without angina pectoris: Secondary | ICD-10-CM | POA: Diagnosis not present

## 2018-06-02 DIAGNOSIS — E1121 Type 2 diabetes mellitus with diabetic nephropathy: Secondary | ICD-10-CM | POA: Diagnosis not present

## 2018-06-02 DIAGNOSIS — I129 Hypertensive chronic kidney disease with stage 1 through stage 4 chronic kidney disease, or unspecified chronic kidney disease: Secondary | ICD-10-CM | POA: Diagnosis not present

## 2018-06-03 ENCOUNTER — Ambulatory Visit: Payer: Medicare Other | Admitting: Orthopedic Surgery

## 2018-06-24 ENCOUNTER — Ambulatory Visit (INDEPENDENT_AMBULATORY_CARE_PROVIDER_SITE_OTHER): Payer: Medicare Other | Admitting: *Deleted

## 2018-06-24 DIAGNOSIS — Z7901 Long term (current) use of anticoagulants: Secondary | ICD-10-CM

## 2018-06-24 DIAGNOSIS — I4892 Unspecified atrial flutter: Secondary | ICD-10-CM | POA: Diagnosis not present

## 2018-06-24 LAB — POCT INR: INR: 3.3 — AB (ref 2.0–3.0)

## 2018-06-24 NOTE — Patient Instructions (Addendum)
Take coumadin 1/2 tablet  tonight then decrease dose to 1 tablet daily except 1 1/2 tablets on Tuesdays and Saturdays Recheck in 3 weeks

## 2018-07-03 DIAGNOSIS — M5126 Other intervertebral disc displacement, lumbar region: Secondary | ICD-10-CM | POA: Diagnosis not present

## 2018-07-06 ENCOUNTER — Encounter: Payer: Self-pay | Admitting: Orthopedic Surgery

## 2018-07-15 ENCOUNTER — Ambulatory Visit (INDEPENDENT_AMBULATORY_CARE_PROVIDER_SITE_OTHER): Payer: Medicare Other | Admitting: *Deleted

## 2018-07-15 ENCOUNTER — Ambulatory Visit (INDEPENDENT_AMBULATORY_CARE_PROVIDER_SITE_OTHER): Payer: Medicare Other | Admitting: Orthopedic Surgery

## 2018-07-15 ENCOUNTER — Ambulatory Visit (INDEPENDENT_AMBULATORY_CARE_PROVIDER_SITE_OTHER): Payer: Medicare Other

## 2018-07-15 VITALS — BP 135/85 | HR 72 | Ht 65.0 in | Wt 179.0 lb

## 2018-07-15 DIAGNOSIS — I6523 Occlusion and stenosis of bilateral carotid arteries: Secondary | ICD-10-CM | POA: Diagnosis not present

## 2018-07-15 DIAGNOSIS — M1611 Unilateral primary osteoarthritis, right hip: Secondary | ICD-10-CM

## 2018-07-15 DIAGNOSIS — Z7901 Long term (current) use of anticoagulants: Secondary | ICD-10-CM

## 2018-07-15 DIAGNOSIS — I4892 Unspecified atrial flutter: Secondary | ICD-10-CM

## 2018-07-15 LAB — POCT INR: INR: 1.6 — AB (ref 2.0–3.0)

## 2018-07-15 NOTE — Progress Notes (Signed)
Progress Note   Patient ID: [name], female   DOB: 31-Jan-1941, 77 y.o.   MRN: [id number]   Chief Complaint  Patient presents with  . Follow-up    3 month recheck on right hip.    HPI The patient presents for evaluation of right hip.  In review about 20 years ago she had a inotrope fracture of the right hip she was treated with a long compression screw and sideplate.  At the time of surgery she had arthritis in the hip and over the 20 years this is progressively worsened.  She also has degenerative disc disease in her lumbar spine.  She complains of right hip pain located over the right buttock.  She specifically on questioning denies groin pain or thigh or knee pain.  She does walk with a cane probably needs a walker but she is ambulatory and does not have symptoms related to the hip joint.  Location right gluteal region Duration several years worsening in the last several months Quality dull ache Severity moderate Associated with trouble walking  Review of Systems  Respiratory: Negative for shortness of breath.   Cardiovascular: Negative for chest pain.  Musculoskeletal: Positive for back pain and joint pain.   No outpatient medications have been marked as taking for the 07/15/18 encounter (Office Visit) with [name], MD.    Past Medical History:  Diagnosis Date  . Arthritis   . Atrial flutter ([place])    typical appearing  . Bilateral pulmonary embolism ([place]) 08/2008   On coumadin  . Coronary artery disease    s/p DES to mid RCA in 02/2008; s/p inferior MI with DES to mid RCA and mid LAD in 05/2005  . Coronary atherosclerosis   . Diabetes mellitus without complication ([place])   . Hyperlipidemia   . Hypertension    Controlled  . Ischemic cardiomyopathy   . Memory disorder 07/08/2017  . Myocardial infarction ([place])   . Stroke ([place])   . Unspecified combined systolic and diastolic heart failure    Asymptomatic. Echo 04/28/13 EF= 40- 45%  . Unspecified  combined systolic and diastolic heart failure      No Known Allergies   BP 135/85   Pulse 72   Ht 5\' 5"  (1.651 m)   Wt 179 lb (81.2 kg)   BMI 29.79 kg/m    Physical Exam General appearance normal Oriented x3 normal Mood pleasant affect normal Gait supported by a cane favors the right lower extremity has pelvic obliquity  Ortho Exam Left lower extremity and left hip  inspection and palpation revealed no abnormalities Range of motion is full No instability was detected on stress testing Muscle tone and strength was normal without tremor Skin was warm dry and intact Good pulse and temperature were noted in the extremity Sensation revealed no abnormalities to light touch  Right lower extremity right hip No tenderness in the groin or greater trochanter but tenderness in the right SI joint and right gluteal area Hip range of motion 120 degrees of flexion 0 degrees internal rotation 15 degrees external rotation crepitance without pain Hip is stable Muscle tone is normal hip flexion is weak grade 4 Skin incision from the hip surgery is normal Distal pulses are intact temperature is normal No gross sensory abnormalities   MEDICAL DECISION MAKING   Imaging:  First images x-ray that was taken today that x-ray shows arthritis of the right hip grade 3 long compression screw and sideplate intact no arthritis on the left  hip  She fortunately had an MRI which was done on May 12, 2018 she has an L5-S1 medium sized central disc extrusion with inferior migration of the exerts mass-effect on both descending S1 root and probably also left S2 she also has an L4-5 central disc extrusion with inferior migration causing mass-effect on right L5 nerve root in the lateral recess.  I think this is a source of her pain as she does not have any groin symptoms even with weightbearing which is surprising because x-ray looks so bad  I showed the x-rays to [name] and her daughter.  I told him I  do not think hip replacement will help her buttock pain although it may help her walk better.  It is also a major undertaking to get this plate out of there is a longstem prosthesis and try to put a hip replacement in.  They agree.  For now we are going to try epidural injection     Encounter Diagnosis  Name Primary?  . Primary osteoarthritis of right hip Yes     No orders of the defined types were placed in this encounter.  5:11 PM 07/15/2018

## 2018-07-15 NOTE — Patient Instructions (Signed)
Pt states she is scheduled for epidural injection of back on 07/21/18 Pt is going to call Dr Roy's office today to report INR and find out when she needs to stop coumadin. Follow Dr Rex Kras office instructions. Increase coumadin to 1 tablet daily except 1 1/2 tablets on Tuesdays, Thursdays and Saturdays Recheck in 2 weeks

## 2018-07-28 ENCOUNTER — Encounter: Payer: Self-pay | Admitting: Gastroenterology

## 2018-07-29 ENCOUNTER — Ambulatory Visit (INDEPENDENT_AMBULATORY_CARE_PROVIDER_SITE_OTHER): Payer: Medicare Other | Admitting: *Deleted

## 2018-07-29 DIAGNOSIS — Z7901 Long term (current) use of anticoagulants: Secondary | ICD-10-CM | POA: Diagnosis not present

## 2018-07-29 DIAGNOSIS — I4892 Unspecified atrial flutter: Secondary | ICD-10-CM | POA: Diagnosis not present

## 2018-07-29 LAB — POCT INR: INR: 2.1 (ref 2.0–3.0)

## 2018-07-29 NOTE — Patient Instructions (Signed)
Continue coumadin 1 tablet daily except 1 1/2 tablets on Tuesdays, Thursdays and Saturdays Recheck in 3 weeks

## 2018-08-18 ENCOUNTER — Telehealth: Payer: Self-pay | Admitting: Orthopedic Surgery

## 2018-08-18 DIAGNOSIS — M1712 Unilateral primary osteoarthritis, left knee: Secondary | ICD-10-CM

## 2018-08-18 NOTE — Telephone Encounter (Signed)
Printed order, will call when Dr Aline Brochure has signed

## 2018-08-18 NOTE — Telephone Encounter (Signed)
Patient needs prescription rewritten for walker due to original being misplaced. Patient's daughter is aware that you will call when it is ready.  [phone]

## 2018-08-19 NOTE — Telephone Encounter (Signed)
I have called to advise order at front desk for pick up

## 2018-08-24 ENCOUNTER — Ambulatory Visit (INDEPENDENT_AMBULATORY_CARE_PROVIDER_SITE_OTHER): Payer: Medicare Other | Admitting: *Deleted

## 2018-08-24 DIAGNOSIS — Z7901 Long term (current) use of anticoagulants: Secondary | ICD-10-CM | POA: Diagnosis not present

## 2018-08-24 DIAGNOSIS — I4892 Unspecified atrial flutter: Secondary | ICD-10-CM | POA: Diagnosis not present

## 2018-08-24 LAB — POCT INR: INR: 2.3 (ref 2.0–3.0)

## 2018-08-24 NOTE — Patient Instructions (Signed)
Continue coumadin 1 tablet daily except 1 1/2 tablets on Tuesdays, Thursdays and Saturdays Recheck in 4 weeks 

## 2018-09-01 DIAGNOSIS — I129 Hypertensive chronic kidney disease with stage 1 through stage 4 chronic kidney disease, or unspecified chronic kidney disease: Secondary | ICD-10-CM | POA: Diagnosis not present

## 2018-09-01 DIAGNOSIS — E1121 Type 2 diabetes mellitus with diabetic nephropathy: Secondary | ICD-10-CM | POA: Diagnosis not present

## 2018-09-01 DIAGNOSIS — I482 Chronic atrial fibrillation, unspecified: Secondary | ICD-10-CM | POA: Diagnosis not present

## 2018-09-01 DIAGNOSIS — Z23 Encounter for immunization: Secondary | ICD-10-CM | POA: Diagnosis not present

## 2018-09-01 DIAGNOSIS — I2510 Atherosclerotic heart disease of native coronary artery without angina pectoris: Secondary | ICD-10-CM | POA: Diagnosis not present

## 2018-09-21 ENCOUNTER — Ambulatory Visit (INDEPENDENT_AMBULATORY_CARE_PROVIDER_SITE_OTHER): Payer: Medicare Other | Admitting: *Deleted

## 2018-09-21 DIAGNOSIS — Z7901 Long term (current) use of anticoagulants: Secondary | ICD-10-CM | POA: Diagnosis not present

## 2018-09-21 DIAGNOSIS — I4892 Unspecified atrial flutter: Secondary | ICD-10-CM | POA: Diagnosis not present

## 2018-09-21 LAB — POCT INR: INR: 2.5 (ref 2.0–3.0)

## 2018-09-21 NOTE — Patient Instructions (Signed)
Continue coumadin 1 tablet daily except 1 1/2 tablets on Tuesdays, Thursdays and Saturdays Recheck in 6 weeks 

## 2018-11-02 ENCOUNTER — Ambulatory Visit (INDEPENDENT_AMBULATORY_CARE_PROVIDER_SITE_OTHER): Payer: Medicare Other | Admitting: Pharmacist

## 2018-11-02 DIAGNOSIS — Z7901 Long term (current) use of anticoagulants: Secondary | ICD-10-CM | POA: Diagnosis not present

## 2018-11-02 DIAGNOSIS — I4892 Unspecified atrial flutter: Secondary | ICD-10-CM

## 2018-11-02 LAB — POCT INR: INR: 1.8 — AB (ref 2.0–3.0)

## 2018-11-02 NOTE — Patient Instructions (Signed)
Description   Take 1.5 tablets today then continue coumadin 1 tablet daily except 1 1/2 tablets on Tuesdays, Thursdays and Saturdays Recheck in 4 weeks

## 2018-11-30 ENCOUNTER — Ambulatory Visit (INDEPENDENT_AMBULATORY_CARE_PROVIDER_SITE_OTHER): Payer: Medicare Other | Admitting: Pharmacist

## 2018-11-30 DIAGNOSIS — I4892 Unspecified atrial flutter: Secondary | ICD-10-CM

## 2018-11-30 DIAGNOSIS — Z7901 Long term (current) use of anticoagulants: Secondary | ICD-10-CM | POA: Diagnosis not present

## 2018-11-30 LAB — POCT INR: INR: 1.7 — AB (ref 2.0–3.0)

## 2018-11-30 NOTE — Patient Instructions (Signed)
Description   Take 1.5 tablets today then continue coumadin 1 tablet daily except 1 1/2 tablets on Tuesdays, Thursdays and Saturdays Recheck in 4 weeks

## 2018-12-30 ENCOUNTER — Other Ambulatory Visit: Payer: Self-pay

## 2018-12-30 ENCOUNTER — Ambulatory Visit (INDEPENDENT_AMBULATORY_CARE_PROVIDER_SITE_OTHER): Payer: Medicare Other | Admitting: *Deleted

## 2018-12-30 DIAGNOSIS — Z7901 Long term (current) use of anticoagulants: Secondary | ICD-10-CM | POA: Diagnosis not present

## 2018-12-30 DIAGNOSIS — I4892 Unspecified atrial flutter: Secondary | ICD-10-CM | POA: Diagnosis not present

## 2018-12-30 LAB — POCT INR: INR: 1.4 — AB (ref 2.0–3.0)

## 2018-12-30 NOTE — Patient Instructions (Signed)
Take 2 tablets tonight and tomorrow night then resume 1 tablet daily except 1 1/2 tablets on Tuesdays, Thursdays and Saturdays Recheck in 3 weeks

## 2019-01-22 ENCOUNTER — Telehealth: Payer: Self-pay | Admitting: *Deleted

## 2019-01-22 NOTE — Telephone Encounter (Signed)
° °  COVID-19 Pre-Screening Questions: ° °• Do you currently have a fever?NO ° ° °• Have you recently travelled on a cruise, internationally, or to [place], [place], [place], [place], [place], or [place], [place] ([employer]) ? NO °•  °• Have you been in contact with someone that is currently pending confirmation of Covid19 testing or has been confirmed to have the Covid19 virus?  NO °•  °Are you currently experiencing fatigue or cough? NO ° ° °   ° ° ° ° °

## 2019-01-25 ENCOUNTER — Ambulatory Visit (INDEPENDENT_AMBULATORY_CARE_PROVIDER_SITE_OTHER): Payer: Medicare Other | Admitting: *Deleted

## 2019-01-25 DIAGNOSIS — Z7901 Long term (current) use of anticoagulants: Secondary | ICD-10-CM | POA: Diagnosis not present

## 2019-01-25 DIAGNOSIS — I4892 Unspecified atrial flutter: Secondary | ICD-10-CM

## 2019-01-25 LAB — POCT INR: INR: 1.5 — AB (ref 2.0–3.0)

## 2019-01-25 NOTE — Patient Instructions (Signed)
Take 2 tablets tonight and tomorrow night then increase dose to 1 1/2 tablets on Mondays, Wednesdays and Fridays Recheck in 3 weeks

## 2019-02-07 ENCOUNTER — Encounter: Payer: Self-pay | Admitting: Orthopedic Surgery

## 2019-02-17 ENCOUNTER — Ambulatory Visit (INDEPENDENT_AMBULATORY_CARE_PROVIDER_SITE_OTHER): Payer: Medicare Other | Admitting: *Deleted

## 2019-02-17 ENCOUNTER — Other Ambulatory Visit: Payer: Self-pay

## 2019-02-17 DIAGNOSIS — Z7901 Long term (current) use of anticoagulants: Secondary | ICD-10-CM | POA: Diagnosis not present

## 2019-02-17 DIAGNOSIS — I4892 Unspecified atrial flutter: Secondary | ICD-10-CM | POA: Diagnosis not present

## 2019-02-17 LAB — POCT INR: INR: 2.4 (ref 2.0–3.0)

## 2019-02-17 NOTE — Patient Instructions (Signed)
Continue coumadin 1 1/2 tablets daily except 1 tablet on Mondays, Wednesdays and Fridays Recheck in 3 weeks

## 2019-02-23 ENCOUNTER — Telehealth: Payer: Self-pay | Admitting: Orthopedic Surgery

## 2019-02-23 NOTE — Telephone Encounter (Signed)
Patient called with questions about her hospital bill. She states she had spoken with you about this and needed to speak with you again. She states it has something to do with incorrect coding. She has received a collection call about this late yesterday. She is asking if you would please give her a call when you have time. She has Medicare and another insurance that did not pay on something because of the coding.  [phone]

## 2019-02-23 NOTE — Telephone Encounter (Signed)
Send to [employer] to ck into this

## 2019-03-10 ENCOUNTER — Ambulatory Visit (INDEPENDENT_AMBULATORY_CARE_PROVIDER_SITE_OTHER): Payer: Medicare Other | Admitting: *Deleted

## 2019-03-10 DIAGNOSIS — Z7901 Long term (current) use of anticoagulants: Secondary | ICD-10-CM | POA: Diagnosis not present

## 2019-03-10 DIAGNOSIS — I4892 Unspecified atrial flutter: Secondary | ICD-10-CM

## 2019-03-10 LAB — POCT INR: INR: 2.3 (ref 2.0–3.0)

## 2019-03-10 NOTE — Patient Instructions (Signed)
Continue coumadin 1 1/2 tablets daily except 1 tablet on Mondays, Wednesdays and Fridays Recheck in 4 weeks

## 2019-04-07 ENCOUNTER — Ambulatory Visit (INDEPENDENT_AMBULATORY_CARE_PROVIDER_SITE_OTHER): Payer: Medicare Other | Admitting: *Deleted

## 2019-04-07 DIAGNOSIS — Z7901 Long term (current) use of anticoagulants: Secondary | ICD-10-CM

## 2019-04-07 DIAGNOSIS — I4892 Unspecified atrial flutter: Secondary | ICD-10-CM

## 2019-04-07 LAB — POCT INR: INR: 2.5 (ref 2.0–3.0)

## 2019-04-07 NOTE — Patient Instructions (Signed)
Continue coumadin 1 1/2 tablets daily except 1 tablet on Mondays, Wednesdays and Fridays Recheck in 4 weeks

## 2019-05-05 ENCOUNTER — Ambulatory Visit (INDEPENDENT_AMBULATORY_CARE_PROVIDER_SITE_OTHER): Payer: Medicare Other | Admitting: *Deleted

## 2019-05-05 DIAGNOSIS — I4892 Unspecified atrial flutter: Secondary | ICD-10-CM

## 2019-05-05 DIAGNOSIS — Z5181 Encounter for therapeutic drug level monitoring: Secondary | ICD-10-CM

## 2019-05-05 LAB — POCT INR: INR: 3 (ref 2.0–3.0)

## 2019-05-05 NOTE — Patient Instructions (Signed)
Continue coumadin 1 1/2 tablets daily except 1 tablet on Mondays, Wednesdays and Fridays Recheck in 4 weeks

## 2019-05-13 DIAGNOSIS — I129 Hypertensive chronic kidney disease with stage 1 through stage 4 chronic kidney disease, or unspecified chronic kidney disease: Secondary | ICD-10-CM | POA: Diagnosis not present

## 2019-05-13 DIAGNOSIS — E1121 Type 2 diabetes mellitus with diabetic nephropathy: Secondary | ICD-10-CM | POA: Diagnosis not present

## 2019-05-13 DIAGNOSIS — I482 Chronic atrial fibrillation, unspecified: Secondary | ICD-10-CM | POA: Diagnosis not present

## 2019-05-13 DIAGNOSIS — I2510 Atherosclerotic heart disease of native coronary artery without angina pectoris: Secondary | ICD-10-CM | POA: Diagnosis not present

## 2019-06-09 ENCOUNTER — Ambulatory Visit (INDEPENDENT_AMBULATORY_CARE_PROVIDER_SITE_OTHER): Payer: Medicare Other | Admitting: *Deleted

## 2019-06-09 DIAGNOSIS — Z5181 Encounter for therapeutic drug level monitoring: Secondary | ICD-10-CM

## 2019-06-09 DIAGNOSIS — I4892 Unspecified atrial flutter: Secondary | ICD-10-CM

## 2019-06-09 LAB — POCT INR: INR: 2.2 (ref 2.0–3.0)

## 2019-06-09 NOTE — Patient Instructions (Signed)
Continue coumadin 1 1/2 tablets daily except 1 tablet on Mondays, Wednesdays and Fridays Recheck in 6 weeks

## 2019-06-10 ENCOUNTER — Ambulatory Visit: Payer: Medicare Other | Admitting: Internal Medicine

## 2019-06-10 NOTE — Progress Notes (Deleted)
Cardiology Office Note   Date:  06/10/2019   ID:  [name], DOB [birth year], MRN [id number]  PCP:  Sinda Du, MD  Cardiologist:   Dorris Carnes, MD    Pt presents as f//u of CAD     History of Present Illness: [name] is a 78 y.o. female with a history  CAD (stent in past 2006 and 2009, see below), intermittatrial flutter, HTN   I ssw her in 2019    No outpatient medications have been marked as taking for the 06/10/19 encounter (Appointment) with Fay Records, MD.     Allergies:   Patient has no known allergies.   Past Medical History:  Diagnosis Date  . Arthritis   . Atrial flutter ([place])    typical appearing  . Bilateral pulmonary embolism ([place]) 08/2008   On coumadin  . Coronary artery disease    s/p DES to mid RCA in 02/2008; s/p inferior MI with DES to mid RCA and mid LAD in 05/2005  . Coronary atherosclerosis   . Diabetes mellitus without complication ([place])   . Hyperlipidemia   . Hypertension    Controlled  . Ischemic cardiomyopathy   . Memory disorder 07/08/2017  . Myocardial infarction ([place])   . Stroke ([place])   . Unspecified combined systolic and diastolic heart failure    Asymptomatic. Echo 04/28/13 EF= 40- 45%  . Unspecified combined systolic and diastolic heart failure     Past Surgical History:  Procedure Laterality Date  . ATRIAL FLUTTER ABLATION N/A 06/17/2013   Procedure: ATRIAL FLUTTER ABLATION;  Surgeon: Thompson Grayer, MD;  Location: [facility] CATH LAB;  Service: Cardiovascular;  Laterality: N/A;  . CAROTID PTA/STENT INTERVENTION N/A 04/10/2017   Procedure: Carotid PTA/Stent Intervention;  Surgeon: Algernon Huxley, MD;  Location: [facility] CV LAB;  Service: Cardiovascular;  Laterality: N/A;  . CHOLECYSTECTOMY    . HIP SURGERY    . KNEE ARTHROSCOPY WITH MEDIAL MENISECTOMY Left 09/25/2017   Procedure: KNEE ARTHROSCOPY WITH MEDIAL AND LATERAL MENISECTOMY;  Surgeon: [name], MD;  Location: [facility] ORS;  Service: Orthopedics;   Laterality: Left;     Social History:  The patient  reports that she quit smoking about 40 years ago. Her smoking use included cigarettes. She quit after 10.00 years of use. She has never used smokeless tobacco. She reports that she does not drink alcohol or use drugs.   Family History:  The patient's family history includes Arthritis in her unknown relative; Cancer in her unknown relative; Diabetes in her unknown relative; Heart disease in her unknown relative; Heart failure in her mother.    ROS:  Please see the history of present illness. All other systems are reviewed and  Negative to the above problem except as noted.    PHYSICAL EXAM: VS:  There were no vitals taken for this visit.  GEN: Well nourished, well developed, in no acute distress   Examined in chair   HEENT: normal  Neck:JVP is normal   Cardiac: RRR; no murmurs, rubs, or gallops,no edema  Respiratory:  clear to auscultation bilaterally, normal work of breathing GI: soft, nontender, nondistended, + BS  No hepatomegaly  MS: no deformity Moving all extremities   Skin: warm and dry, no rash Neuro:  Strength and sensation are intact Psych: euthymic mood, full affect   EKG:  EKG is ordered today.  SR 75   Low voltage  Occasoinal PVC    Lipid Panel    Component Value Date/Time  CHOL 149 03/26/2017 0549   TRIG 98 03/26/2017 0549   HDL 50 03/26/2017 0549   CHOLHDL 3.0 03/26/2017 0549   VLDL 20 03/26/2017 0549   LDLCALC 79 03/26/2017 0549      Wt Readings from Last 3 Encounters:  07/15/18 179 lb (81.2 kg)  05/22/18 179 lb (81.2 kg)  02/23/18 178 lb (80.7 kg)      ASSESSMENT AND PLAN:  1  CAD  Remote interventions   Pt without complaints of angina  2  HTN  BPis adequately controlled  3   CV dz   Will set up for carotid USN to follow    4    Intermitt atrial flutter   Remains in SR   Cont coumadin  5  HL Will get lipid from Dr Luan Pulling     Tentatively plan to f/u in 12 months   SOoner for problems      Current medicines are reviewed at length with the patient today.  The patient does not have concerns regarding medicines.  Signed, Dorris Carnes, MD  06/10/2019 9:30 AM    [address], [place], [place]  [address] Phone: [phone]; Fax: [phone]

## 2019-07-21 ENCOUNTER — Other Ambulatory Visit: Payer: Self-pay

## 2019-07-21 ENCOUNTER — Ambulatory Visit (INDEPENDENT_AMBULATORY_CARE_PROVIDER_SITE_OTHER): Payer: Medicare Other | Admitting: *Deleted

## 2019-07-21 DIAGNOSIS — Z5181 Encounter for therapeutic drug level monitoring: Secondary | ICD-10-CM | POA: Diagnosis not present

## 2019-07-21 DIAGNOSIS — I4892 Unspecified atrial flutter: Secondary | ICD-10-CM | POA: Diagnosis not present

## 2019-07-21 LAB — POCT INR: INR: 1.9 — AB (ref 2.0–3.0)

## 2019-07-21 NOTE — Patient Instructions (Signed)
Take coumadin 1 1/2 tablets tonight then resume 1 1/2 tablets daily except 1 tablet on Mondays, Wednesdays and Fridays Recheck in 6 weeks

## 2019-07-22 DIAGNOSIS — Z23 Encounter for immunization: Secondary | ICD-10-CM | POA: Diagnosis not present

## 2019-08-23 ENCOUNTER — Telehealth: Payer: Self-pay | Admitting: Pharmacist

## 2019-08-23 NOTE — Telephone Encounter (Signed)
Called pt to discuss changing from warfarin to [place] therapy due to improved efficacy, side effect profile, and decreased in-office monitoring in light of the COVID-019 pandemic.  Pt states her husband takes Eliquis but she prefers to continue on warfarin therapy and will let us know if she changes her mind.

## 2019-09-01 ENCOUNTER — Ambulatory Visit (INDEPENDENT_AMBULATORY_CARE_PROVIDER_SITE_OTHER): Payer: Medicare Other | Admitting: *Deleted

## 2019-09-01 ENCOUNTER — Other Ambulatory Visit: Payer: Self-pay

## 2019-09-01 DIAGNOSIS — Z5181 Encounter for therapeutic drug level monitoring: Secondary | ICD-10-CM

## 2019-09-01 DIAGNOSIS — I4892 Unspecified atrial flutter: Secondary | ICD-10-CM

## 2019-09-01 LAB — POCT INR: INR: 5.6 — AB (ref 2.0–3.0)

## 2019-09-01 NOTE — Patient Instructions (Signed)
Hold coumadin tonight and tomorrow night then resume 1 1/2 tablets daily except 1 tablet on Mondays, Wednesdays and Fridays Recheck in 10 days Bleeding and fall precautions discussed with pt and she verbalized understanding.

## 2019-09-08 ENCOUNTER — Other Ambulatory Visit: Payer: Self-pay

## 2019-09-13 ENCOUNTER — Other Ambulatory Visit: Payer: Self-pay

## 2019-09-13 ENCOUNTER — Ambulatory Visit (INDEPENDENT_AMBULATORY_CARE_PROVIDER_SITE_OTHER): Payer: Medicare Other | Admitting: *Deleted

## 2019-09-13 DIAGNOSIS — I4892 Unspecified atrial flutter: Secondary | ICD-10-CM | POA: Diagnosis not present

## 2019-09-13 DIAGNOSIS — Z5181 Encounter for therapeutic drug level monitoring: Secondary | ICD-10-CM

## 2019-09-13 LAB — POCT INR: INR: 3.3 — AB (ref 2.0–3.0)

## 2019-09-13 NOTE — Patient Instructions (Signed)
Take 1/2 tablet tonight then decrease dose to 1 tablet daily except 1 1/2 tablets on Tuesdays and Saturdays Recheck in 2 wks

## 2019-09-27 ENCOUNTER — Ambulatory Visit (INDEPENDENT_AMBULATORY_CARE_PROVIDER_SITE_OTHER): Payer: Medicare Other | Admitting: *Deleted

## 2019-09-27 ENCOUNTER — Other Ambulatory Visit: Payer: Self-pay

## 2019-09-27 DIAGNOSIS — I4892 Unspecified atrial flutter: Secondary | ICD-10-CM | POA: Diagnosis not present

## 2019-09-27 DIAGNOSIS — Z5181 Encounter for therapeutic drug level monitoring: Secondary | ICD-10-CM | POA: Diagnosis not present

## 2019-09-27 LAB — POCT INR: INR: 3.3 — AB (ref 2.0–3.0)

## 2019-09-27 NOTE — Patient Instructions (Signed)
Hold coumadin tonight then decrease dose to 1/2 tablet tonight then decrease dose to 1 tablet daily except 1 1/2 tablets on Saturdays Recheck in 3 wks

## 2019-10-18 ENCOUNTER — Ambulatory Visit (INDEPENDENT_AMBULATORY_CARE_PROVIDER_SITE_OTHER): Payer: Medicare Other | Admitting: *Deleted

## 2019-10-18 ENCOUNTER — Other Ambulatory Visit: Payer: Self-pay

## 2019-10-18 DIAGNOSIS — I4892 Unspecified atrial flutter: Secondary | ICD-10-CM

## 2019-10-18 DIAGNOSIS — Z5181 Encounter for therapeutic drug level monitoring: Secondary | ICD-10-CM

## 2019-10-18 LAB — POCT INR: INR: 1.5 — AB (ref 2.0–3.0)

## 2019-10-18 NOTE — Patient Instructions (Signed)
Take warfarin 2 tablets tonight, 1 1/2 tablets tomorrow night then resume 1 tablet daily except 1 1/2 tablets on Saturdays Recheck in 3 wks

## 2019-10-21 ENCOUNTER — Ambulatory Visit: Payer: Medicare Other | Admitting: Internal Medicine

## 2019-11-10 ENCOUNTER — Ambulatory Visit (INDEPENDENT_AMBULATORY_CARE_PROVIDER_SITE_OTHER): Payer: Medicare Other | Admitting: *Deleted

## 2019-11-10 ENCOUNTER — Other Ambulatory Visit: Payer: Self-pay

## 2019-11-10 DIAGNOSIS — I4892 Unspecified atrial flutter: Secondary | ICD-10-CM

## 2019-11-10 DIAGNOSIS — Z5181 Encounter for therapeutic drug level monitoring: Secondary | ICD-10-CM

## 2019-11-10 LAB — POCT INR: INR: 2 (ref 2.0–3.0)

## 2019-11-10 NOTE — Patient Instructions (Signed)
Continue warfarin 1 tablet daily except 1 1/2 tablets on Saturdays Recheck in 4 wks 

## 2019-11-18 ENCOUNTER — Telehealth: Payer: Self-pay | Admitting: Orthopedic Surgery

## 2019-11-18 DIAGNOSIS — M1712 Unilateral primary osteoarthritis, left knee: Secondary | ICD-10-CM

## 2019-11-18 NOTE — Telephone Encounter (Signed)
ok 

## 2019-11-18 NOTE — Telephone Encounter (Signed)
Patient called to ask if Dr Aline Brochure would order a wheelchair for her; states has a walker, and now "due to all the rain", thinks a wheelchair may help. Patient's primary insurance is still Medicare. Last seen by Dr Aline Brochure 07/15/18. Please advise.

## 2019-11-18 NOTE — Telephone Encounter (Signed)
Printed order will call patient see if she wants to pick up order or if she wants me to mail to her when it is signed .

## 2019-11-19 NOTE — Telephone Encounter (Signed)
Patient and spouse [name] called back in response.  Patient asking 'can I just go to [facility]  I offered as noted we can mail order or she can come to pick up.

## 2019-11-19 NOTE — Telephone Encounter (Signed)
Thanks carol  I did give order to Arbie Cookey, will let her proceed from here.

## 2019-11-19 NOTE — Telephone Encounter (Signed)
Left message for her to let her know it is ready and if she wants it mailed or if she wants to come by and pick up

## 2019-12-08 ENCOUNTER — Other Ambulatory Visit: Payer: Self-pay

## 2019-12-08 ENCOUNTER — Ambulatory Visit (INDEPENDENT_AMBULATORY_CARE_PROVIDER_SITE_OTHER): Payer: Medicare Other | Admitting: *Deleted

## 2019-12-08 DIAGNOSIS — I4892 Unspecified atrial flutter: Secondary | ICD-10-CM | POA: Diagnosis not present

## 2019-12-08 DIAGNOSIS — Z5181 Encounter for therapeutic drug level monitoring: Secondary | ICD-10-CM | POA: Diagnosis not present

## 2019-12-08 LAB — POCT INR: INR: 1.5 — AB (ref 2.0–3.0)

## 2019-12-08 NOTE — Patient Instructions (Signed)
Take warfarin 2 tablets tonight, 1 1/2 tablets tomorrow night then resume 1 tablet daily except 1 1/2 tablets on Saturdays Recheck in 3 wks

## 2019-12-12 DIAGNOSIS — Z23 Encounter for immunization: Secondary | ICD-10-CM | POA: Diagnosis not present

## 2019-12-14 DIAGNOSIS — I482 Chronic atrial fibrillation, unspecified: Secondary | ICD-10-CM | POA: Diagnosis not present

## 2019-12-14 DIAGNOSIS — R5383 Other fatigue: Secondary | ICD-10-CM | POA: Diagnosis not present

## 2019-12-14 DIAGNOSIS — F0150 Vascular dementia without behavioral disturbance: Secondary | ICD-10-CM | POA: Diagnosis not present

## 2019-12-14 DIAGNOSIS — I10 Essential (primary) hypertension: Secondary | ICD-10-CM | POA: Diagnosis not present

## 2019-12-14 DIAGNOSIS — E785 Hyperlipidemia, unspecified: Secondary | ICD-10-CM | POA: Diagnosis not present

## 2019-12-14 DIAGNOSIS — Z0189 Encounter for other specified special examinations: Secondary | ICD-10-CM | POA: Diagnosis not present

## 2019-12-14 DIAGNOSIS — Z8673 Personal history of transient ischemic attack (TIA), and cerebral infarction without residual deficits: Secondary | ICD-10-CM | POA: Diagnosis not present

## 2019-12-14 DIAGNOSIS — E1165 Type 2 diabetes mellitus with hyperglycemia: Secondary | ICD-10-CM | POA: Diagnosis not present

## 2019-12-28 DIAGNOSIS — E1165 Type 2 diabetes mellitus with hyperglycemia: Secondary | ICD-10-CM | POA: Diagnosis not present

## 2019-12-28 DIAGNOSIS — E559 Vitamin D deficiency, unspecified: Secondary | ICD-10-CM | POA: Diagnosis not present

## 2019-12-28 DIAGNOSIS — E785 Hyperlipidemia, unspecified: Secondary | ICD-10-CM | POA: Diagnosis not present

## 2019-12-28 DIAGNOSIS — E039 Hypothyroidism, unspecified: Secondary | ICD-10-CM | POA: Diagnosis not present

## 2019-12-28 DIAGNOSIS — I10 Essential (primary) hypertension: Secondary | ICD-10-CM | POA: Diagnosis not present

## 2019-12-28 DIAGNOSIS — D519 Vitamin B12 deficiency anemia, unspecified: Secondary | ICD-10-CM | POA: Diagnosis not present

## 2019-12-29 ENCOUNTER — Other Ambulatory Visit: Payer: Self-pay

## 2019-12-29 ENCOUNTER — Ambulatory Visit (INDEPENDENT_AMBULATORY_CARE_PROVIDER_SITE_OTHER): Payer: Medicare Other | Admitting: *Deleted

## 2019-12-29 DIAGNOSIS — Z5181 Encounter for therapeutic drug level monitoring: Secondary | ICD-10-CM | POA: Diagnosis not present

## 2019-12-29 DIAGNOSIS — I4892 Unspecified atrial flutter: Secondary | ICD-10-CM | POA: Diagnosis not present

## 2019-12-29 LAB — POCT INR: INR: 1.1 — AB (ref 2.0–3.0)

## 2019-12-29 NOTE — Patient Instructions (Signed)
Take warfarin 2 tablets tonight, 1 1/2 tablets tomorrow night then resume 1 tablet daily except 1 1/2 tablets on Saturdays Recheck in 2 wks

## 2020-01-04 DIAGNOSIS — Z8673 Personal history of transient ischemic attack (TIA), and cerebral infarction without residual deficits: Secondary | ICD-10-CM | POA: Diagnosis not present

## 2020-01-04 DIAGNOSIS — F0150 Vascular dementia without behavioral disturbance: Secondary | ICD-10-CM | POA: Diagnosis not present

## 2020-01-04 DIAGNOSIS — I482 Chronic atrial fibrillation, unspecified: Secondary | ICD-10-CM | POA: Diagnosis not present

## 2020-01-04 DIAGNOSIS — I10 Essential (primary) hypertension: Secondary | ICD-10-CM | POA: Diagnosis not present

## 2020-01-04 DIAGNOSIS — E1165 Type 2 diabetes mellitus with hyperglycemia: Secondary | ICD-10-CM | POA: Diagnosis not present

## 2020-01-04 DIAGNOSIS — N1832 Chronic kidney disease, stage 3b: Secondary | ICD-10-CM | POA: Diagnosis not present

## 2020-01-04 DIAGNOSIS — R5383 Other fatigue: Secondary | ICD-10-CM | POA: Diagnosis not present

## 2020-01-04 DIAGNOSIS — D649 Anemia, unspecified: Secondary | ICD-10-CM | POA: Diagnosis not present

## 2020-01-04 DIAGNOSIS — E785 Hyperlipidemia, unspecified: Secondary | ICD-10-CM | POA: Diagnosis not present

## 2020-01-09 DIAGNOSIS — Z23 Encounter for immunization: Secondary | ICD-10-CM | POA: Diagnosis not present

## 2020-01-12 ENCOUNTER — Ambulatory Visit (INDEPENDENT_AMBULATORY_CARE_PROVIDER_SITE_OTHER): Payer: Medicare Other | Admitting: *Deleted

## 2020-01-12 ENCOUNTER — Other Ambulatory Visit: Payer: Self-pay

## 2020-01-12 DIAGNOSIS — I4892 Unspecified atrial flutter: Secondary | ICD-10-CM

## 2020-01-12 DIAGNOSIS — Z5181 Encounter for therapeutic drug level monitoring: Secondary | ICD-10-CM

## 2020-01-12 LAB — POCT INR: INR: 1.9 — AB (ref 2.0–3.0)

## 2020-01-12 NOTE — Patient Instructions (Signed)
Take warfarin 1 1/2 tablets tonight then resume 1 tablet daily except 1 1/2 tablets on Saturdays Recheck in 3 wks

## 2020-02-02 ENCOUNTER — Other Ambulatory Visit: Payer: Self-pay

## 2020-02-02 ENCOUNTER — Ambulatory Visit (INDEPENDENT_AMBULATORY_CARE_PROVIDER_SITE_OTHER): Payer: Medicare Other | Admitting: *Deleted

## 2020-02-02 DIAGNOSIS — I4892 Unspecified atrial flutter: Secondary | ICD-10-CM

## 2020-02-02 DIAGNOSIS — Z5181 Encounter for therapeutic drug level monitoring: Secondary | ICD-10-CM

## 2020-02-02 LAB — POCT INR: INR: 2.3 (ref 2.0–3.0)

## 2020-02-02 NOTE — Patient Instructions (Signed)
Continue warfarin 1 tablet daily except 1 1/2 tablets on Saturdays Recheck in 4 wks

## 2020-03-08 ENCOUNTER — Ambulatory Visit (INDEPENDENT_AMBULATORY_CARE_PROVIDER_SITE_OTHER): Payer: Medicare Other | Admitting: *Deleted

## 2020-03-08 ENCOUNTER — Other Ambulatory Visit: Payer: Self-pay

## 2020-03-08 DIAGNOSIS — I4892 Unspecified atrial flutter: Secondary | ICD-10-CM | POA: Diagnosis not present

## 2020-03-08 DIAGNOSIS — Z5181 Encounter for therapeutic drug level monitoring: Secondary | ICD-10-CM | POA: Diagnosis not present

## 2020-03-08 LAB — POCT INR: INR: 2.5 (ref 2.0–3.0)

## 2020-03-08 NOTE — Patient Instructions (Signed)
Continue warfarin 1 tablet daily except 1 1/2 tablets on Saturdays Recheck in 4 wks

## 2020-04-27 DIAGNOSIS — I10 Essential (primary) hypertension: Secondary | ICD-10-CM | POA: Diagnosis not present

## 2020-04-27 DIAGNOSIS — F0150 Vascular dementia without behavioral disturbance: Secondary | ICD-10-CM | POA: Diagnosis not present

## 2020-04-27 DIAGNOSIS — N1832 Chronic kidney disease, stage 3b: Secondary | ICD-10-CM | POA: Diagnosis not present

## 2020-04-27 DIAGNOSIS — E1165 Type 2 diabetes mellitus with hyperglycemia: Secondary | ICD-10-CM | POA: Diagnosis not present

## 2020-04-27 DIAGNOSIS — R5383 Other fatigue: Secondary | ICD-10-CM | POA: Diagnosis not present

## 2020-05-02 ENCOUNTER — Ambulatory Visit (INDEPENDENT_AMBULATORY_CARE_PROVIDER_SITE_OTHER): Payer: Medicare Other | Admitting: *Deleted

## 2020-05-02 DIAGNOSIS — Z5181 Encounter for therapeutic drug level monitoring: Secondary | ICD-10-CM

## 2020-05-02 DIAGNOSIS — I4892 Unspecified atrial flutter: Secondary | ICD-10-CM

## 2020-05-02 LAB — POCT INR: INR: 2.5 (ref 2.0–3.0)

## 2020-05-02 NOTE — Patient Instructions (Signed)
Continue warfarin 1 tablet daily except 1 1/2 tablets on Saturdays Recheck in 6 wks

## 2020-05-08 DIAGNOSIS — E1165 Type 2 diabetes mellitus with hyperglycemia: Secondary | ICD-10-CM | POA: Diagnosis not present

## 2020-05-08 DIAGNOSIS — E782 Mixed hyperlipidemia: Secondary | ICD-10-CM | POA: Diagnosis not present

## 2020-05-08 DIAGNOSIS — I129 Hypertensive chronic kidney disease with stage 1 through stage 4 chronic kidney disease, or unspecified chronic kidney disease: Secondary | ICD-10-CM | POA: Diagnosis not present

## 2020-05-08 DIAGNOSIS — N1832 Chronic kidney disease, stage 3b: Secondary | ICD-10-CM | POA: Diagnosis not present

## 2020-05-08 DIAGNOSIS — F0150 Vascular dementia without behavioral disturbance: Secondary | ICD-10-CM | POA: Diagnosis not present

## 2020-05-08 DIAGNOSIS — Z8673 Personal history of transient ischemic attack (TIA), and cerebral infarction without residual deficits: Secondary | ICD-10-CM | POA: Diagnosis not present

## 2020-05-08 DIAGNOSIS — E1122 Type 2 diabetes mellitus with diabetic chronic kidney disease: Secondary | ICD-10-CM | POA: Diagnosis not present

## 2020-05-08 DIAGNOSIS — Z6830 Body mass index (BMI) 30.0-30.9, adult: Secondary | ICD-10-CM | POA: Diagnosis not present

## 2020-05-08 DIAGNOSIS — E6609 Other obesity due to excess calories: Secondary | ICD-10-CM | POA: Diagnosis not present

## 2020-05-08 DIAGNOSIS — I482 Chronic atrial fibrillation, unspecified: Secondary | ICD-10-CM | POA: Diagnosis not present

## 2020-05-08 DIAGNOSIS — D631 Anemia in chronic kidney disease: Secondary | ICD-10-CM | POA: Diagnosis not present

## 2020-06-06 DIAGNOSIS — S51811A Laceration without foreign body of right forearm, initial encounter: Secondary | ICD-10-CM | POA: Diagnosis not present

## 2020-06-06 DIAGNOSIS — Z23 Encounter for immunization: Secondary | ICD-10-CM | POA: Diagnosis not present

## 2020-06-13 ENCOUNTER — Ambulatory Visit (INDEPENDENT_AMBULATORY_CARE_PROVIDER_SITE_OTHER): Payer: Medicare Other | Admitting: *Deleted

## 2020-06-13 DIAGNOSIS — Z5181 Encounter for therapeutic drug level monitoring: Secondary | ICD-10-CM

## 2020-06-13 DIAGNOSIS — I4892 Unspecified atrial flutter: Secondary | ICD-10-CM

## 2020-06-13 LAB — POCT INR: INR: 1.2 — AB (ref 2.0–3.0)

## 2020-06-13 NOTE — Patient Instructions (Signed)
Take warfarin 2 tablets tonight, 1 1/2 tablets tomorrow night then resume 1 tablet daily except 1 1/2 tablets on Saturdays Recheck in 2 wks

## 2020-07-03 ENCOUNTER — Ambulatory Visit (INDEPENDENT_AMBULATORY_CARE_PROVIDER_SITE_OTHER): Payer: Medicare Other | Admitting: *Deleted

## 2020-07-03 DIAGNOSIS — Z5181 Encounter for therapeutic drug level monitoring: Secondary | ICD-10-CM

## 2020-07-03 DIAGNOSIS — I4892 Unspecified atrial flutter: Secondary | ICD-10-CM | POA: Diagnosis not present

## 2020-07-03 LAB — POCT INR: INR: 1.5 — AB (ref 2.0–3.0)

## 2020-07-03 NOTE — Patient Instructions (Signed)
Take warfarin 1 1/2 tablets tonight and tomorrow night then resume 1 tablet daily except 1 1/2 tablets on Saturdays Recheck in 3 wks START TAKING WITH EVENING MEAL

## 2020-07-14 ENCOUNTER — Other Ambulatory Visit: Payer: Self-pay

## 2020-07-14 ENCOUNTER — Encounter (HOSPITAL_COMMUNITY)
Admission: EM | Disposition: A | Discharge status: Home Health | Payer: Self-pay | Source: Home / Self Care | Attending: Cardiology

## 2020-07-14 ENCOUNTER — Inpatient Hospital Stay (HOSPITAL_COMMUNITY): Payer: Medicare Other

## 2020-07-14 ENCOUNTER — Encounter (HOSPITAL_COMMUNITY): Payer: Self-pay | Admitting: *Deleted

## 2020-07-14 ENCOUNTER — Emergency Department (HOSPITAL_COMMUNITY): Payer: Medicare Other

## 2020-07-14 ENCOUNTER — Inpatient Hospital Stay (HOSPITAL_COMMUNITY)
Admission: EM | Admit: 2020-07-14 | Discharge: 2020-07-22 | DRG: 246 | Disposition: A | Discharge status: Home Health | Payer: Medicare Other | Attending: Cardiovascular Disease | Admitting: Cardiovascular Disease

## 2020-07-14 DIAGNOSIS — I214 Non-ST elevation (NSTEMI) myocardial infarction: Secondary | ICD-10-CM

## 2020-07-14 DIAGNOSIS — I517 Cardiomegaly: Secondary | ICD-10-CM | POA: Diagnosis not present

## 2020-07-14 DIAGNOSIS — J9601 Acute respiratory failure with hypoxia: Secondary | ICD-10-CM | POA: Diagnosis not present

## 2020-07-14 DIAGNOSIS — E1122 Type 2 diabetes mellitus with diabetic chronic kidney disease: Secondary | ICD-10-CM | POA: Diagnosis not present

## 2020-07-14 DIAGNOSIS — M1712 Unilateral primary osteoarthritis, left knee: Secondary | ICD-10-CM | POA: Diagnosis present

## 2020-07-14 DIAGNOSIS — I2510 Atherosclerotic heart disease of native coronary artery without angina pectoris: Secondary | ICD-10-CM | POA: Diagnosis not present

## 2020-07-14 DIAGNOSIS — E119 Type 2 diabetes mellitus without complications: Secondary | ICD-10-CM

## 2020-07-14 DIAGNOSIS — N179 Acute kidney failure, unspecified: Secondary | ICD-10-CM | POA: Diagnosis present

## 2020-07-14 DIAGNOSIS — R9431 Abnormal electrocardiogram [ECG] [EKG]: Secondary | ICD-10-CM

## 2020-07-14 DIAGNOSIS — I5041 Acute combined systolic (congestive) and diastolic (congestive) heart failure: Secondary | ICD-10-CM | POA: Diagnosis not present

## 2020-07-14 DIAGNOSIS — I350 Nonrheumatic aortic (valve) stenosis: Secondary | ICD-10-CM | POA: Diagnosis not present

## 2020-07-14 DIAGNOSIS — I130 Hypertensive heart and chronic kidney disease with heart failure and stage 1 through stage 4 chronic kidney disease, or unspecified chronic kidney disease: Secondary | ICD-10-CM | POA: Diagnosis present

## 2020-07-14 DIAGNOSIS — I2111 ST elevation (STEMI) myocardial infarction involving right coronary artery: Secondary | ICD-10-CM | POA: Diagnosis not present

## 2020-07-14 DIAGNOSIS — I5043 Acute on chronic combined systolic (congestive) and diastolic (congestive) heart failure: Secondary | ICD-10-CM | POA: Diagnosis present

## 2020-07-14 DIAGNOSIS — I4892 Unspecified atrial flutter: Secondary | ICD-10-CM | POA: Diagnosis not present

## 2020-07-14 DIAGNOSIS — Z833 Family history of diabetes mellitus: Secondary | ICD-10-CM

## 2020-07-14 DIAGNOSIS — J90 Pleural effusion, not elsewhere classified: Secondary | ICD-10-CM | POA: Diagnosis not present

## 2020-07-14 DIAGNOSIS — I249 Acute ischemic heart disease, unspecified: Secondary | ICD-10-CM

## 2020-07-14 DIAGNOSIS — Z8261 Family history of arthritis: Secondary | ICD-10-CM

## 2020-07-14 DIAGNOSIS — J9690 Respiratory failure, unspecified, unspecified whether with hypoxia or hypercapnia: Secondary | ICD-10-CM

## 2020-07-14 DIAGNOSIS — E782 Mixed hyperlipidemia: Secondary | ICD-10-CM | POA: Diagnosis present

## 2020-07-14 DIAGNOSIS — Z79899 Other long term (current) drug therapy: Secondary | ICD-10-CM

## 2020-07-14 DIAGNOSIS — Z86711 Personal history of pulmonary embolism: Secondary | ICD-10-CM

## 2020-07-14 DIAGNOSIS — I700 Atherosclerosis of aorta: Secondary | ICD-10-CM | POA: Diagnosis not present

## 2020-07-14 DIAGNOSIS — E1100 Type 2 diabetes mellitus with hyperosmolarity without nonketotic hyperglycemic-hyperosmolar coma (NKHHC): Secondary | ICD-10-CM | POA: Diagnosis not present

## 2020-07-14 DIAGNOSIS — N289 Disorder of kidney and ureter, unspecified: Secondary | ICD-10-CM

## 2020-07-14 DIAGNOSIS — D649 Anemia, unspecified: Secondary | ICD-10-CM

## 2020-07-14 DIAGNOSIS — Z23 Encounter for immunization: Secondary | ICD-10-CM

## 2020-07-14 DIAGNOSIS — X58XXXA Exposure to other specified factors, initial encounter: Secondary | ICD-10-CM

## 2020-07-14 DIAGNOSIS — N1831 Chronic kidney disease, stage 3a: Secondary | ICD-10-CM | POA: Diagnosis not present

## 2020-07-14 DIAGNOSIS — I5021 Acute systolic (congestive) heart failure: Secondary | ICD-10-CM | POA: Diagnosis not present

## 2020-07-14 DIAGNOSIS — D62 Acute posthemorrhagic anemia: Secondary | ICD-10-CM | POA: Diagnosis not present

## 2020-07-14 DIAGNOSIS — E876 Hypokalemia: Secondary | ICD-10-CM | POA: Diagnosis not present

## 2020-07-14 DIAGNOSIS — I5031 Acute diastolic (congestive) heart failure: Secondary | ICD-10-CM | POA: Diagnosis not present

## 2020-07-14 DIAGNOSIS — E872 Acidosis: Secondary | ICD-10-CM | POA: Diagnosis not present

## 2020-07-14 DIAGNOSIS — I255 Ischemic cardiomyopathy: Secondary | ICD-10-CM | POA: Diagnosis present

## 2020-07-14 DIAGNOSIS — Z7901 Long term (current) use of anticoagulants: Secondary | ICD-10-CM

## 2020-07-14 DIAGNOSIS — Z87891 Personal history of nicotine dependence: Secondary | ICD-10-CM

## 2020-07-14 DIAGNOSIS — I213 ST elevation (STEMI) myocardial infarction of unspecified site: Secondary | ICD-10-CM | POA: Diagnosis not present

## 2020-07-14 DIAGNOSIS — D508 Other iron deficiency anemias: Secondary | ICD-10-CM | POA: Diagnosis not present

## 2020-07-14 DIAGNOSIS — Z7982 Long term (current) use of aspirin: Secondary | ICD-10-CM

## 2020-07-14 DIAGNOSIS — T17918A Gastric contents in respiratory tract, part unspecified causing other injury, initial encounter: Secondary | ICD-10-CM | POA: Diagnosis not present

## 2020-07-14 DIAGNOSIS — J80 Acute respiratory distress syndrome: Secondary | ICD-10-CM | POA: Diagnosis not present

## 2020-07-14 DIAGNOSIS — M1610 Unilateral primary osteoarthritis, unspecified hip: Secondary | ICD-10-CM | POA: Diagnosis present

## 2020-07-14 DIAGNOSIS — Z20822 Contact with and (suspected) exposure to covid-19: Secondary | ICD-10-CM | POA: Diagnosis not present

## 2020-07-14 DIAGNOSIS — Z9181 History of falling: Secondary | ICD-10-CM

## 2020-07-14 DIAGNOSIS — J810 Acute pulmonary edema: Secondary | ICD-10-CM | POA: Diagnosis not present

## 2020-07-14 DIAGNOSIS — Z955 Presence of coronary angioplasty implant and graft: Secondary | ICD-10-CM

## 2020-07-14 DIAGNOSIS — J811 Chronic pulmonary edema: Secondary | ICD-10-CM | POA: Diagnosis not present

## 2020-07-14 DIAGNOSIS — N183 Chronic kidney disease, stage 3 unspecified: Secondary | ICD-10-CM | POA: Diagnosis present

## 2020-07-14 DIAGNOSIS — R0602 Shortness of breath: Secondary | ICD-10-CM | POA: Diagnosis not present

## 2020-07-14 DIAGNOSIS — Z8249 Family history of ischemic heart disease and other diseases of the circulatory system: Secondary | ICD-10-CM

## 2020-07-14 DIAGNOSIS — Z4682 Encounter for fitting and adjustment of non-vascular catheter: Secondary | ICD-10-CM | POA: Diagnosis not present

## 2020-07-14 DIAGNOSIS — I252 Old myocardial infarction: Secondary | ICD-10-CM | POA: Diagnosis not present

## 2020-07-14 DIAGNOSIS — J189 Pneumonia, unspecified organism: Secondary | ICD-10-CM | POA: Diagnosis not present

## 2020-07-14 DIAGNOSIS — N1832 Chronic kidney disease, stage 3b: Secondary | ICD-10-CM | POA: Diagnosis not present

## 2020-07-14 DIAGNOSIS — R200 Anesthesia of skin: Secondary | ICD-10-CM | POA: Diagnosis present

## 2020-07-14 DIAGNOSIS — R079 Chest pain, unspecified: Secondary | ICD-10-CM | POA: Diagnosis not present

## 2020-07-14 DIAGNOSIS — R29810 Facial weakness: Secondary | ICD-10-CM | POA: Diagnosis present

## 2020-07-14 DIAGNOSIS — R0902 Hypoxemia: Secondary | ICD-10-CM | POA: Diagnosis not present

## 2020-07-14 DIAGNOSIS — E785 Hyperlipidemia, unspecified: Secondary | ICD-10-CM | POA: Diagnosis not present

## 2020-07-14 DIAGNOSIS — R040 Epistaxis: Secondary | ICD-10-CM | POA: Diagnosis not present

## 2020-07-14 DIAGNOSIS — Z7984 Long term (current) use of oral hypoglycemic drugs: Secondary | ICD-10-CM

## 2020-07-14 DIAGNOSIS — I509 Heart failure, unspecified: Secondary | ICD-10-CM

## 2020-07-14 DIAGNOSIS — I10 Essential (primary) hypertension: Secondary | ICD-10-CM | POA: Diagnosis present

## 2020-07-14 DIAGNOSIS — R0789 Other chest pain: Secondary | ICD-10-CM | POA: Diagnosis not present

## 2020-07-14 DIAGNOSIS — M160 Bilateral primary osteoarthritis of hip: Secondary | ICD-10-CM | POA: Diagnosis not present

## 2020-07-14 DIAGNOSIS — D631 Anemia in chronic kidney disease: Secondary | ICD-10-CM | POA: Diagnosis present

## 2020-07-14 DIAGNOSIS — Z8673 Personal history of transient ischemic attack (TIA), and cerebral infarction without residual deficits: Secondary | ICD-10-CM

## 2020-07-14 HISTORY — PX: LEFT HEART CATH AND CORONARY ANGIOGRAPHY: CATH118249

## 2020-07-14 HISTORY — PX: CORONARY STENT INTERVENTION: CATH118234

## 2020-07-14 LAB — I-STAT ARTERIAL BLOOD GAS, ED
Acid-base deficit: 2 mmol/L (ref 0.0–2.0)
Bicarbonate: 26.1 mmol/L (ref 20.0–28.0)
Calcium, Ion: 1.25 mmol/L (ref 1.15–1.40)
HCT: 35 % — ABNORMAL LOW (ref 36.0–46.0)
Hemoglobin: 11.9 g/dL — ABNORMAL LOW (ref 12.0–15.0)
O2 Saturation: 100 %
Patient temperature: 98.3
Potassium: 4.4 mmol/L (ref 3.5–5.1)
Sodium: 140 mmol/L (ref 135–145)
TCO2: 28 mmol/L (ref 22–32)
pCO2 arterial: 60.5 mmHg — ABNORMAL HIGH (ref 32.0–48.0)
pH, Arterial: 7.242 — ABNORMAL LOW (ref 7.350–7.450)
pO2, Arterial: 265 mmHg — ABNORMAL HIGH (ref 83.0–108.0)

## 2020-07-14 LAB — POCT I-STAT 7, (LYTES, BLD GAS, ICA,H+H)
Acid-base deficit: 1 mmol/L (ref 0.0–2.0)
Bicarbonate: 23.9 mmol/L (ref 20.0–28.0)
Calcium, Ion: 1.13 mmol/L — ABNORMAL LOW (ref 1.15–1.40)
HCT: 29 % — ABNORMAL LOW (ref 36.0–46.0)
Hemoglobin: 9.9 g/dL — ABNORMAL LOW (ref 12.0–15.0)
O2 Saturation: 100 %
Potassium: 4.1 mmol/L (ref 3.5–5.1)
Sodium: 136 mmol/L (ref 135–145)
TCO2: 25 mmol/L (ref 22–32)
pCO2 arterial: 42.2 mmHg (ref 32.0–48.0)
pH, Arterial: 7.362 (ref 7.350–7.450)
pO2, Arterial: 237 mmHg — ABNORMAL HIGH (ref 83.0–108.0)

## 2020-07-14 LAB — ECHOCARDIOGRAM COMPLETE
AR max vel: 1.44 cm2
AV Area VTI: 1.4 cm2
AV Area mean vel: 1.36 cm2
AV Mean grad: 10 mmHg
AV Peak grad: 17 mmHg
Ao pk vel: 2.06 m/s
Area-P 1/2: 3.21 cm2
Height: 65.5 in
S' Lateral: 3.4 cm
Weight: 2941.82 oz

## 2020-07-14 LAB — CBC WITH DIFFERENTIAL/PLATELET
Abs Immature Granulocytes: 0.01 10*3/uL (ref 0.00–0.07)
Basophils Absolute: 0.1 10*3/uL (ref 0.0–0.1)
Basophils Relative: 1 %
Eosinophils Absolute: 0.2 10*3/uL (ref 0.0–0.5)
Eosinophils Relative: 3 %
HCT: 32.9 % — ABNORMAL LOW (ref 36.0–46.0)
Hemoglobin: 10 g/dL — ABNORMAL LOW (ref 12.0–15.0)
Immature Granulocytes: 0 %
Lymphocytes Relative: 32 %
Lymphs Abs: 2.2 10*3/uL (ref 0.7–4.0)
MCH: 27.5 pg (ref 26.0–34.0)
MCHC: 30.4 g/dL (ref 30.0–36.0)
MCV: 90.4 fL (ref 80.0–100.0)
Monocytes Absolute: 0.8 10*3/uL (ref 0.1–1.0)
Monocytes Relative: 12 %
Neutro Abs: 3.5 10*3/uL (ref 1.7–7.7)
Neutrophils Relative %: 52 %
Platelets: 338 10*3/uL (ref 150–400)
RBC: 3.64 MIL/uL — ABNORMAL LOW (ref 3.87–5.11)
RDW: 14.5 % (ref 11.5–15.5)
WBC: 6.7 10*3/uL (ref 4.0–10.5)
nRBC: 0 % (ref 0.0–0.2)

## 2020-07-14 LAB — BASIC METABOLIC PANEL
Anion gap: 10 (ref 5–15)
Anion gap: 14 (ref 5–15)
BUN: 34 mg/dL — ABNORMAL HIGH (ref 8–23)
BUN: 35 mg/dL — ABNORMAL HIGH (ref 8–23)
CO2: 25 mmol/L (ref 22–32)
CO2: 22 mmol/L (ref 22–32)
Calcium: 9.2 mg/dL (ref 8.9–10.3)
Calcium: 8.9 mg/dL (ref 8.9–10.3)
Chloride: 105 mmol/L (ref 98–111)
Chloride: 105 mmol/L (ref 98–111)
Creatinine, Ser: 1.47 mg/dL — ABNORMAL HIGH (ref 0.44–1.00)
Creatinine, Ser: 1.57 mg/dL — ABNORMAL HIGH (ref 0.44–1.00)
GFR calc Af Amer: 39 mL/min — ABNORMAL LOW (ref >60)
GFR calc Af Amer: 36 mL/min — ABNORMAL LOW (ref >60)
GFR calc non Af Amer: 33 mL/min — ABNORMAL LOW (ref >60)
GFR calc non Af Amer: 31 mL/min — ABNORMAL LOW (ref >60)
Glucose, Bld: 203 mg/dL — ABNORMAL HIGH (ref 70–99)
Glucose, Bld: 205 mg/dL — ABNORMAL HIGH (ref 70–99)
Potassium: 4.1 mmol/L (ref 3.5–5.1)
Potassium: 4.6 mmol/L (ref 3.5–5.1)
Sodium: 140 mmol/L (ref 135–145)
Sodium: 141 mmol/L (ref 135–145)

## 2020-07-14 LAB — RESPIRATORY PANEL BY RT PCR (FLU A&B, COVID)
Influenza A by PCR: NEGATIVE
Influenza B by PCR: NEGATIVE
SARS Coronavirus 2 by RT PCR: NEGATIVE

## 2020-07-14 LAB — HEMOGLOBIN A1C
Hgb A1c MFr Bld: 7.3 % — ABNORMAL HIGH (ref 4.8–5.6)
Mean Plasma Glucose: 162.81 mg/dL

## 2020-07-14 LAB — GLUCOSE, CAPILLARY
Glucose-Capillary: 169 mg/dL — ABNORMAL HIGH (ref 70–99)
Glucose-Capillary: 108 mg/dL — ABNORMAL HIGH (ref 70–99)

## 2020-07-14 LAB — HEPARIN LEVEL (UNFRACTIONATED): Heparin Unfractionated: 0.38 IU/mL (ref 0.30–0.70)

## 2020-07-14 LAB — LIPID PANEL
Cholesterol: 177 mg/dL (ref 0–200)
HDL: 62 mg/dL (ref >40)
LDL Cholesterol: 91 mg/dL (ref 0–99)
Total CHOL/HDL Ratio: 2.9 RATIO
Triglycerides: 120 mg/dL (ref <150)
VLDL: 24 mg/dL (ref 0–40)

## 2020-07-14 LAB — TROPONIN I (HIGH SENSITIVITY)
Troponin I (High Sensitivity): 264 ng/L (ref <18)
Troponin I (High Sensitivity): 315 ng/L (ref <18)
Troponin I (High Sensitivity): 604 ng/L (ref <18)
Troponin I (High Sensitivity): 3485 ng/L (ref <18)

## 2020-07-14 LAB — MRSA PCR SCREENING: MRSA by PCR: NEGATIVE

## 2020-07-14 LAB — TSH: TSH: 2.309 u[IU]/mL (ref 0.350–4.500)

## 2020-07-14 LAB — BRAIN NATRIURETIC PEPTIDE: B Natriuretic Peptide: 397.2 pg/mL — ABNORMAL HIGH (ref 0.0–100.0)

## 2020-07-14 LAB — LACTIC ACID, PLASMA
Lactic Acid, Venous: 3.5 mmol/L (ref 0.5–1.9)
Lactic Acid, Venous: 3.1 mmol/L (ref 0.5–1.9)

## 2020-07-14 LAB — APTT: aPTT: 24 seconds (ref 24–36)

## 2020-07-14 LAB — PROTIME-INR
INR: 1 (ref 0.8–1.2)
Prothrombin Time: 12.8 seconds (ref 11.4–15.2)

## 2020-07-14 LAB — POCT ACTIVATED CLOTTING TIME: Activated Clotting Time: 433 seconds

## 2020-07-14 LAB — TRIGLYCERIDES: Triglycerides: 78 mg/dL (ref <150)

## 2020-07-14 SURGERY — LEFT HEART CATH AND CORONARY ANGIOGRAPHY
Anesthesia: LOCAL

## 2020-07-14 MED ORDER — MIDAZOLAM HCL 2 MG/2ML IJ SOLN
INTRAMUSCULAR | Status: AC
  Filled 2020-07-14: qty 2

## 2020-07-14 MED ORDER — HEPARIN (PORCINE) IN NACL 1000-0.9 UT/500ML-% IV SOLN
INTRAVENOUS | Status: AC
  Filled 2020-07-14: qty 1000

## 2020-07-14 MED ORDER — SODIUM CHLORIDE 0.9 % IV SOLN
INTRAVENOUS | Status: DC | PRN
  Administered 2020-07-14: 1.75 mg/kg/h via INTRAVENOUS

## 2020-07-14 MED ORDER — IOHEXOL 350 MG/ML SOLN
INTRAVENOUS | Status: DC | PRN
  Administered 2020-07-14: 60 mL

## 2020-07-14 MED ORDER — SODIUM CHLORIDE 0.9 % IV SOLN
INTRAVENOUS | Status: AC | PRN
  Administered 2020-07-14: 4 ug/kg/min via INTRAVENOUS

## 2020-07-14 MED ORDER — HEPARIN SODIUM (PORCINE) 1000 UNIT/ML IJ SOLN
INTRAMUSCULAR | Status: AC
  Filled 2020-07-14: qty 1

## 2020-07-14 MED ORDER — CHLORHEXIDINE GLUCONATE 0.12% ORAL RINSE (MEDLINE KIT)
15.0000 mL | Freq: Two times a day (BID) | OROMUCOSAL | Status: DC
  Administered 2020-07-14: 15 mL via OROMUCOSAL

## 2020-07-14 MED ORDER — PERFLUTREN LIPID MICROSPHERE
1.0000 mL | INTRAVENOUS | Status: AC | PRN
  Administered 2020-07-14: 3 mL via INTRAVENOUS
  Filled 2020-07-14: qty 10

## 2020-07-14 MED ORDER — WARFARIN SODIUM 5 MG PO TABS
5.0000 mg | ORAL_TABLET | Freq: Once | ORAL | Status: AC
  Administered 2020-07-14: 5 mg via ORAL
  Filled 2020-07-14: qty 1

## 2020-07-14 MED ORDER — CLOPIDOGREL BISULFATE 300 MG PO TABS
600.0000 mg | ORAL_TABLET | Freq: Every day | ORAL | Status: DC
  Administered 2020-07-14: 600 mg
  Filled 2020-07-14: qty 2

## 2020-07-14 MED ORDER — FUROSEMIDE 10 MG/ML IJ SOLN
40.0000 mg | Freq: Once | INTRAMUSCULAR | Status: AC
  Administered 2020-07-14: 40 mg via INTRAVENOUS
  Filled 2020-07-14: qty 4

## 2020-07-14 MED ORDER — PANTOPRAZOLE SODIUM 40 MG IV SOLR
40.0000 mg | Freq: Every day | INTRAVENOUS | Status: DC
  Administered 2020-07-14: 40 mg via INTRAVENOUS
  Filled 2020-07-14: qty 40

## 2020-07-14 MED ORDER — PROPOFOL 1000 MG/100ML IV EMUL
5.0000 ug/kg/min | INTRAVENOUS | Status: DC
  Administered 2020-07-14: 5 ug/kg/min via INTRAVENOUS
  Filled 2020-07-14: qty 100

## 2020-07-14 MED ORDER — CHLORHEXIDINE GLUCONATE CLOTH 2 % EX PADS
6.0000 | MEDICATED_PAD | Freq: Every day | CUTANEOUS | Status: DC
  Administered 2020-07-14 – 2020-07-21 (×6): 6 via TOPICAL

## 2020-07-14 MED ORDER — SODIUM CHLORIDE 0.9 % IV SOLN: 250.0000 mL | INTRAVENOUS | Status: DC | PRN

## 2020-07-14 MED ORDER — ACETAMINOPHEN 325 MG PO TABS: 650.0000 mg | ORAL_TABLET | ORAL | Status: DC | PRN

## 2020-07-14 MED ORDER — ASPIRIN 81 MG PO CHEW
324.0000 mg | CHEWABLE_TABLET | Freq: Once | ORAL | Status: AC
  Administered 2020-07-14: 324 mg via ORAL
  Filled 2020-07-14: qty 4

## 2020-07-14 MED ORDER — LIDOCAINE HCL (PF) 1 % IJ SOLN
INTRAMUSCULAR | Status: DC | PRN
  Administered 2020-07-14: 15 mL

## 2020-07-14 MED ORDER — SODIUM CHLORIDE 0.9 % IV SOLN: INTRAVENOUS | Status: DC

## 2020-07-14 MED ORDER — POLYETHYLENE GLYCOL 3350 17 G PO PACK
17.0000 g | PACK | Freq: Every day | ORAL | Status: DC
  Administered 2020-07-16 – 2020-07-22 (×7): 17 g via ORAL
  Filled 2020-07-14 (×7): qty 1

## 2020-07-14 MED ORDER — SODIUM CHLORIDE 0.9% FLUSH: 3.0000 mL | INTRAVENOUS | Status: DC | PRN

## 2020-07-14 MED ORDER — CANGRELOR TETRASODIUM 50 MG IV SOLR
INTRAVENOUS | Status: AC
  Filled 2020-07-14: qty 50

## 2020-07-14 MED ORDER — SODIUM CHLORIDE 0.9% FLUSH: 3.0000 mL | Freq: Two times a day (BID) | INTRAVENOUS | Status: DC

## 2020-07-14 MED ORDER — ROCURONIUM BROMIDE 50 MG/5ML IV SOLN
INTRAVENOUS | Status: AC | PRN
  Administered 2020-07-14: 100 mg via INTRAVENOUS

## 2020-07-14 MED ORDER — INSULIN ASPART 100 UNIT/ML ~~LOC~~ SOLN
0.0000 [IU] | Freq: Three times a day (TID) | SUBCUTANEOUS | Status: DC
  Administered 2020-07-14: 3 [IU] via SUBCUTANEOUS
  Administered 2020-07-15: 2 [IU] via SUBCUTANEOUS
  Administered 2020-07-15: 3 [IU] via SUBCUTANEOUS
  Administered 2020-07-15: 5 [IU] via SUBCUTANEOUS
  Administered 2020-07-16: 8 [IU] via SUBCUTANEOUS
  Administered 2020-07-16: 3 [IU] via SUBCUTANEOUS
  Administered 2020-07-16: 8 [IU] via SUBCUTANEOUS
  Administered 2020-07-17: 5 [IU] via SUBCUTANEOUS
  Administered 2020-07-17: 3 [IU] via SUBCUTANEOUS
  Administered 2020-07-17: 2 [IU] via SUBCUTANEOUS
  Administered 2020-07-18 (×2): 3 [IU] via SUBCUTANEOUS
  Administered 2020-07-18 – 2020-07-19 (×2): 5 [IU] via SUBCUTANEOUS
  Administered 2020-07-19 (×2): 2 [IU] via SUBCUTANEOUS
  Administered 2020-07-20: 8 [IU] via SUBCUTANEOUS
  Administered 2020-07-20: 5 [IU] via SUBCUTANEOUS
  Administered 2020-07-21: 11 [IU] via SUBCUTANEOUS
  Administered 2020-07-21: 2 [IU] via SUBCUTANEOUS
  Administered 2020-07-22: 8 [IU] via SUBCUTANEOUS
  Administered 2020-07-22: 2 [IU] via SUBCUTANEOUS

## 2020-07-14 MED ORDER — LABETALOL HCL 5 MG/ML IV SOLN: 10.0000 mg | INTRAVENOUS | Status: AC | PRN

## 2020-07-14 MED ORDER — NITROGLYCERIN IN D5W 200-5 MCG/ML-% IV SOLN
0.0000 ug/min | INTRAVENOUS | Status: DC
  Administered 2020-07-14: 10 ug/min via INTRAVENOUS
  Filled 2020-07-14: qty 250

## 2020-07-14 MED ORDER — PROPOFOL 1000 MG/100ML IV EMUL
5.0000 ug/kg/min | INTRAVENOUS | Status: DC
  Administered 2020-07-14: 15 ug/kg/min via INTRAVENOUS

## 2020-07-14 MED ORDER — ASPIRIN 81 MG PO CHEW: 81.0000 mg | CHEWABLE_TABLET | Freq: Every day | ORAL | Status: DC

## 2020-07-14 MED ORDER — HEPARIN SODIUM (PORCINE) 5000 UNIT/ML IJ SOLN
4000.0000 [IU] | Freq: Once | INTRAMUSCULAR | Status: AC
  Administered 2020-07-14: 4000 [IU] via INTRAVENOUS
  Filled 2020-07-14: qty 1

## 2020-07-14 MED ORDER — SODIUM CHLORIDE 0.9% FLUSH
3.0000 mL | Freq: Two times a day (BID) | INTRAVENOUS | Status: DC
  Administered 2020-07-15 – 2020-07-22 (×14): 3 mL via INTRAVENOUS

## 2020-07-14 MED ORDER — FENTANYL BOLUS VIA INFUSION
25.0000 ug | INTRAVENOUS | Status: DC | PRN
  Administered 2020-07-14 (×2): 25 ug via INTRAVENOUS
  Filled 2020-07-14: qty 25

## 2020-07-14 MED ORDER — MIDAZOLAM HCL 2 MG/2ML IJ SOLN: 1.0000 mg | INTRAMUSCULAR | Status: DC | PRN

## 2020-07-14 MED ORDER — VERAPAMIL HCL 2.5 MG/ML IV SOLN
INTRAVENOUS | Status: AC
  Filled 2020-07-14: qty 2

## 2020-07-14 MED ORDER — MIDAZOLAM HCL 2 MG/2ML IJ SOLN
1.0000 mg | INTRAMUSCULAR | Status: DC | PRN
  Filled 2020-07-14: qty 2

## 2020-07-14 MED ORDER — NITROGLYCERIN 1 MG/10 ML FOR IR/CATH LAB
INTRA_ARTERIAL | Status: DC | PRN
  Administered 2020-07-14: 150 ug via INTRACORONARY

## 2020-07-14 MED ORDER — ASPIRIN EC 81 MG PO TBEC: 81.0000 mg | DELAYED_RELEASE_TABLET | Freq: Every day | ORAL | Status: DC

## 2020-07-14 MED ORDER — IOHEXOL 350 MG/ML SOLN
INTRAVENOUS | Status: AC
  Filled 2020-07-14: qty 1

## 2020-07-14 MED ORDER — MIDAZOLAM HCL 2 MG/2ML IJ SOLN
INTRAMUSCULAR | Status: DC | PRN
  Administered 2020-07-14 (×2): 2 mg via INTRAVENOUS

## 2020-07-14 MED ORDER — SODIUM CHLORIDE 0.9 % IV SOLN: 4.0000 ug/kg/min | INTRAVENOUS | Status: AC

## 2020-07-14 MED ORDER — SODIUM CHLORIDE 0.9 % IV SOLN
3.0000 g | Freq: Three times a day (TID) | INTRAVENOUS | Status: DC
  Administered 2020-07-14 – 2020-07-15 (×4): 3 g via INTRAVENOUS
  Filled 2020-07-14 (×3): qty 3
  Filled 2020-07-14 (×3): qty 8

## 2020-07-14 MED ORDER — ORAL CARE MOUTH RINSE
15.0000 mL | OROMUCOSAL | Status: DC
  Administered 2020-07-14 – 2020-07-15 (×5): 15 mL via OROMUCOSAL

## 2020-07-14 MED ORDER — ETOMIDATE 2 MG/ML IV SOLN
INTRAVENOUS | Status: AC | PRN
  Administered 2020-07-14: 20 mg via INTRAVENOUS

## 2020-07-14 MED ORDER — SODIUM CHLORIDE 0.9% FLUSH
3.0000 mL | Freq: Two times a day (BID) | INTRAVENOUS | Status: DC
  Administered 2020-07-14 – 2020-07-22 (×15): 3 mL via INTRAVENOUS

## 2020-07-14 MED ORDER — CO Q 10 100 MG PO CAPS: 100.0000 mg | ORAL_CAPSULE | Freq: Every day | ORAL | Status: DC

## 2020-07-14 MED ORDER — SODIUM CHLORIDE 0.9 % IV SOLN: INTRAVENOUS | Status: AC

## 2020-07-14 MED ORDER — BIVALIRUDIN BOLUS VIA INFUSION - CUPID
INTRAVENOUS | Status: DC | PRN
  Administered 2020-07-14: 62.55 mg via INTRAVENOUS

## 2020-07-14 MED ORDER — BIVALIRUDIN TRIFLUOROACETATE 250 MG IV SOLR
INTRAVENOUS | Status: AC
  Filled 2020-07-14: qty 250

## 2020-07-14 MED ORDER — ONDANSETRON HCL 4 MG/2ML IJ SOLN
4.0000 mg | Freq: Four times a day (QID) | INTRAMUSCULAR | Status: DC | PRN
  Administered 2020-07-14 (×2): 4 mg via INTRAVENOUS
  Filled 2020-07-14 (×2): qty 2

## 2020-07-14 MED ORDER — HEPARIN (PORCINE) 25000 UT/250ML-% IV SOLN
900.0000 [IU]/h | INTRAVENOUS | Status: DC
  Administered 2020-07-14: 900 [IU]/h via INTRAVENOUS
  Filled 2020-07-14: qty 250

## 2020-07-14 MED ORDER — HEPARIN (PORCINE) IN NACL 1000-0.9 UT/500ML-% IV SOLN
INTRAVENOUS | Status: DC | PRN
  Administered 2020-07-14 (×2): 500 mL

## 2020-07-14 MED ORDER — DOCUSATE SODIUM 50 MG/5ML PO LIQD
100.0000 mg | Freq: Two times a day (BID) | ORAL | Status: DC
  Filled 2020-07-14: qty 10

## 2020-07-14 MED ORDER — INSULIN ASPART 100 UNIT/ML ~~LOC~~ SOLN
0.0000 [IU] | Freq: Every day | SUBCUTANEOUS | Status: DC
  Administered 2020-07-16 – 2020-07-20 (×2): 2 [IU] via SUBCUTANEOUS
  Administered 2020-07-21: 3 [IU] via SUBCUTANEOUS

## 2020-07-14 MED ORDER — ATORVASTATIN CALCIUM 10 MG PO TABS
20.0000 mg | ORAL_TABLET | Freq: Every day | ORAL | Status: DC
  Administered 2020-07-15: 20 mg via ORAL
  Filled 2020-07-14: qty 2

## 2020-07-14 MED ORDER — FENTANYL CITRATE (PF) 100 MCG/2ML IJ SOLN
25.0000 ug | Freq: Once | INTRAMUSCULAR | Status: AC
  Administered 2020-07-14: 25 ug via INTRAVENOUS

## 2020-07-14 MED ORDER — CANGRELOR BOLUS VIA INFUSION
INTRAVENOUS | Status: DC | PRN
  Administered 2020-07-14: 2502 ug via INTRAVENOUS

## 2020-07-14 MED ORDER — FENTANYL 2500MCG IN NS 250ML (10MCG/ML) PREMIX INFUSION
25.0000 ug/h | INTRAVENOUS | Status: DC
  Administered 2020-07-14: 25 ug/h via INTRAVENOUS
  Filled 2020-07-14: qty 250

## 2020-07-14 MED ORDER — NITROGLYCERIN 1 MG/10 ML FOR IR/CATH LAB
INTRA_ARTERIAL | Status: AC
  Filled 2020-07-14: qty 10

## 2020-07-14 MED ORDER — WARFARIN - PHARMACIST DOSING INPATIENT: Freq: Every day | Status: DC

## 2020-07-14 MED ORDER — CLOPIDOGREL BISULFATE 75 MG PO TABS: 75.0000 mg | ORAL_TABLET | Freq: Every day | ORAL | Status: DC

## 2020-07-14 MED ORDER — HYDRALAZINE HCL 20 MG/ML IJ SOLN: 10.0000 mg | INTRAMUSCULAR | Status: AC | PRN

## 2020-07-14 MED ORDER — LIDOCAINE HCL (PF) 1 % IJ SOLN
INTRAMUSCULAR | Status: AC
  Filled 2020-07-14: qty 30

## 2020-07-14 MED ORDER — SODIUM CHLORIDE 0.9 % IV BOLUS
500.0000 mL | Freq: Once | INTRAVENOUS | Status: AC
  Administered 2020-07-14: 500 mL via INTRAVENOUS

## 2020-07-14 SURGICAL SUPPLY — 20 items
BALLN SAPPHIRE 2.5X12 (BALLOONS) ×2
BALLN SAPPHIRE ~~LOC~~ 3.25X12 (BALLOONS) ×1 IMPLANT
BALLOON SAPPHIRE 2.5X12 (BALLOONS) IMPLANT
CATH INFINITI 5FR MULTPACK ANG (CATHETERS) ×1 IMPLANT
CATH VISTA GUIDE 6FR JR4 (CATHETERS) ×1 IMPLANT
CLOSURE PERCLOSE PROSTYLE (VASCULAR PRODUCTS) ×1 IMPLANT
KIT ENCORE 26 ADVANTAGE (KITS) ×1 IMPLANT
KIT HEART LEFT (KITS) ×2 IMPLANT
KIT HEMO VALVE WATCHDOG (MISCELLANEOUS) ×1 IMPLANT
KIT MICROPUNCTURE NIT STIFF (SHEATH) ×1 IMPLANT
MAT PREVALON FULL STRYKER (MISCELLANEOUS) ×1 IMPLANT
PACK CARDIAC CATHETERIZATION (CUSTOM PROCEDURE TRAY) ×2 IMPLANT
SHEATH PINNACLE 5F 10CM (SHEATH) IMPLANT
SHEATH PINNACLE 6F 10CM (SHEATH) ×1 IMPLANT
SHEATH PROBE COVER 6X72 (BAG) ×1 IMPLANT
STENT RESOLUTE ONYX 3.0X26 (Permanent Stent) ×1 IMPLANT
TRANSDUCER W/STOPCOCK (MISCELLANEOUS) ×2 IMPLANT
TUBING CIL FLEX 10 FLL-RA (TUBING) ×2 IMPLANT
WIRE EMERALD 3MM-J .035X150CM (WIRE) ×1 IMPLANT
WIRE HI TORQ WHISPER MS 190CM (WIRE) ×1 IMPLANT

## 2020-07-14 NOTE — ED Triage Notes (Signed)
Pt c/o chest pain with numbness to her left fingers that started just pta; pt also c/o some swelling to left foot and ankle

## 2020-07-14 NOTE — Consult Note (Signed)
NAME:  [name], MRN:  [id number], DOB:  07-04-1941, LOS: 0 ADMISSION DATE:  07/14/2020, CONSULTATION DATE:  07/14/20 REFERRING MD:  Sonia Side  CHIEF COMPLAINT:  SOB   Brief History   [name] is a 79 y.o. female who was transferred from [facility] and admitted to [facility] 10/1 with subtle ST changes and SOB.  She had acute pulmonary edema and vomiting in ED prompting intubation.  PCCM asked to assist with vent management.  History of present illness   Pt is encephelopathic; therefore, this HPI is obtained from chart review. [name] is a 79 y.o. female who has a PMH including but not limited to CAD, HTN, A.flutter, DM (see "past medical history" for rest).  She presented to [facility] ED 10/1 with left arm numbness and mild chest discomfort.  She had nonspecific ST changes and was subsequently transferred to [facility] for further evaluation by cardiology.  During transport, she had worsening dyspnea and hypoxia to 70's.  She was placed on NRB with improvement.  Unfortunately after arrival to [facility], she continued to have hypoxia and also had 1 episode of vomiting.  Decision was made to intubate her.  Cath lab has been postponed due to no active chest pain.  She is currently on heparin and nitro infusions.    Daughter is at the bedside.  She states pt would be ok with short term life support intubation / advanced therapies; however, would not want prolonged life sustaining measures.  Past Medical History  has HIP, ARTHRITIS, DEGEN./OSTEO; HIP PAIN; CLOSED FRACTURE OF SURGICAL NECK OF HUMERUS; Atrial flutter ([place]); Essential hypertension; CAD (coronary artery disease); Encounter for therapeutic drug monitoring; Facial droop; Long term (current) use of anticoagulants [Z79.01]; Left arm numbness; Chest pain; DM type 2 (diabetes mellitus, type 2) ([place]); CKD (chronic kidney disease), stage III ([place]); Carotid artery stenosis; Memory disorder; Derangement of posterior horn of medial meniscus of left knee; Derangement  of posterior horn of lateral meniscus of left knee; Primary osteoarthritis of left knee; S/P left knee arthroscopy 09/25/17; and Acute heart failure (HCC) on their problem list.  Significant Hospital Events   10/1 > admit.  Consults:  PCCM.  Procedures:  ETT 10/1 >   Significant Diagnostic Tests:  CXR 10/1 > edema, RML consolidation.  Micro Data:  COVID 10/1 > neg. Flu 10/1 > neg.  Antimicrobials:  Unasyn 10/1 >  Interim history/subjective:  Comfortable on vent.  Objective:  Blood pressure 135/60, pulse 75, temperature 98.3 F (36.8 C), temperature source Oral, resp. rate 15, height 5' 5.5" (1.664 m), weight 83.4 kg, SpO2 99 %.    Vent Mode: PRVC FiO2 (%):  [100 %] 100 % Set Rate:  [16 bmp] 16 bmp Vt Set:  [460 mL] 460 mL PEEP:  [5 cmH20] 5 cmH20 Plateau Pressure:  [23 cmH20] 23 cmH20  No intake or output data in the 24 hours ending 07/14/20 0352 Filed Weights   07/14/20 0026 07/14/20 0054  Weight: 72.6 kg 83.4 kg    Examination: General: Adult female, in NAD. Neuro: Sedated, not responsive. HEENT: [place]/AT. Sclerae anicteric.  ETT in place. Cardiovascular: RRR, no M/R/G.  Lungs: Respirations even and unlabored.  Coarse bilaterally R > L. Abdomen: BS x 4, soft, NT/ND.  Musculoskeletal: No gross deformities, no edema.  Skin: Intact, warm, no rashes.  Assessment & Plan:   Respiratory insufficiency - 2/2 acute pulmonary edema in the setting of ACS and complicated by vomiting episode with possible aspiration.  S/p lasix and  intubation in ED. - Full vent support. - Assess ABG. - Wean as able. - Daily SBT but would hold on any extubation until after cath lab. - Bronchial hygiene. - Empiric unasyn for now. - Sputum culture. - Follow CXR.  CAD with non specific ST changes. - Cath lab deferred per cards. - Continue heparin, nitro, ASA per cards. - F/u echo.  Hx HTN, HLD, A.flutter (on warfarin). - Nitro gtt. - Continue nitro. - Holding home amlodipine,  enalapril, HCTZ, metoprolol, warfarin.  Hx DM. - SSI. - Hold home metformin.  AoCKD. - Supportive care. - Follow BMP.   Best Practice:  Diet:  NPO. Pain/Anxiety/Delirium protocol (if indicated): Fentanyl gtt / midazolam PRN.  RASS goal -1. VAP protocol (if indicated): In place. DVT prophylaxis: SCD's / Heparin. GI prophylaxis: PPI. Glucose control: SSI. Mobility: Bedrest. Code Status: Full. Family Communication: Daughter updated at bedside. Disposition: ICU.  Labs   CBC: Recent Labs  Lab 07/14/20 0038  WBC 6.7  NEUTROABS 3.5  HGB 10.0*  HCT 32.9*  MCV 90.4  PLT 937   Basic Metabolic Panel: Recent Labs  Lab 07/14/20 0041  NA 140  K 4.1  CL 105  CO2 25  GLUCOSE 203*  BUN 34*  CREATININE 1.47*  CALCIUM 9.2   GFR: Estimated Creatinine Clearance: 32.9 mL/min (A) (by C-G formula based on SCr of 1.47 mg/dL (H)). Recent Labs  Lab 07/14/20 0038  WBC 6.7   Liver Function Tests: No results for input(s): AST, ALT, ALKPHOS, BILITOT, PROT, ALBUMIN in the last 168 hours. No results for input(s): LIPASE, AMYLASE in the last 168 hours. No results for input(s): AMMONIA in the last 168 hours. ABG No results found for: PHART, PCO2ART, PO2ART, HCO3, TCO2, ACIDBASEDEF, O2SAT  Coagulation Profile: Recent Labs  Lab 07/14/20 0038  INR 1.0   Cardiac Enzymes: No results for input(s): CKTOTAL, CKMB, CKMBINDEX, TROPONINI in the last 168 hours. HbA1C: Hgb A1c MFr Bld  Date/Time Value Ref Range Status  03/25/2017 05:56 AM 7.2 (H) 4.8 - 5.6 % Final    Comment:    (NOTE)         Pre-diabetes: 5.7 - 6.4         Diabetes: >6.4         Glycemic control for adults with diabetes: <7.0   10/11/2014 06:51 AM 6.2 (H) <5.7 % Final    Comment:    (NOTE)                                                                       According to the ADA Clinical Practice Recommendations for 2011, when HbA1c is used as a screening test:  >=6.5%   Diagnostic of Diabetes Mellitus            (if abnormal result is confirmed) 5.7-6.4%   Increased risk of developing Diabetes Mellitus References:Diagnosis and Classification of Diabetes Mellitus,Diabetes TKWI,0973,53(GDJME 1):S62-S69 and Standards of Medical Care in         Diabetes - 2011,Diabetes QAST,4196,22 (Suppl 1):S11-S61.    CBG: No results for input(s): GLUCAP in the last 168 hours.  Review of Systems:   Unable to obtain as pt is encephalopathic.  Past medical history  She,  has a past medical history  of Arthritis, Atrial flutter ([place]), Bilateral pulmonary embolism ([place]) (08/2008), Coronary artery disease, Coronary atherosclerosis, Diabetes mellitus without complication ([place]), Hyperlipidemia, Hypertension, Ischemic cardiomyopathy, Memory disorder (07/08/2017), Myocardial infarction [facility]), Stroke ([place]), Unspecified combined systolic and diastolic heart failure, and Unspecified combined systolic and diastolic heart failure.   Surgical History    Past Surgical History:  Procedure Laterality Date  . ATRIAL FLUTTER ABLATION N/A 06/17/2013   Procedure: ATRIAL FLUTTER ABLATION;  Surgeon: Thompson Grayer, MD;  Location: [facility] CATH LAB;  Service: Cardiovascular;  Laterality: N/A;  . CAROTID PTA/STENT INTERVENTION N/A 04/10/2017   Procedure: Carotid PTA/Stent Intervention;  Surgeon: Algernon Huxley, MD;  Location: [place] CV LAB;  Service: Cardiovascular;  Laterality: N/A;  . CHOLECYSTECTOMY    . HIP SURGERY    . KNEE ARTHROSCOPY WITH MEDIAL MENISECTOMY Left 09/25/2017   Procedure: KNEE ARTHROSCOPY WITH MEDIAL AND LATERAL MENISECTOMY;  Surgeon: [name], MD;  Location: [facility] ORS;  Service: Orthopedics;  Laterality: Left;     Social History   reports that she quit smoking about 41 years ago. Her smoking use included cigarettes. She quit after 10.00 years of use. She has never used smokeless tobacco. She reports that she does not drink alcohol and does not use drugs.   Family history   Her family history includes Arthritis  in an other family member; Cancer in an other family member; Diabetes in an other family member; Heart disease in an other family member; Heart failure in her mother.   Allergies No Known Allergies   Home meds  Prior to Admission medications   Medication Sig Start Date End Date Taking? Authorizing Provider  amLODipine (NORVASC) 5 MG tablet Take 5 mg by mouth daily.   Yes [provider]  Ascorbic Acid (VITAMIN C GUMMIES PO) Take 2 each by mouth daily. 180 mg   Yes [provider]  aspirin EC 81 MG tablet Take 81 mg by mouth daily.   Yes [provider]  atorvastatin (LIPITOR) 10 MG tablet Take 10 mg by mouth daily.   Yes [provider]  Coenzyme Q10 (CO Q 10) 100 MG CAPS Take 100 mg by mouth daily.   Yes [provider]  enalapril (VASOTEC) 10 MG tablet Take 1 tablet (10 mg total) by mouth 2 (two) times daily. 05/27/14  Yes Belva Crome, MD  hydrochlorothiazide (HYDRODIURIL) 25 MG tablet Take 25 mg by mouth daily.   Yes [provider]  MEGARED OMEGA-3 KRILL OIL PO Take 1,000 mg by mouth daily. When she remembers   Yes [provider]  metFORMIN (GLUCOPHAGE) 500 MG tablet Take 1 tablet (500 mg total) by mouth 2 (two) times daily with a meal. Patient taking differently: Take 1,000 mg by mouth 2 (two) times daily with a meal.  10/12/14  Yes Sinda Du, MD  metoprolol (LOPRESSOR) 50 MG tablet Take 50 mg by mouth 2 (two) times daily.   Yes [provider]  warfarin (COUMADIN) 5 MG tablet Take 5-7.5 mg by mouth daily at 6 PM. Takes 7.5 mg every day except on Wednesday and Saturdays takes 5 mg   Yes [provider]  nitroGLYCERIN (NITROSTAT) 0.4 MG SL tablet Place 0.4 mg under the tongue every 5 (five) minutes x 3 doses as needed for chest pain.     [provider]    Critical care time: 40 min.    Montey Hora, [facility] 07/14/2020, 3:52 AM

## 2020-07-14 NOTE — H&P (Addendum)
Cardiology Admission History and Physical:   Patient ID: [name] MRN: [id number]; DOB: 1941/02/09   Admission date: 07/14/2020  Primary Care Provider: Celene Squibb, MD [facility] Cardiologist: Dorris Carnes, MD  [facility] Electrophysiologist:  None   Chief Complaint:  Left arm numbness, shortness of breath  Patient Profile:   [name] is a 79 y.o. female with history of CAD s/p multiple PCI, HTN, HLD, remote bilateral PE on warfarin and HFpEF who presents with left arm numbness and shortness of breath.  History of Present Illness:   [name] was in her usual state of health this evening until she developed discomfort in her left arm and numbness in her left fingertips. She called her daughter at 11:45pm complaining of these symptoms. On arrival to [place], she had nonspecific ST segment changes and the STEMI line was activated. She did well during transport to [facility] and was stable on room air. On arrival to [facility], she denied any chest pain but endorsed significant shortness of breath. She was found to be sating in the 70s and was placed on a nonrebreather. She endorsed new swelling in her legs and ongoing numbness in her left finger tips. She denies any palpitations, lightheadedness, dizziness or nausea. She reports compliance with her home medications. She did have an abrasion on her arm after falling with her dog and held her coumadin for a day per her daughter.    Past Medical History:  Diagnosis Date  . Arthritis   . Atrial flutter ([place])    typical appearing  . Bilateral pulmonary embolism ([place]) 08/2008   On coumadin  . Coronary artery disease    s/p DES to mid RCA in 02/2008; s/p inferior MI with DES to mid RCA and mid LAD in 05/2005  . Coronary atherosclerosis   . Diabetes mellitus without complication ([place])   . Hyperlipidemia   . Hypertension    Controlled  . Ischemic cardiomyopathy   . Memory disorder 07/08/2017  . Myocardial infarction  ([place])   . Stroke ([place])   . Unspecified combined systolic and diastolic heart failure    Asymptomatic. Echo 04/28/13 EF= 40- 45%  . Unspecified combined systolic and diastolic heart failure     Past Surgical History:  Procedure Laterality Date  . ATRIAL FLUTTER ABLATION N/A 06/17/2013   Procedure: ATRIAL FLUTTER ABLATION;  Surgeon: Thompson Grayer, MD;  Location: [facility] CATH LAB;  Service: Cardiovascular;  Laterality: N/A;  . CAROTID PTA/STENT INTERVENTION N/A 04/10/2017   Procedure: Carotid PTA/Stent Intervention;  Surgeon: Algernon Huxley, MD;  Location: [place] CV LAB;  Service: Cardiovascular;  Laterality: N/A;  . CHOLECYSTECTOMY    . HIP SURGERY    . KNEE ARTHROSCOPY WITH MEDIAL MENISECTOMY Left 09/25/2017   Procedure: KNEE ARTHROSCOPY WITH MEDIAL AND LATERAL MENISECTOMY;  Surgeon: [name], MD;  Location: [facility] ORS;  Service: Orthopedics;  Laterality: Left;     Medications Prior to Admission: Prior to Admission medications   Medication Sig Start Date End Date Taking? Authorizing Provider  amLODipine (NORVASC) 5 MG tablet Take 5 mg by mouth daily.   Yes [provider]  Ascorbic Acid (VITAMIN C GUMMIES PO) Take 2 each by mouth daily. 180 mg   Yes [provider]  aspirin EC 81 MG tablet Take 81 mg by mouth daily.   Yes [provider]  atorvastatin (LIPITOR) 10 MG tablet Take 10 mg by mouth daily.   Yes [provider]  Coenzyme  Q10 (CO Q 10) 100 MG CAPS Take 100 mg by mouth daily.   Yes [provider]  enalapril (VASOTEC) 10 MG tablet Take 1 tablet (10 mg total) by mouth 2 (two) times daily. 05/27/14  Yes Belva Crome, MD  hydrochlorothiazide (HYDRODIURIL) 25 MG tablet Take 25 mg by mouth daily.   Yes [provider]  MEGARED OMEGA-3 KRILL OIL PO Take 1,000 mg by mouth daily. When she remembers   Yes [provider]  metFORMIN (GLUCOPHAGE) 500 MG tablet Take 1 tablet (500 mg total) by mouth 2 (two) times daily with a  meal. Patient taking differently: Take 1,000 mg by mouth 2 (two) times daily with a meal.  10/12/14  Yes Sinda Du, MD  metoprolol (LOPRESSOR) 50 MG tablet Take 50 mg by mouth 2 (two) times daily.   Yes [provider]  warfarin (COUMADIN) 5 MG tablet Take 5-7.5 mg by mouth daily at 6 PM. Takes 7.5 mg every day except on Wednesday and Saturdays takes 5 mg   Yes [provider]  nitroGLYCERIN (NITROSTAT) 0.4 MG SL tablet Place 0.4 mg under the tongue every 5 (five) minutes x 3 doses as needed for chest pain.     [provider]     Allergies:   No Known Allergies  Social History:   Lives by herself. Husband passed away in 03-02-20. Supportive children nearby. No recent alcohol, drug or tobacco use.   Family History:   The patient's family history includes Arthritis in an other family member; Cancer in an other family member; Diabetes in an other family member; Heart disease in an other family member; Heart failure in her mother.    ROS:  Please see the history of present illness.  All other ROS reviewed and negative.     Physical Exam/Data:   Vitals:   07/14/20 0230 07/14/20 0234 07/14/20 0236 07/14/20 0240  BP: (!) 182/78 (!) 169/77 (!) 159/73 (!) 158/80  Pulse: 90 93 91 91  Resp: (!) 36 (!) 30 (!) 32 (!) 28  Temp:      TempSrc:      SpO2: 100% 100% 99% 99%  Weight:      Height:       No intake or output data in the 24 hours ending 07/14/20 0241 Last 3 Weights 07/14/2020 07/14/2020 07/15/2018  Weight (lbs) 183 lb 13.8 oz 160 lb 179 lb  Weight (kg) 83.4 kg 72.576 kg 81.194 kg     Body mass index is 30.13 kg/m.  General:  Elderly, lying in bed, appears ill HEENT: normal Neck: JVP difficult to detect given work of breathing  Cardiac:  normal S1, S2; RRR; difficult to detect any murmurs given coarse breath sounds  Lungs: Tachypneic, increased work of breathing. Crackles heard throughout  Abd: soft  Ext: 1+ pitting edema bilaterally    Musculoskeletal:  No deformities, BUE and BLE strength normal and equal Skin: warm and dry  Neuro:  CNs 2-12 intact, no focal abnormalities noted Psych:  Normal affect    EKG:  The ECG that was done was personally reviewed and demonstrates sinus rhythm with one PVC. Nonspecific ST segment changes.   Relevant CV Studies: Echo 03/25/17 - Left ventricle: The cavity size was normal. There was moderate  concentric hypertrophy. Systolic function was normal. The  estimated ejection fraction was 55%. Wall motion was normal;  there were no regional wall motion abnormalities. Features are  consistent with a pseudonormal left ventricular filling pattern,  with concomitant abnormal relaxation and increased filling  pressure (grade 2 diastolic dysfunction). Doppler parameters are  consistent with high ventricular filling pressure.  - Aortic valve: Moderately calcified annulus. Trileaflet; mildly  thickened, mildly calcified leaflets. There was mild stenosis.  Peak velocity (S): 221 cm/s. Mean gradient (S): 10 mm Hg. Valve  area (VTI): 1.78 cm^2.  - Mitral valve: Moderately calcified, moderately to severely  thickened annulus. Normal thickness leaflets .  - Left atrium: The atrium was mildly dilated. Volume/bsa, ES  (1-plane Simpson&'s, A4C): 37.5 ml/m^2.  - Atrial septum: No defect or patent foramen ovale was identified.   Laboratory Data:  High Sensitivity Troponin:   Recent Labs  Lab 07/14/20 0041  TROPONINIHS 264*      Chemistry Recent Labs  Lab 07/14/20 0041  NA 140  K 4.1  CL 105  CO2 25  GLUCOSE 203*  BUN 34*  CREATININE 1.47*  CALCIUM 9.2  GFRNONAA 33*  GFRAA 39*  ANIONGAP 10    No results for input(s): PROT, ALBUMIN, AST, ALT, ALKPHOS, BILITOT in the last 168 hours. Hematology Recent Labs  Lab 07/14/20 0038  WBC 6.7  RBC 3.64*  HGB 10.0*  HCT 32.9*  MCV 90.4  MCH 27.5  MCHC 30.4  RDW 14.5  PLT 338   BNPNo results for input(s):  BNP, PROBNP in the last 168 hours.  DDimer No results for input(s): DDIMER in the last 168 hours.   Radiology/Studies:  DG Chest Port 1 View  Result Date: 07/14/2020 CLINICAL DATA:  Shortness of breath. Chest pain and numbness to the left fingers. Swelling in the left foot and ankle EXAM: PORTABLE CHEST 1 VIEW COMPARISON:  07/14/2020 FINDINGS: Shallow inspiration. Cardiac enlargement. Infiltration and consolidation in the right upper lung likely representing pneumonia. Perihilar infiltrates and interstitial changes may represent edema or multifocal pneumonia. No pleural effusions. No pneumothorax. Calcification of the aorta. Degenerative changes in the spine and shoulders. IMPRESSION: Cardiac enlargement with bilateral perihilar infiltrates and consolidation in the right upper lung. There is progression since the previous study from earlier today. Electronically Signed   By: Lucienne Capers M.D.   On: 07/14/2020 02:25   DG Chest Portable 1 View  Result Date: 07/14/2020 CLINICAL DATA:  Chest pain. EXAM: PORTABLE CHEST 1 VIEW COMPARISON:  October 11, 2014 FINDINGS: Mild to moderate severity diffuse chronic appearing increased interstitial lung markings are noted. There is no evidence of focal consolidation, pleural effusion or pneumothorax. The cardiac silhouette is moderately enlarged. There is marked severity calcification of the aortic arch. A chronic deformity is seen involving the right humeral head. IMPRESSION: Chronic appearing increased interstitial lung markings without evidence of acute or active cardiopulmonary disease. Electronically Signed   By: Virgina Norfolk M.D.   On: 07/14/2020 00:52    TIMI Risk Score for Unstable Angina or Non-ST Elevation MI:   The patient's TIMI risk score is 5, which indicates a 26% risk of all cause mortality, new or recurrent myocardial infarction or need for urgent revascularization in the next 14 days.   New York Heart Association (NYHA) Functional  Class NYHA Class IV  Assessment and Plan:   1. Acute hypoxic respiratory failure- She presents with left arm numbness and tingling and subtle ST segment changes concerning for ischemia. She developed flash pulmonary edema on arrival to the ER. Given her lack of chest pain, LHC will be deferred until her respiratory failure is improved. - Lasix 40mg  IV  - Nitro drip  - Heparin, ACS nomogram  -  Bipap therapy - Monitor in ICU - Strict I/Os  2. CAD- Patient with known CAD (DES to RCA and mLAD 2006, repeat DES to RCA 2009). She has subtle ST segment changes on ECG. Given her lack of chest pain, will defer cath until clinically stable as above. - Heparin, ACS nomogram - S/p aspirin 324mg  - Continue aspirin 81mg  - Hold home metoprolol given acute decompensation - Echo ordered - Lipitor increased to 20mg  - Nitro drip as above  - NPO for possible LHC in AM  3. HTN - Hold home metoprolol given acute decompensation - Hold home amlodipine - Hold home enalapril given AKI - Nitro drip   4. Hyperlipidemia - Increase lipitor to 20mg    5. Diabetes - Hold home metformin - Sliding scale insulin    Code status: Full code, patient would not like to be kept alive for a prolonged period of time on a ventilator.   Severity of Illness: The appropriate patient status for this patient is INPATIENT. Inpatient status is judged to be reasonable and necessary in order to provide the required intensity of service to ensure the patient's safety. The patient's presenting symptoms, physical exam findings, and initial radiographic and laboratory data in the context of their chronic comorbidities is felt to place them at high risk for further clinical deterioration. Furthermore, it is not anticipated that the patient will be medically stable for discharge from the hospital within 2 midnights of admission. The following factors support the patient status of inpatient.   " The patient's presenting symptoms  include shortness of breath. " The worrisome physical exam findings include increase work of breathing. " The initial radiographic and laboratory data are worrisome because of elevated troponin. " The chronic co-morbidities include known CAD.   * I certify that at the point of admission it is my clinical judgment that the patient will require inpatient hospital care spanning beyond 2 midnights from the point of admission due to high intensity of service, high risk for further deterioration and high frequency of surveillance required.*    For questions or updates, please contact [place] Please consult [email or web address] for contact info under     Signed, Princella Pellegrini, MD  07/14/2020 2:41 AM      Addendum: Around 3:15am, I check on the patient again. She continued to be significantly short of breath and tachypneic with diffuse crackles throughout. While speaking with her, she developed bradycardia in the 40s and began vomiting with acute drop in her blood pressure. Her nitro drip was paused and after several episodes of emesis her heart rate and blood pressure recovered. Given her work of breathing and inability to tolerate bipap with her nausea, we elected to move forward with intubation after discussion with the patient and her daughter. Repeat ECG prior to admission showed continued nonspecific ST segment changes inferiorly with worsening ST depression in V2 and V3 concerning for ongoing inferior/posterior ischemia. Her repeat troponin resulted at 315 from 264. I discussed her case with Dr. Tamala Julian, the interventionalist on call. He recommended continued medical management given the absence of ST elevation. She will be admitted to the ICU. The critical care team has been consulted for assistance with her management.

## 2020-07-14 NOTE — Progress Notes (Signed)
ANTICOAGULATION CONSULT NOTE - Initial Consult  Pharmacy Consult for warfarin Indication: atrial fibrillation  No Known Allergies  Patient Measurements: Height: 5' 5.5" (166.4 cm) Weight: 83.4 kg (183 lb 13.8 oz) IBW/kg (Calculated) : 58.15  Vital Signs: BP: 122/52 (10/01 1500) Pulse Rate: 69 (10/01 1500)  Labs: Recent Labs    07/14/20 0038 07/14/20 0041 07/14/20 0041 07/14/20 0212 07/14/20 0428 07/14/20 0730 07/14/20 1147  HGB 10.0*  --   --   --  11.9*  --   --   HCT 32.9*  --   --   --  35.0*  --   --   PLT 338  --   --   --   --   --   --   APTT 24  --   --   --   --   --   --   LABPROT 12.8  --   --   --   --   --   --   INR 1.0  --   --   --   --   --   --   HEPARINUNFRC  --   --   --   --   --   --  0.38  CREATININE  --  1.47*  --   --   --   --  1.57*  TROPONINIHS  --  264*   < > 315*  --  604* 3,485*   < > = values in this interval not displayed.    Estimated Creatinine Clearance: 30.8 mL/min (A) (by C-G formula based on SCr of 1.57 mg/dL (H)).   Medical History: Past Medical History:  Diagnosis Date  . Arthritis   . Atrial flutter ([place])    typical appearing  . Bilateral pulmonary embolism ([place]) 08/2008   On coumadin  . Coronary artery disease    s/p DES to mid RCA in 02/2008; s/p inferior MI with DES to mid RCA and mid LAD in 05/2005  . Coronary atherosclerosis   . Diabetes mellitus without complication ([place])   . Hyperlipidemia   . Hypertension    Controlled  . Ischemic cardiomyopathy   . Memory disorder 07/08/2017  . Myocardial infarction ([place])   . Stroke ([place])   . Unspecified combined systolic and diastolic heart failure    Asymptomatic. Echo 04/28/13 EF= 40- 45%  . Unspecified combined systolic and diastolic heart failure     Medications:  Scheduled:  . [START ON 07/15/2020] aspirin  81 mg Per Tube Daily  . atorvastatin  20 mg Oral Daily  . chlorhexidine gluconate (MEDLINE KIT)  15 mL Mouth Rinse BID  . Chlorhexidine Gluconate Cloth  6  each Topical Daily  . clopidogrel  600 mg Per Tube Daily  . [START ON 07/15/2020] clopidogrel  75 mg Per Tube Q breakfast  . docusate  100 mg Oral BID  . insulin aspart  0-15 Units Subcutaneous TID WC  . insulin aspart  0-5 Units Subcutaneous QHS  . mouth rinse  15 mL Mouth Rinse 10 times per day  . pantoprazole (PROTONIX) IV  40 mg Intravenous Daily  . polyethylene glycol  17 g Oral Daily  . sodium chloride flush  3 mL Intravenous Q12H  . sodium chloride flush  3 mL Intravenous Q12H    Assessment: 55 yof who presenting with question ST elevation, which resolved in setting of acute hypoxic resp failure likely secondary to pulm edema. On warfarin PTA - admission INR 1 (unknown timing of  last dose).   Was on heparin prior to cath where received DES to RCA. Hgb 11.9, plt 338. Trop 604 to 3485. No s/sx of bleeding.   PTA regimen 5 mg daily except 7.5 mg qSat  Goal of Therapy:  INR 2-3 Monitor platelets by anticoagulation protocol: Yes   Plan:  Warfarin 5 mg tonight Monitor INR, CBC, and for s/sx of bleeding  F/u plans for triple therapy   Antonietta Jewel, PharmD, [place] Pharmacist  Phone: [phone] 07/14/2020 3:39 PM  Please check AMION for all [facility] phone numbers After 10:00 PM, call [place] [phone]

## 2020-07-14 NOTE — Plan of Care (Addendum)
Patient seen in the ED- awake, agitated, minimally sedated on MV. Tolerating 40% FiO2 with saturations 98%. Per Cardiology EF remains at her baseline on bedside echo; formal echo pending. Daughter at bedside relates that her mother has been very depressed since her husband passed away in March 21, 2023. Her daughter and son and his wife have been frequently at her house to help care for her.   Plan:  sedated to tolerate MV until she can be moved to ICU. SBT today. Planning for SB today. Restraints placed for safety. Con't lasix and heparin. Off NTG due to hypotension. Will continue to discuss potential plans for intervention with Cardiology. Trend lactate and troponin.  Daughter updated in the ED.  This patient is critically ill with multiple organ system failure which requires frequent high complexity decision making, assessment, support, evaluation, and titration of therapies. This was completed through the application of advanced monitoring technologies and extensive interpretation of multiple databases. During this encounter critical care time was devoted to patient care services described in this note for 31 minutes.   Julian Hy, DO 07/14/20 8:35 AM [facility]

## 2020-07-14 NOTE — ED Notes (Signed)
Dr. Shearon Stalls notified of hypotension; verbal order give for 564mL bolus NS

## 2020-07-14 NOTE — ED Notes (Signed)
Attempted report 

## 2020-07-14 NOTE — Progress Notes (Signed)
RT transported pt with RN and EMT from [place] to [facility]. Pt tolerated well with SVS.

## 2020-07-14 NOTE — ED Provider Notes (Addendum)
Patient arrived from [facility] as a code STEMI.  She arrives actually stating that she is pain-free but has developed dyspnea as she comes in the door.  She has tachypnea and is hypoxic on room air.  Lungs have diffuse rales consistent with acute pulmonary edema.  She is noted to have 1+ peripheral edema as well.  ECG is repeated and ST segments are no longer elevated, code STEMI is canceled.  She is given intravenous furosemide and intravenous nitroglycerin and will need to be admitted to ICU.  Troponin was elevated at 248.  She probably will still need cardiac catheterization once her respiratory status has been stabilized.  3:07 AM Chest x-ray does show progression of pulmonary edema.  He is maintaining adequate oxygen saturations on supplemental oxygen, but is starting to fatigue.  Will start on BiPAP.  3:44 AM Patient started vomiting, not a candidate for BiPAP based on emesis.  She is fatigued, decision is made to intubate.  Case discussed with Dr. Lucile Shutters of critical care service, they will be on consult for ventilator management.   EKG Interpretation  Date/Time:  Friday July 14 2020 01:48:26 EDT Ventricular Rate:  96 PR Interval:  170 QRS Duration: 129 QT Interval:  359 QTC Calculation: 454 R Axis:   104 Text Interpretation: Sinus rhythm Ventricular premature complex Nonspecific intraventricular conduction delay Probable anterolateral infarct, old Repol abnrm suggests ischemia, lateral leads When compared with ECG of EARLIER SAME DATE ST elevation in Inferior leads is no longer present ST depression in Lateral leads is unchanged Confirmed by Delora Fuel ([id number]) on 07/14/2020 2:06:28 AM        CRITICAL CARE Performed by: Delora Fuel Total critical care time: 55 minutes Critical care time was exclusive of separately billable procedures and treating other patients. Critical care was necessary to treat or prevent imminent or life-threatening deterioration. Critical care was  time spent personally by me on the following activities: development of treatment plan with patient and/or surrogate as well as nursing, discussions with consultants, evaluation of patient's response to treatment, examination of patient, obtaining history from patient or surrogate, ordering and performing treatments and interventions, ordering and review of laboratory studies, ordering and review of radiographic studies, pulse oximetry and re-evaluation of patient's condition.  Procedure Name: Intubation Date/Time: 07/14/2020 3:30 AM Performed by: Delora Fuel, MD Pre-anesthesia Checklist: Patient identified, Patient being monitored, Emergency Drugs available, Timeout performed and Suction available Oxygen Delivery Method: Non-rebreather mask Preoxygenation: Pre-oxygenation with 100% oxygen Induction Type: Rapid sequence Ventilation: Mask ventilation without difficulty Laryngoscope Size: Glidescope and 3 Grade View: Grade I Tube size: 7.0 mm Number of attempts: 1 Airway Equipment and Method: Rigid stylet and Video-laryngoscopy Placement Confirmation: ETT inserted through vocal cords under direct vision,  CO2 detector and Breath sounds checked- equal and bilateral Secured at: 23 cm Tube secured with: ETT holder Dental Injury: Teeth and Oropharynx as per pre-operative assessment          Delora Fuel, MD 02/72/53 [id number]    Delora Fuel, MD 03/47/42 [phone]

## 2020-07-14 NOTE — Progress Notes (Signed)
Received report from [place], Pt going to cath lab for procedure. Pt had vomiting episode and was combative Fentanyl gtt increased and zofran given per orders.

## 2020-07-14 NOTE — Procedures (Signed)
Extubation Procedure Note  Patient Details:   Name: [name] DOB: 1941/09/02 MRN: [id number]   Airway Documentation:    Vent end date: 07/14/20 Vent end time: 1622   Evaluation  O2 sats: stable throughout Complications: No apparent complications Patient did tolerate procedure well. Bilateral Breath Sounds: Diminished   Pt extubated to BIPAP 5/5 per MD order. NIF was -40 and VC was 555ml. Pt had positive cuff leak. Pt able to voice her name. RT to continue to monitor.  Vilinda Blanks 07/14/2020, 4:28 PM

## 2020-07-14 NOTE — H&P (View-Only) (Signed)
Cardiology Progress Note  Patient ID: [name] MRN: [id number] DOB: 12/23/1940 Date of Encounter: 07/14/2020  Primary Cardiologist: Dorris Carnes, MD  Subjective   Chief Complaint: Intubated on vent.  Is.  Follow commands.  HPI: Intubated overnight for flash pulmonary edema.  On minimal O2 requirements.  Following commands.  ST elevation has resolved.  ROS:  All other ROS reviewed and negative. Pertinent positives noted in the HPI.     Inpatient Medications  Scheduled Meds: . aspirin EC  81 mg Oral Daily  . atorvastatin  20 mg Oral Daily  . docusate  100 mg Oral BID  . insulin aspart  0-15 Units Subcutaneous TID WC  . insulin aspart  0-5 Units Subcutaneous QHS  . pantoprazole (PROTONIX) IV  40 mg Intravenous Daily  . polyethylene glycol  17 g Oral Daily  . sodium chloride flush  3 mL Intravenous Q12H   Continuous Infusions: . sodium chloride    . sodium chloride    . ampicillin-sulbactam (UNASYN) IV Stopped (07/14/20 0726)  . fentaNYL infusion INTRAVENOUS 25 mcg/hr (07/14/20 0411)  . heparin 900 Units/hr (07/14/20 0249)  . nitroGLYCERIN Stopped (07/14/20 0522)  . propofol (DIPRIVAN) infusion 20 mcg/kg/min (07/14/20 0801)   PRN Meds: sodium chloride, acetaminophen, fentaNYL, midazolam, midazolam, ondansetron (ZOFRAN) IV, sodium chloride flush   Vital Signs   Vitals:   07/14/20 0725 07/14/20 0730 07/14/20 0750 07/14/20 0815  BP: (!) 123/56 (!) 115/55 135/77 (!) 95/52  Pulse: 71 67 75 71  Resp: (!) 21 (!) 21 (!) 26 18  Temp:      TempSrc:      SpO2: 100% 100% 100% 94%  Weight:      Height:        Intake/Output Summary (Last 24 hours) at 07/14/2020 0824 Last data filed at 07/14/2020 0726 Gross per 24 hour  Intake 652.86 ml  Output --  Net 652.86 ml   Last 3 Weights 07/14/2020 07/14/2020 07/15/2018  Weight (lbs) 183 lb 13.8 oz 160 lb 179 lb  Weight (kg) 83.4 kg 72.576 kg 81.194 kg      Telemetry  Overnight telemetry shows sinus rhythm with heart rate in  70s, which I personally reviewed.   ECG  The most recent ECG shows twelve-lead tracing obtained from telemetry demonstrates normal sinus rhythm, heart rate 73, nonspecific IVCD, inferior lateral T wave inversions noted, which I personally reviewed.   Physical Exam   Vitals:   07/14/20 0725 07/14/20 0730 07/14/20 0750 07/14/20 0815  BP: (!) 123/56 (!) 115/55 135/77 (!) 95/52  Pulse: 71 67 75 71  Resp: (!) 21 (!) 21 (!) 26 18  Temp:      TempSrc:      SpO2: 100% 100% 100% 94%  Weight:      Height:         Intake/Output Summary (Last 24 hours) at 07/14/2020 0824 Last data filed at 07/14/2020 0726 Gross per 24 hour  Intake 652.86 ml  Output --  Net 652.86 ml    Last 3 Weights 07/14/2020 07/14/2020 07/15/2018  Weight (lbs) 183 lb 13.8 oz 160 lb 179 lb  Weight (kg) 83.4 kg 72.576 kg 81.194 kg    Body mass index is 30.13 kg/m.  General: Intubated on vent Head: Atraumatic, normal size  Eyes: PEERLA, EOMI  Neck: Supple, no JVD Endocrine: No thryomegaly Cardiac: Normal S1, S2; RRR; no murmurs, rubs, or gallops Lungs: Diffuse rhonchi bilaterally Abd: Soft, nontender, no hepatomegaly  Ext: Trace edema Musculoskeletal: No deformities, BUE  and BLE strength normal and equal Skin: Warm and dry, no rashes   Neuro: Alert and oriented to person, place, time, and situation, CNII-XII grossly intact, no focal deficits  Psych: Normal mood and affect   Labs  High Sensitivity Troponin:   Recent Labs  Lab 07/14/20 0041 07/14/20 0212  TROPONINIHS 264* 315*     Cardiac EnzymesNo results for input(s): TROPONINI in the last 168 hours. No results for input(s): TROPIPOC in the last 168 hours.  Chemistry Recent Labs  Lab 07/14/20 0041 07/14/20 0428  NA 140 140  K 4.1 4.4  CL 105  --   CO2 25  --   GLUCOSE 203*  --   BUN 34*  --   CREATININE 1.47*  --   CALCIUM 9.2  --   GFRNONAA 33*  --   GFRAA 39*  --   ANIONGAP 10  --     Hematology Recent Labs  Lab 07/14/20 0038 07/14/20 0428   WBC 6.7  --   RBC 3.64*  --   HGB 10.0* 11.9*  HCT 32.9* 35.0*  MCV 90.4  --   MCH 27.5  --   MCHC 30.4  --   RDW 14.5  --   PLT 338  --    BNP Recent Labs  Lab 07/14/20 0212  BNP 397.2*    DDimer No results for input(s): DDIMER in the last 168 hours.   Radiology  DG Chest Portable 1 View  Result Date: 07/14/2020 CLINICAL DATA:  OG and endotracheal tube placements EXAM: PORTABLE CHEST 1 VIEW COMPARISON:  07/14/2020 FINDINGS: Endotracheal tube placed with tip measuring 4.8 cm above the carina. Enteric tube tip is off the field of view but below the left hemidiaphragm. Cardiac enlargement. Perihilar infiltration or edema. Right upper lung consolidation is improving since prior study. No pleural effusions. No pneumothorax. Calcified and tortuous aorta. IMPRESSION: Appliances appear in satisfactory position. Cardiac enlargement with perihilar infiltration or edema. Improving consolidation in the right upper lung. Electronically Signed   By: Lucienne Capers M.D.   On: 07/14/2020 03:47   DG Chest Port 1 View  Result Date: 07/14/2020 CLINICAL DATA:  Shortness of breath. Chest pain and numbness to the left fingers. Swelling in the left foot and ankle EXAM: PORTABLE CHEST 1 VIEW COMPARISON:  07/14/2020 FINDINGS: Shallow inspiration. Cardiac enlargement. Infiltration and consolidation in the right upper lung likely representing pneumonia. Perihilar infiltrates and interstitial changes may represent edema or multifocal pneumonia. No pleural effusions. No pneumothorax. Calcification of the aorta. Degenerative changes in the spine and shoulders. IMPRESSION: Cardiac enlargement with bilateral perihilar infiltrates and consolidation in the right upper lung. There is progression since the previous study from earlier today. Electronically Signed   By: Lucienne Capers M.D.   On: 07/14/2020 02:25   DG Chest Portable 1 View  Result Date: 07/14/2020 CLINICAL DATA:  Chest pain. EXAM: PORTABLE CHEST 1 VIEW  COMPARISON:  October 11, 2014 FINDINGS: Mild to moderate severity diffuse chronic appearing increased interstitial lung markings are noted. There is no evidence of focal consolidation, pleural effusion or pneumothorax. The cardiac silhouette is moderately enlarged. There is marked severity calcification of the aortic arch. A chronic deformity is seen involving the right humeral head. IMPRESSION: Chronic appearing increased interstitial lung markings without evidence of acute or active cardiopulmonary disease. Electronically Signed   By: Virgina Norfolk M.D.   On: 07/14/2020 00:52   DG Abd Portable 1 View  Result Date: 07/14/2020 CLINICAL DATA:  OG tube placement  EXAM: PORTABLE ABDOMEN - 1 VIEW COMPARISON:  None. FINDINGS: Enteric tube tip is in the upper abdomen consistent with location in the distal stomach. Gas and stool throughout the colon. No small or large bowel distention. Degenerative changes in the spine and hips. Postoperative change in the right hip. IMPRESSION: Enteric tube tip in the upper abdomen consistent with location in the distal stomach. Electronically Signed   By: Lucienne Capers M.D.   On: 07/14/2020 03:48    Cardiac Studies   Bedside echo: Normal RV function and size, EF around 55% with no obvious wall motion normalities, at least mild aortic stenosis present  Patient Profile  [name] is a 79 y.o. female with CAD (drug-eluting stent to mid RCA 2009, drug-eluting stent to mid LAD in 2006), hypertension, hyperlipidemia, PE reportedly on warfarin who was admitted overnight on 07/14/2020 with concerns for inferior STEMI who subsequently developed hypoxic respiratory failure requiring intubation.  Assessment & Plan   1.  Inferior ST elevation MI/acute hypoxic respiratory failure/acute heart failure -Transfer from [facility] with left arm numbness and ST elevation inferior leads.  When she arrives to [facility] she had no further chest pain.  She developed acute hypoxic  respiratory failure likely secondary to flash pulmonary edema.  ST segment elevation resolved.  I did perform a bedside echo which shows normal EF without any obvious regional wall motion abnormality around 60%.  RV function is normal. -Highly concerning for severe three-vessel disease with critical left main stenosis to explain her flash pulmonary edema. -She is on minimal O2 requirements at this time.  ABG with metabolic lactic acidosis 7.2 4/64/265.  Lactic acid 3.5. -BNP 397.  Troponin II 64 315 on repeat.  Continue to trend troponin. -Serum creatinine 1.47.  Baseline serum creatinine around 1.2.  Think this is okay. -We will continue aspirin and heparin drip for now. -No beta-blocker at this time given pulmonary edema.  She did receive Lasix 40 mg IV x1. -I did discuss her case with interventional cardiologist Dr. Gerrit Halls.  Given that she is on minimal vent setting and there is concern for critical three-vessel disease or possible left main disease we have decided to proceed with left heart catheterization later this morning.  I did update family of this. -I have also informed her they would not like chest compressions.  They would like to continue with mechanical ventilation for now.  Did not want prolonged life support measures. -Critical care medicine was consulted to assume her care.  Given that she may require extended mechanical ventilation we do appreciate their input as well as their assistance with her care.    CRITICAL CARE Performed by: [place] B[place] T O'[place]al  Total critical care time: 45 minutes. Critical care time was exclusive of separately billable procedures and treating other patients. Critical care was necessary to treat or prevent imminent or life-threatening deterioration. Critical care was time spent personally by me on the following activities: development of treatment plan with patient and/or surrogate as well as nursing, discussions with consultants, evaluation of patient's  response to treatment, examination of patient, obtaining history from patient or surrogate, ordering and performing treatments and interventions, ordering and review of laboratory studies, ordering and review of radiographic studies, pulse oximetry and re-evaluation of patient's condition.  Additionally, this included a bedside point-of-care echocardiogram that was performed personally by me.  For questions or updates, please contact [place] Please consult [email or web address] for contact info under   Signed, Lake Bells T. Audie Box, MD [facility]  07/14/2020 8:24 AM

## 2020-07-14 NOTE — Progress Notes (Signed)
Pt's daughter, [name], notified of pt being extubated. Will continue to monitor.

## 2020-07-14 NOTE — Progress Notes (Signed)
Pharmacy Antibiotic Note  [name] is a 79 y.o. female admitted on 07/14/2020 with asp pna.  Pharmacy has been consulted for Unasyn dosing.  Plan: Unasyn 3gm IV q8h Will f/u renal function, micro data, and pt's clinical condition  Height: 5' 5.5" (166.4 cm) Weight: 83.4 kg (183 lb 13.8 oz) IBW/kg (Calculated) : 58.15  Temp (24hrs), Avg:98.3 F (36.8 C), Min:98.3 F (36.8 C), Max:98.3 F (36.8 C)  Recent Labs  Lab 07/14/20 0038 07/14/20 0041  WBC 6.7  --   CREATININE  --  1.47*    Estimated Creatinine Clearance: 32.9 mL/min (A) (by C-G formula based on SCr of 1.47 mg/dL (H)).    No Known Allergies  Antimicrobials this admission: 10/1 Unasyn >>   Microbiology results: Pending  Thank you for allowing pharmacy to be a part of this patient's care.  Sherlon Handing, PharmD, BCPS Please see amion for complete clinical pharmacist phone list 07/14/2020 4:07 AM

## 2020-07-14 NOTE — Interval H&P Note (Signed)
Cath Lab Visit (complete for each Cath Lab visit)  Clinical Evaluation Leading to the Procedure:   ACS: Yes.    Non-ACS:    Anginal Classification: CCS IV  Anti-ischemic medical therapy: No Therapy  Non-Invasive Test Results: No non-invasive testing performed  Prior CABG: No previous CABG      History and Physical Interval Note:  07/14/2020 12:16 PM  [name]  has presented today for surgery, with the diagnosis of nonstemi.  The various methods of treatment have been discussed with the patient and family. After consideration of risks, benefits and other options for treatment, the patient has consented to  Procedure(s): LEFT HEART CATH AND CORONARY ANGIOGRAPHY (N/A) as a surgical intervention.  The patient's history has been reviewed, patient examined, no change in status, stable for surgery.  I have reviewed the patient's chart and labs.  Questions were answered to the patient's satisfaction.     Sherren Mocha

## 2020-07-14 NOTE — Progress Notes (Addendum)
Ms. Lax did well with PCI to the RCA.  Warfarin will be restarted tonight.  She was extubated by critical care medicine.  She is on BiPAP at the time of my reevaluation.  LVEDP normal on cath.  Suspect this is all flash pulmonary edema in the setting coronary ischemia 2/2 occluded RCA. I think she will be appropriate to be transferred out to the floor tomorrow under the cardiology service.  Cardiology team will round on her tomorrow.  Lake Bells T. Audie Box, [place]  [address], [place], [place] [address] [phone]  5:32 PM

## 2020-07-14 NOTE — Progress Notes (Signed)
  Echocardiogram 2D Echocardiogram has been performed.  [name] 07/14/2020, 12:02 PM

## 2020-07-14 NOTE — ED Notes (Signed)
Dr Sonia Side notified of pts continued sob and high bp, verbal order to titrate nitro to 71mcg at this time. Will continue to monitor pt.

## 2020-07-14 NOTE — ED Provider Notes (Signed)
[facility] EMERGENCY DEPARTMENT Provider Note   CSN: [id number] Arrival date & time: 07/14/20  0011     History Chief Complaint  Patient presents with  . Chest Pain    [name] is a 79 y.o. female.  79 year old female with both medical problems documented below most pertinent for having had a MI back in 2000 with some stents placed of unknown location.  Just prior to arrival she had acute onset of left-sided chest pressure and left finger paresthesias similar to previous heart attack.  She had a little bit of nausea no dyspnea.  No diaphoresis.  No syncope or lightheadedness.  Came here immediately via POV did not have any aspirin or other medications.   Chest Pain      Past Medical History:  Diagnosis Date  . Arthritis   . Atrial flutter ([place])    typical appearing  . Bilateral pulmonary embolism ([place]) 08/2008   On coumadin  . Coronary artery disease    s/p DES to mid RCA in 02/2008; s/p inferior MI with DES to mid RCA and mid LAD in 05/2005  . Coronary atherosclerosis   . Diabetes mellitus without complication ([place])   . Hyperlipidemia   . Hypertension    Controlled  . Ischemic cardiomyopathy   . Memory disorder 07/08/2017  . Myocardial infarction ([place])   . Stroke ([place])   . Unspecified combined systolic and diastolic heart failure    Asymptomatic. Echo 04/28/13 EF= 40- 45%  . Unspecified combined systolic and diastolic heart failure     Patient Active Problem List   Diagnosis Date Noted  . S/P left knee arthroscopy 09/25/17 10/02/2017  . Derangement of posterior horn of medial meniscus of left knee   . Derangement of posterior horn of lateral meniscus of left knee   . Primary osteoarthritis of left knee   . Memory disorder 07/08/2017  . Carotid artery stenosis 04/08/2017  . Left arm numbness 03/25/2017  . Chest pain 03/25/2017  . DM type 2 (diabetes mellitus, type 2) ([place]) 03/25/2017  . CKD (chronic kidney disease), stage III ([place]) 03/25/2017  . Long  term (current) use of anticoagulants [Z79.01] 04/22/2016  . Facial droop   . Encounter for therapeutic drug monitoring 12/01/2013  . Atrial flutter ([place]) 06/09/2013  . Essential hypertension 06/09/2013  . CAD (coronary artery disease) 06/09/2013  . CLOSED FRACTURE OF SURGICAL NECK OF HUMERUS 04/12/2010  . HIP, ARTHRITIS, DEGEN./OSTEO 03/01/2009  . HIP PAIN 03/01/2009    Past Surgical History:  Procedure Laterality Date  . ATRIAL FLUTTER ABLATION N/A 06/17/2013   Procedure: ATRIAL FLUTTER ABLATION;  Surgeon: Thompson Grayer, MD;  Location: [facility] CATH LAB;  Service: Cardiovascular;  Laterality: N/A;  . CAROTID PTA/STENT INTERVENTION N/A 04/10/2017   Procedure: Carotid PTA/Stent Intervention;  Surgeon: Algernon Huxley, MD;  Location: [facility] CV LAB;  Service: Cardiovascular;  Laterality: N/A;  . CHOLECYSTECTOMY    . HIP SURGERY    . KNEE ARTHROSCOPY WITH MEDIAL MENISECTOMY Left 09/25/2017   Procedure: KNEE ARTHROSCOPY WITH MEDIAL AND LATERAL MENISECTOMY;  Surgeon: [name], MD;  Location: [facility] ORS;  Service: Orthopedics;  Laterality: Left;     OB History   No obstetric history on file.     Family History  Problem Relation Age of Onset  . Heart disease Other   . Arthritis Other   . Cancer Other   . Diabetes Other   . Heart failure Mother     Social History   Tobacco  Use  . Smoking status: Former Smoker    Years: 10.00    Types: Cigarettes    Quit date: 10/14/1978    Years since quitting: 41.7  . Smokeless tobacco: Never Used  Vaping Use  . Vaping Use: Never used  Substance Use Topics  . Alcohol use: No  . Drug use: No    Home Medications Prior to Admission medications   Medication Sig Start Date End Date Taking? Authorizing Provider  amLODipine (NORVASC) 5 MG tablet Take 5 mg by mouth daily.    [provider]  Ascorbic Acid (VITAMIN C GUMMIES PO) Take 2 each by mouth daily. 180 mg    [provider]  aspirin EC 81 MG tablet Take 81 mg by mouth  daily.    [provider]  atorvastatin (LIPITOR) 10 MG tablet Take 10 mg by mouth daily.    [provider]  Coenzyme Q10 (CO Q 10) 100 MG CAPS Take 100 mg by mouth daily.    [provider]  donepezil (ARICEPT) 5 MG tablet Take 1 tablet (5 mg total) by mouth at bedtime. 07/08/17   Kathrynn Ducking, MD  enalapril (VASOTEC) 10 MG tablet Take 1 tablet (10 mg total) by mouth 2 (two) times daily. 05/27/14   Belva Crome, MD  hydrochlorothiazide (HYDRODIURIL) 25 MG tablet Take 25 mg by mouth daily.    [provider]  MEGARED OMEGA-3 KRILL OIL PO Take 1,000 mg by mouth daily. When she remembers    [provider]  metFORMIN (GLUCOPHAGE) 500 MG tablet Take 1 tablet (500 mg total) by mouth 2 (two) times daily with a meal. Patient taking differently: Take 1,000 mg by mouth 2 (two) times daily with a meal.  10/12/14   Sinda Du, MD  metoprolol (LOPRESSOR) 50 MG tablet Take 50 mg by mouth 2 (two) times daily.    [provider]  nitroGLYCERIN (NITROSTAT) 0.4 MG SL tablet Place 0.4 mg under the tongue every 5 (five) minutes x 3 doses as needed for chest pain.     [provider]  warfarin (COUMADIN) 5 MG tablet Take 5-7.5 mg by mouth daily at 6 PM. Takes 7.5 mg every day except on Wednesday and Saturdays takes 5 mg    [provider]    Allergies    Patient has no known allergies.  Review of Systems   Review of Systems  Cardiovascular: Positive for chest pain.  All other systems reviewed and are negative.   Physical Exam Updated Vital Signs BP (!) 166/94 (BP Location: Right Arm)   Pulse 66   Temp 98.3 F (36.8 C) (Oral)   Resp 18   Ht 5' 5.5" (1.664 m)   Wt 72.6 kg   SpO2 99%   BMI 26.22 kg/m   Physical Exam Vitals and nursing note reviewed.  Constitutional:      Appearance: She is well-developed.     Comments: Clutching her chest  HENT:     Head: Normocephalic and atraumatic.  Cardiovascular:     Rate  and Rhythm: Normal rate and regular rhythm.  Pulmonary:     Effort: No respiratory distress.     Breath sounds: No stridor.  Abdominal:     General: There is no distension.  Musculoskeletal:        General: Normal range of motion.     Cervical back: Normal range of motion.     Right lower leg: Edema ( Trace pitting mid tibia bilaterally) present.  Left lower leg: Edema present.  Skin:    General: Skin is warm and dry.  Neurological:     General: No focal deficit present.     Mental Status: She is alert.     ED Results / Procedures / Treatments   Labs (all labs ordered are listed, but only abnormal results are displayed) Labs Reviewed  RESPIRATORY PANEL BY RT PCR (FLU A&B, COVID)  BASIC METABOLIC PANEL  HEMOGLOBIN A1C  CBC WITH DIFFERENTIAL/PLATELET  PROTIME-INR  APTT  LIPID PANEL  TROPONIN I (HIGH SENSITIVITY)    EKG EKG Interpretation  Date/Time:  Friday July 14 2020 00:32:32 EDT Ventricular Rate:  87 PR Interval:  170 QRS Duration: 100 QT Interval:  384 QTC Calculation: 462 R Axis:   52 Text Interpretation: Sinus rhythm with occasional Premature ventricular complexes Low voltage QRS Cannot rule out Anterior infarct , age undetermined ST & T wave abnormality, consider lateral ischemia Abnormal ECG STEMI activated Confirmed by Merrily Pew [phone]) on 07/14/2020 12:39:57 AM   Radiology No results found.  Procedures .Critical Care Performed by: Merrily Pew, MD Authorized by: Merrily Pew, MD   Critical care provider statement:    Critical care time (minutes):  45   Critical care was necessary to treat or prevent imminent or life-threatening deterioration of the following conditions:  Cardiac failure   Critical care was time spent personally by me on the following activities:  Discussions with consultants, evaluation of patient's response to treatment, examination of patient, ordering and performing treatments and interventions, ordering and review of  laboratory studies, ordering and review of radiographic studies, pulse oximetry, re-evaluation of patient's condition, obtaining history from patient or surrogate and review of old charts   (including critical care time)  Medications Ordered in ED Medications  0.9 %  sodium chloride infusion (has no administration in time range)  aspirin chewable tablet 324 mg (has no administration in time range)  heparin injection 4,000 Units (has no administration in time range)    ED Course  I have reviewed the triage vital signs and the nursing notes.  Pertinent labs & imaging results that were available during my care of the patient were reviewed by me and considered in my medical decision making (see chart for details).    MDM Rules/Calculators/A&P                          Patient's EKG is consistent with ST elevation myocardial infarction in the inferior leads with lateral depressions as reciprocal changes.  Repeat was done that showed similar findings but more prominent however never was exported into the system.  Heparin given, aspirin given.  Discussed with Dr. Pernell Dupre immediately after the STEMI was activated.  He agreed with findings and accepted in transfer for catheterization.  [facility] called immediately patient transported in stable condition.  Final Clinical Impression(s) / ED Diagnoses Final diagnoses:  Acute coronary syndrome ([place])  Acute pulmonary edema (HCC)  Renal insufficiency  Normochromic normocytic anemia    Rx / DC Orders ED Discharge Orders    None       Ivey Nembhard, Corene Cornea, MD 07/14/20 [phone]

## 2020-07-14 NOTE — ED Notes (Signed)
CODE STEMI PAGED 

## 2020-07-14 NOTE — Plan of Care (Signed)
Doing well post extubation on BiPAP.  Sleeping comfortably.  Okay to discontinue BiPAP when awake and alert.  She will need bedside swallow prior to eating or drinking.  Had afternoon dose of Plavix since returning from the Cath Lab.   Julian Hy, DO 07/14/20 5:45 PM [facility]

## 2020-07-14 NOTE — ED Notes (Signed)
Dr. Sonia Side notified of latest troponin value

## 2020-07-14 NOTE — ED Notes (Signed)
EMS @ bedside, pt NAD at this time

## 2020-07-14 NOTE — Progress Notes (Addendum)
ANTICOAGULATION CONSULT NOTE - Initial Consult  Pharmacy Consult for heparin Indication: chest pain/ACS and Aflutter  No Known Allergies  Patient Measurements: Height: 5' 5.5" (166.4 cm) Weight: 83.4 kg (183 lb 13.8 oz) IBW/kg (Calculated) : 58.15 Heparin Dosing Weight: 70kg  Vital Signs: Temp: 98.3 F (36.8 C) (10/01 0026) Temp Source: Oral (10/01 0026) BP: 153/72 (10/01 0105) Pulse Rate: 76 (10/01 0105)  Labs: Recent Labs    07/14/20 0038 07/14/20 0041  HGB 10.0*  --   HCT 32.9*  --   PLT 338  --   APTT 24  --   LABPROT 12.8  --   INR 1.0  --   CREATININE  --  1.47*  TROPONINIHS  --  264*    Estimated Creatinine Clearance: 32.9 mL/min (A) (by C-G formula based on SCr of 1.47 mg/dL (H)).   Medical History: Past Medical History:  Diagnosis Date  . Arthritis   . Atrial flutter ([place])    typical appearing  . Bilateral pulmonary embolism ([place]) 08/2008   On coumadin  . Coronary artery disease    s/p DES to mid RCA in 02/2008; s/p inferior MI with DES to mid RCA and mid LAD in 05/2005  . Coronary atherosclerosis   . Diabetes mellitus without complication ([place])   . Hyperlipidemia   . Hypertension    Controlled  . Ischemic cardiomyopathy   . Memory disorder 07/08/2017  . Myocardial infarction ([place])   . Stroke ([place])   . Unspecified combined systolic and diastolic heart failure    Asymptomatic. Echo 04/28/13 EF= 40- 45%  . Unspecified combined systolic and diastolic heart failure     Assessment: 79yo female transferred from [facility] as code STEMI, on arrival to [facility] elevations no longer present and STEMI canceled, awaiting cath when respiratory status/acute pulmonary edema stabilized, to begin heparin.  Of note pt is on Coumadin PTA for Aflutter (DOAC declined by pt) but INR below goal at 1.  Goal of Therapy:  Heparin level 0.3-0.7 units/ml Monitor platelets by anticoagulation protocol: Yes   Plan:  Heparin 4000 units IV given at [facility]; begin heparin gtt at 900  units/hr and monitor heparin levels and CBC.  Wynona Neat, PharmD, BCPS  07/14/2020,2:26 AM

## 2020-07-14 NOTE — ED Notes (Signed)
[facility] called for Emergency traffic to Cath Lab

## 2020-07-14 NOTE — Consult Note (Addendum)
Interventional Consult   The patient was seen in the emergency room along with the night covering cardiology fellow, Alric Quan, MD.  Initial EKGs from [facility] demonstrated borderline inferior ST elevation with reciprocal changes in 1 and aVL.  I agreed to accept the patient as a STEMI.  Upon arrival the patient had no chest discomfort but suddenly developed gurgling, shortness of breath, and pulmonary edema on clinical exam..  Repeat EKG revealed less evidence of acute injury involving the inferior leads. The patient did have a complaint of arm tingling and numbness that is likely her ischemic symptom.  This had resolved upon arrival  The patient has background history of diabetes, stroke, dementia, hyperlipidemia, prior coronary artery disease with inferior infarction.  Right coronary stented x2 most recently in 2009 and LAD stented in 2006.  After development of respiratory distress, the patient was intubated, IV Lasix given, IV nitroglycerin, and IV heparin were started.  The decision was made to treat the patient medically.  STEMI activation was canceled.  She is critically ill and has poor prognosis.  Discussed with the patient's daughter.  Critical care time 30 minutes.

## 2020-07-14 NOTE — Progress Notes (Signed)
Cardiology Progress Note  Patient ID: [name] MRN: [id number] DOB: 1941/01/29 Date of Encounter: 07/14/2020  Primary Cardiologist: Dorris Carnes, MD  Subjective   Chief Complaint: Intubated on vent.  Is.  Follow commands.  HPI: Intubated overnight for flash pulmonary edema.  On minimal O2 requirements.  Following commands.  ST elevation has resolved.  ROS:  All other ROS reviewed and negative. Pertinent positives noted in the HPI.     Inpatient Medications  Scheduled Meds: . aspirin EC  81 mg Oral Daily  . atorvastatin  20 mg Oral Daily  . docusate  100 mg Oral BID  . insulin aspart  0-15 Units Subcutaneous TID WC  . insulin aspart  0-5 Units Subcutaneous QHS  . pantoprazole (PROTONIX) IV  40 mg Intravenous Daily  . polyethylene glycol  17 g Oral Daily  . sodium chloride flush  3 mL Intravenous Q12H   Continuous Infusions: . sodium chloride    . sodium chloride    . ampicillin-sulbactam (UNASYN) IV Stopped (07/14/20 0726)  . fentaNYL infusion INTRAVENOUS 25 mcg/hr (07/14/20 0411)  . heparin 900 Units/hr (07/14/20 0249)  . nitroGLYCERIN Stopped (07/14/20 0522)  . propofol (DIPRIVAN) infusion 20 mcg/kg/min (07/14/20 0801)   PRN Meds: sodium chloride, acetaminophen, fentaNYL, midazolam, midazolam, ondansetron (ZOFRAN) IV, sodium chloride flush   Vital Signs   Vitals:   07/14/20 0725 07/14/20 0730 07/14/20 0750 07/14/20 0815  BP: (!) 123/56 (!) 115/55 135/77 (!) 95/52  Pulse: 71 67 75 71  Resp: (!) 21 (!) 21 (!) 26 18  Temp:      TempSrc:      SpO2: 100% 100% 100% 94%  Weight:      Height:        Intake/Output Summary (Last 24 hours) at 07/14/2020 0824 Last data filed at 07/14/2020 0726 Gross per 24 hour  Intake 652.86 ml  Output --  Net 652.86 ml   Last 3 Weights 07/14/2020 07/14/2020 07/15/2018  Weight (lbs) 183 lb 13.8 oz 160 lb 179 lb  Weight (kg) 83.4 kg 72.576 kg 81.194 kg      Telemetry  Overnight telemetry shows sinus rhythm with heart rate in  70s, which I personally reviewed.   ECG  The most recent ECG shows twelve-lead tracing obtained from telemetry demonstrates normal sinus rhythm, heart rate 73, nonspecific IVCD, inferior lateral T wave inversions noted, which I personally reviewed.   Physical Exam   Vitals:   07/14/20 0725 07/14/20 0730 07/14/20 0750 07/14/20 0815  BP: (!) 123/56 (!) 115/55 135/77 (!) 95/52  Pulse: 71 67 75 71  Resp: (!) 21 (!) 21 (!) 26 18  Temp:      TempSrc:      SpO2: 100% 100% 100% 94%  Weight:      Height:         Intake/Output Summary (Last 24 hours) at 07/14/2020 0824 Last data filed at 07/14/2020 0726 Gross per 24 hour  Intake 652.86 ml  Output --  Net 652.86 ml    Last 3 Weights 07/14/2020 07/14/2020 07/15/2018  Weight (lbs) 183 lb 13.8 oz 160 lb 179 lb  Weight (kg) 83.4 kg 72.576 kg 81.194 kg    Body mass index is 30.13 kg/m.  General: Intubated on vent Head: Atraumatic, normal size  Eyes: PEERLA, EOMI  Neck: Supple, no JVD Endocrine: No thryomegaly Cardiac: Normal S1, S2; RRR; no murmurs, rubs, or gallops Lungs: Diffuse rhonchi bilaterally Abd: Soft, nontender, no hepatomegaly  Ext: Trace edema Musculoskeletal: No deformities, BUE  and BLE strength normal and equal Skin: Warm and dry, no rashes   Neuro: Alert and oriented to person, place, time, and situation, CNII-XII grossly intact, no focal deficits  Psych: Normal mood and affect   Labs  High Sensitivity Troponin:   Recent Labs  Lab 07/14/20 0041 07/14/20 0212  TROPONINIHS 264* 315*     Cardiac EnzymesNo results for input(s): TROPONINI in the last 168 hours. No results for input(s): TROPIPOC in the last 168 hours.  Chemistry Recent Labs  Lab 07/14/20 0041 07/14/20 0428  NA 140 140  K 4.1 4.4  CL 105  --   CO2 25  --   GLUCOSE 203*  --   BUN 34*  --   CREATININE 1.47*  --   CALCIUM 9.2  --   GFRNONAA 33*  --   GFRAA 39*  --   ANIONGAP 10  --     Hematology Recent Labs  Lab 07/14/20 0038 07/14/20 0428   WBC 6.7  --   RBC 3.64*  --   HGB 10.0* 11.9*  HCT 32.9* 35.0*  MCV 90.4  --   MCH 27.5  --   MCHC 30.4  --   RDW 14.5  --   PLT 338  --    BNP Recent Labs  Lab 07/14/20 0212  BNP 397.2*    DDimer No results for input(s): DDIMER in the last 168 hours.   Radiology  DG Chest Portable 1 View  Result Date: 07/14/2020 CLINICAL DATA:  OG and endotracheal tube placements EXAM: PORTABLE CHEST 1 VIEW COMPARISON:  07/14/2020 FINDINGS: Endotracheal tube placed with tip measuring 4.8 cm above the carina. Enteric tube tip is off the field of view but below the left hemidiaphragm. Cardiac enlargement. Perihilar infiltration or edema. Right upper lung consolidation is improving since prior study. No pleural effusions. No pneumothorax. Calcified and tortuous aorta. IMPRESSION: Appliances appear in satisfactory position. Cardiac enlargement with perihilar infiltration or edema. Improving consolidation in the right upper lung. Electronically Signed   By: Lucienne Capers M.D.   On: 07/14/2020 03:47   DG Chest Port 1 View  Result Date: 07/14/2020 CLINICAL DATA:  Shortness of breath. Chest pain and numbness to the left fingers. Swelling in the left foot and ankle EXAM: PORTABLE CHEST 1 VIEW COMPARISON:  07/14/2020 FINDINGS: Shallow inspiration. Cardiac enlargement. Infiltration and consolidation in the right upper lung likely representing pneumonia. Perihilar infiltrates and interstitial changes may represent edema or multifocal pneumonia. No pleural effusions. No pneumothorax. Calcification of the aorta. Degenerative changes in the spine and shoulders. IMPRESSION: Cardiac enlargement with bilateral perihilar infiltrates and consolidation in the right upper lung. There is progression since the previous study from earlier today. Electronically Signed   By: Lucienne Capers M.D.   On: 07/14/2020 02:25   DG Chest Portable 1 View  Result Date: 07/14/2020 CLINICAL DATA:  Chest pain. EXAM: PORTABLE CHEST 1 VIEW  COMPARISON:  October 11, 2014 FINDINGS: Mild to moderate severity diffuse chronic appearing increased interstitial lung markings are noted. There is no evidence of focal consolidation, pleural effusion or pneumothorax. The cardiac silhouette is moderately enlarged. There is marked severity calcification of the aortic arch. A chronic deformity is seen involving the right humeral head. IMPRESSION: Chronic appearing increased interstitial lung markings without evidence of acute or active cardiopulmonary disease. Electronically Signed   By: Virgina Norfolk M.D.   On: 07/14/2020 00:52   DG Abd Portable 1 View  Result Date: 07/14/2020 CLINICAL DATA:  OG tube placement  EXAM: PORTABLE ABDOMEN - 1 VIEW COMPARISON:  None. FINDINGS: Enteric tube tip is in the upper abdomen consistent with location in the distal stomach. Gas and stool throughout the colon. No small or large bowel distention. Degenerative changes in the spine and hips. Postoperative change in the right hip. IMPRESSION: Enteric tube tip in the upper abdomen consistent with location in the distal stomach. Electronically Signed   By: Lucienne Capers M.D.   On: 07/14/2020 03:48    Cardiac Studies   Bedside echo: Normal RV function and size, EF around 55% with no obvious wall motion normalities, at least mild aortic stenosis present  Patient Profile  [name] is a 79 y.o. female with CAD (drug-eluting stent to mid RCA 2009, drug-eluting stent to mid LAD in 2006), hypertension, hyperlipidemia, PE reportedly on warfarin who was admitted overnight on 07/14/2020 with concerns for inferior STEMI who subsequently developed hypoxic respiratory failure requiring intubation.  Assessment & Plan   1.  Inferior ST elevation MI/acute hypoxic respiratory failure/acute heart failure -Transfer from [facility] with left arm numbness and ST elevation inferior leads.  When she arrives to [facility] she had no further chest pain.  She developed acute hypoxic  respiratory failure likely secondary to flash pulmonary edema.  ST segment elevation resolved.  I did perform a bedside echo which shows normal EF without any obvious regional wall motion abnormality around 60%.  RV function is normal. -Highly concerning for severe three-vessel disease with critical left main stenosis to explain her flash pulmonary edema. -She is on minimal O2 requirements at this time.  ABG with metabolic lactic acidosis 7.2 4/64/265.  Lactic acid 3.5. -BNP 397.  Troponin II 64 315 on repeat.  Continue to trend troponin. -Serum creatinine 1.47.  Baseline serum creatinine around 1.2.  Think this is okay. -We will continue aspirin and heparin drip for now. -No beta-blocker at this time given pulmonary edema.  She did receive Lasix 40 mg IV x1. -I did discuss her case with interventional cardiologist Dr. Gerrit Halls.  Given that she is on minimal vent setting and there is concern for critical three-vessel disease or possible left main disease we have decided to proceed with left heart catheterization later this morning.  I did update family of this. -I have also informed her they would not like chest compressions.  They would like to continue with mechanical ventilation for now.  Did not want prolonged life support measures. -Critical care medicine was consulted to assume her care.  Given that she may require extended mechanical ventilation we do appreciate their input as well as their assistance with her care.    CRITICAL CARE Performed by: [place] Bells T O'Neal  Total critical care time: 45 minutes. Critical care time was exclusive of separately billable procedures and treating other patients. Critical care was necessary to treat or prevent imminent or life-threatening deterioration. Critical care was time spent personally by me on the following activities: development of treatment plan with patient and/or surrogate as well as nursing, discussions with consultants, evaluation of patient's  response to treatment, examination of patient, obtaining history from patient or surrogate, ordering and performing treatments and interventions, ordering and review of laboratory studies, ordering and review of radiographic studies, pulse oximetry and re-evaluation of patient's condition.  Additionally, this included a bedside point-of-care echocardiogram that was performed personally by me.  For questions or updates, please contact [place] Please consult [email or web address] for contact info under   Signed, Lake Bells T. Audie Box, MD [facility]  07/14/2020 8:24 AM

## 2020-07-14 NOTE — ED Notes (Signed)
Dr Sonia Side at bedsided, pt vomiting, and hypotensive. Pt and daughter at bedside requesting to be intubated.

## 2020-07-14 NOTE — Progress Notes (Signed)
130ml of fentanyl gtt wasted in steri cycle. Witnessed by Rosario Jacks RN and [facility] RN.

## 2020-07-15 ENCOUNTER — Inpatient Hospital Stay (HOSPITAL_COMMUNITY): Payer: Medicare Other

## 2020-07-15 DIAGNOSIS — D649 Anemia, unspecified: Secondary | ICD-10-CM

## 2020-07-15 DIAGNOSIS — J810 Acute pulmonary edema: Secondary | ICD-10-CM

## 2020-07-15 DIAGNOSIS — N1832 Chronic kidney disease, stage 3b: Secondary | ICD-10-CM

## 2020-07-15 DIAGNOSIS — I2111 ST elevation (STEMI) myocardial infarction involving right coronary artery: Secondary | ICD-10-CM

## 2020-07-15 DIAGNOSIS — J9601 Acute respiratory failure with hypoxia: Secondary | ICD-10-CM

## 2020-07-15 DIAGNOSIS — I5041 Acute combined systolic (congestive) and diastolic (congestive) heart failure: Secondary | ICD-10-CM

## 2020-07-15 LAB — GLUCOSE, CAPILLARY
Glucose-Capillary: 121 mg/dL — ABNORMAL HIGH (ref 70–99)
Glucose-Capillary: 135 mg/dL — ABNORMAL HIGH (ref 70–99)
Glucose-Capillary: 180 mg/dL — ABNORMAL HIGH (ref 70–99)
Glucose-Capillary: 181 mg/dL — ABNORMAL HIGH (ref 70–99)
Glucose-Capillary: 248 mg/dL — ABNORMAL HIGH (ref 70–99)

## 2020-07-15 LAB — CBC
HCT: 29.5 % — ABNORMAL LOW (ref 36.0–46.0)
Hemoglobin: 9 g/dL — ABNORMAL LOW (ref 12.0–15.0)
MCH: 27.3 pg (ref 26.0–34.0)
MCHC: 30.5 g/dL (ref 30.0–36.0)
MCV: 89.4 fL (ref 80.0–100.0)
Platelets: 275 10*3/uL (ref 150–400)
RBC: 3.3 MIL/uL — ABNORMAL LOW (ref 3.87–5.11)
RDW: 14.6 % (ref 11.5–15.5)
WBC: 9.8 10*3/uL (ref 4.0–10.5)
nRBC: 0 % (ref 0.0–0.2)

## 2020-07-15 LAB — HEPARIN LEVEL (UNFRACTIONATED): Heparin Unfractionated: <0.1 IU/mL — ABNORMAL LOW (ref 0.30–0.70)

## 2020-07-15 LAB — BASIC METABOLIC PANEL
Anion gap: 10 (ref 5–15)
BUN: 30 mg/dL — ABNORMAL HIGH (ref 8–23)
CO2: 24 mmol/L (ref 22–32)
Calcium: 8.5 mg/dL — ABNORMAL LOW (ref 8.9–10.3)
Chloride: 109 mmol/L (ref 98–111)
Creatinine, Ser: 1.6 mg/dL — ABNORMAL HIGH (ref 0.44–1.00)
GFR calc Af Amer: 35 mL/min — ABNORMAL LOW (ref >60)
GFR calc non Af Amer: 30 mL/min — ABNORMAL LOW (ref >60)
Glucose, Bld: 141 mg/dL — ABNORMAL HIGH (ref 70–99)
Potassium: 3.9 mmol/L (ref 3.5–5.1)
Sodium: 143 mmol/L (ref 135–145)

## 2020-07-15 LAB — PROTIME-INR
INR: 1.1 (ref 0.8–1.2)
Prothrombin Time: 13.6 seconds (ref 11.4–15.2)

## 2020-07-15 MED ORDER — DOCUSATE SODIUM 100 MG PO CAPS
100.0000 mg | ORAL_CAPSULE | Freq: Two times a day (BID) | ORAL | Status: DC
  Administered 2020-07-15 – 2020-07-22 (×14): 100 mg via ORAL
  Filled 2020-07-15 (×15): qty 1

## 2020-07-15 MED ORDER — ENOXAPARIN SODIUM 40 MG/0.4ML ~~LOC~~ SOLN: 40.0000 mg | SUBCUTANEOUS | Status: DC

## 2020-07-15 MED ORDER — ASPIRIN 81 MG PO CHEW
81.0000 mg | CHEWABLE_TABLET | Freq: Every day | ORAL | Status: DC
  Administered 2020-07-15 – 2020-07-22 (×8): 81 mg via ORAL
  Filled 2020-07-15 (×8): qty 1

## 2020-07-15 MED ORDER — WARFARIN SODIUM 7.5 MG PO TABS
7.5000 mg | ORAL_TABLET | Freq: Once | ORAL | Status: AC
  Administered 2020-07-15: 7.5 mg via ORAL
  Filled 2020-07-15: qty 1

## 2020-07-15 MED ORDER — ENOXAPARIN SODIUM 30 MG/0.3ML ~~LOC~~ SOLN
30.0000 mg | SUBCUTANEOUS | Status: DC
  Administered 2020-07-15 – 2020-07-18 (×4): 30 mg via SUBCUTANEOUS
  Filled 2020-07-15 (×4): qty 0.3

## 2020-07-15 MED ORDER — CLOPIDOGREL BISULFATE 75 MG PO TABS
75.0000 mg | ORAL_TABLET | Freq: Every day | ORAL | Status: DC
  Administered 2020-07-15 – 2020-07-22 (×8): 75 mg via ORAL
  Filled 2020-07-15 (×8): qty 1

## 2020-07-15 MED ORDER — WARFARIN SODIUM 5 MG PO TABS: 5.0000 mg | ORAL_TABLET | Freq: Once | ORAL | Status: DC

## 2020-07-15 MED ORDER — CARVEDILOL 3.125 MG PO TABS
3.1250 mg | ORAL_TABLET | Freq: Two times a day (BID) | ORAL | Status: DC
  Administered 2020-07-15 – 2020-07-16 (×3): 3.125 mg via ORAL
  Filled 2020-07-15 (×3): qty 1

## 2020-07-15 NOTE — Progress Notes (Signed)
NAME:  [name], MRN:  [id number], DOB:  [birth year], LOS: 1 ADMISSION DATE:  07/14/2020, CONSULTATION DATE:  07/14/20 REFERRING MD:  Sonia Side  CHIEF COMPLAINT:  SOB   Brief History   [name] is a 79 y.o. female who was transferred from [facility] and admitted to [facility] 10/1 with subtle ST changes and SOB.  She had acute pulmonary edema and vomiting in ED prompting intubation.  PCCM asked to assist with vent management.  History of present illness   Pt is encephelopathic; therefore, this HPI is obtained from chart review. [name] is a 79 y.o. female who has a PMH including but not limited to CAD, HTN, A.flutter, DM (see "past medical history" for rest).  She presented to [facility] ED 10/1 with left arm numbness and mild chest discomfort.  She had nonspecific ST changes and was subsequently transferred to [facility] for further evaluation by cardiology.  During transport, she had worsening dyspnea and hypoxia to 70's.  She was placed on NRB with improvement.  Unfortunately after arrival to [facility], she continued to have hypoxia and also had 1 episode of vomiting.  Decision was made to intubate her.  Cath lab has been postponed due to no active chest pain.  She is currently on heparin and nitro infusions.    Daughter is at the bedside.  She states pt would be ok with short term life support intubation / advanced therapies; however, would not want prolonged life sustaining measures.  Past Medical History  has HIP, ARTHRITIS, DEGEN./OSTEO; HIP PAIN; CLOSED FRACTURE OF SURGICAL NECK OF HUMERUS; Atrial flutter ([place]); Essential hypertension; CAD (coronary artery disease); Encounter for therapeutic drug monitoring; Facial droop; Long term (current) use of anticoagulants [Z79.01]; Left arm numbness; Chest pain; DM type 2 (diabetes mellitus, type 2) ([place]); CKD (chronic kidney disease), stage III ([place]); Carotid artery stenosis; Memory disorder; Derangement of posterior horn of medial meniscus of left knee; Derangement  of posterior horn of lateral meniscus of left knee; Primary osteoarthritis of left knee; S/P left knee arthroscopy 09/25/17; Acute heart failure ([place]); Acute coronary syndrome ([place]); Acute pulmonary edema ([place]); Respiratory failure ([place] ); and Non-ST elevation (NSTEMI) myocardial infarction [facility]) on their problem list.  Significant Hospital Events   10/1 > admit. 10/1 LHC with DES to RCA, extuabted Consults:  PCCM Cardiology  Procedures:  ETT 10/1 > 10/1  Significant Diagnostic Tests:  CXR 10/1 > edema, RML consolidation. 10/1 echo> LV septal hypokinesis, LVEF 45-50%, G2DD, severely dilated LA Micro Data:  COVID 10/1 > neg. Flu 10/1 > neg.  Antimicrobials:  Unasyn 10/1 >10/2  Interim history/subjective:  Episode of confusion overnight, improved this morning. She denies chest pain and other complaints.  Objective:  Blood pressure (!) 103/50, pulse 77, temperature 98.8 F (37.1 C), temperature source Axillary, resp. rate 16, height 5' 5.5" (1.664 m), weight 83.7 kg, SpO2 95 %.    Vent Mode: BIPAP;PCV FiO2 (%):  [35 %-40 %] 40 % Set Rate:  [15 bmp-18 bmp] 15 bmp Vt Set:  [460 mL] 460 mL PEEP:  [5 cmH20] 5 cmH20 Pressure Support:  [5 cmH20] 5 cmH20 Plateau Pressure:  [24 cmH20] 24 cmH20   Intake/Output Summary (Last 24 hours) at 07/15/2020 0709 Last data filed at 07/14/2020 2300 Gross per 24 hour  Intake 1036.24 ml  Output 1600 ml  Net -563.76 ml   Filed Weights   07/14/20 0026 07/14/20 0054 07/15/20 0500  Weight: 72.6 kg 83.4 kg 83.7 kg    Examination: General: Healthy-appearing  elderly woman sitting up in the chair no acute distress Neuro: Easily arousable from sleep, answering questions appropriately.  Normal speech.  Moving all extremities spontaneously. HEENT: [place]/AT, eyes anicteric, oral mucosa moist Cardiovascular: Regular rate and rhythm, no murmurs Lungs: Breathing comfortably on 2 L nasal cannula, CTA B. Abdomen: Soft, nontender, nondistended Musculoskeletal:  Minimal pedal edema Skin: Warm, dry, no significant bruising  Assessment & Plan:   Acute respiratory failure due to acute cardiogenic pulmonary edema- extubated 10/1. - con't supplemental O2 as required to maintain SpO2 >90%; wean as tolerated - pulmonary hygiene- OOB mobility, IS -discontinue antibiotics  NSTEMI, s/p DES to RCA - con't DAPT, high intensity statin  -Adding low-dose blocker - transfer to cardiology service; ok to SD from ICU today - con't tele monitoring  Hx HTN, HLD, A.flutter (on warfarin-currently subtherapeutic). -Continue to monitor blood pressures.  Can restart oral antihypertensives slowly given relative hypotension this morning. -Continue Coumadin.  Will add subcutaneous heparin DVT prophylaxis until therapeutic.  Appreciate pharmacy assistance with dosing Coumadin. -Rate control with metoprolol. -Continue monitoring on telemetry  DM- BG controlled currently -Continue sliding scale insulin.  May need to add long-acting insulin or scheduled mealtime insulin while admitted to the hospital -Continue holding PTA Metformin -Goal BG 140-180 while admitted to the ICU  CKD IIIB; unknown baseline creatinine -Continue to monitor -Renally dose meds, avoid nephrotoxic meds -Strict I/O  Chronic anemia -con't to monitor -transfuse for Hb<7 or hemodynamically significant bleeding -recommend outpatient iron studies   Best Practice:  Diet:  cardiac Pain/Anxiety/Delirium protocol (if indicated): n/a RASS goal -1. VAP protocol (if indicated): n/a DVT prophylaxis: lovenox until therapeutic on coumadin. GI prophylaxis: n/a Glucose control: SSI Mobility: OOB with assistacne Code Status: Full. Family Communication: Daughter will be updated today. Disposition: ICU.  Labs   CBC: Recent Labs  Lab 07/14/20 0038 07/14/20 0428 07/14/20 1301 07/15/20 0100  WBC 6.7  --   --  9.8  NEUTROABS 3.5  --   --   --   HGB 10.0* 11.9* 9.9* 9.0*  HCT 32.9* 35.0* 29.0* 29.5*   MCV 90.4  --   --  89.4  PLT 338  --   --  841   Basic Metabolic Panel: Recent Labs  Lab 07/14/20 0041 07/14/20 0428 07/14/20 1147 07/14/20 1301 07/15/20 0100  NA 140 140 141 136 143  K 4.1 4.4 4.6 4.1 3.9  CL 105  --  105  --  109  CO2 25  --  22  --  24  GLUCOSE 203*  --  205*  --  141*  BUN 34*  --  35*  --  30*  CREATININE 1.47*  --  1.57*  --  1.60*  CALCIUM 9.2  --  8.9  --  8.5*   GFR: Estimated Creatinine Clearance: 30.3 mL/min (A) (by C-G formula based on SCr of 1.6 mg/dL (H)). Recent Labs  Lab 07/14/20 0038 07/14/20 0730 07/14/20 1147 07/15/20 0100  WBC 6.7  --   --  9.8  LATICACIDVEN  --  3.5* 3.1*  --    Liver Function Tests: No results for input(s): AST, ALT, ALKPHOS, BILITOT, PROT, ALBUMIN in the last 168 hours. No results for input(s): LIPASE, AMYLASE in the last 168 hours. No results for input(s): AMMONIA in the last 168 hours. ABG    Component Value Date/Time   PHART 7.362 07/14/2020 1301   PCO2ART 42.2 07/14/2020 1301   PO2ART 237 (H) 07/14/2020 1301   HCO3 23.9 07/14/2020 1301  TCO2 25 07/14/2020 1301   ACIDBASEDEF 1.0 07/14/2020 1301   O2SAT 100.0 07/14/2020 1301    Coagulation Profile: Recent Labs  Lab 07/14/20 0038 07/15/20 0100  INR 1.0 1.1   Cardiac Enzymes: No results for input(s): CKTOTAL, CKMB, CKMBINDEX, TROPONINI in the last 168 hours. HbA1C: Hgb A1c MFr Bld  Date/Time Value Ref Range Status  07/14/2020 12:38 AM 7.3 (H) 4.8 - 5.6 % Final    Comment:    (NOTE) Pre diabetes:          5.7%-6.4%  Diabetes:              >6.4%  Glycemic control for   <7.0% adults with diabetes   03/25/2017 05:56 AM 7.2 (H) 4.8 - 5.6 % Final    Comment:    (NOTE)         Pre-diabetes: 5.7 - 6.4         Diabetes: >6.4         Glycemic control for adults with diabetes: <7.0    CBG: Recent Labs  Lab 07/14/20 1635 07/14/20 2034 07/15/20 0050 07/15/20 0648  GLUCAP 169* 108* 121* 135*

## 2020-07-15 NOTE — Plan of Care (Signed)
  Problem: Activity: Goal: Ability to tolerate increased activity will improve Outcome: Progressing   Problem: Cardiac: Goal: Ability to achieve and maintain adequate cardiopulmonary perfusion will improve Outcome: Progressing Goal: Vascular access site(s) Level 0-1 will be maintained Outcome: Progressing   Problem: Health Behavior/Discharge Planning: Goal: Ability to safely manage health-related needs after discharge will improve Outcome: Progressing   Problem: Education: Goal: Understanding of cardiac disease, CV risk reduction, and recovery process will improve Outcome: Not Progressing Note: Needs reinforcement with education, patient confused and unable to demonstrate understanding.  Goal: Understanding of medication regimen will improve Outcome: Not Progressing

## 2020-07-15 NOTE — Progress Notes (Signed)
Progress Note  Patient Name: [name] Date of Encounter: 07/15/2020  Primary Cardiologist: Dorris Carnes, MD  Subjective   Up in bedside chair, awake and conversant.  No chest pain or shortness of breath.  No orthopnea.  Inpatient Medications    Scheduled Meds: . aspirin  81 mg Per Tube Daily  . atorvastatin  20 mg Oral Daily  . chlorhexidine gluconate (MEDLINE KIT)  15 mL Mouth Rinse BID  . Chlorhexidine Gluconate Cloth  6 each Topical Daily  . clopidogrel  600 mg Per Tube Daily  . clopidogrel  75 mg Per Tube Q breakfast  . docusate  100 mg Oral BID  . insulin aspart  0-15 Units Subcutaneous TID WC  . insulin aspart  0-5 Units Subcutaneous QHS  . mouth rinse  15 mL Mouth Rinse 10 times per day  . pantoprazole (PROTONIX) IV  40 mg Intravenous Daily  . polyethylene glycol  17 g Oral Daily  . sodium chloride flush  3 mL Intravenous Q12H  . sodium chloride flush  3 mL Intravenous Q12H  . Warfarin - Pharmacist Dosing Inpatient   Does not apply q1600   Continuous Infusions: . sodium chloride    . sodium chloride    . sodium chloride    . ampicillin-sulbactam (UNASYN) IV 3 g (07/15/20 2633)  . fentaNYL infusion INTRAVENOUS Stopped (07/14/20 1622)  . heparin Stopped (07/14/20 1204)  . propofol (DIPRIVAN) infusion 10 mcg/kg/min (07/14/20 0831)   PRN Meds: sodium chloride, sodium chloride, acetaminophen, fentaNYL, midazolam, midazolam, ondansetron (ZOFRAN) IV, sodium chloride flush, sodium chloride flush   Vital Signs    Vitals:   07/15/20 0052 07/15/20 0100 07/15/20 0200 07/15/20 0500  BP:  (!) 118/94 (!) 103/50   Pulse:  81 77   Resp:  19 16   Temp: 97.6 F (36.4 C)   98.8 F (37.1 C)  TempSrc: Axillary   Axillary  SpO2:  99% 95%   Weight:    83.7 kg  Height:        Intake/Output Summary (Last 24 hours) at 07/15/2020 0744 Last data filed at 07/14/2020 2300 Gross per 24 hour  Intake 939.15 ml  Output 1600 ml  Net -660.85 ml   Filed Weights   07/14/20  0026 07/14/20 0054 07/15/20 0500  Weight: 72.6 kg 83.4 kg 83.7 kg    Telemetry    Sinus rhythm.  Personally reviewed.  ECG    An ECG dated 07/15/2020 was personally reviewed today and demonstrated:  Sinus rhythm with IVCD, poor R wave progression rule out old anterior infarct pattern, mild residual ST elevation inferior leads with old inferior infarct pattern.  Physical Exam   GEN:  Elderly woman.  No acute distress.   Neck: No JVD. Cardiac: RRR, no murmur, rub, or gallop.  Respiratory: Nonlabored. Clear to auscultation bilaterally. GI: Soft, nontender, bowel sounds present. MS: No edema; No deformity. Neuro:  Nonfocal. Psych: Alert and oriented x 3. Normal affect.  Labs    Chemistry Recent Labs  Lab 07/14/20 0041 07/14/20 0428 07/14/20 1147 07/14/20 1301 07/15/20 0100  NA 140   < > 141 136 143  K 4.1   < > 4.6 4.1 3.9  CL 105  --  105  --  109  CO2 25  --  22  --  24  GLUCOSE 203*  --  205*  --  141*  BUN 34*  --  35*  --  30*  CREATININE 1.47*  --  1.57*  --  1.60*  CALCIUM 9.2  --  8.9  --  8.5*  GFRNONAA 33*  --  31*  --  30*  GFRAA 39*  --  36*  --  35*  ANIONGAP 10  --  14  --  10   < > = values in this interval not displayed.     Hematology Recent Labs  Lab 07/14/20 0038 07/14/20 0038 07/14/20 0428 07/14/20 1301 07/15/20 0100  WBC 6.7  --   --   --  9.8  RBC 3.64*  --   --   --  3.30*  HGB 10.0*   < > 11.9* 9.9* 9.0*  HCT 32.9*   < > 35.0* 29.0* 29.5*  MCV 90.4  --   --   --  89.4  MCH 27.5  --   --   --  27.3  MCHC 30.4  --   --   --  30.5  RDW 14.5  --   --   --  14.6  PLT 338  --   --   --  275   < > = values in this interval not displayed.    Cardiac Enzymes Recent Labs  Lab 07/14/20 0041 07/14/20 0212 07/14/20 0730 07/14/20 1147  TROPONINIHS 264* 315* 604* 3,485*    BNP Recent Labs  Lab 07/14/20 0212  BNP 397.2*     Radiology    CARDIAC CATHETERIZATION  Result Date: 07/14/2020 1.  Multivessel coronary artery disease  with patency of the left mainstem, patency of the LAD including the stented segment in that vessel, severe stenosis of the 1st diagonal branch of the LAD, severe diffuse stenosis of a small left circumflex, moderately severe stenosis of the ramus intermedius, and subtotal occlusion of the distal RCA 2.  Successful PCI of the RCA using a 3.0 x 26 mm resolute Onyx DES 3.  Normal LVEDP Recommendations: Post MI medical therapy, once cangrelor infusion is completed would load with clopidogrel 600 mg x 1 and 75 mg daily.  Restart warfarin today as long as no further procedures planned.  Recommend medical therapy for residual CAD.  DG Chest Portable 1 View  Result Date: 07/14/2020 CLINICAL DATA:  OG and endotracheal tube placements EXAM: PORTABLE CHEST 1 VIEW COMPARISON:  07/14/2020 FINDINGS: Endotracheal tube placed with tip measuring 4.8 cm above the carina. Enteric tube tip is off the field of view but below the left hemidiaphragm. Cardiac enlargement. Perihilar infiltration or edema. Right upper lung consolidation is improving since prior study. No pleural effusions. No pneumothorax. Calcified and tortuous aorta. IMPRESSION: Appliances appear in satisfactory position. Cardiac enlargement with perihilar infiltration or edema. Improving consolidation in the right upper lung. Electronically Signed   By: Lucienne Capers M.D.   On: 07/14/2020 03:47   DG Chest Port 1 View  Result Date: 07/14/2020 CLINICAL DATA:  Shortness of breath. Chest pain and numbness to the left fingers. Swelling in the left foot and ankle EXAM: PORTABLE CHEST 1 VIEW COMPARISON:  07/14/2020 FINDINGS: Shallow inspiration. Cardiac enlargement. Infiltration and consolidation in the right upper lung likely representing pneumonia. Perihilar infiltrates and interstitial changes may represent edema or multifocal pneumonia. No pleural effusions. No pneumothorax. Calcification of the aorta. Degenerative changes in the spine and shoulders. IMPRESSION:  Cardiac enlargement with bilateral perihilar infiltrates and consolidation in the right upper lung. There is progression since the previous study from earlier today. Electronically Signed   By: Lucienne Capers M.D.   On: 07/14/2020 02:25   DG Chest Portable  1 View  Result Date: 07/14/2020 CLINICAL DATA:  Chest pain. EXAM: PORTABLE CHEST 1 VIEW COMPARISON:  October 11, 2014 FINDINGS: Mild to moderate severity diffuse chronic appearing increased interstitial lung markings are noted. There is no evidence of focal consolidation, pleural effusion or pneumothorax. The cardiac silhouette is moderately enlarged. There is marked severity calcification of the aortic arch. A chronic deformity is seen involving the right humeral head. IMPRESSION: Chronic appearing increased interstitial lung markings without evidence of acute or active cardiopulmonary disease. Electronically Signed   By: Virgina Norfolk M.D.   On: 07/14/2020 00:52   DG Abd Portable 1 View  Result Date: 07/14/2020 CLINICAL DATA:  OG tube placement EXAM: PORTABLE ABDOMEN - 1 VIEW COMPARISON:  None. FINDINGS: Enteric tube tip is in the upper abdomen consistent with location in the distal stomach. Gas and stool throughout the colon. No small or large bowel distention. Degenerative changes in the spine and hips. Postoperative change in the right hip. IMPRESSION: Enteric tube tip in the upper abdomen consistent with location in the distal stomach. Electronically Signed   By: Lucienne Capers M.D.   On: 07/14/2020 03:48   ECHOCARDIOGRAM COMPLETE  Result Date: 07/14/2020    ECHOCARDIOGRAM REPORT   Patient Name:   AALIYANA FREDERICKS Kolbe Date of Exam: 07/14/2020 Medical Rec #:  [id number]        Height:       65.5 in Accession #:    [id number]       Weight:       183.9 lb Date of Birth:  [birth year]        BSA:          1.920 m Patient Age:    79 years         BP:           68/39 mmHg Patient Gender: F                HR:           63 bpm. Exam Location:  Inpatient  Procedure: 2D Echo, Cardiac Doppler, Color Doppler and Intracardiac            Opacification Agent Indications:    Abnormal ECG  History:        Patient has prior history of Echocardiogram examinations, most                 recent 03/25/2017. CAD, Arrythmias:Atrial Fibrillation; Risk                 Factors:Hypertension, Diabetes and Former Smoker. CKD.  Sonographer:    Clayton Lefort RDCS (AE) Referring Phys: [id number] Alcario Drought CONIGLIO  Sonographer Comments: Echo performed with patient supine and on artificial respirator. IMPRESSIONS  1. Septal hypokinesis with mild LV dysfunction.  2. Left ventricular ejection fraction, by estimation, is 45 to 50%. The left ventricle has mildly decreased function. The left ventricle demonstrates regional wall motion abnormalities (see scoring diagram/findings for description). There is moderate left ventricular hypertrophy. Left ventricular diastolic parameters are consistent with Grade II diastolic dysfunction (pseudonormalization). Elevated left atrial pressure.  3. Right ventricular systolic function is normal. The right ventricular size is normal.  4. Left atrial size was severely dilated.  5. The mitral valve is normal in structure. No evidence of mitral valve regurgitation. No evidence of mitral stenosis. Severe mitral annular calcification.  6. The aortic valve is tricuspid. Aortic valve regurgitation is not visualized. Mild to moderate aortic valve stenosis.  7. The inferior  vena cava is dilated in size with <50% respiratory variability, suggesting right atrial pressure of 15 mmHg. FINDINGS  Left Ventricle: Left ventricular ejection fraction, by estimation, is 45 to 50%. The left ventricle has mildly decreased function. The left ventricle demonstrates regional wall motion abnormalities. Definity contrast agent was given IV to delineate the left ventricular endocardial borders. The left ventricular internal cavity size was normal in size. There is moderate left ventricular  hypertrophy. Left ventricular diastolic parameters are consistent with Grade II diastolic dysfunction (pseudonormalization). Elevated left atrial pressure. Right Ventricle: The right ventricular size is normal. Right ventricular systolic function is normal. Left Atrium: Left atrial size was severely dilated. Right Atrium: Right atrial size was normal in size. Pericardium: Trivial pericardial effusion is present. Mitral Valve: The mitral valve is normal in structure. Severe mitral annular calcification. No evidence of mitral valve regurgitation. No evidence of mitral valve stenosis. MV peak gradient, 7.1 mmHg. The mean mitral valve gradient is 2.0 mmHg. Tricuspid Valve: The tricuspid valve is normal in structure. Tricuspid valve regurgitation is trivial. No evidence of tricuspid stenosis. Aortic Valve: The aortic valve is tricuspid. Aortic valve regurgitation is not visualized. Mild to moderate aortic stenosis is present. Aortic valve mean gradient measures 10.0 mmHg. Aortic valve peak gradient measures 17.0 mmHg. Aortic valve area, by VTI measures 1.40 cm. Pulmonic Valve: The pulmonic valve was normal in structure. Pulmonic valve regurgitation is not visualized. No evidence of pulmonic stenosis. Aorta: The aortic root is normal in size and structure. Venous: The inferior vena cava is dilated in size with less than 50% respiratory variability, suggesting right atrial pressure of 15 mmHg. IAS/Shunts: No atrial level shunt detected by color flow Doppler. Additional Comments: Septal hypokinesis with mild LV dysfunction.  LEFT VENTRICLE PLAX 2D LVIDd:         4.60 cm  Diastology LVIDs:         3.40 cm  LV e' medial:    4.46 cm/s LV PW:         1.70 cm  LV E/e' medial:  27.1 LV IVS:        1.80 cm  LV e' lateral:   9.68 cm/s LVOT diam:     2.20 cm  LV E/e' lateral: 12.5 LV SV:         73 LV SV Index:   38 LVOT Area:     3.80 cm  RIGHT VENTRICLE             IVC RV Basal diam:  3.20 cm     IVC diam: 2.70 cm RV S prime:      10.80 cm/s TAPSE (M-mode): 2.0 cm LEFT ATRIUM              Index       RIGHT ATRIUM           Index LA diam:        3.60 cm  1.88 cm/m  RA Area:     17.00 cm LA Vol (A2C):   114.0 ml 59.39 ml/m RA Volume:   41.50 ml  21.62 ml/m LA Vol (A4C):   84.0 ml  43.76 ml/m LA Biplane Vol: 98.7 ml  51.42 ml/m  AORTIC VALVE AV Area (Vmax):    1.44 cm AV Area (Vmean):   1.36 cm AV Area (VTI):     1.40 cm AV Vmax:           206.00 cm/s AV Vmean:  144.667 cm/s AV VTI:            0.523 m AV Peak Grad:      17.0 mmHg AV Mean Grad:      10.0 mmHg LVOT Vmax:         78.30 cm/s LVOT Vmean:        51.800 cm/s LVOT VTI:          0.192 m LVOT/AV VTI ratio: 0.37  AORTA Ao Root diam: 3.00 cm Ao Asc diam:  3.60 cm MITRAL VALVE MV Area (PHT): 3.21 cm     SHUNTS MV Peak grad:  7.1 mmHg     Systemic VTI:  0.19 m MV Mean grad:  2.0 mmHg     Systemic Diam: 2.20 cm MV Vmax:       1.33 m/s MV Vmean:      62.4 cm/s MV Decel Time: 236 msec MV E velocity: 121.00 cm/s MV A velocity: 88.10 cm/s MV E/A ratio:  1.37 Kirk Ruths MD Electronically signed by Kirk Ruths MD Signature Date/Time: 07/14/2020/1:32:31 PM    Final     Patient Profile     79 y.o. female with a history of CAD (drug-eluting stent to mid RCA 2009, drug-eluting stent to mid LAD in 2006), hypertension, hyperlipidemia, PE reportedly on warfarin, admitted with inferior STEMI.  Assessment & Plan    1.  Inferior STEMI status post DES to the distal RCA on October 1.  LVEF 45 to 50% and normal RV contraction by echocardiogram.  2.  Status post flash pulmonary edema with hypoxic respiratory failure in the setting of problem #1 requiring intubation and mechanical ventilation, extubated yesterday to BiPAP and on nasal cannula this morning.  3.  CAD with history of DES to the mid LAD in 2006 as well as mid RCA in 2009.  The stents were patent and recent angiography with mild in-stent restenosis.  Residual disease includes 80% first diagonal, diffusely diseased  80% small circumflex, and 75% ramus intermedius, all managed medically at this time.  4.  Essential hypertension, outpatient medications included enalapril, HCTZ, Lopressor, and Norvasc.  Recent systolics running 573-220 range.  5.  Mixed hyperlipidemia, recent LDL 91.  Lipitor increased to 20 mg daily.  6.  History of pulmonary embolus, Coumadin resumed.  7.  CKD stage IIIb with acute renal insufficiency.  Creatinine 1.6.  8.  Anemia, acute on chronic due to blood loss.  Discussed with critical care team.  Patient will be transferred out of the unit to progressive care.  Continue aspirin, Plavix, Lipitor, DVT prophylaxis with Lovenox, Coumadin resumed per pharmacy.  Hold off on resuming ARB at this point.  Will start low-dose Coreg.  Progress with cardiac rehab.  Signed, Rozann Lesches, MD  07/15/2020, 7:44 AM

## 2020-07-15 NOTE — Progress Notes (Signed)
Pt SpO2 88% while sitting up in recliner at rest on room air, pt placed on 2L nasal cannula. SpO2 94%. No complains at this time, will continue to monitor.

## 2020-07-15 NOTE — Progress Notes (Signed)
ANTICOAGULATION CONSULT NOTE - Initial Consult  Pharmacy Consult for warfarin Indication: atrial fibrillation  No Known Allergies  Patient Measurements: Height: 5' 5.5" (166.4 cm) Weight: 83.7 kg (184 lb 8.4 oz) IBW/kg (Calculated) : 58.15  Vital Signs: Temp: 98.8 F (37.1 C) (10/02 0500) Temp Source: Axillary (10/02 0500) BP: 111/48 (10/02 0700) Pulse Rate: 75 (10/02 0700)  Labs: Recent Labs    07/14/20 0038 07/14/20 0038 07/14/20 0041 07/14/20 0041 07/14/20 5809 07/14/20 0428 07/14/20 0428 07/14/20 0730 07/14/20 1147 07/14/20 1301 07/15/20 0100  HGB 10.0*   < >  --   --   --  11.9*   < >  --   --  9.9* 9.0*  HCT 32.9*   < >  --   --   --  35.0*  --   --   --  29.0* 29.5*  PLT 338  --   --   --   --   --   --   --   --   --  275  APTT 24  --   --   --   --   --   --   --   --   --   --   LABPROT 12.8  --   --   --   --   --   --   --   --   --  13.6  INR 1.0  --   --   --   --   --   --   --   --   --  1.1  HEPARINUNFRC  --   --   --   --   --   --   --   --  0.38  --  <0.10*  CREATININE  --   --  1.47*  --   --   --   --   --  1.57*  --  1.60*  TROPONINIHS  --   --  264*   < > 315*  --   --  604* 3,485*  --   --    < > = values in this interval not displayed.    Estimated Creatinine Clearance: 30.3 mL/min (A) (by C-G formula based on SCr of 1.6 mg/dL (H)).   Medical History: Past Medical History:  Diagnosis Date  . Arthritis   . Atrial flutter ([place])    typical appearing  . Bilateral pulmonary embolism ([place]) 08/2008   On coumadin  . Coronary artery disease    s/p DES to mid RCA in 02/2008; s/p inferior MI with DES to mid RCA and mid LAD in 05/2005  . Coronary atherosclerosis   . Diabetes mellitus without complication ([place])   . Hyperlipidemia   . Hypertension    Controlled  . Ischemic cardiomyopathy   . Memory disorder 07/08/2017  . Myocardial infarction ([employer])   . Stroke ([place])   . Unspecified combined systolic and diastolic heart failure     Asymptomatic. Echo 04/28/13 EF= 40- 45%  . Unspecified combined systolic and diastolic heart failure     Medications:  Scheduled:  . aspirin  81 mg Oral Daily  . atorvastatin  20 mg Oral Daily  . Chlorhexidine Gluconate Cloth  6 each Topical Daily  . clopidogrel  75 mg Oral Q breakfast  . docusate sodium  100 mg Oral BID  . enoxaparin (LOVENOX) injection  30 mg Subcutaneous Q24H  . insulin aspart  0-15 Units Subcutaneous TID WC  .  insulin aspart  0-5 Units Subcutaneous QHS  . polyethylene glycol  17 g Oral Daily  . sodium chloride flush  3 mL Intravenous Q12H  . sodium chloride flush  3 mL Intravenous Q12H  . warfarin  5 mg Oral ONCE-1600  . Warfarin - Pharmacist Dosing Inpatient   Does not apply q1600    Assessment: 74 yof who presenting with question ST elevation, which resolved in setting of acute hypoxic resp failure likely secondary to pulm edema. On warfarin PTA - admission INR 1 (unknown timing of last dose).   Was on heparin prior to cath where received DES to RCA. Hgb down to 9 this morning, will watch closely, plt 275. No signs or symptoms of bleeding. Patient to continue triple therapy with asa/plavix/warfarin at least 1 month.   PTA regimen 5 mg daily except 7.5 mg qSat  Goal of Therapy:  INR 2-3 Monitor platelets by anticoagulation protocol: Yes   Plan:  Warfarin 7.5 mg tonight Monitor INR, CBC, and for s/sx of bleeding   Erin Hearing PharmD., BCPS Clinical Pharmacist 07/15/2020 8:08 AM   Please check AMION for all [place] phone numbers After 10:00 PM, call [place] [phone]

## 2020-07-15 NOTE — Progress Notes (Signed)
CARDIAC REHAB PHASE I   PRE:  Rate/Rhythm: 83 NSR    BP: sitting 112/51    SaO2: 97 RA  MODE:  Ambulation: 250 ft   POST:  Rate/Rhythm: 106 ST    BP: sitting 174/74     SaO2: 98 Ra  0940-1010 Patient sitting up in recliner upon arrival to room. Eager to walk. Assisted to bathroom. Patient able to ambulate with RW independently and toilet independently. Ambulated in hallway with RW. Uses RW at home. Steady gait noted. Patient able to ambulate and speak simultaneous without shortness of breath. No rest breaks taken. Post ambulation patient back to recliner with call bell and phone in reach. Patient encouraged to request assistance with ambulation later today. Patient verbalized understanding. Anticipate D/C Monday.   Nakeisha Greenhouse Minus Breeding RN, BSN

## 2020-07-16 ENCOUNTER — Inpatient Hospital Stay (HOSPITAL_COMMUNITY): Payer: Medicare Other

## 2020-07-16 DIAGNOSIS — I5031 Acute diastolic (congestive) heart failure: Secondary | ICD-10-CM

## 2020-07-16 LAB — BASIC METABOLIC PANEL
Anion gap: 13 (ref 5–15)
BUN: 29 mg/dL — ABNORMAL HIGH (ref 8–23)
CO2: 21 mmol/L — ABNORMAL LOW (ref 22–32)
Calcium: 9 mg/dL (ref 8.9–10.3)
Chloride: 105 mmol/L (ref 98–111)
Creatinine, Ser: 1.57 mg/dL — ABNORMAL HIGH (ref 0.44–1.00)
GFR calc Af Amer: 36 mL/min — ABNORMAL LOW (ref >60)
GFR calc non Af Amer: 31 mL/min — ABNORMAL LOW (ref >60)
Glucose, Bld: 210 mg/dL — ABNORMAL HIGH (ref 70–99)
Potassium: 3.9 mmol/L (ref 3.5–5.1)
Sodium: 139 mmol/L (ref 135–145)

## 2020-07-16 LAB — CBC
HCT: 30.5 % — ABNORMAL LOW (ref 36.0–46.0)
Hemoglobin: 9.6 g/dL — ABNORMAL LOW (ref 12.0–15.0)
MCH: 27.9 pg (ref 26.0–34.0)
MCHC: 31.5 g/dL (ref 30.0–36.0)
MCV: 88.7 fL (ref 80.0–100.0)
Platelets: 249 10*3/uL (ref 150–400)
RBC: 3.44 MIL/uL — ABNORMAL LOW (ref 3.87–5.11)
RDW: 14.9 % (ref 11.5–15.5)
WBC: 9.6 10*3/uL (ref 4.0–10.5)
nRBC: 0 % (ref 0.0–0.2)

## 2020-07-16 LAB — GLUCOSE, CAPILLARY
Glucose-Capillary: 259 mg/dL — ABNORMAL HIGH (ref 70–99)
Glucose-Capillary: 257 mg/dL — ABNORMAL HIGH (ref 70–99)
Glucose-Capillary: 172 mg/dL — ABNORMAL HIGH (ref 70–99)
Glucose-Capillary: 225 mg/dL — ABNORMAL HIGH (ref 70–99)

## 2020-07-16 LAB — PROTIME-INR
INR: 1.2 (ref 0.8–1.2)
Prothrombin Time: 14.4 seconds (ref 11.4–15.2)

## 2020-07-16 MED ORDER — LEVALBUTEROL HCL 1.25 MG/0.5ML IN NEBU
INHALATION_SOLUTION | RESPIRATORY_TRACT | Status: AC
  Filled 2020-07-16: qty 0.5

## 2020-07-16 MED ORDER — INFLUENZA VAC A&B SA ADJ QUAD 0.5 ML IM PRSY
0.5000 mL | PREFILLED_SYRINGE | INTRAMUSCULAR | Status: AC
  Administered 2020-07-17: 0.5 mL via INTRAMUSCULAR
  Filled 2020-07-16: qty 0.5

## 2020-07-16 MED ORDER — FUROSEMIDE 10 MG/ML IJ SOLN
80.0000 mg | Freq: Once | INTRAMUSCULAR | Status: AC
  Administered 2020-07-16: 80 mg via INTRAVENOUS
  Filled 2020-07-16: qty 8

## 2020-07-16 MED ORDER — PNEUMOCOCCAL VAC POLYVALENT 25 MCG/0.5ML IJ INJ
0.5000 mL | INJECTION | INTRAMUSCULAR | Status: AC
  Administered 2020-07-17: 0.5 mL via INTRAMUSCULAR
  Filled 2020-07-16: qty 0.5

## 2020-07-16 MED ORDER — WARFARIN SODIUM 7.5 MG PO TABS
7.5000 mg | ORAL_TABLET | Freq: Once | ORAL | Status: AC
  Administered 2020-07-16: 7.5 mg via ORAL
  Filled 2020-07-16: qty 1

## 2020-07-16 MED ORDER — NITROGLYCERIN IN D5W 200-5 MCG/ML-% IV SOLN
10.0000 ug/min | INTRAVENOUS | Status: DC
  Administered 2020-07-16: 10 ug/min via INTRAVENOUS
  Filled 2020-07-16: qty 250

## 2020-07-16 MED ORDER — CARVEDILOL 6.25 MG PO TABS
6.2500 mg | ORAL_TABLET | Freq: Two times a day (BID) | ORAL | Status: DC
  Administered 2020-07-16 – 2020-07-22 (×12): 6.25 mg via ORAL
  Filled 2020-07-16 (×12): qty 1

## 2020-07-16 MED ORDER — LEVALBUTEROL HCL 0.63 MG/3ML IN NEBU
0.6300 mg | INHALATION_SOLUTION | RESPIRATORY_TRACT | Status: AC
  Administered 2020-07-16: 0.63 mg via RESPIRATORY_TRACT
  Filled 2020-07-16: qty 3

## 2020-07-16 MED ORDER — ATORVASTATIN CALCIUM 40 MG PO TABS
40.0000 mg | ORAL_TABLET | Freq: Every day | ORAL | Status: DC
  Administered 2020-07-16 – 2020-07-22 (×7): 40 mg via ORAL
  Filled 2020-07-16 (×7): qty 1

## 2020-07-16 NOTE — Progress Notes (Addendum)
   07/16/20 0425  Assess: MEWS Score  Temp 99.8 F (37.7 C)  BP (!) 174/75  Pulse Rate (!) 121  ECG Heart Rate (!) 121  Resp (!) 30  Level of Consciousness Alert  SpO2 90 %  O2 Device Nasal Cannula  O2 Flow Rate (L/min) 4 L/min  Assess: MEWS Score  MEWS Temp 0  MEWS Systolic 0  MEWS Pulse 2  MEWS RR 2  MEWS LOC 0  MEWS Score 4  MEWS Score Color Red  Assess: if the MEWS score is Yellow or Red  Were vital signs taken at a resting state? Yes  Focused Assessment Change from prior assessment (see assessment flowsheet)  Early Detection of Sepsis Score *See Row Information* Low  MEWS guidelines implemented *See Row Information* Yes  Treat  Pain Scale 0-10  Take Vital Signs  Increase Vital Sign Frequency  Red: Q 1hr X 4 then Q 4hr X 4, if remains red, continue Q 4hrs  Escalate  MEWS: Escalate Red: discuss with charge nurse/RN and provider, consider discussing with RRT  Notify: Charge Nurse/RN  Name of Charge Nurse/RN Notified Fred  Date Charge Nurse/RN Notified 07/16/20  Time Charge Nurse/RN Notified 0430  Notify: Provider  Provider Name/Title Pace  Date Provider Notified 07/16/20  Time Provider Notified [phone]  Notification Type Page  Notification Reason Change in status  Response See new orders  Date of Provider Response 07/16/20  Time of Provider Response 0449  Notify: Rapid Response  Name of Rapid Response RN Notified Mindy RN  Date Rapid Response Notified 07/16/20  Time Rapid Response Notified 0437  Document  Patient Outcome Not stable and remains on department  Progress note created (see row info) Yes   Pt complains of SOB, SpO2 90% on 4L, RR 30's, crackles, wheezing upon auscultation. Red MEWS protocol initiated. Cardiology paged. RT and Rapid response came to see pt.   CXR, nitro gtt and 80mg  IV lasix ordered.   0604 Pt placed on Bipap. HR 102, RR 25, BP 128/58, SpO2 95%.   Pt resting comfortably in bed at this time. Yellow MEWS currently. Will continue to  monitor, will notify day shift RN.

## 2020-07-16 NOTE — Significant Event (Addendum)
Rapid Response Event Note   Reason for Call :  Shortness of breath  Initial Focused Assessment:  Pt laying in bed with eyes open. Pt is alert and oriented. Her breathing is tachypneic and mildly labored. She denies chest pain but does feel SOB. Lungs with scattered crackles t/o and wheezing on R. Skin warm and dry. T-99.8, HR-121, BP-174/75, RR-30, SpO2-92% on 4L [place].      Interventions:  EKG-ST PCXR 80mg  IV lasix NTG gtt Xopenex tx  Plan of Care:  Await PCXR results. Give lasix, xopenex, and start NTG. Monitor response. Continue to monitor pt closely. Call RRT if further assistance needed.    Event Summary:   MD Notified: Dr. Neena Rhymes Call [place] [id number] End Time:0520   Update: 7215-UN still tachycardic, tachypneic, and breathing is more labored than prior. Pt is agreeable to wear bipap. Bipap ordered and initiated.   Dillard Essex, RN

## 2020-07-16 NOTE — Progress Notes (Addendum)
ANTICOAGULATION CONSULT NOTE  Pharmacy Consult for warfarin Indication: atrial fibrillation  No Known Allergies  Patient Measurements: Height: 5' 5.5" (166.4 cm) Weight: 85.7 kg (188 lb 15 oz) IBW/kg (Calculated) : 58.15  Vital Signs: Temp: 99.1 F (37.3 C) (10/03 0736) Temp Source: Oral (10/03 0425) BP: 131/63 (10/03 0736) Pulse Rate: 103 (10/03 0736)  Labs: Recent Labs    07/14/20 0038 07/14/20 0041 07/14/20 0212 07/14/20 0428 07/14/20 0730 07/14/20 1147 07/14/20 1301 07/14/20 1301 07/15/20 0100 07/16/20 0330  HGB 10.0*  --   --    < >  --   --  9.9*   < > 9.0* 9.6*  HCT 32.9*  --   --    < >  --   --  29.0*  --  29.5* 30.5*  PLT 338  --   --   --   --   --   --   --  275 249  APTT 24  --   --   --   --   --   --   --   --   --   LABPROT 12.8  --   --   --   --   --   --   --  13.6 14.4  INR 1.0  --   --   --   --   --   --   --  1.1 1.2  HEPARINUNFRC  --   --   --   --   --  0.38  --   --  <0.10*  --   CREATININE  --    < >  --   --   --  1.57*  --   --  1.60* 1.57*  TROPONINIHS  --    < > 315*  --  604* 3,485*  --   --   --   --    < > = values in this interval not displayed.    Estimated Creatinine Clearance: 31.2 mL/min (A) (by C-G formula based on SCr of 1.57 mg/dL (H)).   Assessment: 58 yof who presenting with question ST elevation, which resolved in setting of acute hypoxic resp failure likely secondary to pulm edema. On warfarin PTA - admission INR 1 (unknown timing of last dose). Was on heparin prior to cath where received DES to RCA. Patient to continue triple therapy with asa/plavix/warfarin at least 1 month. PTA regimen 5 mg daily except 7.5 mg qSat  CBC is stable today, no bleeding noted. INR is 1.2 after initial dose of warfarin    Goal of Therapy:  INR 2-3 Monitor platelets by anticoagulation protocol: Yes   Plan:  Warfarin 7.5 mg tonight Monitor INR, CBC, and for s/sx of bleeding   Norina Buzzard, PharmD PGY1 Pharmacy Resident 07/16/2020  8:01 AM   Please check AMION for all [place] phone numbers After 10:00 PM, call [place] [phone]

## 2020-07-16 NOTE — Progress Notes (Signed)
Progress Note  Patient Name: [name] Date of Encounter: 07/16/2020  [facility] Cardiologist: Dorris Carnes, MD   Subjective   No CP; had episode of dyspnea this morning.  Chest x-ray showed pulmonary edema.  Now improving.  Inpatient Medications    Scheduled Meds: . aspirin  81 mg Oral Daily  . atorvastatin  20 mg Oral Daily  . carvedilol  3.125 mg Oral BID WC  . Chlorhexidine Gluconate Cloth  6 each Topical Daily  . clopidogrel  75 mg Oral Q breakfast  . docusate sodium  100 mg Oral BID  . enoxaparin (LOVENOX) injection  30 mg Subcutaneous Q24H  . insulin aspart  0-15 Units Subcutaneous TID WC  . insulin aspart  0-5 Units Subcutaneous QHS  . polyethylene glycol  17 g Oral Daily  . sodium chloride flush  3 mL Intravenous Q12H  . sodium chloride flush  3 mL Intravenous Q12H  . warfarin  7.5 mg Oral ONCE-1600  . Warfarin - Pharmacist Dosing Inpatient   Does not apply q1600   Continuous Infusions: . sodium chloride    . sodium chloride    . sodium chloride    . nitroGLYCERIN 10 mcg/min (07/16/20 0521)   PRN Meds: sodium chloride, sodium chloride, acetaminophen, ondansetron (ZOFRAN) IV, sodium chloride flush, sodium chloride flush   Vital Signs    Vitals:   07/16/20 0630 07/16/20 0701 07/16/20 0736 07/16/20 0801  BP: 124/61  131/63   Pulse: (!) 102  (!) 103   Resp: (!) 26  (!) 22   Temp:   99.1 F (37.3 C)   TempSrc:      SpO2: 96%  98% 93%  Weight:  85.7 kg    Height:        Intake/Output Summary (Last 24 hours) at 07/16/2020 0904 Last data filed at 07/16/2020 0739 Gross per 24 hour  Intake 240 ml  Output 452 ml  Net -212 ml   Last 3 Weights 07/16/2020 07/15/2020 07/14/2020  Weight (lbs) 188 lb 15 oz 184 lb 8.4 oz 183 lb 13.8 oz  Weight (kg) 85.7 kg 83.7 kg 83.4 kg      Telemetry    Sinus with PVCs- Personally Reviewed  Physical Exam   GEN: No acute distress.   Neck: No JVD Cardiac: RRR, 2/6 systolic murmur Respiratory: Clear to  auscultation bilaterally. GI: Soft, nontender, non-distended; right groin with no hematoma  MS: No edema Neuro:  Nonfocal  Psych: Normal affect   Labs    High Sensitivity Troponin:   Recent Labs  Lab 07/14/20 0041 07/14/20 0212 07/14/20 0730 07/14/20 1147  TROPONINIHS 264* 315* 604* 3,485*      Chemistry Recent Labs  Lab 07/14/20 1147 07/14/20 1147 07/14/20 1301 07/15/20 0100 07/16/20 0330  NA 141   < > 136 143 139  K 4.6   < > 4.1 3.9 3.9  CL 105  --   --  109 105  CO2 22  --   --  24 21*  GLUCOSE 205*  --   --  141* 210*  BUN 35*  --   --  30* 29*  CREATININE 1.57*  --   --  1.60* 1.57*  CALCIUM 8.9  --   --  8.5* 9.0  GFRNONAA 31*  --   --  30* 31*  GFRAA 36*  --   --  35* 36*  ANIONGAP 14  --   --  10 13   < > = values in  this interval not displayed.     Hematology Recent Labs  Lab 07/14/20 0038 07/14/20 0428 07/14/20 1301 07/15/20 0100 07/16/20 0330  WBC 6.7  --   --  9.8 9.6  RBC 3.64*  --   --  3.30* 3.44*  HGB 10.0*   < > 9.9* 9.0* 9.6*  HCT 32.9*   < > 29.0* 29.5* 30.5*  MCV 90.4  --   --  89.4 88.7  MCH 27.5  --   --  27.3 27.9  MCHC 30.4  --   --  30.5 31.5  RDW 14.5  --   --  14.6 14.9  PLT 338  --   --  275 249   < > = values in this interval not displayed.    BNP Recent Labs  Lab 07/14/20 0212  BNP 397.2*     Radiology    CARDIAC CATHETERIZATION  Result Date: 07/14/2020 1.  Multivessel coronary artery disease with patency of the left mainstem, patency of the LAD including the stented segment in that vessel, severe stenosis of the 1st diagonal branch of the LAD, severe diffuse stenosis of a small left circumflex, moderately severe stenosis of the ramus intermedius, and subtotal occlusion of the distal RCA 2.  Successful PCI of the RCA using a 3.0 x 26 mm resolute Onyx DES 3.  Normal LVEDP Recommendations: Post MI medical therapy, once cangrelor infusion is completed would load with clopidogrel 600 mg x 1 and 75 mg daily.  Restart  warfarin today as long as no further procedures planned.  Recommend medical therapy for residual CAD.  DG CHEST PORT 1 VIEW  Result Date: 07/16/2020 CLINICAL DATA:  Hypoxemia EXAM: PORTABLE CHEST 1 VIEW COMPARISON:  Chest radiograph from one day prior. FINDINGS: Stable cardiomediastinal silhouette with mild cardiomegaly. No pneumothorax. Small bilateral pleural effusions, slightly increased. Mild diffuse prominence of the parahilar interstitial markings and increased hazy opacity in upper right lung. IMPRESSION: Stable mild cardiomegaly. Mild diffuse prominence of the parahilar interstitial markings and increased hazy opacity in the upper right lung, favor worsening asymmetric pulmonary edema. Small bilateral pleural effusions, slightly increased. Electronically Signed   By: Ilona Sorrel M.D.   On: 07/16/2020 05:06   Portable Chest xray  Result Date: 07/15/2020 CLINICAL DATA:  Respiratory failure EXAM: PORTABLE CHEST 1 VIEW COMPARISON:  July 14, 2020 FINDINGS: The cardiomediastinal silhouette is unchanged and enlarged in contour.Atherosclerotic calcifications of the aorta. Interval extubation and removal of enteric tube. No pleural effusion. No pneumothorax. Mild bronchial wall thickening and interstitial prominence, improved in comparison to prior. Continued improvement in consolidative opacity of the RIGHT upper lung. Visualized abdomen is unremarkable. Multilevel degenerative changes of the thoracic spine. IMPRESSION: 1. Interval extubation and removal of enteric tube. 2. Continued improvement in pulmonary edema and RIGHT upper lobe consolidative opacity. Electronically Signed   By: Valentino Saxon MD   On: 07/15/2020 08:55   ECHOCARDIOGRAM COMPLETE  Result Date: 07/14/2020    ECHOCARDIOGRAM REPORT   Patient Name:   [name] Date of Exam: 07/14/2020 Medical Rec #:  [id number]        Height:       65.5 in Accession #:    [id number]       Weight:       183.9 lb Date of Birth:  May 25, 1941         BSA:          1.920 m Patient Age:    79 years  BP:           68/39 mmHg Patient Gender: F                HR:           63 bpm. Exam Location:  Inpatient Procedure: 2D Echo, Cardiac Doppler, Color Doppler and Intracardiac            Opacification Agent Indications:    Abnormal ECG  History:        Patient has prior history of Echocardiogram examinations, most                 recent 03/25/2017. CAD, Arrythmias:Atrial Fibrillation; Risk                 Factors:Hypertension, Diabetes and Former Smoker. CKD.  Sonographer:    Clayton Lefort RDCS (AE) Referring Phys: [id number] Alcario Drought CONIGLIO  Sonographer Comments: Echo performed with patient supine and on artificial respirator. IMPRESSIONS  1. Septal hypokinesis with mild LV dysfunction.  2. Left ventricular ejection fraction, by estimation, is 45 to 50%. The left ventricle has mildly decreased function. The left ventricle demonstrates regional wall motion abnormalities (see scoring diagram/findings for description). There is moderate left ventricular hypertrophy. Left ventricular diastolic parameters are consistent with Grade II diastolic dysfunction (pseudonormalization). Elevated left atrial pressure.  3. Right ventricular systolic function is normal. The right ventricular size is normal.  4. Left atrial size was severely dilated.  5. The mitral valve is normal in structure. No evidence of mitral valve regurgitation. No evidence of mitral stenosis. Severe mitral annular calcification.  6. The aortic valve is tricuspid. Aortic valve regurgitation is not visualized. Mild to moderate aortic valve stenosis.  7. The inferior vena cava is dilated in size with <50% respiratory variability, suggesting right atrial pressure of 15 mmHg. FINDINGS  Left Ventricle: Left ventricular ejection fraction, by estimation, is 45 to 50%. The left ventricle has mildly decreased function. The left ventricle demonstrates regional wall motion abnormalities. Definity contrast agent was given  IV to delineate the left ventricular endocardial borders. The left ventricular internal cavity size was normal in size. There is moderate left ventricular hypertrophy. Left ventricular diastolic parameters are consistent with Grade II diastolic dysfunction (pseudonormalization). Elevated left atrial pressure. Right Ventricle: The right ventricular size is normal. Right ventricular systolic function is normal. Left Atrium: Left atrial size was severely dilated. Right Atrium: Right atrial size was normal in size. Pericardium: Trivial pericardial effusion is present. Mitral Valve: The mitral valve is normal in structure. Severe mitral annular calcification. No evidence of mitral valve regurgitation. No evidence of mitral valve stenosis. MV peak gradient, 7.1 mmHg. The mean mitral valve gradient is 2.0 mmHg. Tricuspid Valve: The tricuspid valve is normal in structure. Tricuspid valve regurgitation is trivial. No evidence of tricuspid stenosis. Aortic Valve: The aortic valve is tricuspid. Aortic valve regurgitation is not visualized. Mild to moderate aortic stenosis is present. Aortic valve mean gradient measures 10.0 mmHg. Aortic valve peak gradient measures 17.0 mmHg. Aortic valve area, by VTI measures 1.40 cm. Pulmonic Valve: The pulmonic valve was normal in structure. Pulmonic valve regurgitation is not visualized. No evidence of pulmonic stenosis. Aorta: The aortic root is normal in size and structure. Venous: The inferior vena cava is dilated in size with less than 50% respiratory variability, suggesting right atrial pressure of 15 mmHg. IAS/Shunts: No atrial level shunt detected by color flow Doppler. Additional Comments: Septal hypokinesis with mild LV dysfunction.  LEFT VENTRICLE PLAX 2D LVIDd:  4.60 cm  Diastology LVIDs:         3.40 cm  LV e' medial:    4.46 cm/s LV PW:         1.70 cm  LV E/e' medial:  27.1 LV IVS:        1.80 cm  LV e' lateral:   9.68 cm/s LVOT diam:     2.20 cm  LV E/e' lateral:  12.5 LV SV:         73 LV SV Index:   38 LVOT Area:     3.80 cm  RIGHT VENTRICLE             IVC RV Basal diam:  3.20 cm     IVC diam: 2.70 cm RV S prime:     10.80 cm/s TAPSE (M-mode): 2.0 cm LEFT ATRIUM              Index       RIGHT ATRIUM           Index LA diam:        3.60 cm  1.88 cm/m  RA Area:     17.00 cm LA Vol (A2C):   114.0 ml 59.39 ml/m RA Volume:   41.50 ml  21.62 ml/m LA Vol (A4C):   84.0 ml  43.76 ml/m LA Biplane Vol: 98.7 ml  51.42 ml/m  AORTIC VALVE AV Area (Vmax):    1.44 cm AV Area (Vmean):   1.36 cm AV Area (VTI):     1.40 cm AV Vmax:           206.00 cm/s AV Vmean:          144.667 cm/s AV VTI:            0.523 m AV Peak Grad:      17.0 mmHg AV Mean Grad:      10.0 mmHg LVOT Vmax:         78.30 cm/s LVOT Vmean:        51.800 cm/s LVOT VTI:          0.192 m LVOT/AV VTI ratio: 0.37  AORTA Ao Root diam: 3.00 cm Ao Asc diam:  3.60 cm MITRAL VALVE MV Area (PHT): 3.21 cm     SHUNTS MV Peak grad:  7.1 mmHg     Systemic VTI:  0.19 m MV Mean grad:  2.0 mmHg     Systemic Diam: 2.20 cm MV Vmax:       1.33 m/s MV Vmean:      62.4 cm/s MV Decel Time: 236 msec MV E velocity: 121.00 cm/s MV A velocity: 88.10 cm/s MV E/A ratio:  1.37 Kirk Ruths MD Electronically signed by Kirk Ruths MD Signature Date/Time: 07/14/2020/1:32:31 PM    Final     Patient Profile     79 y.o. female with a history of CAD(drug-eluting stent to mid RCA 2009,drug-eluting stent to mid LAD in 2006),hypertension, hyperlipidemia, PE reportedly on warfarin, admitted with inferior STEMI.  Echocardiogram shows septal hypokinesis with mild LV dysfunction and ejection fraction 45 to 50%, moderate left ventricular hypertrophy, grade 2 diastolic dysfunction, severe left atrial enlargement, mild to moderate aortic stenosis.  Cardiac catheterization revealed severe stenosis of the first diagonal, severe diffuse disease and small left circumflex, moderate stenosis in the ramus intermedius and subtotal occlusion of the RCA.   Patient had PCI of the right coronary artery with drug-eluting stent.  Assessment & Plan    1 status post inferior MI  with PCI of RCA-continue aspirin, Plavix, beta-blocker and statin.  Given need for long-term Coumadin with plan to continue aspirin and Plavix for 30 days and then discontinue aspirin and continue Plavix long-term.  2 mild to moderate aortic stenosis-patient will need follow-up echoes in the future.  3 status post flash pulmonary edema in the setting of inferior infarct requiring intubation-patient had episode of dyspnea this morning and chest x-ray showed pulmonary edema.  She was given IV Lasix and is now improving.  We will give an additional 80 mg this p.m. and reassess tomorrow morning.  Follow renal function closely.  4 hypertension-blood pressure is increasing.  Discontinue nitroglycerin.  Increase carvedilol to 6.25 mg twice daily.  Follow regimen and adjust as needed.  5 history of pulmonary embolus-Coumadin has been resumed with goal INR 2-3.  6 chronic stage III kidney disease-follow renal function closely with diuresis.  7 hyperlipidemia-given documented coronary disease will increase Lipitor to 40 mg daily.  Check lipids and liver in 12 weeks.  Need to mobilize and continue with cardiac rehab.  For questions or updates, please contact [place] Please consult [email or web address] for contact info under        Signed, Kirk Ruths, MD  07/16/2020, 9:04 AM

## 2020-07-17 DIAGNOSIS — I10 Essential (primary) hypertension: Secondary | ICD-10-CM

## 2020-07-17 DIAGNOSIS — N1831 Chronic kidney disease, stage 3a: Secondary | ICD-10-CM

## 2020-07-17 DIAGNOSIS — I350 Nonrheumatic aortic (valve) stenosis: Secondary | ICD-10-CM

## 2020-07-17 LAB — GLUCOSE, CAPILLARY
Glucose-Capillary: 129 mg/dL — ABNORMAL HIGH (ref 70–99)
Glucose-Capillary: 202 mg/dL — ABNORMAL HIGH (ref 70–99)
Glucose-Capillary: 166 mg/dL — ABNORMAL HIGH (ref 70–99)
Glucose-Capillary: 198 mg/dL — ABNORMAL HIGH (ref 70–99)

## 2020-07-17 LAB — BASIC METABOLIC PANEL
Anion gap: 11 (ref 5–15)
BUN: 33 mg/dL — ABNORMAL HIGH (ref 8–23)
CO2: 30 mmol/L (ref 22–32)
Calcium: 8.7 mg/dL — ABNORMAL LOW (ref 8.9–10.3)
Chloride: 100 mmol/L (ref 98–111)
Creatinine, Ser: 1.82 mg/dL — ABNORMAL HIGH (ref 0.44–1.00)
GFR calc Af Amer: 30 mL/min — ABNORMAL LOW (ref >60)
GFR calc non Af Amer: 26 mL/min — ABNORMAL LOW (ref >60)
Glucose, Bld: 125 mg/dL — ABNORMAL HIGH (ref 70–99)
Potassium: 3.4 mmol/L — ABNORMAL LOW (ref 3.5–5.1)
Sodium: 141 mmol/L (ref 135–145)

## 2020-07-17 LAB — PROTIME-INR
INR: 1.4 — ABNORMAL HIGH (ref 0.8–1.2)
Prothrombin Time: 17 seconds — ABNORMAL HIGH (ref 11.4–15.2)

## 2020-07-17 MED ORDER — POTASSIUM CHLORIDE CRYS ER 20 MEQ PO TBCR
40.0000 meq | EXTENDED_RELEASE_TABLET | ORAL | Status: AC
  Administered 2020-07-17: 40 meq via ORAL
  Filled 2020-07-17: qty 2

## 2020-07-17 MED ORDER — WARFARIN SODIUM 7.5 MG PO TABS
7.5000 mg | ORAL_TABLET | Freq: Once | ORAL | Status: AC
  Administered 2020-07-17: 7.5 mg via ORAL
  Filled 2020-07-17: qty 1

## 2020-07-17 MED ORDER — FUROSEMIDE 40 MG PO TABS
40.0000 mg | ORAL_TABLET | Freq: Every day | ORAL | Status: DC
  Administered 2020-07-17 – 2020-07-22 (×6): 40 mg via ORAL
  Filled 2020-07-17 (×6): qty 1

## 2020-07-17 MED ORDER — OXYMETAZOLINE HCL 0.05 % NA SOLN
1.0000 | Freq: Two times a day (BID) | NASAL | Status: DC
  Administered 2020-07-17 – 2020-07-22 (×10): 1 via NASAL
  Filled 2020-07-17: qty 30

## 2020-07-17 NOTE — Progress Notes (Signed)
Inpatient Diabetes Program Recommendations  AACE/ADA: New Consensus Statement on Inpatient Glycemic Control (2015)  Target Ranges:  Prepandial:   less than 140 mg/dL      Peak postprandial:   less than 180 mg/dL (1-2 hours)      Critically ill patients:  140 - 180 mg/dL   Results for AYSE, MCCARTIN (MRN [id number]) as of 07/17/2020 14:40  Ref. Range 07/16/2020 07:32 07/16/2020 11:23 07/16/2020 15:56 07/16/2020 21:48  Glucose-Capillary Latest Ref Range: 70 - 99 mg/dL 259 (H) 257 (H) 172 (H) 225 (H)   Results for SHARAYA, BORUFF (MRN [id number]) as of 07/17/2020 14:40  Ref. Range 07/17/2020 06:21 07/17/2020 11:11  Glucose-Capillary Latest Ref Range: 70 - 99 mg/dL 129 (H)  2 units NOVOLOG  202 (H)  5 units NOVOLOG      Home DM Meds: Metformin 1000 mg BID  Current Orders: Novolog Moderate Correction Scale/ SSI (0-15 units) TID AC + HS    MD- Please consider adding Novolog Meal Coverage:  Novolog 3 units TID with meals  (Please add the following Hold Parameters: Hold if pt eats <50% of meal, Hold if pt NPO)    --Will follow patient during hospitalization--  Wyn Quaker RN, MSN, CDE Diabetes Coordinator Inpatient Glycemic Control Team Team Pager: [phone] (8a-5p)

## 2020-07-17 NOTE — Progress Notes (Signed)
MD on call Dr, Paticia Stack notifed that patient is having frequent nose bleed and taking Coumadin. Verdene Lennert

## 2020-07-17 NOTE — Progress Notes (Signed)
CARDIAC REHAB PHASE I   PRE:  Rate/Rhythm: 86 SR    BP: lying 132/82    SaO2: 100 4 1/2L, 95 2L  MODE:  Ambulation: 130 ft   POST:  Rate/Rhythm: 107 ST    BP: sitting 138/59     SaO2: 95 2L (89-94 RA briefly)   Pt on increased O2 in bed on my arrival. Feeling well. Able to decrease O2 and walk in hall. No overt c/o, slow and steady pace with RW. To chair on chair alarm. Left on 2L. Discussed MI, restrictions, Plavix, stent, and CRPII. Will refer to [place]. She does c/o bed sore on upper buttocks. Notified RN. Can walk more later today.  [id number]  [place], ACSM 07/17/2020 10:24 AM

## 2020-07-17 NOTE — Progress Notes (Addendum)
Daughter Pam at bedside, upset and requesting to talk to SW. This nurse paged SW per request. Asked daughter if I can help in any way besides paging SW. Daughter said that she does not have nothing to talk to me "just call SW". In conversation between daughter and SW leaned that daughter is frustrated about MD not communicating about plan of care, transfer from ICU and discharge planing. SW explained role and try to address daughter concerns. This nurse offer to page MD so that MD can call for update and to answer and address family daughter concerns but family member refused to even talk to this nurse. Talked to AD and asked if she can reach out to family member and try to address concerns.

## 2020-07-17 NOTE — Progress Notes (Addendum)
Able to wean down patient to RA at rest. Oxygen at 95%

## 2020-07-17 NOTE — Progress Notes (Signed)
Progress Note  Patient Name: [name] Date of Encounter: 07/17/2020  [facility] Cardiologist: Dorris Carnes, MD   Subjective   Good diuresis overnight- net negative 1.6L. Creatinine bump to 1.82 today (from 1.57). Potassium 3.4 today. INR 1.4 on warfarin.  Inpatient Medications    Scheduled Meds: . aspirin  81 mg Oral Daily  . atorvastatin  40 mg Oral Daily  . carvedilol  6.25 mg Oral BID WC  . Chlorhexidine Gluconate Cloth  6 each Topical Daily  . clopidogrel  75 mg Oral Q breakfast  . docusate sodium  100 mg Oral BID  . enoxaparin (LOVENOX) injection  30 mg Subcutaneous Q24H  . insulin aspart  0-15 Units Subcutaneous TID WC  . insulin aspart  0-5 Units Subcutaneous QHS  . polyethylene glycol  17 g Oral Daily  . sodium chloride flush  3 mL Intravenous Q12H  . sodium chloride flush  3 mL Intravenous Q12H  . Warfarin - Pharmacist Dosing Inpatient   Does not apply q1600   Continuous Infusions: . sodium chloride    . sodium chloride    . sodium chloride     PRN Meds: sodium chloride, sodium chloride, acetaminophen, ondansetron (ZOFRAN) IV, sodium chloride flush, sodium chloride flush   Vital Signs    Vitals:   07/16/20 2350 07/17/20 0400 07/17/20 0526 07/17/20 0748  BP: (!) 114/54 138/63  137/67  Pulse: 71 80    Resp: 19 18    Temp: 98.5 F (36.9 C) 98.4 F (36.9 C)  98.1 F (36.7 C)  TempSrc: Oral   Oral  SpO2: 96% 97%    Weight:   82.7 kg   Height:        Intake/Output Summary (Last 24 hours) at 07/17/2020 0905 Last data filed at 07/17/2020 0811 Gross per 24 hour  Intake 483 ml  Output 1925 ml  Net -1442 ml   Last 3 Weights 07/17/2020 07/16/2020 07/15/2020  Weight (lbs) 182 lb 6.4 oz 188 lb 15 oz 184 lb 8.4 oz  Weight (kg) 82.736 kg 85.7 kg 83.7 kg      Telemetry    Sinus with PVCs- Personally Reviewed  Physical Exam   GEN: No acute distress.   Neck: JVP 2 cm Cardiac: RRR, 2/6 systolic murmur Respiratory: Mild bibasilar crackles. GI:  Soft, nontender, non-distended; right groin with no hematoma  MS: No edema Neuro:  Nonfocal  Psych: Normal affect   Labs    High Sensitivity Troponin:   Recent Labs  Lab 07/14/20 0041 07/14/20 0212 07/14/20 0730 07/14/20 1147  TROPONINIHS 264* 315* 604* 3,485*      Chemistry Recent Labs  Lab 07/15/20 0100 07/16/20 0330 07/17/20 0502  NA 143 139 141  K 3.9 3.9 3.4*  CL 109 105 100  CO2 24 21* 30  GLUCOSE 141* 210* 125*  BUN 30* 29* 33*  CREATININE 1.60* 1.57* 1.82*  CALCIUM 8.5* 9.0 8.7*  GFRNONAA 30* 31* 26*  GFRAA 35* 36* 30*  ANIONGAP 10 13 11      Hematology Recent Labs  Lab 07/14/20 0038 07/14/20 0428 07/14/20 1301 07/15/20 0100 07/16/20 0330  WBC 6.7  --   --  9.8 9.6  RBC 3.64*  --   --  3.30* 3.44*  HGB 10.0*   < > 9.9* 9.0* 9.6*  HCT 32.9*   < > 29.0* 29.5* 30.5*  MCV 90.4  --   --  89.4 88.7  MCH 27.5  --   --  27.3 27.9  MCHC 30.4  --   --  30.5 31.5  RDW 14.5  --   --  14.6 14.9  PLT 338  --   --  275 249   < > = values in this interval not displayed.    BNP Recent Labs  Lab 07/14/20 0212  BNP 397.2*     Radiology    DG CHEST PORT 1 VIEW  Result Date: 07/16/2020 CLINICAL DATA:  Hypoxemia EXAM: PORTABLE CHEST 1 VIEW COMPARISON:  Chest radiograph from one day prior. FINDINGS: Stable cardiomediastinal silhouette with mild cardiomegaly. No pneumothorax. Small bilateral pleural effusions, slightly increased. Mild diffuse prominence of the parahilar interstitial markings and increased hazy opacity in upper right lung. IMPRESSION: Stable mild cardiomegaly. Mild diffuse prominence of the parahilar interstitial markings and increased hazy opacity in the upper right lung, favor worsening asymmetric pulmonary edema. Small bilateral pleural effusions, slightly increased. Electronically Signed   By: Ilona Sorrel M.D.   On: 07/16/2020 05:06    Patient Profile     79 y.o. female with a history of CAD(drug-eluting stent to mid RCA 2009,drug-eluting  stent to mid LAD in 2006),hypertension, hyperlipidemia, PE reportedly on warfarin, admitted with inferior STEMI.  Echocardiogram shows septal hypokinesis with mild LV dysfunction and ejection fraction 45 to 50%, moderate left ventricular hypertrophy, grade 2 diastolic dysfunction, severe left atrial enlargement, mild to moderate aortic stenosis.  Cardiac catheterization revealed severe stenosis of the first diagonal, severe diffuse disease and small left circumflex, moderate stenosis in the ramus intermedius and subtotal occlusion of the RCA.  Patient had PCI of the right coronary artery with drug-eluting stent.  Assessment & Plan    1 status post inferior MI with PCI of RCA-continue aspirin, Plavix, beta-blocker and statin.  Given need for long-term Coumadin with plan to continue aspirin and Plavix for 30 days and then discontinue aspirin and continue Plavix long-term.  2 mild to moderate aortic stenosis-patient will need follow-up echoes in the future.  3 status post flash pulmonary edema in the setting of inferior infarct requiring intubation-patient had episode of dyspnea this morning and chest x-ray showed pulmonary edema.  She was given IV Lasix and is now improving.  Creatinine up overnight - switch to oral lasix today - persistent mild crackles.  4 hypertension-blood pressure is increasing.  Discontinue nitroglycerin.  Increase carvedilol to 6.25 mg twice daily.  Follow regimen and adjust as needed.  5 history of pulmonary embolus-Coumadin has been resumed with goal INR 2-3. INR today 1.4 - monitor until INR reaches 2.0  6 chronic stage III kidney disease-follow renal function closely with diuresis. Mild hypokalemia today - replete  7 hyperlipidemia-given documented coronary disease will increase Lipitor to 40 mg daily.  Check lipids and liver in 12 weeks.  Need to mobilize and continue with cardiac rehab. Wean oxygen as tolerated.  For questions or updates, please contact [place] Please consult [email or web address] for contact info under   Pixie Casino, MD, FACC, [place] Director of the Advanced Lipid Disorders &  Cardiovascular Risk Reduction Clinic Diplomate of the American Board of Clinical Lipidology Attending Cardiologist  Direct Dial: [phone]  Fax: [phone]  Website:  [email or web address]  Pixie Casino, MD  07/17/2020, 9:05 AM

## 2020-07-17 NOTE — Progress Notes (Signed)
ANTICOAGULATION CONSULT NOTE  Pharmacy Consult for warfarin Indication: atrial fibrillation  No Known Allergies  Patient Measurements: Height: 5' 5.5" (166.4 cm) Weight: 82.7 kg (182 lb 6.4 oz) IBW/kg (Calculated) : 58.15  Vital Signs: Temp: 98.1 F (36.7 C) (10/04 0748) Temp Source: Oral (10/04 0748) BP: 137/67 (10/04 0748) Pulse Rate: 80 (10/04 0400)  Labs: Recent Labs    07/14/20 1147 07/14/20 1301 07/14/20 1301 07/15/20 0100 07/16/20 0330 07/17/20 0502  HGB  --  9.9*   < > 9.0* 9.6*  --   HCT  --  29.0*  --  29.5* 30.5*  --   PLT  --   --   --  275 249  --   LABPROT  --   --   --  13.6 14.4 17.0*  INR  --   --   --  1.1 1.2 1.4*  HEPARINUNFRC 0.38  --   --  <0.10*  --   --   CREATININE 1.57*  --    < > 1.60* 1.57* 1.82*  TROPONINIHS 3,485*  --   --   --   --   --    < > = values in this interval not displayed.    Estimated Creatinine Clearance: 26.5 mL/min (A) (by C-G formula based on SCr of 1.82 mg/dL (H)).   Assessment: 81 yof who presenting with question ST elevation, which resolved in setting of acute hypoxic resp failure likely secondary to pulm edema. On warfarin PTA - admission INR 1 (unknown timing of last dose). Was on heparin prior to cath where received DES to RCA. Patient to continue triple therapy with asa/plavix/warfarin at least 1 month. PTA regimen 5 mg daily except 7.5 mg qSat  INR trended up to 1.4 today. We will repeat 7.5mg  today.   Goal of Therapy:  INR 2-3 Monitor platelets by anticoagulation protocol: Yes   Plan:  Warfarin 7.5 mg tonight Monitor INR, CBC, and for s/sx of bleeding   Onnie Boer, PharmD, BCIDP, AAHIVP, CPP Infectious Disease Pharmacist 07/17/2020 10:51 AM

## 2020-07-17 NOTE — Progress Notes (Signed)
   Called and updated daughter [name] about her mother's condition. She was appreciative of the update and would like daily updates. Working toward d/c hopefully in the next few days when INR therapeutic.  Pixie Casino, MD, [facility] [place] Director of the Advanced Lipid Disorders &  Cardiovascular Risk Reduction Clinic Diplomate of the American Board of Clinical Lipidology Attending Cardiologist  Direct Dial: [phone]  Fax: [phone]  Website:  [email or web address]

## 2020-07-18 DIAGNOSIS — E1100 Type 2 diabetes mellitus with hyperosmolarity without nonketotic hyperglycemic-hyperosmolar coma (NKHHC): Secondary | ICD-10-CM | POA: Diagnosis not present

## 2020-07-18 DIAGNOSIS — I2111 ST elevation (STEMI) myocardial infarction involving right coronary artery: Secondary | ICD-10-CM | POA: Diagnosis not present

## 2020-07-18 DIAGNOSIS — N1832 Chronic kidney disease, stage 3b: Secondary | ICD-10-CM | POA: Diagnosis not present

## 2020-07-18 LAB — BASIC METABOLIC PANEL
Anion gap: 13 (ref 5–15)
BUN: 34 mg/dL — ABNORMAL HIGH (ref 8–23)
CO2: 25 mmol/L (ref 22–32)
Calcium: 8.9 mg/dL (ref 8.9–10.3)
Chloride: 100 mmol/L (ref 98–111)
Creatinine, Ser: 1.56 mg/dL — ABNORMAL HIGH (ref 0.44–1.00)
GFR calc Af Amer: 36 mL/min — ABNORMAL LOW (ref >60)
GFR calc non Af Amer: 31 mL/min — ABNORMAL LOW (ref >60)
Glucose, Bld: 194 mg/dL — ABNORMAL HIGH (ref 70–99)
Potassium: 4 mmol/L (ref 3.5–5.1)
Sodium: 138 mmol/L (ref 135–145)

## 2020-07-18 LAB — GLUCOSE, CAPILLARY
Glucose-Capillary: 184 mg/dL — ABNORMAL HIGH (ref 70–99)
Glucose-Capillary: 218 mg/dL — ABNORMAL HIGH (ref 70–99)
Glucose-Capillary: 175 mg/dL — ABNORMAL HIGH (ref 70–99)
Glucose-Capillary: 188 mg/dL — ABNORMAL HIGH (ref 70–99)

## 2020-07-18 LAB — PROTIME-INR
INR: 1.5 — ABNORMAL HIGH (ref 0.8–1.2)
Prothrombin Time: 17.7 seconds — ABNORMAL HIGH (ref 11.4–15.2)

## 2020-07-18 MED ORDER — WARFARIN SODIUM 10 MG PO TABS
10.0000 mg | ORAL_TABLET | Freq: Once | ORAL | Status: AC
  Administered 2020-07-18: 10 mg via ORAL
  Filled 2020-07-18: qty 1

## 2020-07-18 MED ORDER — ENOXAPARIN SODIUM 40 MG/0.4ML ~~LOC~~ SOLN
40.0000 mg | SUBCUTANEOUS | Status: DC
  Administered 2020-07-19: 40 mg via SUBCUTANEOUS
  Filled 2020-07-18: qty 0.4

## 2020-07-18 NOTE — Progress Notes (Signed)
O2 stats 88 on room air HS placed on 2L humidified air due to dry nasal tissue. Will continue to monitor. Arthor Captain LPN

## 2020-07-18 NOTE — Progress Notes (Signed)
CSW spoke with pt's daughter [name], spoke about dc options for pt, went over SNF vs HH. Pam will speak to her brothers and call CSW back with what they feel would be best for pt. CSW will continue to follow.

## 2020-07-18 NOTE — Progress Notes (Signed)
Patient's oxygen saturations dropped down to 77% while sleeping. Placed on 2L [place]; saturations went up to 96%.

## 2020-07-18 NOTE — Plan of Care (Signed)
  Problem: Education: Goal: Understanding of cardiac disease, CV risk reduction, and recovery process will improve Outcome: Progressing Goal: Understanding of medication regimen will improve Outcome: Progressing Goal: Individualized Educational Video(s) Outcome: Progressing   Problem: Activity: Goal: Ability to tolerate increased activity will improve Outcome: Progressing   Problem: Cardiac: Goal: Ability to achieve and maintain adequate cardiopulmonary perfusion will improve Outcome: Progressing Goal: Vascular access site(s) Level 0-1 will be maintained Outcome: Completed/Met   Problem: Health Behavior/Discharge Planning: Goal: Ability to safely manage health-related needs after discharge will improve Outcome: Progressing   Problem: Education: Goal: Knowledge of General Education information will improve Description: Including pain rating scale, medication(s)/side effects and non-pharmacologic comfort measures Outcome: Progressing   Problem: Clinical Measurements: Goal: Ability to maintain clinical measurements within normal limits will improve Outcome: Progressing Goal: Will remain free from infection Outcome: Progressing Goal: Diagnostic test results will improve Outcome: Progressing Goal: Respiratory complications will improve Outcome: Progressing Goal: Cardiovascular complication will be avoided Outcome: Progressing   Problem: Activity: Goal: Risk for activity intolerance will decrease Outcome: Progressing   Problem: Elimination: Goal: Will not experience complications related to bowel motility Outcome: Progressing Goal: Will not experience complications related to urinary retention Outcome: Progressing   Problem: Elimination: Goal: Will not experience complications related to urinary retention Outcome: Progressing   Problem: Pain Managment: Goal: General experience of comfort will improve Outcome: Progressing   Problem: Safety: Goal: Ability to remain  free from injury will improve Outcome: Progressing

## 2020-07-18 NOTE — Progress Notes (Signed)
ANTICOAGULATION CONSULT NOTE  Pharmacy Consult for warfarin Indication: atrial fibrillation  No Known Allergies  Patient Measurements: Height: 5' 5.5" (166.4 cm) Weight: 86.6 kg (190 lb 14.7 oz) IBW/kg (Calculated) : 58.15  Vital Signs: Temp: 98.2 F (36.8 C) (10/05 0945) Temp Source: Oral (10/05 0945) BP: 134/59 (10/05 0945) Pulse Rate: 89 (10/05 0945)  Labs: Recent Labs    07/16/20 0330 07/17/20 0502 07/18/20 0425  HGB 9.6*  --   --   HCT 30.5*  --   --   PLT 249  --   --   LABPROT 14.4 17.0* 17.7*  INR 1.2 1.4* 1.5*  CREATININE 1.57* 1.82* 1.56*    Estimated Creatinine Clearance: 31.6 mL/min (A) (by C-G formula based on SCr of 1.56 mg/dL (H)).   Assessment: 35 yof who presenting with question ST elevation, which resolved in setting of acute hypoxic resp failure likely secondary to pulm edema. On warfarin PTA - admission INR 1 (unknown timing of last dose). Was on heparin prior to cath where received DES to RCA. Patient to continue triple therapy with asa/plavix/warfarin at least 1 month. PTA regimen 5 mg daily except 7.5 mg qSat  INR trended up slightly to 1.5. We will go up on the dose today. CrCl>30 ml/min>>adjust lovenox to 40mg  qday.   Goal of Therapy:  INR 2-3 Monitor platelets by anticoagulation protocol: Yes   Plan:  Warfarin 10 mg tonight Lovenox 40mg  until INR >2 Monitor INR, CBC, and for s/sx of bleeding   Onnie Boer, PharmD, BCIDP, AAHIVP, CPP Infectious Disease Pharmacist 07/18/2020 10:05 AM

## 2020-07-18 NOTE — Progress Notes (Signed)
CARDIAC REHAB PHASE I   PRE:  Rate/Rhythm: 88 SR    BP: sitting 101/60    SaO2: 100 2 1/2 L, 95 RA  MODE:  Ambulation: 130 ft   POST:  Rate/Rhythm: 95 SR    BP: sitting 96/61     SaO2: 95 RA  Pt in bed. Doesn't remember walking yesterday. D/c'd O2 for walking and tolerated well. Steady with RW. No c/o but tired after walk. To recliner. VSS although BP slightly lower. Notified RN that she is left off O2 in recliner.  [place], ACSM 07/18/2020 1:44 PM

## 2020-07-18 NOTE — Progress Notes (Signed)
CSW spoke with [name], daughter of pt, confirmed that she would like to use HH options and wants her mom to go home. Will refer to [facility] to follow up.

## 2020-07-18 NOTE — Progress Notes (Addendum)
Progress Note  Patient Name: [name] Date of Encounter: 07/18/2020  [facility] Cardiologist: Dorris Carnes, MD   Subjective   Some epistaxis overnight - adjusting warfarin to therapeutic INR. Able to wean to room air. Social work is working on [employer] vs [place] options.  Inpatient Medications    Scheduled Meds: . aspirin  81 mg Oral Daily  . atorvastatin  40 mg Oral Daily  . carvedilol  6.25 mg Oral BID WC  . Chlorhexidine Gluconate Cloth  6 each Topical Daily  . clopidogrel  75 mg Oral Q breakfast  . docusate sodium  100 mg Oral BID  . [START ON 07/19/2020] enoxaparin (LOVENOX) injection  40 mg Subcutaneous Q24H  . furosemide  40 mg Oral Daily  . insulin aspart  0-15 Units Subcutaneous TID WC  . insulin aspart  0-5 Units Subcutaneous QHS  . oxymetazoline  1 spray Each Nare BID  . polyethylene glycol  17 g Oral Daily  . sodium chloride flush  3 mL Intravenous Q12H  . sodium chloride flush  3 mL Intravenous Q12H  . warfarin  10 mg Oral ONCE-1600  . Warfarin - Pharmacist Dosing Inpatient   Does not apply q1600   Continuous Infusions: . sodium chloride    . sodium chloride    . sodium chloride     PRN Meds: sodium chloride, sodium chloride, acetaminophen, ondansetron (ZOFRAN) IV, sodium chloride flush, sodium chloride flush   Vital Signs    Vitals:   07/18/20 0553 07/18/20 0758 07/18/20 0945 07/18/20 1130  BP: (!) 159/73  (!) 134/59   Pulse:   89   Resp:      Temp: 98.7 F (37.1 C) 98.4 F (36.9 C) 98.2 F (36.8 C) 98.9 F (37.2 C)  TempSrc: Oral Oral Oral Oral  SpO2:      Weight: 86.6 kg     Height:        Intake/Output Summary (Last 24 hours) at 07/18/2020 1410 Last data filed at 07/18/2020 1300 Gross per 24 hour  Intake 717 ml  Output 2100 ml  Net -1383 ml   Last 3 Weights 07/18/2020 07/17/2020 07/16/2020  Weight (lbs) 190 lb 14.7 oz 182 lb 6.4 oz 188 lb 15 oz  Weight (kg) 86.6 kg 82.736 kg 85.7 kg      Telemetry    Sinus with PVCs- Personally  Reviewed  Physical Exam   GEN: No acute distress.   Neck: JVP flat Cardiac: RRR, 2/6 systolic murmur Respiratory: Mild bibasilar crackles. GI: Soft, nontender, non-distended; right groin with no hematoma  MS: No edema Neuro:  Nonfocal  Psych: Normal affect   Labs    High Sensitivity Troponin:   Recent Labs  Lab 07/14/20 0041 07/14/20 0212 07/14/20 0730 07/14/20 1147  TROPONINIHS 264* 315* 604* 3,485*      Chemistry Recent Labs  Lab 07/16/20 0330 07/17/20 0502 07/18/20 0425  NA 139 141 138  K 3.9 3.4* 4.0  CL 105 100 100  CO2 21* 30 25  GLUCOSE 210* 125* 194*  BUN 29* 33* 34*  CREATININE 1.57* 1.82* 1.56*  CALCIUM 9.0 8.7* 8.9  GFRNONAA 31* 26* 31*  GFRAA 36* 30* 36*  ANIONGAP 13 11 13      Hematology Recent Labs  Lab 07/14/20 0038 07/14/20 0428 07/14/20 1301 07/15/20 0100 07/16/20 0330  WBC 6.7  --   --  9.8 9.6  RBC 3.64*  --   --  3.30* 3.44*  HGB 10.0*   < > 9.9*  9.0* 9.6*  HCT 32.9*   < > 29.0* 29.5* 30.5*  MCV 90.4  --   --  89.4 88.7  MCH 27.5  --   --  27.3 27.9  MCHC 30.4  --   --  30.5 31.5  RDW 14.5  --   --  14.6 14.9  PLT 338  --   --  275 249   < > = values in this interval not displayed.    BNP Recent Labs  Lab 07/14/20 0212  BNP 397.2*     Radiology    No results found.  Patient Profile     79 y.o. female with a history of CAD(drug-eluting stent to mid RCA 2009,drug-eluting stent to mid LAD in 2006),hypertension, hyperlipidemia, PE reportedly on warfarin, admitted with inferior STEMI.  Echocardiogram shows septal hypokinesis with mild LV dysfunction and ejection fraction 45 to 50%, moderate left ventricular hypertrophy, grade 2 diastolic dysfunction, severe left atrial enlargement, mild to moderate aortic stenosis.  Cardiac catheterization revealed severe stenosis of the first diagonal, severe diffuse disease and small left circumflex, moderate stenosis in the ramus intermedius and subtotal occlusion of the RCA.  Patient  had PCI of the right coronary artery with drug-eluting stent.  Assessment & Plan    1 status post inferior MI with PCI of RCA-continue aspirin, Plavix, beta-blocker and statin.  Given need for long-term Coumadin with plan to continue aspirin and Plavix for 30 days and then discontinue aspirin and continue Plavix long-term.  2 mild to moderate aortic stenosis-patient will need follow-up echoes in the future.  3 status post flash pulmonary edema in the setting of inferior infarct requiring intubation-patient had episode of dyspnea this morning and chest x-ray showed pulmonary edema.  She was given IV Lasix and is now improving. 1.2L negative overnight - on oral lasix.  Creatinine improved to 1.56 today (from 1.82).  4 hypertension-blood pressure is increasing.  Discontinue nitroglycerin.  Conitnue carvedilol to 6.25 mg twice daily.  Follow regimen and adjust as needed.  5 history of pulmonary embolus-Coumadin has been resumed with goal INR 2-3. INR today 1.5- monitor until INR reaches 2.0  6 chronic stage III kidney disease-follow renal function closely with diuresis. Mild hypokalemia today - replete  7 hyperlipidemia-given documented coronary disease will increase Lipitor to 40 mg daily.  Check lipids and liver in 12 weeks.  8. Epistaxis - agree with humidified oxygen   9. DM2 - on SSI, restart metformin 1000 mg BID at discharge  Working toward [facility] with services as tolerated. Anticipate d/c possibly by Friday.  For questions or updates, please contact [place] Please consult [email or web address] for contact info under   Pixie Casino, MD, FACC, [place] Director of the Advanced Lipid Disorders &  Cardiovascular Risk Reduction Clinic Diplomate of the American Board of Clinical Lipidology Attending Cardiologist  Direct Dial: [phone]  Fax: [phone]  Website:  [email or web address]  Pixie Casino, MD  07/18/2020, 2:10 PM

## 2020-07-19 DIAGNOSIS — I10 Essential (primary) hypertension: Secondary | ICD-10-CM | POA: Diagnosis not present

## 2020-07-19 DIAGNOSIS — I2111 ST elevation (STEMI) myocardial infarction involving right coronary artery: Secondary | ICD-10-CM | POA: Diagnosis not present

## 2020-07-19 DIAGNOSIS — I5041 Acute combined systolic (congestive) and diastolic (congestive) heart failure: Secondary | ICD-10-CM | POA: Diagnosis not present

## 2020-07-19 LAB — PROTIME-INR
INR: 1.7 — ABNORMAL HIGH (ref 0.8–1.2)
Prothrombin Time: 19 seconds — ABNORMAL HIGH (ref 11.4–15.2)

## 2020-07-19 LAB — GLUCOSE, CAPILLARY
Glucose-Capillary: 139 mg/dL — ABNORMAL HIGH (ref 70–99)
Glucose-Capillary: 222 mg/dL — ABNORMAL HIGH (ref 70–99)
Glucose-Capillary: 150 mg/dL — ABNORMAL HIGH (ref 70–99)
Glucose-Capillary: 221 mg/dL — ABNORMAL HIGH (ref 70–99)

## 2020-07-19 LAB — BASIC METABOLIC PANEL
Anion gap: 12 (ref 5–15)
BUN: 37 mg/dL — ABNORMAL HIGH (ref 8–23)
CO2: 29 mmol/L (ref 22–32)
Calcium: 8.6 mg/dL — ABNORMAL LOW (ref 8.9–10.3)
Chloride: 98 mmol/L (ref 98–111)
Creatinine, Ser: 1.73 mg/dL — ABNORMAL HIGH (ref 0.44–1.00)
GFR calc non Af Amer: 27 mL/min — ABNORMAL LOW (ref >60)
Glucose, Bld: 299 mg/dL — ABNORMAL HIGH (ref 70–99)
Potassium: 3.9 mmol/L (ref 3.5–5.1)
Sodium: 139 mmol/L (ref 135–145)

## 2020-07-19 MED ORDER — ENOXAPARIN SODIUM 30 MG/0.3ML ~~LOC~~ SOLN
30.0000 mg | SUBCUTANEOUS | Status: DC
  Administered 2020-07-20 – 2020-07-22 (×3): 30 mg via SUBCUTANEOUS
  Filled 2020-07-19 (×3): qty 0.3

## 2020-07-19 MED ORDER — WARFARIN SODIUM 7.5 MG PO TABS
7.5000 mg | ORAL_TABLET | Freq: Once | ORAL | Status: AC
  Administered 2020-07-19: 7.5 mg via ORAL
  Filled 2020-07-19: qty 1

## 2020-07-19 NOTE — Progress Notes (Signed)
ANTICOAGULATION CONSULT NOTE  Pharmacy Consult for warfarin Indication: atrial fibrillation  No Known Allergies  Patient Measurements: Height: 5' 5.5" (166.4 cm) Weight: 81.4 kg (179 lb 6.4 oz) IBW/kg (Calculated) : 58.15  Vital Signs: Temp: 98.2 F (36.8 C) (10/06 0700) Temp Source: Oral (10/06 0700) BP: 128/59 (10/06 0700) Pulse Rate: 81 (10/06 0700)  Labs: Recent Labs    07/17/20 0502 07/18/20 0425 07/19/20 0942  LABPROT 17.0* 17.7* 19.0*  INR 1.4* 1.5* 1.7*  CREATININE 1.82* 1.56* 1.73*    Estimated Creatinine Clearance: 27.6 mL/min (A) (by C-G formula based on SCr of 1.73 mg/dL (H)).   Assessment: 53 yof who presenting with question ST elevation, which resolved in setting of acute hypoxic resp failure likely secondary to pulm edema. On warfarin PTA - admission INR 1 (unknown timing of last dose). Was on heparin prior to cath where received DES to RCA. Patient to continue triple therapy with asa/plavix/warfarin at least 1 month. PTA regimen 5 mg daily except 7.5 mg qSat  INR continues to rise steadily to 1.7 this morning. CrCl <30 so will reduce enoxaparin dosing. Epistaxis noted overnight.  Goal of Therapy:  INR 2-3 Monitor platelets by anticoagulation protocol: Yes   Plan:  Warfarin 7.5mg  PO x1 tonight Enoxaparin to 30mg  SQ daily Daily INR  Arrie Senate, PharmD, BCPS Clinical Pharmacist [phone] Please check AMION for all [facility] Pharmacy numbers 07/19/2020

## 2020-07-19 NOTE — Progress Notes (Signed)
CARDIAC REHAB PHASE I   PRE:  Rate/Rhythm: 75 SR    BP: sitting 118/64    SaO2: 99 2L, 96 RA  MODE:  Ambulation: 200 ft   POST:  Rate/Rhythm: 100 ST with PVCs    BP: sitting 135/52     SaO2: 97 RA  Pt able to move out of bed and walk in hall with RW. Slow and steady, no major c/o. To recliner after walk, no O2 needed. Pt with call bell.  [id number]   [place], ACSM 07/19/2020 11:57 AM

## 2020-07-19 NOTE — Plan of Care (Signed)
  Problem: Education: Goal: Understanding of cardiac disease, CV risk reduction, and recovery process will improve Outcome: Progressing Goal: Understanding of medication regimen will improve Outcome: Progressing   Problem: Activity: Goal: Ability to tolerate increased activity will improve Outcome: Progressing   Problem: Cardiac: Goal: Ability to achieve and maintain adequate cardiopulmonary perfusion will improve Outcome: Progressing

## 2020-07-19 NOTE — Progress Notes (Signed)
Progress Note  Patient Name: [name] Date of Encounter: 07/19/2020  [facility] Cardiologist: Dorris Carnes, MD   Subjective   Ms. Fialkowski was admitted with inferior STEMI and underwent PCI stenting by Dr. Burt Knack.  She is recuperated nicely.  She is ambulating in the hallway without limitation.  She is being recoumadinized for her previous pulmonary embolus.  Inpatient Medications    Scheduled Meds: . aspirin  81 mg Oral Daily  . atorvastatin  40 mg Oral Daily  . carvedilol  6.25 mg Oral BID WC  . Chlorhexidine Gluconate Cloth  6 each Topical Daily  . clopidogrel  75 mg Oral Q breakfast  . docusate sodium  100 mg Oral BID  . [START ON 07/20/2020] enoxaparin (LOVENOX) injection  30 mg Subcutaneous Q24H  . furosemide  40 mg Oral Daily  . insulin aspart  0-15 Units Subcutaneous TID WC  . insulin aspart  0-5 Units Subcutaneous QHS  . oxymetazoline  1 spray Each Nare BID  . polyethylene glycol  17 g Oral Daily  . sodium chloride flush  3 mL Intravenous Q12H  . sodium chloride flush  3 mL Intravenous Q12H  . warfarin  7.5 mg Oral ONCE-1600  . Warfarin - Pharmacist Dosing Inpatient   Does not apply q1600   Continuous Infusions: . sodium chloride    . sodium chloride    . sodium chloride     PRN Meds: sodium chloride, sodium chloride, acetaminophen, ondansetron (ZOFRAN) IV, sodium chloride flush, sodium chloride flush   Vital Signs    Vitals:   07/19/20 0432 07/19/20 0500 07/19/20 0700 07/19/20 1217  BP:  (!) 109/50 (!) 128/59 (!) 154/65  Pulse:  78 81 84  Resp:  17 16 18   Temp:  98.4 F (36.9 C) 98.2 F (36.8 C) 98.2 F (36.8 C)  TempSrc:  Oral Oral Oral  SpO2:  98% 93% 96%  Weight: 81.4 kg     Height:        Intake/Output Summary (Last 24 hours) at 07/19/2020 1230 Last data filed at 07/19/2020 0432 Gross per 24 hour  Intake 717 ml  Output 1050 ml  Net -333 ml   Last 3 Weights 07/19/2020 07/18/2020 07/17/2020  Weight (lbs) 179 lb 6.4 oz 190 lb 14.7 oz  182 lb 6.4 oz  Weight (kg) 81.375 kg 86.6 kg 82.736 kg      Telemetry    Sinus rhythm- Personally Reviewed  ECG    Not performed today- Personally Reviewed  Physical Exam   GEN: No acute distress.   Neck: No JVD Cardiac: RRR, no murmurs, rubs, or gallops.  Respiratory: Clear to auscultation bilaterally. GI: Soft, nontender, non-distended  MS: No edema; No deformity. Neuro:  Nonfocal  Psych: Normal affect   Labs    High Sensitivity Troponin:   Recent Labs  Lab 07/14/20 0041 07/14/20 0212 07/14/20 0730 07/14/20 1147  TROPONINIHS 264* 315* 604* 3,485*      Chemistry Recent Labs  Lab 07/16/20 0330 07/16/20 0330 07/17/20 0502 07/18/20 0425 07/19/20 0942  NA 139   < > 141 138 139  K 3.9   < > 3.4* 4.0 3.9  CL 105   < > 100 100 98  CO2 21*   < > 30 25 29   GLUCOSE 210*   < > 125* 194* 299*  BUN 29*   < > 33* 34* 37*  CREATININE 1.57*   < > 1.82* 1.56* 1.73*  CALCIUM 9.0   < > 8.7*  8.9 8.6*  GFRNONAA 31*   < > 26* 31* 27*  GFRAA 36*  --  30* 36*  --   ANIONGAP 13   < > 11 13 12    < > = values in this interval not displayed.     Hematology Recent Labs  Lab 07/14/20 0038 07/14/20 0428 07/14/20 1301 07/15/20 0100 07/16/20 0330  WBC 6.7  --   --  9.8 9.6  RBC 3.64*  --   --  3.30* 3.44*  HGB 10.0*   < > 9.9* 9.0* 9.6*  HCT 32.9*   < > 29.0* 29.5* 30.5*  MCV 90.4  --   --  89.4 88.7  MCH 27.5  --   --  27.3 27.9  MCHC 30.4  --   --  30.5 31.5  RDW 14.5  --   --  14.6 14.9  PLT 338  --   --  275 249   < > = values in this interval not displayed.    BNP Recent Labs  Lab 07/14/20 0212  BNP 397.2*     DDimer No results for input(s): DDIMER in the last 168 hours.   Radiology    No results found.  Cardiac Studies   2D echocardiogram (07/14/2020)  IMPRESSIONS    1. Septal hypokinesis with mild LV dysfunction.  2. Left ventricular ejection fraction, by estimation, is 45 to 50%. The  left ventricle has mildly decreased function. The left  ventricle  demonstrates regional wall motion abnormalities (see scoring  diagram/findings for description). There is moderate  left ventricular hypertrophy. Left ventricular diastolic parameters are  consistent with Grade II diastolic dysfunction (pseudonormalization).  Elevated left atrial pressure.  3. Right ventricular systolic function is normal. The right ventricular  size is normal.  4. Left atrial size was severely dilated.  5. The mitral valve is normal in structure. No evidence of mitral valve  regurgitation. No evidence of mitral stenosis. Severe mitral annular  calcification.  6. The aortic valve is tricuspid. Aorti 1 3 c valve regurgitation is not  visualized. Mild to moderate aortic valve stenosis.  7. The inferior vena cava is dilated in size with <50% respiratory  variability, suggesting right atrial pressure of 15 mmHg.  Cardiac catheterization/PCI and stent (07/14/2020)  Conclusion  1.  Multivessel coronary artery disease with patency of the left mainstem, patency of the LAD including the stented segment in that vessel, severe stenosis of the 1st diagonal branch of the LAD, severe diffuse stenosis of a small left circumflex, moderately severe stenosis of the ramus intermedius, and subtotal occlusion of the distal RCA 2.  Successful PCI of the RCA using a 3.0 x 26 mm resolute Onyx DES 3.  Normal LVEDP  Recommendations: Post MI medical therapy, once cangrelor infusion is completed would load with clopidogrel 600 mg x 1 and 75 mg daily.  Restart warfarin today as long as no further procedures planned.  Recommend medical therapy for residual CAD.  Coronary Diagrams  Diagnostic Dominance: Co-dominant  Intervention   Implants     Patient Profile     79 y.o.femalewith a history ofCAD(drug-eluting stent to mid RCA 2009,drug-eluting stent to mid LAD in 2006),hypertension, hyperlipidemia, PE reportedly on warfarin,admitted with inferior STEMI.   Echocardiogram shows septal hypokinesis with mild LV dysfunction and ejection fraction 45 to 50%, moderate left ventricular hypertrophy, grade 2 diastolic dysfunction, severe left atrial enlargement, mild to moderate aortic stenosis.  Cardiac catheterization revealed severe stenosis of the first diagonal, severe diffuse disease  and small left circumflex, moderate stenosis in the ramus intermedius and subtotal occlusion of the RCA.  Patient had PCI of the right coronary artery with drug-eluting stent.  So  Assessment & Plan    1: CAD-history of multiple prior stents admitted with inferior STEMI treated with PCI and drug-eluting stenting by Dr. Burt Knack.  Currently on DAPT as well as Coumadin for prior PE.  Plan triple therapy for 1 month after which aspirin can be discontinued.  2: Mild to moderate aortic stenosis-we will need serial 2D echoes to follow in the future  3: Flash pulmonary edema-required brief intubation.  I/os are -3 L.  Her lungs are clear on exam today.  She was diuresed.  Serum creatinine remains mildly elevated 1.7.  She is continue to get Lasix 40 mg a day.  She was on low-dose hydrochlorothiazide as an outpatient and can probably be transitioned back to this on discharge.  4: Essential hypertension-blood pressures well controlled on current medications.  5: History of pulmonary embolus-on Coumadin as outpatient.  INR currently is 1.7.  Will discharge most likely in the next 24 to 48 hours once INR is greater than or equal to 2  6: Hyperlipidemia-on increased dose of Lipitor with plans on following lipid liver profile in 12 weeks as outpatient.  7: DM2-on SSI, restart Metformin at discharge.  Patient's INR is 1.7.  Goal is 2.0.  Getting Coumadin anticoagulation for prior PE.  Hopefully ready for discharge in the next 24 to 48 hours.  Patient is ambulating in the hallway without limitation.  For questions or updates, please contact [place] Please consult [email or web address] for  contact info under        Signed, Quay Burow, MD  07/19/2020, 12:30 PM

## 2020-07-20 DIAGNOSIS — D508 Other iron deficiency anemias: Secondary | ICD-10-CM | POA: Diagnosis not present

## 2020-07-20 DIAGNOSIS — I2111 ST elevation (STEMI) myocardial infarction involving right coronary artery: Secondary | ICD-10-CM | POA: Diagnosis not present

## 2020-07-20 DIAGNOSIS — I5021 Acute systolic (congestive) heart failure: Secondary | ICD-10-CM | POA: Diagnosis not present

## 2020-07-20 LAB — RETICULOCYTES
Immature Retic Fract: 12 % (ref 2.3–15.9)
RBC.: 3.04 MIL/uL — ABNORMAL LOW (ref 3.87–5.11)
Retic Count, Absolute: 36.2 10*3/uL (ref 19.0–186.0)
Retic Ct Pct: 1.2 % (ref 0.4–3.1)

## 2020-07-20 LAB — PROTIME-INR
INR: 1.8 — ABNORMAL HIGH (ref 0.8–1.2)
Prothrombin Time: 19.9 seconds — ABNORMAL HIGH (ref 11.4–15.2)

## 2020-07-20 LAB — GLUCOSE, CAPILLARY
Glucose-Capillary: 182 mg/dL — ABNORMAL HIGH (ref 70–99)
Glucose-Capillary: 228 mg/dL — ABNORMAL HIGH (ref 70–99)
Glucose-Capillary: 242 mg/dL — ABNORMAL HIGH (ref 70–99)
Glucose-Capillary: 285 mg/dL — ABNORMAL HIGH (ref 70–99)
Glucose-Capillary: 61 mg/dL — ABNORMAL LOW (ref 70–99)

## 2020-07-20 LAB — CBC
HCT: 28.5 % — ABNORMAL LOW (ref 36.0–46.0)
Hemoglobin: 8.8 g/dL — ABNORMAL LOW (ref 12.0–15.0)
MCH: 27.2 pg (ref 26.0–34.0)
MCHC: 30.9 g/dL (ref 30.0–36.0)
MCV: 88.2 fL (ref 80.0–100.0)
Platelets: 321 10*3/uL (ref 150–400)
RBC: 3.23 MIL/uL — ABNORMAL LOW (ref 3.87–5.11)
RDW: 14.3 % (ref 11.5–15.5)
WBC: 5 10*3/uL (ref 4.0–10.5)
nRBC: 0 % (ref 0.0–0.2)

## 2020-07-20 LAB — IRON AND TIBC
Iron: 21 ug/dL — ABNORMAL LOW (ref 28–170)
Saturation Ratios: 6 % — ABNORMAL LOW (ref 10.4–31.8)
TIBC: 336 ug/dL (ref 250–450)
UIBC: 315 ug/dL

## 2020-07-20 LAB — FERRITIN: Ferritin: 38 ng/mL (ref 11–307)

## 2020-07-20 LAB — FOLATE: Folate: 42.8 ng/mL (ref >5.9)

## 2020-07-20 LAB — VITAMIN B12: Vitamin B-12: 1381 pg/mL — ABNORMAL HIGH (ref 180–914)

## 2020-07-20 MED ORDER — WARFARIN SODIUM 10 MG PO TABS
10.0000 mg | ORAL_TABLET | Freq: Once | ORAL | Status: AC
  Administered 2020-07-20: 10 mg via ORAL
  Filled 2020-07-20: qty 1

## 2020-07-20 MED ORDER — INSULIN ASPART 100 UNIT/ML ~~LOC~~ SOLN
3.0000 [IU] | Freq: Three times a day (TID) | SUBCUTANEOUS | Status: DC
  Administered 2020-07-21 – 2020-07-22 (×4): 3 [IU] via SUBCUTANEOUS

## 2020-07-20 NOTE — Care Management Important Message (Signed)
Important Message  Patient Details  Name: MACAIAH MANGAL MRN: [id number] Date of Birth: 02/28/1941   Medicare Important Message Given:  Yes     Shelda Altes 07/20/2020, 10:35 AM

## 2020-07-20 NOTE — Plan of Care (Signed)
  Problem: Activity: Goal: Ability to tolerate increased activity will improve Outcome: Progressing   Problem: Cardiac: Goal: Ability to achieve and maintain adequate cardiopulmonary perfusion will improve Outcome: Progressing   Problem: Clinical Measurements: Goal: Respiratory complications will improve Outcome: Progressing Goal: Cardiovascular complication will be avoided Outcome: Progressing   

## 2020-07-20 NOTE — Progress Notes (Signed)
CARDIAC REHAB PHASE I   PRE:  Rate/Rhythm: 74 SR  BP:  Supine: 112/45  Sitting:   Standing:    SaO2: 98% 3L     94%RA MODE:  Ambulation: 300 ft   POST:  Rate/Rhythm: 87 SR  BP:  Supine:   Sitting: 127/49  Standing:    SaO2: 96%RA 1027-1125 Pt walked 300 ft on RA with gait belt use, rolling walker and asst x 1 with steady gait. No complaints. No DOE noted. Tolerated well. Left off oxygen and to recliner with chair alarm. Notified NT that pt in chair, off oxygen and purewick d/ced.  Left message at desk for RN to notify pt off oxygen and in chair.   Graylon Good, RN BSN  07/20/2020 11:21 AM

## 2020-07-20 NOTE — Plan of Care (Signed)
  Problem: Activity: Goal: Risk for activity intolerance will decrease Outcome: Progressing   

## 2020-07-20 NOTE — Progress Notes (Signed)
Progress Note  Patient Name: [name] Date of Encounter: 07/20/2020  Primary Cardiologist: Dorris Carnes, MD   Subjective   Feeling well this morning. No complaints of chest pain or SOB. She ambulated in the hallway a couple times yesterday without difficulty. Has noticed some blood in the tissue when blowing her nose which she attributes to the O2 via [place]. No significant epistaxis. No complaints of hematuria, hematochezia, or melena, though does think stools may be a bit darker recently.   Inpatient Medications    Scheduled Meds: . aspirin  81 mg Oral Daily  . atorvastatin  40 mg Oral Daily  . carvedilol  6.25 mg Oral BID WC  . Chlorhexidine Gluconate Cloth  6 each Topical Daily  . clopidogrel  75 mg Oral Q breakfast  . docusate sodium  100 mg Oral BID  . enoxaparin (LOVENOX) injection  30 mg Subcutaneous Q24H  . furosemide  40 mg Oral Daily  . insulin aspart  0-15 Units Subcutaneous TID WC  . insulin aspart  0-5 Units Subcutaneous QHS  . oxymetazoline  1 spray Each Nare BID  . polyethylene glycol  17 g Oral Daily  . sodium chloride flush  3 mL Intravenous Q12H  . sodium chloride flush  3 mL Intravenous Q12H  . Warfarin - Pharmacist Dosing Inpatient   Does not apply q1600   Continuous Infusions: . sodium chloride    . sodium chloride    . sodium chloride     PRN Meds: sodium chloride, sodium chloride, acetaminophen, ondansetron (ZOFRAN) IV, sodium chloride flush, sodium chloride flush   Vital Signs    Vitals:   07/20/20 0011 07/20/20 0334 07/20/20 0802 07/20/20 0900  BP: 119/76 136/70 131/61 131/61  Pulse: 83 84 77 78  Resp: 16  19 19   Temp: 97.9 F (36.6 C) 97.9 F (36.6 C) 98.5 F (36.9 C) 98.5 F (36.9 C)  TempSrc: Oral Oral Oral Oral  SpO2: 100% 96% 95% 100%  Weight:  82.3 kg    Height:        Intake/Output Summary (Last 24 hours) at 07/20/2020 1005 Last data filed at 07/20/2020 0949 Gross per 24 hour  Intake 1060 ml  Output 900 ml  Net 160 ml    Filed Weights   07/18/20 0553 07/19/20 0432 07/20/20 0334  Weight: 86.6 kg 81.4 kg 82.3 kg    Telemetry    Sinus rhythm with occasional PVC - Personally Reviewed  ECG    No new tracings - Personally Reviewed  Physical Exam   GEN: No acute distress.   Neck: No JVD, no carotid bruits Cardiac: RRR, no murmurs, rubs, or gallops.  Respiratory: Clear to auscultation bilaterally, no wheezes/ rales/ rhonchi GI: NABS, Soft, obese, nontender, non-distended  MS: No edema; No deformity. Neuro:  Nonfocal, moving all extremities spontaneously Psych: Normal affect   Labs    Chemistry Recent Labs  Lab 07/16/20 0330 07/16/20 0330 07/17/20 0502 07/18/20 0425 07/19/20 0942  NA 139   < > 141 138 139  K 3.9   < > 3.4* 4.0 3.9  CL 105   < > 100 100 98  CO2 21*   < > 30 25 29   GLUCOSE 210*   < > 125* 194* 299*  BUN 29*   < > 33* 34* 37*  CREATININE 1.57*   < > 1.82* 1.56* 1.73*  CALCIUM 9.0   < > 8.7* 8.9 8.6*  GFRNONAA 31*   < > 26* 31* 27*  GFRAA 36*  --  30* 36*  --   ANIONGAP 13   < > 11 13 12    < > = values in this interval not displayed.     Hematology Recent Labs  Lab 07/15/20 0100 07/16/20 0330 07/20/20 0622  WBC 9.8 9.6 5.0  RBC 3.30* 3.44* 3.23*  HGB 9.0* 9.6* 8.8*  HCT 29.5* 30.5* 28.5*  MCV 89.4 88.7 88.2  MCH 27.3 27.9 27.2  MCHC 30.5 31.5 30.9  RDW 14.6 14.9 14.3  PLT 275 249 321    Cardiac EnzymesNo results for input(s): TROPONINI in the last 168 hours. No results for input(s): TROPIPOC in the last 168 hours.   BNP Recent Labs  Lab 07/14/20 0212  BNP 397.2*     DDimer No results for input(s): DDIMER in the last 168 hours.   Radiology    No results found.  Cardiac Studies   2D echocardiogram (07/14/2020)  IMPRESSIONS    1. Septal hypokinesis with mild LV dysfunction.  2. Left ventricular ejection fraction, by estimation, is 45 to 50%. The  left ventricle has mildly decreased function. The left ventricle  demonstrates regional wall  motion abnormalities (see scoring  diagram/findings for description). There is moderate  left ventricular hypertrophy. Left ventricular diastolic parameters are  consistent with Grade II diastolic dysfunction (pseudonormalization).  Elevated left atrial pressure.  3. Right ventricular systolic function is normal. The right ventricular  size is normal.  4. Left atrial size was severely dilated.  5. The mitral valve is normal in structure. No evidence of mitral valve  regurgitation. No evidence of mitral stenosis. Severe mitral annular  calcification.  6. The aortic valve is tricuspid. Aorti 1 3 c valve regurgitation is not  visualized. Mild to moderate aortic valve stenosis.  7. The inferior vena cava is dilated in size with <50% respiratory  variability, suggesting right atrial pressure of 15 mmHg.  Cardiac catheterization/PCI and stent (07/14/2020)  Conclusion  1. Multivessel coronary artery disease with patency of the left mainstem, patency of the LAD including the stented segment in that vessel, severe stenosis of the 1st diagonal branch of the LAD, severe diffuse stenosis of a small left circumflex, moderately severe stenosis of the ramus intermedius, and subtotal occlusion of the distal RCA 2. Successful PCI of the RCA using a 3.0 x 26 mm resolute Onyx DES 3. Normal LVEDP  Recommendations: Post MI medical therapy, once cangrelor infusion is completed would load with clopidogrel 600 mg x 1 and 75 mg daily. Restart warfarin today as long as no further procedures planned. Recommend medical therapy for residual CAD.  Coronary Diagrams  Diagnostic Dominance: Co-dominant  Intervention     Patient Profile     79 y.o.femalewith a history ofCAD(drug-eluting stent to mid RCA 2009,drug-eluting stent to mid LAD in 2006),hypertension, hyperlipidemia, PE reportedly on warfarin,admitted with inferior STEMI. Echocardiogram shows septal hypokinesis with mild LV  dysfunction and ejection fraction 45 to 50%, moderate left ventricular hypertrophy, grade 2 diastolic dysfunction, severe left atrial enlargement, mild to moderate aortic stenosis. Cardiac catheterization revealed severe stenosis of the first diagonal, severe diffuse disease and small left circumflex, moderate stenosis in the ramus intermedius and subtotal occlusion of the RCA. Patient had PCI of the right coronary artery with drug-eluting stent.  Assessment & Plan    1. CAD s/p STEMI with PCI/DES to RCA 07/14/20 with prior PCI/DES to LAD in 2006, RCA in 2009: patient was admitted with an inferior STEMI with PCI/DES to RCA. Recommended for triple  therapy with aspirin, plavix, and coumadin for 1 month, then plavix and coumadin going forward. No recurrent chest pain.  - Continue aspirin and plavix - anticipate stopping aspirin 08/14/20 - Continue statin - Continue carvedilol  2. Acute on chronic anemia: Hgb 8.8 this morning, down from 9.6 yesterday. Baseline appears to be 10-11. No overt bleeding though she does think her stools may have been a bit darker.  - Will check an anemia panel - Check FOBT - Will follow Hgb closely given need for triple therapy  3. Flash pulmonary edema: required brief intubation. She has diuresed well.  UOP is +467mL in the past 24 hours though net -2.5L this admission. Home HCTZ held and she has been continued on lasix 40mg  daily. Cr stable - generally in the 1.5-1.7 range.  - Continue po lasix - Continue to monitor strict I&Os and daily weights  4. HTN: BP is stable - Continue carvedilol and lasix  5. HLD: LDL 91 this admission and atorvastatin was increased - Continue atorvastatin - Will need repeat FLP/LFTs in 6-8 weeks for monitoring  6. Aortic stenosis: mild-moderate on echo this admission - Continue routine monitoring outpatient  7. DM type 2: A1C 7.3 this admission; goal is <7. Metformin on hold this admission - Continue ISS.  - Resume metformin at  discharge  8. History of PE: coumadin resumed following LHC. INR 1.8 today; goal 2-3 - Continue coumadin per pharmacy to reach goal INR 2-3    For questions or updates, please contact [place] Please consult [email or web address] for contact info under Cardiology/STEMI.      Signed, Abigail Butts, PA-C  07/20/2020, 10:05 AM   [phone]

## 2020-07-20 NOTE — Progress Notes (Signed)
Her CBGs have been trending up. Ok to add meals coverage per cards team (Dr Debara Pickett and Roby Lofts, [place])  [place], PharmD, [place], AAHIVP, CPP Infectious Disease Pharmacist 07/20/2020 10:46 AM

## 2020-07-21 DIAGNOSIS — I10 Essential (primary) hypertension: Secondary | ICD-10-CM | POA: Diagnosis not present

## 2020-07-21 DIAGNOSIS — I5021 Acute systolic (congestive) heart failure: Secondary | ICD-10-CM | POA: Diagnosis not present

## 2020-07-21 DIAGNOSIS — D508 Other iron deficiency anemias: Secondary | ICD-10-CM | POA: Diagnosis not present

## 2020-07-21 DIAGNOSIS — I2111 ST elevation (STEMI) myocardial infarction involving right coronary artery: Secondary | ICD-10-CM | POA: Diagnosis not present

## 2020-07-21 LAB — PROTIME-INR
INR: 1.9 — ABNORMAL HIGH (ref 0.8–1.2)
Prothrombin Time: 20.7 seconds — ABNORMAL HIGH (ref 11.4–15.2)

## 2020-07-21 LAB — GLUCOSE, CAPILLARY
Glucose-Capillary: 134 mg/dL — ABNORMAL HIGH (ref 70–99)
Glucose-Capillary: 305 mg/dL — ABNORMAL HIGH (ref 70–99)
Glucose-Capillary: 97 mg/dL (ref 70–99)
Glucose-Capillary: 257 mg/dL — ABNORMAL HIGH (ref 70–99)

## 2020-07-21 MED ORDER — WARFARIN SODIUM 10 MG PO TABS
10.0000 mg | ORAL_TABLET | Freq: Once | ORAL | Status: AC
  Administered 2020-07-21: 10 mg via ORAL
  Filled 2020-07-21: qty 1

## 2020-07-21 MED ORDER — POLYSACCHARIDE IRON COMPLEX 150 MG PO CAPS
150.0000 mg | ORAL_CAPSULE | Freq: Every day | ORAL | Status: DC
  Administered 2020-07-21 – 2020-07-22 (×2): 150 mg via ORAL
  Filled 2020-07-21 (×2): qty 1

## 2020-07-21 NOTE — Progress Notes (Signed)
Progress Note  Patient Name: [name] Date of Encounter: 07/21/2020  Primary Cardiologist: Dorris Carnes, MD   Subjective   No issues overnight. INR up to 1.9 today. Iron studies show low iron at 21 with trans sat of 6%. No CBC today.  Inpatient Medications    Scheduled Meds: . aspirin  81 mg Oral Daily  . atorvastatin  40 mg Oral Daily  . carvedilol  6.25 mg Oral BID WC  . Chlorhexidine Gluconate Cloth  6 each Topical Daily  . clopidogrel  75 mg Oral Q breakfast  . docusate sodium  100 mg Oral BID  . enoxaparin (LOVENOX) injection  30 mg Subcutaneous Q24H  . furosemide  40 mg Oral Daily  . insulin aspart  0-15 Units Subcutaneous TID WC  . insulin aspart  0-5 Units Subcutaneous QHS  . insulin aspart  3 Units Subcutaneous TID WC  . oxymetazoline  1 spray Each Nare BID  . polyethylene glycol  17 g Oral Daily  . sodium chloride flush  3 mL Intravenous Q12H  . sodium chloride flush  3 mL Intravenous Q12H  . Warfarin - Pharmacist Dosing Inpatient   Does not apply q1600   Continuous Infusions: . sodium chloride    . sodium chloride    . sodium chloride     PRN Meds: sodium chloride, sodium chloride, acetaminophen, ondansetron (ZOFRAN) IV, sodium chloride flush, sodium chloride flush   Vital Signs    Vitals:   07/20/20 1950 07/21/20 0433 07/21/20 0736 07/21/20 0805  BP: (!) 138/53 (!) 128/52  (!) 102/51  Pulse: 81 76 85 73  Resp: 18 16 (!) 29 18  Temp: 98.3 F (36.8 C) 98.4 F (36.9 C)  98.1 F (36.7 C)  TempSrc: Oral Oral  Oral  SpO2: 98% 97% 98% 97%  Weight:  81.6 kg    Height:        Intake/Output Summary (Last 24 hours) at 07/21/2020 0930 Last data filed at 07/20/2020 1915 Gross per 24 hour  Intake 480 ml  Output 1150 ml  Net -670 ml   Filed Weights   07/19/20 0432 07/20/20 0334 07/21/20 0433  Weight: 81.4 kg 82.3 kg 81.6 kg    Telemetry    Sinus rhythm  - Personally Reviewed  ECG    No new tracings - Personally Reviewed  Physical Exam    GEN: No acute distress.   Neck: No JVD, no carotid bruits Cardiac: RRR, no murmurs, rubs, or gallops.  Respiratory: Clear to auscultation bilaterally, no wheezes/ rales/ rhonchi GI: NABS, Soft, obese, nontender, non-distended  MS: No edema; No deformity. Neuro:  Nonfocal, moving all extremities spontaneously Psych: Normal affect   Labs    Chemistry Recent Labs  Lab 07/16/20 0330 07/16/20 0330 07/17/20 0502 07/18/20 0425 07/19/20 0942  NA 139   < > 141 138 139  K 3.9   < > 3.4* 4.0 3.9  CL 105   < > 100 100 98  CO2 21*   < > 30 25 29   GLUCOSE 210*   < > 125* 194* 299*  BUN 29*   < > 33* 34* 37*  CREATININE 1.57*   < > 1.82* 1.56* 1.73*  CALCIUM 9.0   < > 8.7* 8.9 8.6*  GFRNONAA 31*   < > 26* 31* 27*  GFRAA 36*  --  30* 36*  --   ANIONGAP 13   < > 11 13 12    < > = values in this interval not  displayed.     Hematology Recent Labs  Lab 07/15/20 0100 07/15/20 0100 07/16/20 0330 07/20/20 0622 07/20/20 1043  WBC 9.8  --  9.6 5.0  --   RBC 3.30*   < > 3.44* 3.23* 3.04*  HGB 9.0*  --  9.6* 8.8*  --   HCT 29.5*  --  30.5* 28.5*  --   MCV 89.4  --  88.7 88.2  --   MCH 27.3  --  27.9 27.2  --   MCHC 30.5  --  31.5 30.9  --   RDW 14.6  --  14.9 14.3  --   PLT 275  --  249 321  --    < > = values in this interval not displayed.    Cardiac EnzymesNo results for input(s): TROPONINI in the last 168 hours. No results for input(s): TROPIPOC in the last 168 hours.   BNP No results for input(s): BNP, PROBNP in the last 168 hours.   DDimer No results for input(s): DDIMER in the last 168 hours.   Radiology    No results found.  Cardiac Studies   2D echocardiogram (07/14/2020)  IMPRESSIONS    1. Septal hypokinesis with mild LV dysfunction.  2. Left ventricular ejection fraction, by estimation, is 45 to 50%. The  left ventricle has mildly decreased function. The left ventricle  demonstrates regional wall motion abnormalities (see scoring  diagram/findings for  description). There is moderate  left ventricular hypertrophy. Left ventricular diastolic parameters are  consistent with Grade II diastolic dysfunction (pseudonormalization).  Elevated left atrial pressure.  3. Right ventricular systolic function is normal. The right ventricular  size is normal.  4. Left atrial size was severely dilated.  5. The mitral valve is normal in structure. No evidence of mitral valve  regurgitation. No evidence of mitral stenosis. Severe mitral annular  calcification.  6. The aortic valve is tricuspid. Aorti 1 3 c valve regurgitation is not  visualized. Mild to moderate aortic valve stenosis.  7. The inferior vena cava is dilated in size with <50% respiratory  variability, suggesting right atrial pressure of 15 mmHg.  Cardiac catheterization/PCI and stent (07/14/2020)  Conclusion  1. Multivessel coronary artery disease with patency of the left mainstem, patency of the LAD including the stented segment in that vessel, severe stenosis of the 1st diagonal branch of the LAD, severe diffuse stenosis of a small left circumflex, moderately severe stenosis of the ramus intermedius, and subtotal occlusion of the distal RCA 2. Successful PCI of the RCA using a 3.0 x 26 mm resolute Onyx DES 3. Normal LVEDP  Recommendations: Post MI medical therapy, once cangrelor infusion is completed would load with clopidogrel 600 mg x 1 and 75 mg daily. Restart warfarin today as long as no further procedures planned. Recommend medical therapy for residual CAD.  Coronary Diagrams  Diagnostic Dominance: Co-dominant  Intervention     Patient Profile     79 y.o.femalewith a history ofCAD(drug-eluting stent to mid RCA 2009,drug-eluting stent to mid LAD in 2006),hypertension, hyperlipidemia, PE reportedly on warfarin,admitted with inferior STEMI. Echocardiogram shows septal hypokinesis with mild LV dysfunction and ejection fraction 45 to 50%, moderate left  ventricular hypertrophy, grade 2 diastolic dysfunction, severe left atrial enlargement, mild to moderate aortic stenosis. Cardiac catheterization revealed severe stenosis of the first diagonal, severe diffuse disease and small left circumflex, moderate stenosis in the ramus intermedius and subtotal occlusion of the RCA. Patient had PCI of the right coronary artery with drug-eluting stent.  Assessment &  Plan    1. CAD s/p STEMI with PCI/DES to RCA 07/14/20 with prior PCI/DES to LAD in 2006, RCA in 2009: patient was admitted with an inferior STEMI with PCI/DES to RCA. Recommended for triple therapy with aspirin, plavix, and coumadin for 1 month, then plavix and coumadin going forward. No recurrent chest pain.  - Continue aspirin and plavix - anticipate stopping aspirin 08/14/20 - Continue statin - Continue carvedilol  2. Acute on chronic anemia: Hgb 8.8 this morning, down from 9.6 yesterday. Baseline appears to be 10-11. No overt bleeding though she does think her stools may have been a bit darker.  - Anemia panel suggests mild iron deficiency - Check FOBT - pending - Will follow Hgb closely given need for triple therapy - Start NuIron 150 mg daily  3. Flash pulmonary edema: required brief intubation. She has diuresed well.  UOP is +431mL in the past 24 hours though net -2.5L this admission. Home HCTZ held and she has been continued on lasix 40mg  daily. Cr stable - generally in the 1.5-1.7 range.  - Continue po lasix - Continue to monitor strict I&Os and daily weights - Noctural hypoxia noted - recommend outpatient overnight oximetry - Start IS during the daytime  4. HTN: BP is stable - Continue carvedilol and lasix  5. HLD: LDL 91 this admission and atorvastatin was increased - Continue atorvastatin - Will need repeat FLP/LFTs in 6-8 weeks for monitoring  6. Aortic stenosis: mild-moderate on echo this admission - Continue routine monitoring outpatient  7. DM type 2: A1C 7.3 this  admission; goal is <7. Metformin on hold this admission - Continue ISS.  - Resume metformin at discharge  8. History of PE: coumadin resumed following LHC. INR 1.8 today; goal 2-3 - Continue coumadin per pharmacy to reach goal INR 2-3  9. Sacral redness- daughter was concerned about decubitis - this was evaluated with nursing, there is some mild redness, but no evidence of induration, early ulceration or development of decubitus. The patient has been ambulating and changing position.  Anticipate d/c home tomorrow if INR 2.0 and hemoglobin stable without s/s of bleeding.  For questions or updates, please contact [place] Please consult [email or web address] for contact info under Cardiology/STEMI.      Pixie Casino, MD, [facility], [place] Director of the [facility] Lipid Disorders &  Cardiovascular Risk Reduction Clinic Diplomate of the American Board of Clinical Lipidology Attending Cardiologist  Direct Dial: [phone]  Fax: [phone]  Website:  [email or web address]  Pixie Casino, MD  07/21/2020, 9:30 AM

## 2020-07-21 NOTE — Progress Notes (Signed)
CARDIAC REHAB PHASE I   PRE:  Rate/Rhythm: 76 SR    BP: sitting 121/56    SaO2: 96 RA  MODE:  Ambulation: 400 ft   POST:  Rate/Rhythm: 104 ST    BP: sitting 135/59     SaO2: 98 RA  Pt feeling well. Able to increase distance today, no c/o. To recliner, on chair alarm.  [place], ACSM 07/21/2020 10:44 AM

## 2020-07-21 NOTE — Progress Notes (Signed)
Inpatient Diabetes Program Recommendations  AACE/ADA: New Consensus Statement on Inpatient Glycemic Control (2015)  Target Ranges:  Prepandial:   less than 140 mg/dL      Peak postprandial:   less than 180 mg/dL (1-2 hours)      Critically ill patients:  140 - 180 mg/dL   Lab Results  Component Value Date   GLUCAP 134 (H) 07/21/2020   HGBA1C 7.3 (H) 07/14/2020    Review of Glycemic Control Results for [name], [name] (MRN [id number]) as of 07/21/2020 09:48  Ref. Range 07/20/2020 11:20 07/20/2020 16:48 07/20/2020 20:54 07/21/2020 06:04  Glucose-Capillary Latest Ref Range: 70 - 99 mg/dL 242 (H) 61 (L) 228 (H) 134 (H)   Diabetes history: Type 2 DM Outpatient Diabetes medications: Metformin 1000 mg BID Current orders for Inpatient glycemic control: Novolog 3 units TID, Novolog 0-15 units TID, Novolog 0-5 units QHS  Inpatient Diabetes Program Recommendations:    Noted mild hypoglycemia yesterday of 61 mg/dL, however, correction was given late following breakfast and correction for lunch was given 2 hours following previous CBG. Continue with current orders.   Of note, with current GFR and renal status would be hesitant on resume Metformin at discharge.   Thanks, Bronson Curb, MSN, RNC-OB Diabetes Coordinator [phone] (8a-5p)

## 2020-07-21 NOTE — Progress Notes (Signed)
ANTICOAGULATION CONSULT NOTE  Pharmacy Consult for warfarin Indication: atrial fibrillation  No Known Allergies  Patient Measurements: Height: 5' 5.5" (166.4 cm) Weight: 81.6 kg (179 lb 14.4 oz) (scale b) IBW/kg (Calculated) : 58.15  Vital Signs: Temp: 98.1 F (36.7 C) (10/08 0805) Temp Source: Oral (10/08 0805) BP: 124/52 (10/08 1229) Pulse Rate: 76 (10/08 1229)  Labs: Recent Labs    07/19/20 0942 07/20/20 0622 07/21/20 0334  HGB  --  8.8*  --   HCT  --  28.5*  --   PLT  --  321  --   LABPROT 19.0* 19.9* 20.7*  INR 1.7* 1.8* 1.9*  CREATININE 1.73*  --   --     Estimated Creatinine Clearance: 27.7 mL/min (A) (by C-G formula based on SCr of 1.73 mg/dL (H)).   Assessment: 26 yof who presenting with question ST elevation, which resolved in setting of acute hypoxic resp failure likely secondary to pulm edema. On warfarin PTA - admission INR 1 (unknown timing of last dose). Was on heparin prior to cath where received DES to RCA. Patient to continue triple therapy with asa/plavix/warfarin at least 1 month. PTA regimen 5 mg daily except 7.5 mg qSat  INR trending up slowly to 1.9. Likely to be therapeutic in AM. She will likely need a much higher dose for home.   Goal of Therapy:  INR 2-3 Monitor platelets by anticoagulation protocol: Yes   Plan:  Warfarin 10mg  PO x1 Enoxaparin to 30mg  SQ daily Daily INR  Onnie Boer, PharmD, BCIDP, AAHIVP, CPP Infectious Disease Pharmacist 07/21/2020 1:19 PM

## 2020-07-22 DIAGNOSIS — I2111 ST elevation (STEMI) myocardial infarction involving right coronary artery: Secondary | ICD-10-CM | POA: Diagnosis not present

## 2020-07-22 DIAGNOSIS — I5021 Acute systolic (congestive) heart failure: Secondary | ICD-10-CM | POA: Diagnosis not present

## 2020-07-22 LAB — CBC
HCT: 28.7 % — ABNORMAL LOW (ref 36.0–46.0)
Hemoglobin: 8.9 g/dL — ABNORMAL LOW (ref 12.0–15.0)
MCH: 27.2 pg (ref 26.0–34.0)
MCHC: 31 g/dL (ref 30.0–36.0)
MCV: 87.8 fL (ref 80.0–100.0)
Platelets: 328 10*3/uL (ref 150–400)
RBC: 3.27 MIL/uL — ABNORMAL LOW (ref 3.87–5.11)
RDW: 14.1 % (ref 11.5–15.5)
WBC: 6.2 10*3/uL (ref 4.0–10.5)
nRBC: 0 % (ref 0.0–0.2)

## 2020-07-22 LAB — GLUCOSE, CAPILLARY
Glucose-Capillary: 142 mg/dL — ABNORMAL HIGH (ref 70–99)
Glucose-Capillary: 291 mg/dL — ABNORMAL HIGH (ref 70–99)

## 2020-07-22 LAB — BASIC METABOLIC PANEL
Anion gap: 11 (ref 5–15)
BUN: 41 mg/dL — ABNORMAL HIGH (ref 8–23)
CO2: 29 mmol/L (ref 22–32)
Calcium: 8.7 mg/dL — ABNORMAL LOW (ref 8.9–10.3)
Chloride: 101 mmol/L (ref 98–111)
Creatinine, Ser: 1.58 mg/dL — ABNORMAL HIGH (ref 0.44–1.00)
GFR, Estimated: 31 mL/min — ABNORMAL LOW (ref >60)
Glucose, Bld: 141 mg/dL — ABNORMAL HIGH (ref 70–99)
Potassium: 3.6 mmol/L (ref 3.5–5.1)
Sodium: 141 mmol/L (ref 135–145)

## 2020-07-22 LAB — PROTIME-INR
INR: 1.9 — ABNORMAL HIGH (ref 0.8–1.2)
Prothrombin Time: 21.2 seconds — ABNORMAL HIGH (ref 11.4–15.2)

## 2020-07-22 MED ORDER — FUROSEMIDE 40 MG PO TABS: 40.0000 mg | ORAL_TABLET | Freq: Every day | ORAL | 2 refills | Status: DC

## 2020-07-22 MED ORDER — ATORVASTATIN CALCIUM 40 MG PO TABS: 40.0000 mg | ORAL_TABLET | Freq: Every day | ORAL | 2 refills | Status: DC

## 2020-07-22 MED ORDER — CARVEDILOL 6.25 MG PO TABS: 6.2500 mg | ORAL_TABLET | Freq: Two times a day (BID) | ORAL | 2 refills | Status: DC

## 2020-07-22 MED ORDER — WARFARIN SODIUM 10 MG PO TABS
10.0000 mg | ORAL_TABLET | Freq: Once | ORAL | Status: AC
  Administered 2020-07-22: 10 mg via ORAL
  Filled 2020-07-22: qty 1

## 2020-07-22 MED ORDER — CLOPIDOGREL BISULFATE 75 MG PO TABS: 75.0000 mg | ORAL_TABLET | Freq: Every day | ORAL | 2 refills | Status: DC

## 2020-07-22 NOTE — Progress Notes (Signed)
Progress Note  Patient Name: [name] Date of Encounter: 07/22/2020  [facility] Cardiologist: Dorris Carnes, MD   Subjective   Feeling well.  Ambulated the halls without difficulty.  No chest pain.   Inpatient Medications    Scheduled Meds: . aspirin  81 mg Oral Daily  . atorvastatin  40 mg Oral Daily  . carvedilol  6.25 mg Oral BID WC  . Chlorhexidine Gluconate Cloth  6 each Topical Daily  . clopidogrel  75 mg Oral Q breakfast  . docusate sodium  100 mg Oral BID  . enoxaparin (LOVENOX) injection  30 mg Subcutaneous Q24H  . furosemide  40 mg Oral Daily  . insulin aspart  0-15 Units Subcutaneous TID WC  . insulin aspart  0-5 Units Subcutaneous QHS  . insulin aspart  3 Units Subcutaneous TID WC  . iron polysaccharides  150 mg Oral Daily  . oxymetazoline  1 spray Each Nare BID  . polyethylene glycol  17 g Oral Daily  . sodium chloride flush  3 mL Intravenous Q12H  . sodium chloride flush  3 mL Intravenous Q12H  . warfarin  10 mg Oral ONCE-1600  . Warfarin - Pharmacist Dosing Inpatient   Does not apply q1600   Continuous Infusions: . sodium chloride    . sodium chloride    . sodium chloride     PRN Meds: sodium chloride, sodium chloride, acetaminophen, ondansetron (ZOFRAN) IV, sodium chloride flush, sodium chloride flush   Vital Signs    Vitals:   07/22/20 0638 07/22/20 0804 07/22/20 0819 07/22/20 1001  BP:  (!) 150/57 (!) 108/57 (!) 150/57  Pulse:  79 76 83  Resp:  20 20   Temp:   98.3 F (36.8 C)   TempSrc:   Oral   SpO2:   96%   Weight: 79.9 kg     Height: 5' 5.5" (1.664 m)       Intake/Output Summary (Last 24 hours) at 07/22/2020 1015 Last data filed at 07/22/2020 1010 Gross per 24 hour  Intake 123 ml  Output 750 ml  Net -627 ml   Last 3 Weights 07/22/2020 07/21/2020 07/20/2020  Weight (lbs) 176 lb 2.4 oz 179 lb 14.4 oz 181 lb 8 oz  Weight (kg) 79.9 kg 81.602 kg 82.328 kg      Telemetry    Sinus rhythm.  PVCs. - Personally Reviewed  ECG      n/a - Personally Reviewed  Physical Exam   VS:  BP (!) 132/53   Pulse 77   Temp 98.3 F (36.8 C) (Oral)   Resp (!) 23   Ht 5' 5.5" (1.664 m)   Wt 79.9 kg   SpO2 98%   BMI 28.87 kg/m  , BMI Body mass index is 28.87 kg/m. GENERAL:  Well appearing HEENT: Pupils equal round and reactive, fundi not visualized, oral mucosa unremarkable NECK:  No jugular venous distention, waveform within normal limits, carotid upstroke brisk and symmetric, no bruits LUNGS:  Clear to auscultation bilaterally HEART:  RRR.  PMI not displaced or sustained,S1 and S2 within normal limits, no S3, no S4, no clicks, no rubs, no murmurs ABD:  Flat, positive bowel sounds normal in frequency in pitch, no bruits, no rebound, no guarding, no midline pulsatile mass, no hepatomegaly, no splenomegaly EXT:  2 plus pulses throughout, no edema, no cyanosis no clubbing.  No groin hematoma. SKIN:  No rashes no nodules NEURO:  Cranial nerves II through XII grossly intact, motor grossly intact throughout  PSYCH:  Cognitively intact, oriented to person place and time   Labs    High Sensitivity Troponin:   Recent Labs  Lab 07/14/20 0041 07/14/20 0212 07/14/20 0730 07/14/20 1147  TROPONINIHS 264* 315* 604* 3,485*      Chemistry Recent Labs  Lab 07/16/20 0330 07/16/20 0330 07/17/20 0502 07/17/20 0502 07/18/20 0425 07/19/20 0942 07/22/20 0304  NA 139   < > 141   < > 138 139 141  K 3.9   < > 3.4*   < > 4.0 3.9 3.6  CL 105   < > 100   < > 100 98 101  CO2 21*   < > 30   < > 25 29 29   GLUCOSE 210*   < > 125*   < > 194* 299* 141*  BUN 29*   < > 33*   < > 34* 37* 41*  CREATININE 1.57*   < > 1.82*   < > 1.56* 1.73* 1.58*  CALCIUM 9.0   < > 8.7*   < > 8.9 8.6* 8.7*  GFRNONAA 31*   < > 26*   < > 31* 27* 31*  GFRAA 36*  --  30*  --  36*  --   --   ANIONGAP 13   < > 11   < > 13 12 11    < > = values in this interval not displayed.     Hematology Recent Labs  Lab 07/16/20 0330 07/16/20 0330 07/20/20 0622  07/20/20 1043 07/22/20 0304  WBC 9.6  --  5.0  --  6.2  RBC 3.44*   < > 3.23* 3.04* 3.27*  HGB 9.6*  --  8.8*  --  8.9*  HCT 30.5*  --  28.5*  --  28.7*  MCV 88.7  --  88.2  --  87.8  MCH 27.9  --  27.2  --  27.2  MCHC 31.5  --  30.9  --  31.0  RDW 14.9  --  14.3  --  14.1  PLT 249  --  321  --  328   < > = values in this interval not displayed.    BNPNo results for input(s): BNP, PROBNP in the last 168 hours.   DDimer No results for input(s): DDIMER in the last 168 hours.   Radiology    No results found.  Cardiac Studies   2D echocardiogram (07/14/2020)  IMPRESSIONS   1. Septal hypokinesis with mild LV dysfunction.  2. Left ventricular ejection fraction, by estimation, is 45 to 50%. The  left ventricle has mildly decreased function. The left ventricle  demonstrates regional wall motion abnormalities (see scoring  diagram/findings for description). There is moderate  left ventricular hypertrophy. Left ventricular diastolic parameters are  consistent with Grade II diastolic dysfunction (pseudonormalization).  Elevated left atrial pressure.  3. Right ventricular systolic function is normal. The right ventricular  size is normal.  4. Left atrial size was severely dilated.  5. The mitral valve is normal in structure. No evidence of mitral valve  regurgitation. No evidence of mitral stenosis. Severe mitral annular  calcification.  6. The aortic valve is tricuspid. Aorti 1 3c valve regurgitation is not  visualized. Mild to moderate aortic valve stenosis.  7. The inferior vena cava is dilated in size with <50% respiratory  variability, suggesting right atrial pressure of 15 mmHg.  Cardiac catheterization/PCI and stent (07/14/2020)  Conclusion  1. Multivessel coronary artery disease with patency of the left mainstem, patency of  the LAD including the stented segment in that vessel, severe stenosis of the 1st diagonal branch of the LAD, severe diffuse stenosis of  a small left circumflex, moderately severe stenosis of the ramus intermedius, and subtotal occlusion of the distal RCA 2. Successful PCI of the RCA using a 3.0 x 26 mm resolute Onyx DES 3. Normal LVEDP  Recommendations: Post MI medical therapy, once cangrelor infusion is completed would load with clopidogrel 600 mg x 1 and 75 mg daily. Restart warfarin today as long as no further procedures planned. Recommend medical therapy for residual CAD.  Coronary Diagrams  Diagnostic Dominance: Co-dominant  Intervention     Patient Profile     79 y.o. female with CAD status post DES to the RCA in 2009 and DES to the mid LAD in 2006, hypertension, hyperlipidemia, pulmonary embolism on warfarin admitted with inferior STEMI.  Assessment & Plan    # NSTEMI:  # Hyperlipidemia: Cardiac cath revealed obstructive disease in D1 and a small left circumflex.  There is moderate stenosis of RI and RCA.  She underwent RCA PCI.  There was no intervention on the left circumflex.  Continue triple therapy with aspirin, Plavix, and warfarin for 1 month.  Then she will be on Plavix and warfarin at least 1 year.  Continue carvedilol and atorvastatin.  LDL was 91 on 07/14/20.  Atorvastatin was increased to 40mg  from 10mg .  Need to check lipids/CMP in 3 months.   # Acute systolic and diastolic heart failure: # Flash pulmonary edema:  # Hypertension:  Echo this admission revealed LVEF 45 to 50% with moderate LVH and grade 2 diastolic dysfunction.  She is euvolemic today.  LVEF was 55% in 2018.  Amlodipine, enalapril, and HCTZ have been held.  She is now on carvedilol and lasix.  BP is low to stable.  Recommend starting ARB or Entresto as an outpatient as BP allows.  # Mild-moderate aortic stenosis: Outpatient follow up.  # Acute on chronic anemia:   Baseline hemoglobin is between 10 and 11.  It trended down to 8.8 from 9.6 but has now been stable.  # DM:  Resume home metformin at discharge.  She has CKD.   Would consider [facility] as an outpatient.  # PE:  Continue warfarin.  INR goal is 2-3.  It was 1.9 today.  Need INR check this week.  # Sacral redness- daughter was concerned about decubitis - this was evaluated with nursing, there is some mild redness, but no evidence of induration, early ulceration or development of decubitus. The patient has been ambulating and changing position.    For questions or updates, please contact [place] Please consult [email or web address] for contact info under        Signed, Skeet Latch, MD  07/22/2020, 10:15 AM

## 2020-07-22 NOTE — TOC Transition Note (Addendum)
Transition of Care [facility]) - CM/SW Discharge Note   Patient Details  Name: [name] MRN: [id number] Date of Birth: Mar 15, 1941  Transition of [facility]) CM/SW Contact:  Carles Collet, RN Phone Number: 07/22/2020, 1:30 PM   Clinical Narrative:    Damaris Schooner w patient's daughter. She would like [place] services. Requested HH PT order from MD. Referral made to [facility], discussed ratings. No DME needs, daughter to transport home.   will go to daughters house [address] [place] [address] Daughter cell is contact Hubbs,Pam Daughter   [phone]       Final next level of care: Home w Home Health Services Barriers to Discharge: No Barriers Identified   Patient Goals and CMS Choice Patient states their goals for this hospitalization and ongoing recovery are:: to go home CMS Medicare.gov Compare Post Acute Care list provided to:: Other (Comment Required) Choice offered to / list presented to : Adult Children  Discharge Placement                       Discharge Plan and Services                          HH Arranged: PT [place] Agency: [place] ([facility]) Date [place]: 07/22/20 Time [place]: 1330 Representative spoke with at [place]: Corene [facility]  Social Determinants of Health (SDOH) Interventions     Readmission Risk Interventions No flowsheet data found.

## 2020-07-22 NOTE — Discharge Summary (Signed)
Discharge Summary    Patient ID: [name] MRN: [id number]; DOB: 1940/11/12  Admit date: 07/14/2020 Discharge date: 07/22/2020  Primary Care Provider: Celene Squibb, MD  Primary Cardiologist: [facility]. [facility]  Discharge Diagnoses    Principal Problem:   Acute heart failure [facility]) Active Problems:   Essential hypertension   CAD (coronary artery disease)   Left arm numbness   DM type 2 (diabetes mellitus, type 2) ([place])   CKD (chronic kidney disease), stage III ([place])   Non-ST elevation (NSTEMI) myocardial infarction [facility])   Acute ST elevation myocardial infarction (STEMI) involving right coronary artery ([place])  Diagnostic Studies/Procedures    2D echocardiogram (07/14/2020):  1. Septal hypokinesis with mild LV dysfunction.  2. Left ventricular ejection fraction, by estimation, is 45 to 50%. The  left ventricle has mildly decreased function. The left ventricle  demonstrates regional wall motion abnormalities (see scoring  diagram/findings for description). There is moderate  left ventricular hypertrophy. Left ventricular diastolic parameters are  consistent with Grade II diastolic dysfunction (pseudonormalization).  Elevated left atrial pressure.  3. Right ventricular systolic function is normal. The right ventricular  size is normal.  4. Left atrial size was severely dilated.  5. The mitral valve is normal in structure. No evidence of mitral valve  regurgitation. No evidence of mitral stenosis. Severe mitral annular  calcification.  6. The aortic valve is tricuspid. Aorti 1 3c valve regurgitation is not  visualized. Mild to moderate aortic valve stenosis.  7. The inferior vena cava is dilated in size with <50% respiratory  variability, suggesting right atrial pressure of 15 mmHg.  Cardiac catheterization/PCI and stent (07/14/2020)  Conclusion  1. Multivessel coronary artery disease with patency of the left mainstem, patency of the LAD including the stented  segment in that vessel, severe stenosis of the 1st diagonal branch of the LAD, severe diffuse stenosis of a small left circumflex, moderately severe stenosis of the ramus intermedius, and subtotal occlusion of the distal RCA 2. Successful PCI of the RCA using a 3.0 x 26 mm resolute Onyx DES 3. Normal LVEDP  Recommendations: Post MI medical therapy, once cangrelor infusion is completed would load with clopidogrel 600 mg x 1 and 75 mg daily. Restart warfarin today as long as no further procedures planned. Recommend medical therapy for residual CAD.  Coronary Diagrams  Diagnostic Dominance: Co-dominant  Intervention     History of Present Illness     [name] is a 79 y.o. female with a history of CAD s/p multiple PCI, HTN, HLD, remote bilateral PE on warfarin and HFpEF who presented to [facility] 07/14/20 with left arm numbness and shortness of breath.  [name] was in her usual state of health on day of presentation until she developed discomfort in her left arm and numbness in her left fingertips. She called her daughter at 11:45pm complaining of these symptoms. On arrival to [place], she had nonspecific ST segment changes and the STEMI line was activated. She did well during transport to [facility] and was stable on room air. On arrival to [facility], she denied any chest pain but endorsed significant shortness of breath. She was found to be sating in the 70s and was placed on a nonrebreather. She endorsed new swelling in her legs and ongoing numbness in her left finger tips.   EKG showed subtle ST segment changes. Bedside echo performed by Dr. Randolm Idol normal EF without any obvious regional wall motion abnormality around 60% however there was  concern for severe three-vessel disease with critical left main stenosis given her flash pulmonary edema. BNP 397. hsT 64,315 on repeat. Case discussed with interventional cardiologist Dr. Burt Knack who felt that given minimal vent  settings and concern for critical three-vessel disease or possible left main disease plan was to proceed with LHC.  Hospital Course     Cardiac catheterization revealed severe stenosis of the first diagonal, severe diffuse disease and small left circumflex, moderate stenosis in the ramus intermedius and subtotal occlusion of the RCA. Patient had PCI of the right coronary artery with drug-eluting stent.  Hospital course as below:  CAD s/p STEMI with PCI/DES to RCA 07/14/20 with prior PCI/DES to LAD in 2006, RCA in 2009:  -Cardiac cath revealed obstructive disease in D1 and a small left circumflex. There is moderate stenosis of RI and RCA.  She underwent RCA PCI.  There was no intervention on the left circumflex.   -Continue triple therapy with aspirin, Plavix, and warfarin for 1 month. Then she will be on Plavix and warfarin at least 1 year.  -Continue carvedilol and atorvastatin.   Acute on chronic anemia:  -Baseline hemoglobin is between 10 and 11. It trended down to 8.8 from 9.6 but has now been stable. -No overt bleeding though she does think her stools may have been a bit darker.    Flash pulmonary edema:  -Required brief intubation. She diuresed well.  Net -3.3L this admission. Home HCTZ held and she has been continued on lasix 40mg  daily. -Continue po lasix -Continue to monitor strict I&Os and daily weights   HTN:  -Echo this admission revealed LVEF 45 to 50% with moderate LVH and grade 2 diastolic dysfunction.  -LVEF was 55% in 2018.  -Amlodipine, enalapril, and HCTZ have been held -She is now on carvedilol and lasix with BP is low to stable.  -Recommend starting ARB or Entresto as an outpatient as BP allows.  -Continue carvedilol and lasix  HLD:  -LDL was 91 on 07/14/20. Atorvastatin was increased to 40mg  from 10mg .   -Need to check lipids/CMP in 3 months.   Aortic stenosis:  -Mild-moderate on echo this admission -Continue routine monitoring outpatient  DM type 2:    -A1C 7.3 this admission; goal is <7. Metformin on hold this admission -Resume metformin at discharge  History of PE: -Coumadin resumed following LHC. INR 1.9 today; goal 2-3 -She has been treated with SQ Lovenox given poor response to Coumadin dosing. Spoke with pharmacy today who recommends giving her SQ Lovenox today (already given) and Coumadin 10mg x1 then resume PTA Coumadin dosing at 5mg  PO QD except 7.5mg  on Saturdays. I will personally message [facility] INR clinic to see her Monday or Tuesday for re-check.   Consultants: None   The patient was seen and examined by Dr. Oval Linsey who feels that she is stable and ready for discharge today.  She is ambulated in the halls without difficulty.  Did the patient have an acute coronary syndrome (MI, NSTEMI, STEMI, etc) this admission?:  No                               Did the patient have a percutaneous coronary intervention (stent / angioplasty)?:  Yes.     Cath/PCI Registry Performance & Quality Measures: 1. Aspirin prescribed? - Yes 2. ADP Receptor Inhibitor (Plavix/Clopidogrel, Brilinta/Ticagrelor or Effient/Prasugrel) prescribed (includes medically managed patients)? - Yes 3. High Intensity Statin (Lipitor 40-80mg  or Crestor 20-40mg ) prescribed? -  Yes 4. For EF <40%, was ACEI/ARB prescribed? - Not Applicable (EF >/= 41%) 5. For EF <40%, Aldosterone Antagonist (Spironolactone or Eplerenone) prescribed? - Not Applicable (EF >/= 93%) 6. Cardiac Rehab Phase II ordered? - Yes   _____________  Discharge Vitals Blood pressure 108/64, pulse 75, temperature 98.4 F (36.9 C), temperature source Oral, resp. rate 18, height 5' 5.5" (1.664 m), weight 79.9 kg, SpO2 99 %.  Filed Weights   07/20/20 0334 07/21/20 0433 07/22/20 0638  Weight: 82.3 kg 81.6 kg 79.9 kg    Labs & Radiologic Studies    CBC Recent Labs    07/20/20 0622 07/22/20 0304  WBC 5.0 6.2  HGB 8.8* 8.9*  HCT 28.5* 28.7*  MCV 88.2 87.8  PLT 321 790   Basic Metabolic  Panel Recent Labs    07/22/20 0304  NA 141  K 3.6  CL 101  CO2 29  GLUCOSE 141*  BUN 41*  CREATININE 1.58*  CALCIUM 8.7*   Liver Function Tests No results for input(s): AST, ALT, ALKPHOS, BILITOT, PROT, ALBUMIN in the last 72 hours. No results for input(s): LIPASE, AMYLASE in the last 72 hours. High Sensitivity Troponin:   Recent Labs  Lab 07/14/20 0041 07/14/20 0212 07/14/20 0730 07/14/20 1147  TROPONINIHS 264* 315* 604* 3,485*    BNP Invalid input(s): POCBNP D-Dimer No results for input(s): DDIMER in the last 72 hours. Hemoglobin A1C No results for input(s): HGBA1C in the last 72 hours. Fasting Lipid Panel No results for input(s): CHOL, HDL, LDLCALC, TRIG, CHOLHDL, LDLDIRECT in the last 72 hours. Thyroid Function Tests No results for input(s): TSH, T4TOTAL, T3FREE, THYROIDAB in the last 72 hours.  Invalid input(s): FREET3 _____________  CARDIAC CATHETERIZATION  Result Date: 07/14/2020 1.  Multivessel coronary artery disease with patency of the left mainstem, patency of the LAD including the stented segment in that vessel, severe stenosis of the 1st diagonal branch of the LAD, severe diffuse stenosis of a small left circumflex, moderately severe stenosis of the ramus intermedius, and subtotal occlusion of the distal RCA 2.  Successful PCI of the RCA using a 3.0 x 26 mm resolute Onyx DES 3.  Normal LVEDP Recommendations: Post MI medical therapy, once cangrelor infusion is completed would load with clopidogrel 600 mg x 1 and 75 mg daily.  Restart warfarin today as long as no further procedures planned.  Recommend medical therapy for residual CAD.  DG CHEST PORT 1 VIEW  Result Date: 07/16/2020 CLINICAL DATA:  Hypoxemia EXAM: PORTABLE CHEST 1 VIEW COMPARISON:  Chest radiograph from one day prior. FINDINGS: Stable cardiomediastinal silhouette with mild cardiomegaly. No pneumothorax. Small bilateral pleural effusions, slightly increased. Mild diffuse prominence of the parahilar  interstitial markings and increased hazy opacity in upper right lung. IMPRESSION: Stable mild cardiomegaly. Mild diffuse prominence of the parahilar interstitial markings and increased hazy opacity in the upper right lung, favor worsening asymmetric pulmonary edema. Small bilateral pleural effusions, slightly increased. Electronically Signed   By: Ilona Sorrel M.D.   On: 07/16/2020 05:06   Portable Chest xray  Result Date: 07/15/2020 CLINICAL DATA:  Respiratory failure EXAM: PORTABLE CHEST 1 VIEW COMPARISON:  July 14, 2020 FINDINGS: The cardiomediastinal silhouette is unchanged and enlarged in contour.Atherosclerotic calcifications of the aorta. Interval extubation and removal of enteric tube. No pleural effusion. No pneumothorax. Mild bronchial wall thickening and interstitial prominence, improved in comparison to prior. Continued improvement in consolidative opacity of the RIGHT upper lung. Visualized abdomen is unremarkable. Multilevel degenerative changes of the thoracic spine.  IMPRESSION: 1. Interval extubation and removal of enteric tube. 2. Continued improvement in pulmonary edema and RIGHT upper lobe consolidative opacity. Electronically Signed   By: Valentino Saxon MD   On: 07/15/2020 08:55   DG Chest Portable 1 View  Result Date: 07/14/2020 CLINICAL DATA:  OG and endotracheal tube placements EXAM: PORTABLE CHEST 1 VIEW COMPARISON:  07/14/2020 FINDINGS: Endotracheal tube placed with tip measuring 4.8 cm above the carina. Enteric tube tip is off the field of view but below the left hemidiaphragm. Cardiac enlargement. Perihilar infiltration or edema. Right upper lung consolidation is improving since prior study. No pleural effusions. No pneumothorax. Calcified and tortuous aorta. IMPRESSION: Appliances appear in satisfactory position. Cardiac enlargement with perihilar infiltration or edema. Improving consolidation in the right upper lung. Electronically Signed   By: Lucienne Capers M.D.   On:  07/14/2020 03:47   DG Chest Port 1 View  Result Date: 07/14/2020 CLINICAL DATA:  Shortness of breath. Chest pain and numbness to the left fingers. Swelling in the left foot and ankle EXAM: PORTABLE CHEST 1 VIEW COMPARISON:  07/14/2020 FINDINGS: Shallow inspiration. Cardiac enlargement. Infiltration and consolidation in the right upper lung likely representing pneumonia. Perihilar infiltrates and interstitial changes may represent edema or multifocal pneumonia. No pleural effusions. No pneumothorax. Calcification of the aorta. Degenerative changes in the spine and shoulders. IMPRESSION: Cardiac enlargement with bilateral perihilar infiltrates and consolidation in the right upper lung. There is progression since the previous study from earlier today. Electronically Signed   By: Lucienne Capers M.D.   On: 07/14/2020 02:25   DG Chest Portable 1 View  Result Date: 07/14/2020 CLINICAL DATA:  Chest pain. EXAM: PORTABLE CHEST 1 VIEW COMPARISON:  October 11, 2014 FINDINGS: Mild to moderate severity diffuse chronic appearing increased interstitial lung markings are noted. There is no evidence of focal consolidation, pleural effusion or pneumothorax. The cardiac silhouette is moderately enlarged. There is marked severity calcification of the aortic arch. A chronic deformity is seen involving the right humeral head. IMPRESSION: Chronic appearing increased interstitial lung markings without evidence of acute or active cardiopulmonary disease. Electronically Signed   By: Virgina Norfolk M.D.   On: 07/14/2020 00:52   DG Abd Portable 1 View  Result Date: 07/14/2020 CLINICAL DATA:  OG tube placement EXAM: PORTABLE ABDOMEN - 1 VIEW COMPARISON:  None. FINDINGS: Enteric tube tip is in the upper abdomen consistent with location in the distal stomach. Gas and stool throughout the colon. No small or large bowel distention. Degenerative changes in the spine and hips. Postoperative change in the right hip. IMPRESSION:  Enteric tube tip in the upper abdomen consistent with location in the distal stomach. Electronically Signed   By: Lucienne Capers M.D.   On: 07/14/2020 03:48   ECHOCARDIOGRAM COMPLETE  Result Date: 07/14/2020    ECHOCARDIOGRAM REPORT   Patient Name:   MAYTHE DERAMO Talton Date of Exam: 07/14/2020 Medical Rec #:  [id number]        Height:       65.5 in Accession #:    [id number]       Weight:       183.9 lb Date of Birth:  06-20-1941        BSA:          1.920 m Patient Age:    79 years         BP:           68/39 mmHg Patient Gender: F  HR:           63 bpm. Exam Location:  Inpatient Procedure: 2D Echo, Cardiac Doppler, Color Doppler and Intracardiac            Opacification Agent Indications:    Abnormal ECG  History:        Patient has prior history of Echocardiogram examinations, most                 recent 03/25/2017. CAD, Arrythmias:Atrial Fibrillation; Risk                 Factors:Hypertension, Diabetes and Former Smoker. CKD.  Sonographer:    Clayton Lefort RDCS (AE) Referring Phys: [id number] Alcario Drought CONIGLIO  Sonographer Comments: Echo performed with patient supine and on artificial respirator. IMPRESSIONS  1. Septal hypokinesis with mild LV dysfunction.  2. Left ventricular ejection fraction, by estimation, is 45 to 50%. The left ventricle has mildly decreased function. The left ventricle demonstrates regional wall motion abnormalities (see scoring diagram/findings for description). There is moderate left ventricular hypertrophy. Left ventricular diastolic parameters are consistent with Grade II diastolic dysfunction (pseudonormalization). Elevated left atrial pressure.  3. Right ventricular systolic function is normal. The right ventricular size is normal.  4. Left atrial size was severely dilated.  5. The mitral valve is normal in structure. No evidence of mitral valve regurgitation. No evidence of mitral stenosis. Severe mitral annular calcification.  6. The aortic valve is tricuspid. Aortic valve  regurgitation is not visualized. Mild to moderate aortic valve stenosis.  7. The inferior vena cava is dilated in size with <50% respiratory variability, suggesting right atrial pressure of 15 mmHg. FINDINGS  Left Ventricle: Left ventricular ejection fraction, by estimation, is 45 to 50%. The left ventricle has mildly decreased function. The left ventricle demonstrates regional wall motion abnormalities. Definity contrast agent was given IV to delineate the left ventricular endocardial borders. The left ventricular internal cavity size was normal in size. There is moderate left ventricular hypertrophy. Left ventricular diastolic parameters are consistent with Grade II diastolic dysfunction (pseudonormalization). Elevated left atrial pressure. Right Ventricle: The right ventricular size is normal. Right ventricular systolic function is normal. Left Atrium: Left atrial size was severely dilated. Right Atrium: Right atrial size was normal in size. Pericardium: Trivial pericardial effusion is present. Mitral Valve: The mitral valve is normal in structure. Severe mitral annular calcification. No evidence of mitral valve regurgitation. No evidence of mitral valve stenosis. MV peak gradient, 7.1 mmHg. The mean mitral valve gradient is 2.0 mmHg. Tricuspid Valve: The tricuspid valve is normal in structure. Tricuspid valve regurgitation is trivial. No evidence of tricuspid stenosis. Aortic Valve: The aortic valve is tricuspid. Aortic valve regurgitation is not visualized. Mild to moderate aortic stenosis is present. Aortic valve mean gradient measures 10.0 mmHg. Aortic valve peak gradient measures 17.0 mmHg. Aortic valve area, by VTI measures 1.40 cm. Pulmonic Valve: The pulmonic valve was normal in structure. Pulmonic valve regurgitation is not visualized. No evidence of pulmonic stenosis. Aorta: The aortic root is normal in size and structure. Venous: The inferior vena cava is dilated in size with less than 50% respiratory  variability, suggesting right atrial pressure of 15 mmHg. IAS/Shunts: No atrial level shunt detected by color flow Doppler. Additional Comments: Septal hypokinesis with mild LV dysfunction.  LEFT VENTRICLE PLAX 2D LVIDd:         4.60 cm  Diastology LVIDs:         3.40 cm  LV e' medial:  4.46 cm/s LV PW:         1.70 cm  LV E/e' medial:  27.1 LV IVS:        1.80 cm  LV e' lateral:   9.68 cm/s LVOT diam:     2.20 cm  LV E/e' lateral: 12.5 LV SV:         73 LV SV Index:   38 LVOT Area:     3.80 cm  RIGHT VENTRICLE             IVC RV Basal diam:  3.20 cm     IVC diam: 2.70 cm RV S prime:     10.80 cm/s TAPSE (M-mode): 2.0 cm LEFT ATRIUM              Index       RIGHT ATRIUM           Index LA diam:        3.60 cm  1.88 cm/m  RA Area:     17.00 cm LA Vol (A2C):   114.0 ml 59.39 ml/m RA Volume:   41.50 ml  21.62 ml/m LA Vol (A4C):   84.0 ml  43.76 ml/m LA Biplane Vol: 98.7 ml  51.42 ml/m  AORTIC VALVE AV Area (Vmax):    1.44 cm AV Area (Vmean):   1.36 cm AV Area (VTI):     1.40 cm AV Vmax:           206.00 cm/s AV Vmean:          144.667 cm/s AV VTI:            0.523 m AV Peak Grad:      17.0 mmHg AV Mean Grad:      10.0 mmHg LVOT Vmax:         78.30 cm/s LVOT Vmean:        51.800 cm/s LVOT VTI:          0.192 m LVOT/AV VTI ratio: 0.37  AORTA Ao Root diam: 3.00 cm Ao Asc diam:  3.60 cm MITRAL VALVE MV Area (PHT): 3.21 cm     SHUNTS MV Peak grad:  7.1 mmHg     Systemic VTI:  0.19 m MV Mean grad:  2.0 mmHg     Systemic Diam: 2.20 cm MV Vmax:       1.33 m/s MV Vmean:      62.4 cm/s MV Decel Time: 236 msec MV E velocity: 121.00 cm/s MV A velocity: 88.10 cm/s MV E/A ratio:  1.37 Kirk Ruths MD Electronically signed by Kirk Ruths MD Signature Date/Time: 07/14/2020/1:32:31 PM    Final    Disposition   Pt is being discharged home today in good condition.  Follow-up Plans & Appointments     Follow-up Information    Satira Sark, MD Follow up.   Specialty: Cardiology Why: The office will call  you with the date and time of appointment Contact information: [place] [address] [phone]        [facility] Follow up.   Specialty: Cardiology Why: Need INR appointment 07/24/2020.  Office will call you with date and time of appointment.  If you have not heard anything by midmorning on 07/24/2020 please call the office. Contact information: [place] [phone]             Discharge Instructions    Amb Referral  to Cardiac Rehabilitation   Complete by: As directed    Diagnosis:  Coronary Stents NSTEMI PTCA     After initial evaluation and assessments completed: Virtual Based Care may be provided alone or in conjunction with Phase 2 Cardiac Rehab based on patient barriers.: Yes   Call MD for:  difficulty breathing, headache or visual disturbances   Complete by: As directed    Call MD for:  extreme fatigue   Complete by: As directed    Call MD for:  hives   Complete by: As directed    Call MD for:  persistant dizziness or light-headedness   Complete by: As directed    Call MD for:  persistant nausea and vomiting   Complete by: As directed    Call MD for:  redness, tenderness, or signs of infection (pain, swelling, redness, odor or green/yellow discharge around incision site)   Complete by: As directed    Call MD for:  severe uncontrolled pain   Complete by: As directed    Call MD for:  temperature >100.4   Complete by: As directed    Diet - low sodium heart healthy   Complete by: As directed    Discharge instructions   Complete by: As directed    No driving for *3 days. No lifting over 5 lbs for 1 week. No sexual activity for 1 week. Keep procedure site clean & dry. If you notice increased pain, swelling, bleeding or pus, call/return!  You may shower, but no soaking baths/hot tubs/pools for 1 week.  Resume previous Coumadin dosing tomorrow, 07/23/2020 at 5 mg by  mouth per day except for Saturdays at 7.5 mg.  Please call the [facility] office for INR check, preferably 07/24/2020.   Increase activity slowly   Complete by: As directed       Discharge Medications   Allergies as of 07/22/2020   No Known Allergies     Medication List    STOP taking these medications   amLODipine 5 MG tablet Commonly known as: NORVASC   enalapril 10 MG tablet Commonly known as: VASOTEC   hydrochlorothiazide 25 MG tablet Commonly known as: HYDRODIURIL   metoprolol tartrate 50 MG tablet Commonly known as: LOPRESSOR     TAKE these medications   ALPRAZolam 0.25 MG tablet Commonly known as: XANAX Take 0.25 mg by mouth 2 (two) times daily as needed.   aspirin EC 81 MG tablet Take 81 mg by mouth daily.   atorvastatin 40 MG tablet Commonly known as: LIPITOR Take 1 tablet (40 mg total) by mouth daily. Start taking on: July 23, 2020 What changed:   medication strength  how much to take   carvedilol 6.25 MG tablet Commonly known as: COREG Take 1 tablet (6.25 mg total) by mouth 2 (two) times daily with a meal.   clopidogrel 75 MG tablet Commonly known as: PLAVIX Take 1 tablet (75 mg total) by mouth daily with breakfast. Start taking on: July 23, 2020   Co Q 10 100 MG Caps Take 100 mg by mouth daily.   furosemide 40 MG tablet Commonly known as: LASIX Take 1 tablet (40 mg total) by mouth daily. Start taking on: July 23, 2020   MEGARED OMEGA-3 KRILL OIL PO Take 1,000 mg by mouth daily. When she remembers   metFORMIN 500 MG tablet Commonly known as: GLUCOPHAGE Take 1 tablet (500 mg total) by mouth 2 (two) times daily with a meal. What changed: how much to take   nitroGLYCERIN 0.4  MG SL tablet Commonly known as: NITROSTAT Place 0.4 mg under the tongue every 5 (five) minutes x 3 doses as needed for chest pain.   VITAMIN C GUMMIES PO Take 2 each by mouth daily. 180 mg   warfarin 5 MG tablet Commonly known as: COUMADIN Take as directed.  If you are unsure how to take this medication, talk to your nurse or doctor. Original instructions: Take 5-7.5 mg by mouth daily at 6 PM. Takes 7.5 mg every day except on Saturdays takes 5 mg        Outstanding Labs/Studies   INR check 07/24/2020  Duration of Discharge Encounter   Greater than 30 minutes including physician time.  Signed, Kathyrn Drown, NP 07/22/2020, 12:32 PM

## 2020-07-22 NOTE — Progress Notes (Signed)
ANTICOAGULATION CONSULT NOTE  Pharmacy Consult for warfarin Indication: atrial fibrillation  No Known Allergies  Patient Measurements: Height: 5' 5.5" (166.4 cm) Weight: 79.9 kg (176 lb 2.4 oz) IBW/kg (Calculated) : 58.15  Vital Signs: Temp: 98.4 F (36.9 C) (10/09 0355) Temp Source: Oral (10/09 0355) BP: 103/46 (10/09 0355) Pulse Rate: 72 (10/09 0355)  Labs: Recent Labs    07/19/20 0942 07/19/20 0942 07/20/20 0622 07/21/20 0334 07/22/20 0304  HGB  --   --  8.8*  --  8.9*  HCT  --   --  28.5*  --  28.7*  PLT  --   --  321  --  328  LABPROT 19.0*   < > 19.9* 20.7* 21.2*  INR 1.7*   < > 1.8* 1.9* 1.9*  CREATININE 1.73*  --   --   --  1.58*   < > = values in this interval not displayed.    Estimated Creatinine Clearance: 30 mL/min (A) (by C-G formula based on SCr of 1.58 mg/dL (H)).   Assessment: 31 yof who presenting with question ST elevation, which resolved in setting of acute hypoxic resp failure likely secondary to pulm edema. On warfarin PTA - admission INR 1 (unknown timing of last dose). Was on heparin prior to cath where received DES to RCA. Patient to continue triple therapy with asa/plavix/warfarin at least 1 month. PTA regimen 5 mg daily except 7.5 mg qSat  INR today remains the same, 1.9. Hgb low ~8-9, has had bleeding recently. PLT WNL. Good PO intake, 100% of meals charted. Will maintain current dose and increase tomorrow if still subtherapeutic.   She will likely need a much higher dose for home.    Goal of Therapy:  INR 2-3 Monitor platelets by anticoagulation protocol: Yes   Plan:  Warfarin 10mg  PO x1 Enoxaparin to 30mg  SQ daily Daily INR and CBC Monitor for s/sx bleeding Follow plans for discharge  Mercy Riding, PharmD PGY1 Acute Care Pharmacy Resident Please refer to [facility] for unit-specific pharmacist

## 2020-07-24 ENCOUNTER — Ambulatory Visit (INDEPENDENT_AMBULATORY_CARE_PROVIDER_SITE_OTHER): Payer: Medicare Other | Admitting: *Deleted

## 2020-07-24 DIAGNOSIS — E785 Hyperlipidemia, unspecified: Secondary | ICD-10-CM | POA: Diagnosis not present

## 2020-07-24 DIAGNOSIS — I5031 Acute diastolic (congestive) heart failure: Secondary | ICD-10-CM | POA: Diagnosis not present

## 2020-07-24 DIAGNOSIS — I130 Hypertensive heart and chronic kidney disease with heart failure and stage 1 through stage 4 chronic kidney disease, or unspecified chronic kidney disease: Secondary | ICD-10-CM | POA: Diagnosis not present

## 2020-07-24 DIAGNOSIS — I509 Heart failure, unspecified: Secondary | ICD-10-CM | POA: Diagnosis not present

## 2020-07-24 DIAGNOSIS — Z7902 Long term (current) use of antithrombotics/antiplatelets: Secondary | ICD-10-CM | POA: Diagnosis not present

## 2020-07-24 DIAGNOSIS — N183 Chronic kidney disease, stage 3 unspecified: Secondary | ICD-10-CM | POA: Diagnosis not present

## 2020-07-24 DIAGNOSIS — D631 Anemia in chronic kidney disease: Secondary | ICD-10-CM | POA: Diagnosis not present

## 2020-07-24 DIAGNOSIS — Z7982 Long term (current) use of aspirin: Secondary | ICD-10-CM | POA: Diagnosis not present

## 2020-07-24 DIAGNOSIS — E1122 Type 2 diabetes mellitus with diabetic chronic kidney disease: Secondary | ICD-10-CM | POA: Diagnosis not present

## 2020-07-24 DIAGNOSIS — Z955 Presence of coronary angioplasty implant and graft: Secondary | ICD-10-CM | POA: Diagnosis not present

## 2020-07-24 DIAGNOSIS — Z5181 Encounter for therapeutic drug level monitoring: Secondary | ICD-10-CM | POA: Diagnosis not present

## 2020-07-24 DIAGNOSIS — I2510 Atherosclerotic heart disease of native coronary artery without angina pectoris: Secondary | ICD-10-CM | POA: Diagnosis not present

## 2020-07-24 DIAGNOSIS — I4892 Unspecified atrial flutter: Secondary | ICD-10-CM | POA: Diagnosis not present

## 2020-07-24 DIAGNOSIS — I350 Nonrheumatic aortic (valve) stenosis: Secondary | ICD-10-CM | POA: Diagnosis not present

## 2020-07-24 DIAGNOSIS — J9601 Acute respiratory failure with hypoxia: Secondary | ICD-10-CM | POA: Diagnosis not present

## 2020-07-24 DIAGNOSIS — Z7984 Long term (current) use of oral hypoglycemic drugs: Secondary | ICD-10-CM | POA: Diagnosis not present

## 2020-07-24 DIAGNOSIS — Z7901 Long term (current) use of anticoagulants: Secondary | ICD-10-CM | POA: Diagnosis not present

## 2020-07-24 DIAGNOSIS — Z86711 Personal history of pulmonary embolism: Secondary | ICD-10-CM | POA: Diagnosis not present

## 2020-07-24 DIAGNOSIS — I2111 ST elevation (STEMI) myocardial infarction involving right coronary artery: Secondary | ICD-10-CM | POA: Diagnosis not present

## 2020-07-24 LAB — POCT INR: INR: 2.6 (ref 2.0–3.0)

## 2020-07-24 NOTE — Patient Instructions (Signed)
Continue warfarin 1 tablet daily except 1 1/2 tablets on Saturdays Recheck in 1 wk By [facility] Keep taking with evening meal Will call [facility] and give order to check next week.  She was just admitted to their services today.

## 2020-07-26 DIAGNOSIS — F0150 Vascular dementia without behavioral disturbance: Secondary | ICD-10-CM | POA: Diagnosis not present

## 2020-07-26 DIAGNOSIS — I350 Nonrheumatic aortic (valve) stenosis: Secondary | ICD-10-CM | POA: Diagnosis not present

## 2020-07-26 DIAGNOSIS — D649 Anemia, unspecified: Secondary | ICD-10-CM | POA: Diagnosis not present

## 2020-07-26 DIAGNOSIS — R5383 Other fatigue: Secondary | ICD-10-CM | POA: Diagnosis not present

## 2020-07-26 DIAGNOSIS — I10 Essential (primary) hypertension: Secondary | ICD-10-CM | POA: Diagnosis not present

## 2020-07-26 DIAGNOSIS — I5033 Acute on chronic diastolic (congestive) heart failure: Secondary | ICD-10-CM | POA: Diagnosis not present

## 2020-07-26 DIAGNOSIS — N1832 Chronic kidney disease, stage 3b: Secondary | ICD-10-CM | POA: Diagnosis not present

## 2020-07-26 DIAGNOSIS — E1165 Type 2 diabetes mellitus with hyperglycemia: Secondary | ICD-10-CM | POA: Diagnosis not present

## 2020-07-26 DIAGNOSIS — Z8673 Personal history of transient ischemic attack (TIA), and cerebral infarction without residual deficits: Secondary | ICD-10-CM | POA: Diagnosis not present

## 2020-07-26 DIAGNOSIS — D508 Other iron deficiency anemias: Secondary | ICD-10-CM | POA: Diagnosis not present

## 2020-07-26 DIAGNOSIS — E782 Mixed hyperlipidemia: Secondary | ICD-10-CM | POA: Diagnosis not present

## 2020-07-26 DIAGNOSIS — I129 Hypertensive chronic kidney disease with stage 1 through stage 4 chronic kidney disease, or unspecified chronic kidney disease: Secondary | ICD-10-CM | POA: Diagnosis not present

## 2020-07-26 DIAGNOSIS — I2111 ST elevation (STEMI) myocardial infarction involving right coronary artery: Secondary | ICD-10-CM | POA: Diagnosis not present

## 2020-07-26 DIAGNOSIS — I482 Chronic atrial fibrillation, unspecified: Secondary | ICD-10-CM | POA: Diagnosis not present

## 2020-07-26 DIAGNOSIS — E785 Hyperlipidemia, unspecified: Secondary | ICD-10-CM | POA: Diagnosis not present

## 2020-07-26 DIAGNOSIS — D631 Anemia in chronic kidney disease: Secondary | ICD-10-CM | POA: Diagnosis not present

## 2020-07-28 DIAGNOSIS — I130 Hypertensive heart and chronic kidney disease with heart failure and stage 1 through stage 4 chronic kidney disease, or unspecified chronic kidney disease: Secondary | ICD-10-CM | POA: Diagnosis not present

## 2020-07-28 DIAGNOSIS — D631 Anemia in chronic kidney disease: Secondary | ICD-10-CM | POA: Diagnosis not present

## 2020-07-28 DIAGNOSIS — N183 Chronic kidney disease, stage 3 unspecified: Secondary | ICD-10-CM | POA: Diagnosis not present

## 2020-07-28 DIAGNOSIS — E1122 Type 2 diabetes mellitus with diabetic chronic kidney disease: Secondary | ICD-10-CM | POA: Diagnosis not present

## 2020-07-28 DIAGNOSIS — I2111 ST elevation (STEMI) myocardial infarction involving right coronary artery: Secondary | ICD-10-CM | POA: Diagnosis not present

## 2020-07-28 DIAGNOSIS — I5031 Acute diastolic (congestive) heart failure: Secondary | ICD-10-CM | POA: Diagnosis not present

## 2020-07-31 DIAGNOSIS — Z8673 Personal history of transient ischemic attack (TIA), and cerebral infarction without residual deficits: Secondary | ICD-10-CM | POA: Diagnosis not present

## 2020-07-31 DIAGNOSIS — E1165 Type 2 diabetes mellitus with hyperglycemia: Secondary | ICD-10-CM | POA: Diagnosis not present

## 2020-07-31 DIAGNOSIS — I10 Essential (primary) hypertension: Secondary | ICD-10-CM | POA: Diagnosis not present

## 2020-07-31 DIAGNOSIS — D649 Anemia, unspecified: Secondary | ICD-10-CM | POA: Diagnosis not present

## 2020-07-31 DIAGNOSIS — I482 Chronic atrial fibrillation, unspecified: Secondary | ICD-10-CM | POA: Diagnosis not present

## 2020-07-31 DIAGNOSIS — I350 Nonrheumatic aortic (valve) stenosis: Secondary | ICD-10-CM | POA: Diagnosis not present

## 2020-07-31 DIAGNOSIS — D508 Other iron deficiency anemias: Secondary | ICD-10-CM | POA: Diagnosis not present

## 2020-07-31 DIAGNOSIS — N1832 Chronic kidney disease, stage 3b: Secondary | ICD-10-CM | POA: Diagnosis not present

## 2020-07-31 DIAGNOSIS — R5383 Other fatigue: Secondary | ICD-10-CM | POA: Diagnosis not present

## 2020-07-31 DIAGNOSIS — E785 Hyperlipidemia, unspecified: Secondary | ICD-10-CM | POA: Diagnosis not present

## 2020-07-31 DIAGNOSIS — E782 Mixed hyperlipidemia: Secondary | ICD-10-CM | POA: Diagnosis not present

## 2020-07-31 DIAGNOSIS — F0150 Vascular dementia without behavioral disturbance: Secondary | ICD-10-CM | POA: Diagnosis not present

## 2020-08-01 DIAGNOSIS — D631 Anemia in chronic kidney disease: Secondary | ICD-10-CM | POA: Diagnosis not present

## 2020-08-01 DIAGNOSIS — I2111 ST elevation (STEMI) myocardial infarction involving right coronary artery: Secondary | ICD-10-CM | POA: Diagnosis not present

## 2020-08-01 DIAGNOSIS — N183 Chronic kidney disease, stage 3 unspecified: Secondary | ICD-10-CM | POA: Diagnosis not present

## 2020-08-01 DIAGNOSIS — I5031 Acute diastolic (congestive) heart failure: Secondary | ICD-10-CM | POA: Diagnosis not present

## 2020-08-01 DIAGNOSIS — I130 Hypertensive heart and chronic kidney disease with heart failure and stage 1 through stage 4 chronic kidney disease, or unspecified chronic kidney disease: Secondary | ICD-10-CM | POA: Diagnosis not present

## 2020-08-01 DIAGNOSIS — E1122 Type 2 diabetes mellitus with diabetic chronic kidney disease: Secondary | ICD-10-CM | POA: Diagnosis not present

## 2020-08-02 ENCOUNTER — Ambulatory Visit (INDEPENDENT_AMBULATORY_CARE_PROVIDER_SITE_OTHER): Payer: Medicare Other | Admitting: *Deleted

## 2020-08-02 ENCOUNTER — Telehealth: Payer: Self-pay | Admitting: *Deleted

## 2020-08-02 DIAGNOSIS — I2111 ST elevation (STEMI) myocardial infarction involving right coronary artery: Secondary | ICD-10-CM | POA: Diagnosis not present

## 2020-08-02 DIAGNOSIS — I130 Hypertensive heart and chronic kidney disease with heart failure and stage 1 through stage 4 chronic kidney disease, or unspecified chronic kidney disease: Secondary | ICD-10-CM | POA: Diagnosis not present

## 2020-08-02 DIAGNOSIS — Z7901 Long term (current) use of anticoagulants: Secondary | ICD-10-CM

## 2020-08-02 DIAGNOSIS — D631 Anemia in chronic kidney disease: Secondary | ICD-10-CM | POA: Diagnosis not present

## 2020-08-02 DIAGNOSIS — I5031 Acute diastolic (congestive) heart failure: Secondary | ICD-10-CM | POA: Diagnosis not present

## 2020-08-02 DIAGNOSIS — E1122 Type 2 diabetes mellitus with diabetic chronic kidney disease: Secondary | ICD-10-CM | POA: Diagnosis not present

## 2020-08-02 DIAGNOSIS — I4892 Unspecified atrial flutter: Secondary | ICD-10-CM | POA: Diagnosis not present

## 2020-08-02 DIAGNOSIS — N183 Chronic kidney disease, stage 3 unspecified: Secondary | ICD-10-CM | POA: Diagnosis not present

## 2020-08-02 LAB — POCT INR: INR: 3.3 — AB (ref 2.0–3.0)

## 2020-08-02 NOTE — Progress Notes (Signed)
Cardiology Office Note  Date: 08/03/2020   ID: [name], DOB 1941-05-12, MRN [id number]  PCP:  Celene Squibb, MD  Cardiologist:  Rozann Lesches, MD Electrophysiologist:  None   Chief Complaint: Hospital follow-up NSTEMI, cardiac catheterization/PCI with DES to RCA.  History of Present Illness: [name] is a 79 y.o. female with a history of HTN, CAD, DM 2, CKD stage III, NSTEMI, STEMI, multiple PCI, HLD, remote bilateral PE, HFpEF.  Presented to [facility], ED with discomfort in her left arm and numbness in her left fingertips.  EKG showed nonspecific ST segment changes and STEMI line was activated.  She endorsed significant shortness of breath and was saturating in the 70s since placed on a nonrebreather.  Bedside echo performed by Dr. Audie Box: Normal EF around 60% without any obvious signs of WMA's.  She was noted to be in flash pulmonary edema with BNP of 397. hsT 64, 315 on repeat.  She was sent for left heart cath which revealed severe stenosis of first diagonal, severe diffuse disease small left circumflex, moderate stenosis in ramus intermedius and subtotal occlusion of RCA.  Had PCI of RCA with DES.  She was to continue triple therapy with aspirin, Plavix and warfarin for 1 month then be on Plavix and warfarin at least 1 year.  She was to continue carvedilol and atorvastatin.  She required brief intubation for flash pulmonary edema.  She diuresed well with a net -3.3 L.  Echo revealed EF 45 to 50% with moderate LVH and G2 DD.  Amlodipine, enalapril,and HCTZ were held.  She was on carvedilol and Lasix.  Blood pressure was low to stable.  Recommended  ARB or Entresto as outpatient BP allowing.  She presents today status post NSTEMI.  She denies any anginal or exertional symptoms.  She states she just feels a little different but cannot put her finger on any particular symptom.  She denies any palpitations or arrhythmias, orthostatic symptoms, PND, orthopnea, bleeding.  Denies  any claudication-like symptoms, DVT or PE-like symptoms or lower extremity edema.  Her right groin access site for catheterization looks clean without bruising, redness or pain.  No swelling or edema noted.  She is using a walker to ambulate.  She is accompanied by her daughter.  She states she thinks one of the newer medications are making her feel a little more tired than usual.   Past Medical History:  Diagnosis Date  . Arthritis   . Atrial flutter ([place])    typical appearing  . Bilateral pulmonary embolism ([place]) 08/2008   On coumadin  . Coronary artery disease    s/p DES to mid RCA in 02/2008; s/p inferior MI with DES to mid RCA and mid LAD in 05/2005  . Coronary atherosclerosis   . Diabetes mellitus without complication ([place])   . Hyperlipidemia   . Hypertension    Controlled  . Ischemic cardiomyopathy   . Memory disorder 07/08/2017  . Myocardial infarction ([place])   . Stroke ([place])   . Unspecified combined systolic and diastolic heart failure    Asymptomatic. Echo 04/28/13 EF= 40- 45%  . Unspecified combined systolic and diastolic heart failure     Past Surgical History:  Procedure Laterality Date  . ATRIAL FLUTTER ABLATION N/A 06/17/2013   Procedure: ATRIAL FLUTTER ABLATION;  Surgeon: Thompson Grayer, MD;  Location: [facility] CATH LAB;  Service: Cardiovascular;  Laterality: N/A;  . CAROTID PTA/STENT INTERVENTION N/A 04/10/2017   Procedure: Carotid PTA/Stent Intervention;  Surgeon: Lucky Cowboy,  Erskine Squibb, MD;  Location: [place] CV LAB;  Service: Cardiovascular;  Laterality: N/A;  . CHOLECYSTECTOMY    . CORONARY STENT INTERVENTION N/A 07/14/2020   Procedure: CORONARY STENT INTERVENTION;  Surgeon: Sherren Mocha, MD;  Location: [place] CV LAB;  Service: Cardiovascular;  Laterality: N/A;  . HIP SURGERY    . KNEE ARTHROSCOPY WITH MEDIAL MENISECTOMY Left 09/25/2017   Procedure: KNEE ARTHROSCOPY WITH MEDIAL AND LATERAL MENISECTOMY;  Surgeon: [name], MD;  Location: [facility];  Service:  Orthopedics;  Laterality: Left;  . LEFT HEART CATH AND CORONARY ANGIOGRAPHY N/A 07/14/2020   Procedure: LEFT HEART CATH AND CORONARY ANGIOGRAPHY;  Surgeon: Sherren Mocha, MD;  Location: [place] CV LAB;  Service: Cardiovascular;  Laterality: N/A;    Current Outpatient Medications  Medication Sig Dispense Refill  . ALPRAZolam (XANAX) 0.25 MG tablet Take 0.25 mg by mouth 2 (two) times daily as needed.    . Ascorbic Acid (VITAMIN C GUMMIES PO) Take 2 each by mouth daily. 180 mg    . atorvastatin (LIPITOR) 10 MG tablet Take by mouth.    . carvedilol (COREG) 6.25 MG tablet Take 1 tablet (6.25 mg total) by mouth 2 (two) times daily with a meal. 120 tablet 2  . clopidogrel (PLAVIX) 75 MG tablet Take 1 tablet (75 mg total) by mouth daily with breakfast. 60 tablet 2  . Coenzyme Q10 (CO Q 10) 100 MG CAPS Take 100 mg by mouth daily.    . furosemide (LASIX) 40 MG tablet Take 1 tablet (40 mg total) by mouth daily. 60 tablet 2  . MEGARED OMEGA-3 KRILL OIL PO Take 1,000 mg by mouth daily. When she remembers    . metFORMIN (GLUCOPHAGE) 500 MG tablet Take 1 tablet (500 mg total) by mouth 2 (two) times daily with a meal. 60 tablet 5  . nitroGLYCERIN (NITROSTAT) 0.4 MG SL tablet Place 1 tablet (0.4 mg total) under the tongue every 5 (five) minutes x 3 doses as needed for chest pain. 25 tablet 3  . warfarin (COUMADIN) 5 MG tablet Take 1-1.5 tablets by mouth as directed. 1 tablet by mouth Sunday-Friday and 1.5 tablets by mouth on Saturday     No current facility-administered medications for this visit.   Allergies:  Patient has no known allergies.   Social History: The patient  reports that she quit smoking about 41 years ago. Her smoking use included cigarettes. She quit after 10.00 years of use. She has never used smokeless tobacco. She reports that she does not drink alcohol and does not use drugs.   Family History: The patient's family history includes Arthritis in an other family member; Cancer in an  other family member; Diabetes in an other family member; Heart disease in an other family member; Heart failure in her mother.   ROS:  Please see the history of present illness. Otherwise, complete review of systems is positive for none.  All other systems are reviewed and negative.   Physical Exam: VS:  BP 136/64   Pulse (!) 55   Ht 5' 5.5" (1.664 m)   Wt 176 lb 9.6 oz (80.1 kg)   SpO2 98%   BMI 28.94 kg/m , BMI Body mass index is 28.94 kg/m.  Wt Readings from Last 3 Encounters:  08/03/20 176 lb 9.6 oz (80.1 kg)  07/22/20 176 lb 2.4 oz (79.9 kg)  07/15/18 179 lb (81.2 kg)    General: Patient appears comfortable at rest. Neck: Supple, no elevated JVP or carotid bruits,  no thyromegaly. Lungs: Clear to auscultation, nonlabored breathing at rest. Cardiac: Regular rate and rhythm, no S3 or significant systolic murmur, no pericardial rub. Extremities: No pitting edema, distal pulses 2+. Skin: Warm and dry.  Right groin access site for cardiac catheterization is clean and dry without redness, bruising or edema. Musculoskeletal: No kyphosis. Neuropsychiatric: Alert and oriented x3, affect grossly appropriate.  ECG:  EKG July 16, 2020: Sinus tachycardia with occasional PVC, nonspecific intraventricular conduction block.  Cannot rule out anterior infarct, T wave abnormality consider lateral ischemia, heart rate 121.  Recent Labwork: 07/14/2020: B Natriuretic Peptide 397.2; TSH 2.309 07/22/2020: BUN 41; Creatinine, Ser 1.58; Hemoglobin 8.9; Platelets 328; Potassium 3.6; Sodium 141     Component Value Date/Time   CHOL 177 07/14/2020 0038   TRIG 78 07/14/2020 0212   HDL 62 07/14/2020 0038   CHOLHDL 2.9 07/14/2020 0038   VLDL 24 07/14/2020 0038   [place]frey 91 07/14/2020 0038    Other Studies Reviewed Today:    2D echocardiogram (07/14/2020):  1. Septal hypokinesis with mild LV dysfunction.  2. Left ventricular ejection fraction, by estimation, is 45 to 50%. The  left ventricle  has mildly decreased function. The left ventricle  demonstrates regional wall motion abnormalities (see scoring  diagram/findings for description). There is moderate  left ventricular hypertrophy. Left ventricular diastolic parameters are  consistent with Grade II diastolic dysfunction (pseudonormalization).  Elevated left atrial pressure.  3. Right ventricular systolic function is normal. The right ventricular  size is normal.  4. Left atrial size was severely dilated.  5. The mitral valve is normal in structure. No evidence of mitral valve  regurgitation. No evidence of mitral stenosis. Severe mitral annular  calcification.  6. The aortic valve is tricuspid. Aorti 1 3c valve regurgitation is not  visualized. Mild to moderate aortic valve stenosis.  7. The inferior vena cava is dilated in size with <50% respiratory  variability, suggesting right atrial pressure of 15 mmHg.  Cardiac catheterization/PCI and stent (07/14/2020)  Conclusion  1. Multivessel coronary artery disease with patency of the left mainstem, patency of the LAD including the stented segment in that vessel, severe stenosis of the 1st diagonal branch of the LAD, severe diffuse stenosis of a small left circumflex, moderately severe stenosis of the ramus intermedius, and subtotal occlusion of the distal RCA 2. Successful PCI of the RCA using a 3.0 x 26 mm resolute Onyx DES 3. Normal LVEDP  Recommendations: Post MI medical therapy, once cangrelor infusion is completed would load with clopidogrel 600 mg x 1 and 75 mg daily. Restart warfarin today as long as no further procedures planned. Recommend medical therapy for residual CAD.  Coronary Diagrams  Diagnostic Dominance: Co-dominant  Intervention    Assessment and Plan:  1. Acute ST elevation myocardial infarction (STEMI) involving right coronary artery ([place])   2. Essential hypertension   3. Mixed hyperlipidemia   4. Aortic valve stenosis,  etiology of cardiac valve disease unspecified   5. Type 2 diabetes mellitus with hyperosmolarity without coma, without long-term current use of insulin ([place])   6. History of pulmonary embolism    1.  NSTEMI Recent NSTEMI with subsequent DES to RCA.  History of previous stenting to LAD.  Patient was briefly intubated for flash pulmonary edema.  She was started on Lasix 40 mg p.o. daily.  She denies any recent anginal or exertional symptoms.  She does describe feeling different since leaving the hospital but cannot pinpoint a particular symptom.  States she feels  a little fatigued.  Her heart rate 55 today.  This is most likely from being prescribed carvedilol.  She states she had previously been on metoprolol in the past and it caused her to have significant fatigue.  We will decrease Carvedilol to 3.125 po bid and add Losartan 12.5 daily.  Continue Plavix 75 mg daily.  Continue nitroglycerin sublingual as needed  2. Essential hypertension Blood pressure slightly elevated today at 136/64. Add Losartan 12.5 mg daily.  3. Mixed hyperlipidemia Recent lipid panel on 07/14/2020: TC 177, TG 120, HDL 62, LDL 91.  Continue atorvastatin 10 mg p.o. daily.  4. Aortic valve stenosis, etiology of cardiac valve disease unspecified Recent echo during hospitalization for NSTEMI and PCI on 07/14/2020: EF 45 to 50%, LV mildly decreased function.  LV demonstrated WMA's.  Moderate LVH, G1 DD.  LA size severely dilated, mild to moderate aortic valve stenosis  5. Type 2 diabetes mellitus with hyperosmolarity without coma, without long-term current use of insulin ([place]) Recent random glucose on 07/22/2020 was 291.  Continue Metformin 500 mg p.o. bid twice daily.  Follow-up with PCP for diabetes management  6. History of pulmonary embolism No recent symptoms of PE or DVT.  Recent INR yesterday 3.3.  Continue warfarin as directed.  Follow at Coumadin clinic.  Medication Adjustments/Labs and Tests Ordered: Current medicines  are reviewed at length with the patient today.  Concerns regarding medicines are outlined above.   Disposition: Follow-up with Dr. Domenic Polite.  Patient wants to establish here with Dr. Domenic Polite.  3 months   Signed, Levell July, NP 08/03/2020 5:27 PM    [facility] [place], [place], [place] [address] Phone: [phone]; Fax: [phone]

## 2020-08-02 NOTE — Telephone Encounter (Signed)
Returned a call to [facility] Nurse; please refer to Anticoagulation Encounter from today for detailed dosing instructions.

## 2020-08-02 NOTE — Telephone Encounter (Signed)
INR 3.3  Per home health nurse Juliane Poot [phone]

## 2020-08-02 NOTE — Patient Instructions (Signed)
Description   Spoke with [facility] and instructed pt to hold today's Warfarin then continue warfarin 1 tablet daily except 1.5 tablets on Saturday. Recheck in 1 week By [facility] call to [facility].

## 2020-08-03 ENCOUNTER — Other Ambulatory Visit: Payer: Self-pay

## 2020-08-03 ENCOUNTER — Ambulatory Visit (INDEPENDENT_AMBULATORY_CARE_PROVIDER_SITE_OTHER): Payer: Medicare Other | Admitting: Family Medicine

## 2020-08-03 ENCOUNTER — Encounter: Payer: Self-pay | Admitting: Family Medicine

## 2020-08-03 VITALS — BP 136/64 | HR 55 | Ht 65.5 in | Wt 176.6 lb

## 2020-08-03 DIAGNOSIS — I350 Nonrheumatic aortic (valve) stenosis: Secondary | ICD-10-CM

## 2020-08-03 DIAGNOSIS — I2111 ST elevation (STEMI) myocardial infarction involving right coronary artery: Secondary | ICD-10-CM | POA: Diagnosis not present

## 2020-08-03 DIAGNOSIS — Z86711 Personal history of pulmonary embolism: Secondary | ICD-10-CM | POA: Diagnosis not present

## 2020-08-03 DIAGNOSIS — E782 Mixed hyperlipidemia: Secondary | ICD-10-CM | POA: Diagnosis not present

## 2020-08-03 DIAGNOSIS — E1100 Type 2 diabetes mellitus with hyperosmolarity without nonketotic hyperglycemic-hyperosmolar coma (NKHHC): Secondary | ICD-10-CM

## 2020-08-03 DIAGNOSIS — I10 Essential (primary) hypertension: Secondary | ICD-10-CM | POA: Diagnosis not present

## 2020-08-03 DIAGNOSIS — J810 Acute pulmonary edema: Secondary | ICD-10-CM

## 2020-08-03 MED ORDER — NITROGLYCERIN 0.4 MG SL SUBL: 0.4000 mg | SUBLINGUAL_TABLET | SUBLINGUAL | 3 refills | Status: DC | PRN

## 2020-08-03 NOTE — Patient Instructions (Signed)
Medication Instructions:  Continue all current medications.  Labwork: none  Testing/Procedures: none  Follow-Up: 3 months   Any Other Special Instructions Will Be Listed Below (If Applicable).  If you need a refill on your cardiac medications before your next appointment, please call your pharmacy.  

## 2020-08-04 DIAGNOSIS — I130 Hypertensive heart and chronic kidney disease with heart failure and stage 1 through stage 4 chronic kidney disease, or unspecified chronic kidney disease: Secondary | ICD-10-CM | POA: Diagnosis not present

## 2020-08-04 DIAGNOSIS — I5031 Acute diastolic (congestive) heart failure: Secondary | ICD-10-CM | POA: Diagnosis not present

## 2020-08-04 DIAGNOSIS — I2510 Atherosclerotic heart disease of native coronary artery without angina pectoris: Secondary | ICD-10-CM | POA: Diagnosis not present

## 2020-08-04 DIAGNOSIS — D631 Anemia in chronic kidney disease: Secondary | ICD-10-CM | POA: Diagnosis not present

## 2020-08-04 DIAGNOSIS — J9601 Acute respiratory failure with hypoxia: Secondary | ICD-10-CM | POA: Diagnosis not present

## 2020-08-04 DIAGNOSIS — Z955 Presence of coronary angioplasty implant and graft: Secondary | ICD-10-CM | POA: Diagnosis not present

## 2020-08-04 DIAGNOSIS — E1122 Type 2 diabetes mellitus with diabetic chronic kidney disease: Secondary | ICD-10-CM | POA: Diagnosis not present

## 2020-08-04 DIAGNOSIS — I2111 ST elevation (STEMI) myocardial infarction involving right coronary artery: Secondary | ICD-10-CM | POA: Diagnosis not present

## 2020-08-04 DIAGNOSIS — N183 Chronic kidney disease, stage 3 unspecified: Secondary | ICD-10-CM | POA: Diagnosis not present

## 2020-08-04 DIAGNOSIS — E785 Hyperlipidemia, unspecified: Secondary | ICD-10-CM | POA: Diagnosis not present

## 2020-08-04 DIAGNOSIS — I350 Nonrheumatic aortic (valve) stenosis: Secondary | ICD-10-CM | POA: Diagnosis not present

## 2020-08-07 ENCOUNTER — Telehealth: Payer: Self-pay | Admitting: *Deleted

## 2020-08-07 DIAGNOSIS — D631 Anemia in chronic kidney disease: Secondary | ICD-10-CM | POA: Diagnosis not present

## 2020-08-07 DIAGNOSIS — E1122 Type 2 diabetes mellitus with diabetic chronic kidney disease: Secondary | ICD-10-CM | POA: Diagnosis not present

## 2020-08-07 DIAGNOSIS — N183 Chronic kidney disease, stage 3 unspecified: Secondary | ICD-10-CM | POA: Diagnosis not present

## 2020-08-07 DIAGNOSIS — I130 Hypertensive heart and chronic kidney disease with heart failure and stage 1 through stage 4 chronic kidney disease, or unspecified chronic kidney disease: Secondary | ICD-10-CM | POA: Diagnosis not present

## 2020-08-07 DIAGNOSIS — I2111 ST elevation (STEMI) myocardial infarction involving right coronary artery: Secondary | ICD-10-CM | POA: Diagnosis not present

## 2020-08-07 DIAGNOSIS — I5031 Acute diastolic (congestive) heart failure: Secondary | ICD-10-CM | POA: Diagnosis not present

## 2020-08-07 NOTE — Telephone Encounter (Signed)
Patient seen on 08/03/2020 by Katina Dung, NP - after further review of chart he would like for patient to:   Decrease Coreg to 3.125mg  twice a day  Begin Losartan 12.5mg  daily.

## 2020-08-07 NOTE — Telephone Encounter (Signed)
Left message to return call 

## 2020-08-08 ENCOUNTER — Telehealth: Payer: Self-pay | Admitting: *Deleted

## 2020-08-08 ENCOUNTER — Ambulatory Visit (INDEPENDENT_AMBULATORY_CARE_PROVIDER_SITE_OTHER): Payer: Medicare Other | Admitting: *Deleted

## 2020-08-08 DIAGNOSIS — I4892 Unspecified atrial flutter: Secondary | ICD-10-CM

## 2020-08-08 DIAGNOSIS — I130 Hypertensive heart and chronic kidney disease with heart failure and stage 1 through stage 4 chronic kidney disease, or unspecified chronic kidney disease: Secondary | ICD-10-CM | POA: Diagnosis not present

## 2020-08-08 DIAGNOSIS — Z5181 Encounter for therapeutic drug level monitoring: Secondary | ICD-10-CM

## 2020-08-08 DIAGNOSIS — E1122 Type 2 diabetes mellitus with diabetic chronic kidney disease: Secondary | ICD-10-CM | POA: Diagnosis not present

## 2020-08-08 DIAGNOSIS — N183 Chronic kidney disease, stage 3 unspecified: Secondary | ICD-10-CM | POA: Diagnosis not present

## 2020-08-08 DIAGNOSIS — D631 Anemia in chronic kidney disease: Secondary | ICD-10-CM | POA: Diagnosis not present

## 2020-08-08 DIAGNOSIS — I2111 ST elevation (STEMI) myocardial infarction involving right coronary artery: Secondary | ICD-10-CM | POA: Diagnosis not present

## 2020-08-08 DIAGNOSIS — I5031 Acute diastolic (congestive) heart failure: Secondary | ICD-10-CM | POA: Diagnosis not present

## 2020-08-08 LAB — POCT INR: INR: 1.6 — AB (ref 2.0–3.0)

## 2020-08-08 NOTE — Telephone Encounter (Signed)
INR 1.6   Per Steffanie Dunn nurse w/ [facility]   [phone]

## 2020-08-08 NOTE — Telephone Encounter (Signed)
Done.  Burman Freestone for Kristi with coumadin orders.  See coumadin note.

## 2020-08-08 NOTE — Patient Instructions (Signed)
Take warfarin 1 1/2 tablets total today and tomorrow then resume 1 tablet daily except 1.5 tablets on Saturday. Recheck in 1 week  Order given to [facility].

## 2020-08-09 DIAGNOSIS — N183 Chronic kidney disease, stage 3 unspecified: Secondary | ICD-10-CM | POA: Diagnosis not present

## 2020-08-09 DIAGNOSIS — I5031 Acute diastolic (congestive) heart failure: Secondary | ICD-10-CM | POA: Diagnosis not present

## 2020-08-09 DIAGNOSIS — I2111 ST elevation (STEMI) myocardial infarction involving right coronary artery: Secondary | ICD-10-CM | POA: Diagnosis not present

## 2020-08-09 DIAGNOSIS — E1122 Type 2 diabetes mellitus with diabetic chronic kidney disease: Secondary | ICD-10-CM | POA: Diagnosis not present

## 2020-08-09 DIAGNOSIS — I130 Hypertensive heart and chronic kidney disease with heart failure and stage 1 through stage 4 chronic kidney disease, or unspecified chronic kidney disease: Secondary | ICD-10-CM | POA: Diagnosis not present

## 2020-08-09 DIAGNOSIS — D631 Anemia in chronic kidney disease: Secondary | ICD-10-CM | POA: Diagnosis not present

## 2020-08-10 MED ORDER — LOSARTAN POTASSIUM 25 MG PO TABS: 12.5000 mg | ORAL_TABLET | Freq: Every day | ORAL | 6 refills | Status: DC

## 2020-08-10 MED ORDER — CARVEDILOL 3.125 MG PO TABS
3.1250 mg | ORAL_TABLET | Freq: Two times a day (BID) | ORAL | 6 refills | Status: DC
Start: 2020-08-10 — End: 2020-10-03

## 2020-08-10 NOTE — Telephone Encounter (Signed)
Pam (daughter) notified & verbalized understanding.  Will send new prescriptions to [facility] now.

## 2020-08-15 ENCOUNTER — Ambulatory Visit (INDEPENDENT_AMBULATORY_CARE_PROVIDER_SITE_OTHER): Payer: Medicare Other | Admitting: *Deleted

## 2020-08-15 ENCOUNTER — Telehealth: Payer: Self-pay | Admitting: Family Medicine

## 2020-08-15 DIAGNOSIS — N183 Chronic kidney disease, stage 3 unspecified: Secondary | ICD-10-CM | POA: Diagnosis not present

## 2020-08-15 DIAGNOSIS — I4892 Unspecified atrial flutter: Secondary | ICD-10-CM

## 2020-08-15 DIAGNOSIS — I130 Hypertensive heart and chronic kidney disease with heart failure and stage 1 through stage 4 chronic kidney disease, or unspecified chronic kidney disease: Secondary | ICD-10-CM | POA: Diagnosis not present

## 2020-08-15 DIAGNOSIS — I2111 ST elevation (STEMI) myocardial infarction involving right coronary artery: Secondary | ICD-10-CM | POA: Diagnosis not present

## 2020-08-15 DIAGNOSIS — D631 Anemia in chronic kidney disease: Secondary | ICD-10-CM | POA: Diagnosis not present

## 2020-08-15 DIAGNOSIS — E1122 Type 2 diabetes mellitus with diabetic chronic kidney disease: Secondary | ICD-10-CM | POA: Diagnosis not present

## 2020-08-15 DIAGNOSIS — Z5181 Encounter for therapeutic drug level monitoring: Secondary | ICD-10-CM

## 2020-08-15 DIAGNOSIS — I5031 Acute diastolic (congestive) heart failure: Secondary | ICD-10-CM | POA: Diagnosis not present

## 2020-08-15 LAB — POCT INR
INR: 1.6 — AB (ref 2.0–3.0)
INR: 1.6 — AB (ref 2.0–3.0)

## 2020-08-15 MED ORDER — DILTIAZEM HCL 30 MG PO TABS: 30.0000 mg | ORAL_TABLET | Freq: Two times a day (BID) | ORAL | 0 refills | Status: DC | PRN

## 2020-08-15 NOTE — Telephone Encounter (Signed)
Pt daughter says pt c/o feeling her heart flutter/palpitations off and on HR remains in the 90s BP 118/60 - denies SOB/dizziness/chest pain/swelling/weight gain - currently taking coreg 3.125 mg bid

## 2020-08-15 NOTE — Patient Instructions (Signed)
Take warfarin 2 tablets total today then increase dose to 1 tablet daily except 1.5 tablets on Tuesdays and Saturday. Recheck in 1 week  Order given to [facility].

## 2020-08-15 NOTE — Telephone Encounter (Signed)
She is probably going in and out of atrial fibrillation. Start Cardizem 30 mg po twice as needed for palpitations. If she does not feel palpitations do not take. Tell her to always check her heart rate and BP prior to taking the medication.  If Systolic BP > 166 with palpitations and heart rate greater than 80 to take the medication. This should help.

## 2020-08-15 NOTE — Telephone Encounter (Signed)
Sorry that is twice a day only as needed

## 2020-08-15 NOTE — Telephone Encounter (Signed)
Daughter [name]) called stating that she has questions and concerns about her mother's diagnosis. States that her heart continues to flutter.

## 2020-08-15 NOTE — Telephone Encounter (Signed)
Pt daughter voiced understanding - Medication sent to pharmacy.

## 2020-08-18 DIAGNOSIS — E1122 Type 2 diabetes mellitus with diabetic chronic kidney disease: Secondary | ICD-10-CM | POA: Diagnosis not present

## 2020-08-18 DIAGNOSIS — D631 Anemia in chronic kidney disease: Secondary | ICD-10-CM | POA: Diagnosis not present

## 2020-08-18 DIAGNOSIS — N183 Chronic kidney disease, stage 3 unspecified: Secondary | ICD-10-CM | POA: Diagnosis not present

## 2020-08-18 DIAGNOSIS — I130 Hypertensive heart and chronic kidney disease with heart failure and stage 1 through stage 4 chronic kidney disease, or unspecified chronic kidney disease: Secondary | ICD-10-CM | POA: Diagnosis not present

## 2020-08-18 DIAGNOSIS — I2111 ST elevation (STEMI) myocardial infarction involving right coronary artery: Secondary | ICD-10-CM | POA: Diagnosis not present

## 2020-08-18 DIAGNOSIS — I5031 Acute diastolic (congestive) heart failure: Secondary | ICD-10-CM | POA: Diagnosis not present

## 2020-08-22 ENCOUNTER — Telehealth: Payer: Self-pay | Admitting: *Deleted

## 2020-08-22 ENCOUNTER — Ambulatory Visit (INDEPENDENT_AMBULATORY_CARE_PROVIDER_SITE_OTHER): Payer: Medicare Other | Admitting: *Deleted

## 2020-08-22 DIAGNOSIS — N183 Chronic kidney disease, stage 3 unspecified: Secondary | ICD-10-CM | POA: Diagnosis not present

## 2020-08-22 DIAGNOSIS — Z5181 Encounter for therapeutic drug level monitoring: Secondary | ICD-10-CM

## 2020-08-22 DIAGNOSIS — I4892 Unspecified atrial flutter: Secondary | ICD-10-CM

## 2020-08-22 DIAGNOSIS — I130 Hypertensive heart and chronic kidney disease with heart failure and stage 1 through stage 4 chronic kidney disease, or unspecified chronic kidney disease: Secondary | ICD-10-CM | POA: Diagnosis not present

## 2020-08-22 DIAGNOSIS — E1122 Type 2 diabetes mellitus with diabetic chronic kidney disease: Secondary | ICD-10-CM | POA: Diagnosis not present

## 2020-08-22 DIAGNOSIS — I5031 Acute diastolic (congestive) heart failure: Secondary | ICD-10-CM | POA: Diagnosis not present

## 2020-08-22 DIAGNOSIS — D631 Anemia in chronic kidney disease: Secondary | ICD-10-CM | POA: Diagnosis not present

## 2020-08-22 DIAGNOSIS — I2111 ST elevation (STEMI) myocardial infarction involving right coronary artery: Secondary | ICD-10-CM | POA: Diagnosis not present

## 2020-08-22 LAB — POCT INR: INR: 3.5 — AB (ref 2.0–3.0)

## 2020-08-22 NOTE — Telephone Encounter (Signed)
Janett Billow, nurse w/ [phone]   INR 3.5   PT 42.5

## 2020-08-22 NOTE — Patient Instructions (Signed)
Hold warfarin today then decrease dose to 1 tablet daily except 1.5 tablets on Saturdays. Recheck in 1 week  Order given to [facility].

## 2020-08-22 NOTE — Telephone Encounter (Signed)
Done.  See coumadin note. 

## 2020-08-23 DIAGNOSIS — Z7901 Long term (current) use of anticoagulants: Secondary | ICD-10-CM | POA: Diagnosis not present

## 2020-08-23 DIAGNOSIS — I2510 Atherosclerotic heart disease of native coronary artery without angina pectoris: Secondary | ICD-10-CM | POA: Diagnosis not present

## 2020-08-23 DIAGNOSIS — N183 Chronic kidney disease, stage 3 unspecified: Secondary | ICD-10-CM | POA: Diagnosis not present

## 2020-08-23 DIAGNOSIS — I2111 ST elevation (STEMI) myocardial infarction involving right coronary artery: Secondary | ICD-10-CM | POA: Diagnosis not present

## 2020-08-23 DIAGNOSIS — I5031 Acute diastolic (congestive) heart failure: Secondary | ICD-10-CM | POA: Diagnosis not present

## 2020-08-23 DIAGNOSIS — Z7982 Long term (current) use of aspirin: Secondary | ICD-10-CM | POA: Diagnosis not present

## 2020-08-23 DIAGNOSIS — J9601 Acute respiratory failure with hypoxia: Secondary | ICD-10-CM | POA: Diagnosis not present

## 2020-08-23 DIAGNOSIS — I350 Nonrheumatic aortic (valve) stenosis: Secondary | ICD-10-CM | POA: Diagnosis not present

## 2020-08-23 DIAGNOSIS — Z7902 Long term (current) use of antithrombotics/antiplatelets: Secondary | ICD-10-CM | POA: Diagnosis not present

## 2020-08-23 DIAGNOSIS — E785 Hyperlipidemia, unspecified: Secondary | ICD-10-CM | POA: Diagnosis not present

## 2020-08-23 DIAGNOSIS — E1122 Type 2 diabetes mellitus with diabetic chronic kidney disease: Secondary | ICD-10-CM | POA: Diagnosis not present

## 2020-08-23 DIAGNOSIS — D631 Anemia in chronic kidney disease: Secondary | ICD-10-CM | POA: Diagnosis not present

## 2020-08-23 DIAGNOSIS — Z955 Presence of coronary angioplasty implant and graft: Secondary | ICD-10-CM | POA: Diagnosis not present

## 2020-08-23 DIAGNOSIS — Z86711 Personal history of pulmonary embolism: Secondary | ICD-10-CM | POA: Diagnosis not present

## 2020-08-23 DIAGNOSIS — I130 Hypertensive heart and chronic kidney disease with heart failure and stage 1 through stage 4 chronic kidney disease, or unspecified chronic kidney disease: Secondary | ICD-10-CM | POA: Diagnosis not present

## 2020-08-23 DIAGNOSIS — Z7984 Long term (current) use of oral hypoglycemic drugs: Secondary | ICD-10-CM | POA: Diagnosis not present

## 2020-08-24 DIAGNOSIS — I5031 Acute diastolic (congestive) heart failure: Secondary | ICD-10-CM | POA: Diagnosis not present

## 2020-08-24 DIAGNOSIS — N183 Chronic kidney disease, stage 3 unspecified: Secondary | ICD-10-CM | POA: Diagnosis not present

## 2020-08-24 DIAGNOSIS — I130 Hypertensive heart and chronic kidney disease with heart failure and stage 1 through stage 4 chronic kidney disease, or unspecified chronic kidney disease: Secondary | ICD-10-CM | POA: Diagnosis not present

## 2020-08-24 DIAGNOSIS — D631 Anemia in chronic kidney disease: Secondary | ICD-10-CM | POA: Diagnosis not present

## 2020-08-24 DIAGNOSIS — E1122 Type 2 diabetes mellitus with diabetic chronic kidney disease: Secondary | ICD-10-CM | POA: Diagnosis not present

## 2020-08-24 DIAGNOSIS — I2111 ST elevation (STEMI) myocardial infarction involving right coronary artery: Secondary | ICD-10-CM | POA: Diagnosis not present

## 2020-08-28 DIAGNOSIS — D631 Anemia in chronic kidney disease: Secondary | ICD-10-CM | POA: Diagnosis not present

## 2020-08-28 DIAGNOSIS — I130 Hypertensive heart and chronic kidney disease with heart failure and stage 1 through stage 4 chronic kidney disease, or unspecified chronic kidney disease: Secondary | ICD-10-CM | POA: Diagnosis not present

## 2020-08-28 DIAGNOSIS — E1122 Type 2 diabetes mellitus with diabetic chronic kidney disease: Secondary | ICD-10-CM | POA: Diagnosis not present

## 2020-08-28 DIAGNOSIS — I5031 Acute diastolic (congestive) heart failure: Secondary | ICD-10-CM | POA: Diagnosis not present

## 2020-08-28 DIAGNOSIS — I2111 ST elevation (STEMI) myocardial infarction involving right coronary artery: Secondary | ICD-10-CM | POA: Diagnosis not present

## 2020-08-28 DIAGNOSIS — N183 Chronic kidney disease, stage 3 unspecified: Secondary | ICD-10-CM | POA: Diagnosis not present

## 2020-08-29 ENCOUNTER — Ambulatory Visit (INDEPENDENT_AMBULATORY_CARE_PROVIDER_SITE_OTHER): Payer: Medicare Other | Admitting: *Deleted

## 2020-08-29 DIAGNOSIS — Z5181 Encounter for therapeutic drug level monitoring: Secondary | ICD-10-CM | POA: Diagnosis not present

## 2020-08-29 DIAGNOSIS — I4892 Unspecified atrial flutter: Secondary | ICD-10-CM

## 2020-08-29 DIAGNOSIS — N183 Chronic kidney disease, stage 3 unspecified: Secondary | ICD-10-CM | POA: Diagnosis not present

## 2020-08-29 DIAGNOSIS — E1122 Type 2 diabetes mellitus with diabetic chronic kidney disease: Secondary | ICD-10-CM | POA: Diagnosis not present

## 2020-08-29 DIAGNOSIS — D631 Anemia in chronic kidney disease: Secondary | ICD-10-CM | POA: Diagnosis not present

## 2020-08-29 DIAGNOSIS — I130 Hypertensive heart and chronic kidney disease with heart failure and stage 1 through stage 4 chronic kidney disease, or unspecified chronic kidney disease: Secondary | ICD-10-CM | POA: Diagnosis not present

## 2020-08-29 DIAGNOSIS — I2111 ST elevation (STEMI) myocardial infarction involving right coronary artery: Secondary | ICD-10-CM | POA: Diagnosis not present

## 2020-08-29 DIAGNOSIS — I5031 Acute diastolic (congestive) heart failure: Secondary | ICD-10-CM | POA: Diagnosis not present

## 2020-08-29 LAB — POCT INR: INR: 2.4 (ref 2.0–3.0)

## 2020-08-29 NOTE — Patient Instructions (Signed)
Continue warfarin 1 tablet daily except 1.5 tablets on Saturdays. Recheck in 1 week  Order given to [facility].

## 2020-08-30 DIAGNOSIS — D631 Anemia in chronic kidney disease: Secondary | ICD-10-CM | POA: Diagnosis not present

## 2020-08-30 DIAGNOSIS — I2111 ST elevation (STEMI) myocardial infarction involving right coronary artery: Secondary | ICD-10-CM | POA: Diagnosis not present

## 2020-08-30 DIAGNOSIS — I5031 Acute diastolic (congestive) heart failure: Secondary | ICD-10-CM | POA: Diagnosis not present

## 2020-08-30 DIAGNOSIS — E1122 Type 2 diabetes mellitus with diabetic chronic kidney disease: Secondary | ICD-10-CM | POA: Diagnosis not present

## 2020-08-30 DIAGNOSIS — I130 Hypertensive heart and chronic kidney disease with heart failure and stage 1 through stage 4 chronic kidney disease, or unspecified chronic kidney disease: Secondary | ICD-10-CM | POA: Diagnosis not present

## 2020-08-30 DIAGNOSIS — N183 Chronic kidney disease, stage 3 unspecified: Secondary | ICD-10-CM | POA: Diagnosis not present

## 2020-09-05 ENCOUNTER — Ambulatory Visit (INDEPENDENT_AMBULATORY_CARE_PROVIDER_SITE_OTHER): Payer: Medicare Other | Admitting: *Deleted

## 2020-09-05 ENCOUNTER — Telehealth: Payer: Self-pay | Admitting: Cardiology

## 2020-09-05 DIAGNOSIS — N183 Chronic kidney disease, stage 3 unspecified: Secondary | ICD-10-CM | POA: Diagnosis not present

## 2020-09-05 DIAGNOSIS — I130 Hypertensive heart and chronic kidney disease with heart failure and stage 1 through stage 4 chronic kidney disease, or unspecified chronic kidney disease: Secondary | ICD-10-CM | POA: Diagnosis not present

## 2020-09-05 DIAGNOSIS — D631 Anemia in chronic kidney disease: Secondary | ICD-10-CM | POA: Diagnosis not present

## 2020-09-05 DIAGNOSIS — Z5181 Encounter for therapeutic drug level monitoring: Secondary | ICD-10-CM

## 2020-09-05 DIAGNOSIS — I2111 ST elevation (STEMI) myocardial infarction involving right coronary artery: Secondary | ICD-10-CM | POA: Diagnosis not present

## 2020-09-05 DIAGNOSIS — E785 Hyperlipidemia, unspecified: Secondary | ICD-10-CM | POA: Diagnosis not present

## 2020-09-05 DIAGNOSIS — I5031 Acute diastolic (congestive) heart failure: Secondary | ICD-10-CM | POA: Diagnosis not present

## 2020-09-05 DIAGNOSIS — E1122 Type 2 diabetes mellitus with diabetic chronic kidney disease: Secondary | ICD-10-CM | POA: Diagnosis not present

## 2020-09-05 DIAGNOSIS — I4892 Unspecified atrial flutter: Secondary | ICD-10-CM

## 2020-09-05 DIAGNOSIS — Z8673 Personal history of transient ischemic attack (TIA), and cerebral infarction without residual deficits: Secondary | ICD-10-CM | POA: Diagnosis not present

## 2020-09-05 DIAGNOSIS — I10 Essential (primary) hypertension: Secondary | ICD-10-CM | POA: Diagnosis not present

## 2020-09-05 DIAGNOSIS — R5383 Other fatigue: Secondary | ICD-10-CM | POA: Diagnosis not present

## 2020-09-05 DIAGNOSIS — F0150 Vascular dementia without behavioral disturbance: Secondary | ICD-10-CM | POA: Diagnosis not present

## 2020-09-05 DIAGNOSIS — I350 Nonrheumatic aortic (valve) stenosis: Secondary | ICD-10-CM | POA: Diagnosis not present

## 2020-09-05 DIAGNOSIS — I482 Chronic atrial fibrillation, unspecified: Secondary | ICD-10-CM | POA: Diagnosis not present

## 2020-09-05 DIAGNOSIS — D508 Other iron deficiency anemias: Secondary | ICD-10-CM | POA: Diagnosis not present

## 2020-09-05 DIAGNOSIS — E782 Mixed hyperlipidemia: Secondary | ICD-10-CM | POA: Diagnosis not present

## 2020-09-05 DIAGNOSIS — N1832 Chronic kidney disease, stage 3b: Secondary | ICD-10-CM | POA: Diagnosis not present

## 2020-09-05 DIAGNOSIS — D649 Anemia, unspecified: Secondary | ICD-10-CM | POA: Diagnosis not present

## 2020-09-05 DIAGNOSIS — E1165 Type 2 diabetes mellitus with hyperglycemia: Secondary | ICD-10-CM | POA: Diagnosis not present

## 2020-09-05 LAB — POCT INR: INR: 2.6 (ref 2.0–3.0)

## 2020-09-05 NOTE — Telephone Encounter (Signed)
NEW MESSAGE    INR WAS 2.6 AND PT WAS 31

## 2020-09-05 NOTE — Patient Instructions (Signed)
Continue warfarin 1 tablet daily except 1.5 tablets on Saturdays. Recheck in 1 week  Order given to [facility].

## 2020-09-05 NOTE — Telephone Encounter (Signed)
Spoke with [facility].  Coumadin instructions given.  See coumadin note.

## 2020-09-12 ENCOUNTER — Telehealth: Payer: Self-pay | Admitting: *Deleted

## 2020-09-12 ENCOUNTER — Ambulatory Visit (INDEPENDENT_AMBULATORY_CARE_PROVIDER_SITE_OTHER): Payer: Medicare Other | Admitting: *Deleted

## 2020-09-12 DIAGNOSIS — E6609 Other obesity due to excess calories: Secondary | ICD-10-CM | POA: Diagnosis not present

## 2020-09-12 DIAGNOSIS — E782 Mixed hyperlipidemia: Secondary | ICD-10-CM | POA: Diagnosis not present

## 2020-09-12 DIAGNOSIS — I130 Hypertensive heart and chronic kidney disease with heart failure and stage 1 through stage 4 chronic kidney disease, or unspecified chronic kidney disease: Secondary | ICD-10-CM | POA: Diagnosis not present

## 2020-09-12 DIAGNOSIS — N183 Chronic kidney disease, stage 3 unspecified: Secondary | ICD-10-CM | POA: Diagnosis not present

## 2020-09-12 DIAGNOSIS — E1122 Type 2 diabetes mellitus with diabetic chronic kidney disease: Secondary | ICD-10-CM | POA: Diagnosis not present

## 2020-09-12 DIAGNOSIS — I129 Hypertensive chronic kidney disease with stage 1 through stage 4 chronic kidney disease, or unspecified chronic kidney disease: Secondary | ICD-10-CM | POA: Diagnosis not present

## 2020-09-12 DIAGNOSIS — E1165 Type 2 diabetes mellitus with hyperglycemia: Secondary | ICD-10-CM | POA: Diagnosis not present

## 2020-09-12 DIAGNOSIS — D631 Anemia in chronic kidney disease: Secondary | ICD-10-CM | POA: Diagnosis not present

## 2020-09-12 DIAGNOSIS — Z5181 Encounter for therapeutic drug level monitoring: Secondary | ICD-10-CM | POA: Diagnosis not present

## 2020-09-12 DIAGNOSIS — I5033 Acute on chronic diastolic (congestive) heart failure: Secondary | ICD-10-CM | POA: Diagnosis not present

## 2020-09-12 DIAGNOSIS — F0150 Vascular dementia without behavioral disturbance: Secondary | ICD-10-CM | POA: Diagnosis not present

## 2020-09-12 DIAGNOSIS — I5031 Acute diastolic (congestive) heart failure: Secondary | ICD-10-CM | POA: Diagnosis not present

## 2020-09-12 DIAGNOSIS — N1832 Chronic kidney disease, stage 3b: Secondary | ICD-10-CM | POA: Diagnosis not present

## 2020-09-12 DIAGNOSIS — I2111 ST elevation (STEMI) myocardial infarction involving right coronary artery: Secondary | ICD-10-CM | POA: Diagnosis not present

## 2020-09-12 DIAGNOSIS — Z6830 Body mass index (BMI) 30.0-30.9, adult: Secondary | ICD-10-CM | POA: Diagnosis not present

## 2020-09-12 DIAGNOSIS — I482 Chronic atrial fibrillation, unspecified: Secondary | ICD-10-CM | POA: Diagnosis not present

## 2020-09-12 DIAGNOSIS — Z8673 Personal history of transient ischemic attack (TIA), and cerebral infarction without residual deficits: Secondary | ICD-10-CM | POA: Diagnosis not present

## 2020-09-12 DIAGNOSIS — I4892 Unspecified atrial flutter: Secondary | ICD-10-CM | POA: Diagnosis not present

## 2020-09-12 LAB — POCT INR: INR: 2.4 (ref 2.0–3.0)

## 2020-09-12 NOTE — Patient Instructions (Signed)
Continue warfarin 1 tablet daily except 1.5 tablets on Saturdays. Recheck in 2 weeks Order given to [facility].

## 2020-09-12 NOTE — Telephone Encounter (Signed)
INR 2.4 PT 28.5   Per Janett Billow w/ [facility]

## 2020-09-19 ENCOUNTER — Telehealth: Payer: Self-pay | Admitting: Cardiology

## 2020-09-19 ENCOUNTER — Ambulatory Visit (INDEPENDENT_AMBULATORY_CARE_PROVIDER_SITE_OTHER): Payer: Medicare Other | Admitting: *Deleted

## 2020-09-19 DIAGNOSIS — I5031 Acute diastolic (congestive) heart failure: Secondary | ICD-10-CM | POA: Diagnosis not present

## 2020-09-19 DIAGNOSIS — I2111 ST elevation (STEMI) myocardial infarction involving right coronary artery: Secondary | ICD-10-CM | POA: Diagnosis not present

## 2020-09-19 DIAGNOSIS — I130 Hypertensive heart and chronic kidney disease with heart failure and stage 1 through stage 4 chronic kidney disease, or unspecified chronic kidney disease: Secondary | ICD-10-CM | POA: Diagnosis not present

## 2020-09-19 DIAGNOSIS — N183 Chronic kidney disease, stage 3 unspecified: Secondary | ICD-10-CM | POA: Diagnosis not present

## 2020-09-19 DIAGNOSIS — E1122 Type 2 diabetes mellitus with diabetic chronic kidney disease: Secondary | ICD-10-CM | POA: Diagnosis not present

## 2020-09-19 DIAGNOSIS — D631 Anemia in chronic kidney disease: Secondary | ICD-10-CM | POA: Diagnosis not present

## 2020-09-19 LAB — POCT INR: INR: 2.3 (ref 2.0–3.0)

## 2020-09-19 MED ORDER — FUROSEMIDE 20 MG PO TABS: 20.0000 mg | ORAL_TABLET | Freq: Every day | ORAL | 3 refills | Status: DC

## 2020-09-19 NOTE — Telephone Encounter (Signed)
I reviewed the lab work and commented on the report.

## 2020-09-19 NOTE — Telephone Encounter (Signed)
Per phone call from pt's daughter, Larkin Ina from Dr. Juel Burrow office had lab work done on this pt last week and she's gone into stage 4 kidney failure and [place] is wanting her Cardiologists to adjust her lasix    Please call Pam [phone]

## 2020-09-19 NOTE — Telephone Encounter (Signed)
Labs scanned from [place] office. Will forward to Dr.McDowell. Daughter questions if lasix dosing needs to be changed.

## 2020-09-19 NOTE — Patient Instructions (Signed)
Continue warfarin 1 tablet daily except 1.5 tablets on Saturdays. Recheck in 2 weeks Order given to [facility].

## 2020-09-19 NOTE — Telephone Encounter (Signed)
Caitlin Sark, MD   Results reviewed. Lab work forwarded to me from Dr. Juel Burrow office. I reviewed the chart. Patient was recently seen by Mr. Leonides Sake NP and apparently scheduled to follow-up with me, she had formally been a patient of Dr. Harrington Challenger. I reviewed her hospitalization and recent medications. Creatinine is 1.68 with GFR of 28. It looks like she is on Lasix 40 mg daily per chart review, I would suggest that she stop Lasix for the next few days and then reduce the dose to 20 mg daily. Her LVEF was 45 to 50% by echocardiogram in October. I do not have a good sense as to whether she is having any issues with fluid retention or whether she might be able to take the Lasix on an as-needed basis instead. I see that her scheduled visit with me is in January. If needed, this could be moved up if there are further questions in the interim.        I spoke with daughter Pam.They will hold Lasix for 3 days ,and then on 09/23/20 , reduce Lasix dose to 20 mg daily.

## 2020-09-19 NOTE — Telephone Encounter (Signed)
I spoke with Dr.Hall's office, await labs, their computer is down, will fax as soon as they can.

## 2020-09-22 DIAGNOSIS — D631 Anemia in chronic kidney disease: Secondary | ICD-10-CM | POA: Diagnosis not present

## 2020-09-22 DIAGNOSIS — Z86711 Personal history of pulmonary embolism: Secondary | ICD-10-CM | POA: Diagnosis not present

## 2020-09-22 DIAGNOSIS — Z7901 Long term (current) use of anticoagulants: Secondary | ICD-10-CM | POA: Diagnosis not present

## 2020-09-22 DIAGNOSIS — Z7902 Long term (current) use of antithrombotics/antiplatelets: Secondary | ICD-10-CM | POA: Diagnosis not present

## 2020-09-22 DIAGNOSIS — E113292 Type 2 diabetes mellitus with mild nonproliferative diabetic retinopathy without macular edema, left eye: Secondary | ICD-10-CM | POA: Diagnosis not present

## 2020-09-22 DIAGNOSIS — I2510 Atherosclerotic heart disease of native coronary artery without angina pectoris: Secondary | ICD-10-CM | POA: Diagnosis not present

## 2020-09-22 DIAGNOSIS — Z7984 Long term (current) use of oral hypoglycemic drugs: Secondary | ICD-10-CM | POA: Diagnosis not present

## 2020-09-22 DIAGNOSIS — I130 Hypertensive heart and chronic kidney disease with heart failure and stage 1 through stage 4 chronic kidney disease, or unspecified chronic kidney disease: Secondary | ICD-10-CM | POA: Diagnosis not present

## 2020-09-22 DIAGNOSIS — I350 Nonrheumatic aortic (valve) stenosis: Secondary | ICD-10-CM | POA: Diagnosis not present

## 2020-09-22 DIAGNOSIS — Z7982 Long term (current) use of aspirin: Secondary | ICD-10-CM | POA: Diagnosis not present

## 2020-09-22 DIAGNOSIS — Z955 Presence of coronary angioplasty implant and graft: Secondary | ICD-10-CM | POA: Diagnosis not present

## 2020-09-22 DIAGNOSIS — I252 Old myocardial infarction: Secondary | ICD-10-CM | POA: Diagnosis not present

## 2020-09-22 DIAGNOSIS — E1122 Type 2 diabetes mellitus with diabetic chronic kidney disease: Secondary | ICD-10-CM | POA: Diagnosis not present

## 2020-09-22 DIAGNOSIS — I4891 Unspecified atrial fibrillation: Secondary | ICD-10-CM | POA: Diagnosis not present

## 2020-09-22 DIAGNOSIS — N183 Chronic kidney disease, stage 3 unspecified: Secondary | ICD-10-CM | POA: Diagnosis not present

## 2020-09-22 DIAGNOSIS — Z5181 Encounter for therapeutic drug level monitoring: Secondary | ICD-10-CM | POA: Diagnosis not present

## 2020-09-22 DIAGNOSIS — I5031 Acute diastolic (congestive) heart failure: Secondary | ICD-10-CM | POA: Diagnosis not present

## 2020-09-22 DIAGNOSIS — E785 Hyperlipidemia, unspecified: Secondary | ICD-10-CM | POA: Diagnosis not present

## 2020-09-22 DIAGNOSIS — J9601 Acute respiratory failure with hypoxia: Secondary | ICD-10-CM | POA: Diagnosis not present

## 2020-09-26 DIAGNOSIS — I4891 Unspecified atrial fibrillation: Secondary | ICD-10-CM | POA: Diagnosis not present

## 2020-09-26 DIAGNOSIS — I130 Hypertensive heart and chronic kidney disease with heart failure and stage 1 through stage 4 chronic kidney disease, or unspecified chronic kidney disease: Secondary | ICD-10-CM | POA: Diagnosis not present

## 2020-09-26 DIAGNOSIS — I5031 Acute diastolic (congestive) heart failure: Secondary | ICD-10-CM | POA: Diagnosis not present

## 2020-09-26 DIAGNOSIS — N183 Chronic kidney disease, stage 3 unspecified: Secondary | ICD-10-CM | POA: Diagnosis not present

## 2020-09-26 DIAGNOSIS — E1122 Type 2 diabetes mellitus with diabetic chronic kidney disease: Secondary | ICD-10-CM | POA: Diagnosis not present

## 2020-09-26 DIAGNOSIS — D631 Anemia in chronic kidney disease: Secondary | ICD-10-CM | POA: Diagnosis not present

## 2020-10-03 ENCOUNTER — Ambulatory Visit (INDEPENDENT_AMBULATORY_CARE_PROVIDER_SITE_OTHER): Payer: Medicare Other | Admitting: *Deleted

## 2020-10-03 ENCOUNTER — Telehealth: Payer: Self-pay | Admitting: Cardiology

## 2020-10-03 DIAGNOSIS — I5031 Acute diastolic (congestive) heart failure: Secondary | ICD-10-CM | POA: Diagnosis not present

## 2020-10-03 DIAGNOSIS — I130 Hypertensive heart and chronic kidney disease with heart failure and stage 1 through stage 4 chronic kidney disease, or unspecified chronic kidney disease: Secondary | ICD-10-CM | POA: Diagnosis not present

## 2020-10-03 DIAGNOSIS — I4891 Unspecified atrial fibrillation: Secondary | ICD-10-CM | POA: Diagnosis not present

## 2020-10-03 DIAGNOSIS — D631 Anemia in chronic kidney disease: Secondary | ICD-10-CM | POA: Diagnosis not present

## 2020-10-03 DIAGNOSIS — Z5181 Encounter for therapeutic drug level monitoring: Secondary | ICD-10-CM | POA: Diagnosis not present

## 2020-10-03 DIAGNOSIS — E1122 Type 2 diabetes mellitus with diabetic chronic kidney disease: Secondary | ICD-10-CM | POA: Diagnosis not present

## 2020-10-03 DIAGNOSIS — N183 Chronic kidney disease, stage 3 unspecified: Secondary | ICD-10-CM | POA: Diagnosis not present

## 2020-10-03 DIAGNOSIS — I4892 Unspecified atrial flutter: Secondary | ICD-10-CM

## 2020-10-03 LAB — POCT INR: INR: 1.5 — AB (ref 2.0–3.0)

## 2020-10-03 MED ORDER — CARVEDILOL 6.25 MG PO TABS: 6.2500 mg | ORAL_TABLET | Freq: Two times a day (BID) | ORAL | 3 refills | Status: DC

## 2020-10-03 NOTE — Telephone Encounter (Signed)
New message     This was called in by Janett Billow at [facility] Daughter Pam    Pt c/o medication issue:  1. Name of Medication: carvedilol (COREG) 3.125 MG tablet 2. How are you currently taking this medication (dosage and times per day)? 1 tablet 2x daily   3. Are you having a reaction (difficulty breathing--STAT)? no  4. What is your medication issue? Patient states that its not controlling the flutters

## 2020-10-03 NOTE — Telephone Encounter (Signed)
Spoke with [facility]..  Warfarin orders given.  See coumadin note.

## 2020-10-03 NOTE — Telephone Encounter (Signed)
Why don't we try and increase Coreg back to 6.25 mg twice daily, hold losartan.

## 2020-10-03 NOTE — Telephone Encounter (Signed)
Daughter Pam informed of medication changes and voiced understanding. Orders placed

## 2020-10-03 NOTE — Telephone Encounter (Signed)
Returned call to daughter [name]. No answer. Left msg to call back.

## 2020-10-03 NOTE — Telephone Encounter (Signed)
Current VS are as follows, Dec 7 BP 108/62 HR 95, Dec 14 BP 125/70 HR 84, Dec 21 BP 120/60 HR 87.

## 2020-10-03 NOTE — Telephone Encounter (Signed)
I have not yet seen [name] in the office.  I reviewed the chart.  Cardizem is listed as an as needed medication, I would not necessarily give this based on what has been described.  She is on low-dose Coreg, reduced by Mr. Leonides Sake NP at the last office visit due to reported fatigue and bradycardia.  I think it is going to be difficult to know what to do with her medications without having a better sense of what her baseline heart rate actually is, and what these palpitations represent.  If these are escalating symptoms, we should do a 72-hour Zio patch to get more information.

## 2020-10-03 NOTE — Addendum Note (Signed)
Addended by: Levonne Hubert on: 10/03/2020 04:34 PM   Modules accepted: Orders

## 2020-10-03 NOTE — Telephone Encounter (Signed)
PTINR    INR 1.5  PT  18.2

## 2020-10-03 NOTE — Patient Instructions (Signed)
Take warfarin 2 tablets tonight, 1 1/2 tablets tomorrow night then resume 1 tablet daily except 1.5 tablets on Saturdays. Recheck in 1 week Order given to [facility].

## 2020-10-03 NOTE — Telephone Encounter (Signed)
Spoke with Janett Billow and she explained that pt is only feeling palpitations when she exerts herself. Janett Billow states that palpitations do not last all day. Pt is taking Coreg 3.125 mg BID. HHN is unclear if Pt should take Diltiazem for these episodes. Please advise.

## 2020-10-04 DIAGNOSIS — F0150 Vascular dementia without behavioral disturbance: Secondary | ICD-10-CM | POA: Diagnosis not present

## 2020-10-04 DIAGNOSIS — R5383 Other fatigue: Secondary | ICD-10-CM | POA: Diagnosis not present

## 2020-10-04 DIAGNOSIS — D649 Anemia, unspecified: Secondary | ICD-10-CM | POA: Diagnosis not present

## 2020-10-04 DIAGNOSIS — E1165 Type 2 diabetes mellitus with hyperglycemia: Secondary | ICD-10-CM | POA: Diagnosis not present

## 2020-10-04 DIAGNOSIS — Z8673 Personal history of transient ischemic attack (TIA), and cerebral infarction without residual deficits: Secondary | ICD-10-CM | POA: Diagnosis not present

## 2020-10-04 DIAGNOSIS — E785 Hyperlipidemia, unspecified: Secondary | ICD-10-CM | POA: Diagnosis not present

## 2020-10-04 DIAGNOSIS — N1832 Chronic kidney disease, stage 3b: Secondary | ICD-10-CM | POA: Diagnosis not present

## 2020-10-04 DIAGNOSIS — D508 Other iron deficiency anemias: Secondary | ICD-10-CM | POA: Diagnosis not present

## 2020-10-04 DIAGNOSIS — E782 Mixed hyperlipidemia: Secondary | ICD-10-CM | POA: Diagnosis not present

## 2020-10-04 DIAGNOSIS — I10 Essential (primary) hypertension: Secondary | ICD-10-CM | POA: Diagnosis not present

## 2020-10-04 DIAGNOSIS — I350 Nonrheumatic aortic (valve) stenosis: Secondary | ICD-10-CM | POA: Diagnosis not present

## 2020-10-04 DIAGNOSIS — I482 Chronic atrial fibrillation, unspecified: Secondary | ICD-10-CM | POA: Diagnosis not present

## 2020-10-10 ENCOUNTER — Telehealth: Payer: Self-pay | Admitting: *Deleted

## 2020-10-10 ENCOUNTER — Ambulatory Visit (INDEPENDENT_AMBULATORY_CARE_PROVIDER_SITE_OTHER): Payer: Medicare Other | Admitting: *Deleted

## 2020-10-10 DIAGNOSIS — E1122 Type 2 diabetes mellitus with diabetic chronic kidney disease: Secondary | ICD-10-CM | POA: Diagnosis not present

## 2020-10-10 DIAGNOSIS — Z5181 Encounter for therapeutic drug level monitoring: Secondary | ICD-10-CM

## 2020-10-10 DIAGNOSIS — N183 Chronic kidney disease, stage 3 unspecified: Secondary | ICD-10-CM | POA: Diagnosis not present

## 2020-10-10 DIAGNOSIS — I4891 Unspecified atrial fibrillation: Secondary | ICD-10-CM | POA: Diagnosis not present

## 2020-10-10 DIAGNOSIS — D631 Anemia in chronic kidney disease: Secondary | ICD-10-CM | POA: Diagnosis not present

## 2020-10-10 DIAGNOSIS — I130 Hypertensive heart and chronic kidney disease with heart failure and stage 1 through stage 4 chronic kidney disease, or unspecified chronic kidney disease: Secondary | ICD-10-CM | POA: Diagnosis not present

## 2020-10-10 DIAGNOSIS — I5031 Acute diastolic (congestive) heart failure: Secondary | ICD-10-CM | POA: Diagnosis not present

## 2020-10-10 DIAGNOSIS — I4892 Unspecified atrial flutter: Secondary | ICD-10-CM

## 2020-10-10 LAB — POCT INR: INR: 1.7 — AB (ref 2.0–3.0)

## 2020-10-10 NOTE — Telephone Encounter (Signed)
Spoke with [facility],  Coumadin orders given.  See coumadin note.

## 2020-10-10 NOTE — Telephone Encounter (Signed)
INR 1.7 PT 20.4  Call [facility] with [facility] [phone]

## 2020-10-10 NOTE — Patient Instructions (Signed)
Take warfarin 2 tablets tonight then increase dose to 1 tablet daily except 1.5 tablets on Tuesdays, Thursdays and Saturdays. Recheck in 1 week Order given to [facility].

## 2020-10-17 ENCOUNTER — Telehealth: Payer: Self-pay | Admitting: *Deleted

## 2020-10-17 ENCOUNTER — Ambulatory Visit (INDEPENDENT_AMBULATORY_CARE_PROVIDER_SITE_OTHER): Payer: Medicare Other | Admitting: *Deleted

## 2020-10-17 DIAGNOSIS — I5031 Acute diastolic (congestive) heart failure: Secondary | ICD-10-CM | POA: Diagnosis not present

## 2020-10-17 DIAGNOSIS — E1122 Type 2 diabetes mellitus with diabetic chronic kidney disease: Secondary | ICD-10-CM | POA: Diagnosis not present

## 2020-10-17 DIAGNOSIS — I130 Hypertensive heart and chronic kidney disease with heart failure and stage 1 through stage 4 chronic kidney disease, or unspecified chronic kidney disease: Secondary | ICD-10-CM | POA: Diagnosis not present

## 2020-10-17 DIAGNOSIS — I4891 Unspecified atrial fibrillation: Secondary | ICD-10-CM | POA: Diagnosis not present

## 2020-10-17 DIAGNOSIS — N183 Chronic kidney disease, stage 3 unspecified: Secondary | ICD-10-CM | POA: Diagnosis not present

## 2020-10-17 DIAGNOSIS — D631 Anemia in chronic kidney disease: Secondary | ICD-10-CM | POA: Diagnosis not present

## 2020-10-17 LAB — POCT INR: INR: 3.4 — AB (ref 2.0–3.0)

## 2020-10-17 NOTE — Telephone Encounter (Signed)
Cathey, I had addressed warfarin instructions.  Please call Kristi to add weight gain. Thanks, Lattie Haw

## 2020-10-17 NOTE — Telephone Encounter (Signed)
Not sure I would necessarily change diuretics at this time.  Some weight gain could have been caloric particular if she is not having any swelling.  Continue with present Lasix dosing and observe.  If trend increases, may need to double Lasix for a few days.

## 2020-10-17 NOTE — Telephone Encounter (Signed)
INR 3.4 PT 41.3  Weight yesterday was 182.4 and today it is 185.2 - no SOB, lungs sound clear, and vitals were good    [phone] please call Steffanie Dunn

## 2020-10-17 NOTE — Telephone Encounter (Signed)
Patient should be taking lasix 20 mg daily. I have left message for Steffanie Dunn to return my call.

## 2020-10-17 NOTE — Patient Instructions (Signed)
Hold warfarin tonight then resume 1 tablet daily except 1.5 tablets on Tuesdays, Thursdays and Saturdays. Recheck in 1 week Order given to [facility].

## 2020-10-17 NOTE — Telephone Encounter (Signed)
[facility] had not seen patient in a couple weeks and said patient looked like she had gained some weight. Patient admits to eating " a lot" over Christmas.She has no swelling in feet or ankles. They just noted 3 lbs weight gain over nite and called.     I will FYI Dr.McDowell

## 2020-10-17 NOTE — Telephone Encounter (Signed)
I left message with [facility],Kristi Dr.Mcdowell's advise to continue to observe weight and call back id she starts trending up word.

## 2020-10-22 DIAGNOSIS — Z86711 Personal history of pulmonary embolism: Secondary | ICD-10-CM | POA: Diagnosis not present

## 2020-10-22 DIAGNOSIS — I4891 Unspecified atrial fibrillation: Secondary | ICD-10-CM | POA: Diagnosis not present

## 2020-10-22 DIAGNOSIS — E785 Hyperlipidemia, unspecified: Secondary | ICD-10-CM | POA: Diagnosis not present

## 2020-10-22 DIAGNOSIS — Z955 Presence of coronary angioplasty implant and graft: Secondary | ICD-10-CM | POA: Diagnosis not present

## 2020-10-22 DIAGNOSIS — I2510 Atherosclerotic heart disease of native coronary artery without angina pectoris: Secondary | ICD-10-CM | POA: Diagnosis not present

## 2020-10-22 DIAGNOSIS — Z7901 Long term (current) use of anticoagulants: Secondary | ICD-10-CM | POA: Diagnosis not present

## 2020-10-22 DIAGNOSIS — I5031 Acute diastolic (congestive) heart failure: Secondary | ICD-10-CM | POA: Diagnosis not present

## 2020-10-22 DIAGNOSIS — D631 Anemia in chronic kidney disease: Secondary | ICD-10-CM | POA: Diagnosis not present

## 2020-10-22 DIAGNOSIS — Z5181 Encounter for therapeutic drug level monitoring: Secondary | ICD-10-CM | POA: Diagnosis not present

## 2020-10-22 DIAGNOSIS — J9601 Acute respiratory failure with hypoxia: Secondary | ICD-10-CM | POA: Diagnosis not present

## 2020-10-22 DIAGNOSIS — Z7984 Long term (current) use of oral hypoglycemic drugs: Secondary | ICD-10-CM | POA: Diagnosis not present

## 2020-10-22 DIAGNOSIS — N183 Chronic kidney disease, stage 3 unspecified: Secondary | ICD-10-CM | POA: Diagnosis not present

## 2020-10-22 DIAGNOSIS — E1122 Type 2 diabetes mellitus with diabetic chronic kidney disease: Secondary | ICD-10-CM | POA: Diagnosis not present

## 2020-10-22 DIAGNOSIS — I350 Nonrheumatic aortic (valve) stenosis: Secondary | ICD-10-CM | POA: Diagnosis not present

## 2020-10-22 DIAGNOSIS — Z7982 Long term (current) use of aspirin: Secondary | ICD-10-CM | POA: Diagnosis not present

## 2020-10-22 DIAGNOSIS — I130 Hypertensive heart and chronic kidney disease with heart failure and stage 1 through stage 4 chronic kidney disease, or unspecified chronic kidney disease: Secondary | ICD-10-CM | POA: Diagnosis not present

## 2020-10-22 DIAGNOSIS — Z7902 Long term (current) use of antithrombotics/antiplatelets: Secondary | ICD-10-CM | POA: Diagnosis not present

## 2020-10-22 DIAGNOSIS — I252 Old myocardial infarction: Secondary | ICD-10-CM | POA: Diagnosis not present

## 2020-10-24 ENCOUNTER — Ambulatory Visit (INDEPENDENT_AMBULATORY_CARE_PROVIDER_SITE_OTHER): Payer: Self-pay | Admitting: *Deleted

## 2020-10-24 DIAGNOSIS — R7612 Nonspecific reaction to cell mediated immunity measurement of gamma interferon antigen response without active tuberculosis: Secondary | ICD-10-CM | POA: Diagnosis not present

## 2020-10-24 DIAGNOSIS — I4892 Unspecified atrial flutter: Secondary | ICD-10-CM

## 2020-10-24 DIAGNOSIS — E1122 Type 2 diabetes mellitus with diabetic chronic kidney disease: Secondary | ICD-10-CM | POA: Diagnosis not present

## 2020-10-24 DIAGNOSIS — I5031 Acute diastolic (congestive) heart failure: Secondary | ICD-10-CM | POA: Diagnosis not present

## 2020-10-24 DIAGNOSIS — I4891 Unspecified atrial fibrillation: Secondary | ICD-10-CM | POA: Diagnosis not present

## 2020-10-24 DIAGNOSIS — N183 Chronic kidney disease, stage 3 unspecified: Secondary | ICD-10-CM | POA: Diagnosis not present

## 2020-10-24 DIAGNOSIS — Z7901 Long term (current) use of anticoagulants: Secondary | ICD-10-CM

## 2020-10-24 DIAGNOSIS — I130 Hypertensive heart and chronic kidney disease with heart failure and stage 1 through stage 4 chronic kidney disease, or unspecified chronic kidney disease: Secondary | ICD-10-CM | POA: Diagnosis not present

## 2020-10-24 DIAGNOSIS — D631 Anemia in chronic kidney disease: Secondary | ICD-10-CM | POA: Diagnosis not present

## 2020-10-24 LAB — POCT INR: INR: 1.7 — AB (ref 2.0–3.0)

## 2020-10-24 NOTE — Patient Instructions (Signed)
Take warfarin 2 tablets tonight then resume 1 tablet daily except 1.5 tablets on Tuesdays, Thursdays and Saturdays. Recheck in 1 week Order given to [facility].

## 2020-10-31 ENCOUNTER — Telehealth: Payer: Self-pay | Admitting: *Deleted

## 2020-10-31 ENCOUNTER — Ambulatory Visit (INDEPENDENT_AMBULATORY_CARE_PROVIDER_SITE_OTHER): Payer: Medicare Other | Admitting: Pharmacist

## 2020-10-31 DIAGNOSIS — I130 Hypertensive heart and chronic kidney disease with heart failure and stage 1 through stage 4 chronic kidney disease, or unspecified chronic kidney disease: Secondary | ICD-10-CM | POA: Diagnosis not present

## 2020-10-31 DIAGNOSIS — D631 Anemia in chronic kidney disease: Secondary | ICD-10-CM | POA: Diagnosis not present

## 2020-10-31 DIAGNOSIS — Z7901 Long term (current) use of anticoagulants: Secondary | ICD-10-CM

## 2020-10-31 DIAGNOSIS — N183 Chronic kidney disease, stage 3 unspecified: Secondary | ICD-10-CM | POA: Diagnosis not present

## 2020-10-31 DIAGNOSIS — I4891 Unspecified atrial fibrillation: Secondary | ICD-10-CM | POA: Diagnosis not present

## 2020-10-31 DIAGNOSIS — I5031 Acute diastolic (congestive) heart failure: Secondary | ICD-10-CM | POA: Diagnosis not present

## 2020-10-31 DIAGNOSIS — I4892 Unspecified atrial flutter: Secondary | ICD-10-CM

## 2020-10-31 DIAGNOSIS — E1122 Type 2 diabetes mellitus with diabetic chronic kidney disease: Secondary | ICD-10-CM | POA: Diagnosis not present

## 2020-10-31 LAB — POCT INR: INR: 2.3 (ref 2.0–3.0)

## 2020-10-31 NOTE — Telephone Encounter (Signed)
Spoke with Janett Billow at [facility].  See anti coag note

## 2020-10-31 NOTE — Patient Instructions (Addendum)
Description   Resume 1 tablet daily except 1.5 tablets on Tuesdays, Thursdays and Saturdays. Recheck in 1 week Order given to [facility].

## 2020-10-31 NOTE — Telephone Encounter (Signed)
[name] with [facility]  PT 27.4 INR  2.3  Please call [phone]

## 2020-11-05 ENCOUNTER — Telehealth: Payer: Self-pay | Admitting: Physician Assistant

## 2020-11-05 ENCOUNTER — Telehealth: Payer: Medicare Other | Admitting: Family

## 2020-11-05 DIAGNOSIS — U071 COVID-19: Secondary | ICD-10-CM

## 2020-11-05 NOTE — Telephone Encounter (Signed)
Thank you. It looks like she is scheduled for a visit on Jan. 27, so this will need to be re-scheduled or changed to a virtual encounter. I will forward to [facility] staff to make them aware. Would also forward to PCP who should be in the loop as it relates to COVID-19 follow-up and management.

## 2020-11-05 NOTE — Telephone Encounter (Signed)
    Patient's daughter contacted answering service to discuss initial management of potential recent COVID diagnosis of the patient.  The patient indicated to her son that she felt like she had a bad cold.  The son performed an at-home antigen test on the patient, which was found to be positive.  Patient feels well otherwise outside of symptoms of a URI.  Oxygen saturations have been greater than 90 on room air.  Patient is vaccinated though has not yet received her booster vaccine.  I directed the patient's daughter to request a virtual visit through [facility] website as an initial step for further testing and potential outpatient treatment modalities.  Patient's daughter questioned if ivermectin was an option.  I notified her this is not FDA approved and would not prescribe this.  ER precautions were discussed.  I will route to the patient's primary cardiologist as she has an upcoming appointment with him this upcoming week which may need to be transition to virtual or postponed based on the above.

## 2020-11-05 NOTE — Progress Notes (Signed)
Based on what you have shared with me, you need to seek an "in-person" evaluation for respiratory symptoms related to Covid 19.   Please call for an appointment:   Respiratory Clinic at [facility] [address] [place], [place]. Monday, Wednesday, Friday 5:30PM to 7:30PM  If you have severe symptoms of any kind, please seek medical care at an emergency room. Our Emergency Departments are best equipped to handle patients with severe symptoms.    . [facility] Emergency Department [place], [place], [place] [address] [phone]  . [facility] Emergency Department [place], [place], [place] [address] [phone]  . [facility] Emergency Department [place], [place], [place] [address] [phone]  . [facility] Emergency Department [address] [place], [place], [place] [address] [phone]  . [facility] Emergency Department [place], [place], [place] [address] [id number]   If you are having a true medical emergency please call 911.  NOTE: If you entered your credit card information for this eVisit, you will not be charged. You may see a "hold" on your card for the $35 but that hold will drop off and you will not have a charge processed.   Your e-visit answers were reviewed by a board certified advanced clinical practitioner to complete your personal care plan.  Thank you for using e-Visits

## 2020-11-06 ENCOUNTER — Telehealth: Payer: Self-pay | Admitting: Cardiology

## 2020-11-06 DIAGNOSIS — R0981 Nasal congestion: Secondary | ICD-10-CM | POA: Diagnosis not present

## 2020-11-06 DIAGNOSIS — U071 COVID-19: Secondary | ICD-10-CM | POA: Diagnosis not present

## 2020-11-06 DIAGNOSIS — R059 Cough, unspecified: Secondary | ICD-10-CM | POA: Diagnosis not present

## 2020-11-06 NOTE — Telephone Encounter (Signed)
Advised daughter that she needed to contact PCP with any covid related questions. Advised if patient placed on antibiotic-to contact our office so that anticoagulation nurse would be aware and make any changes needed to warfarin. Verbalized understanding.

## 2020-11-06 NOTE — Telephone Encounter (Signed)
Patient was diagnosed with COVID.Family is wanting to know if patient can take cough medication or any type of antibiotics for COVID. They are concerned about her heart condition interfering with any medications. [phone]

## 2020-11-07 ENCOUNTER — Ambulatory Visit (INDEPENDENT_AMBULATORY_CARE_PROVIDER_SITE_OTHER): Payer: Medicare Other | Admitting: *Deleted

## 2020-11-07 DIAGNOSIS — I4891 Unspecified atrial fibrillation: Secondary | ICD-10-CM | POA: Diagnosis not present

## 2020-11-07 DIAGNOSIS — D631 Anemia in chronic kidney disease: Secondary | ICD-10-CM | POA: Diagnosis not present

## 2020-11-07 DIAGNOSIS — Z5181 Encounter for therapeutic drug level monitoring: Secondary | ICD-10-CM | POA: Diagnosis not present

## 2020-11-07 DIAGNOSIS — I5031 Acute diastolic (congestive) heart failure: Secondary | ICD-10-CM | POA: Diagnosis not present

## 2020-11-07 DIAGNOSIS — N183 Chronic kidney disease, stage 3 unspecified: Secondary | ICD-10-CM | POA: Diagnosis not present

## 2020-11-07 DIAGNOSIS — E1122 Type 2 diabetes mellitus with diabetic chronic kidney disease: Secondary | ICD-10-CM | POA: Diagnosis not present

## 2020-11-07 DIAGNOSIS — I4892 Unspecified atrial flutter: Secondary | ICD-10-CM

## 2020-11-07 DIAGNOSIS — I130 Hypertensive heart and chronic kidney disease with heart failure and stage 1 through stage 4 chronic kidney disease, or unspecified chronic kidney disease: Secondary | ICD-10-CM | POA: Diagnosis not present

## 2020-11-07 LAB — POCT INR: INR: 1.3 — AB (ref 2.0–3.0)

## 2020-11-07 NOTE — Patient Instructions (Signed)
Take 2 tablets tonight and tomorrow night then resume 1 tablet daily except 1.5 tablets on Tuesdays, Thursdays and Saturdays. Recheck in 1 week Order given to [facility].

## 2020-11-09 ENCOUNTER — Encounter: Payer: Self-pay | Admitting: Cardiology

## 2020-11-09 ENCOUNTER — Telehealth (INDEPENDENT_AMBULATORY_CARE_PROVIDER_SITE_OTHER): Payer: Medicare Other | Admitting: Cardiology

## 2020-11-09 VITALS — Ht 65.5 in | Wt 179.0 lb

## 2020-11-09 DIAGNOSIS — E782 Mixed hyperlipidemia: Secondary | ICD-10-CM

## 2020-11-09 DIAGNOSIS — I25119 Atherosclerotic heart disease of native coronary artery with unspecified angina pectoris: Secondary | ICD-10-CM

## 2020-11-09 DIAGNOSIS — Z86711 Personal history of pulmonary embolism: Secondary | ICD-10-CM | POA: Diagnosis not present

## 2020-11-09 DIAGNOSIS — E785 Hyperlipidemia, unspecified: Secondary | ICD-10-CM | POA: Insufficient documentation

## 2020-11-09 DIAGNOSIS — Z79899 Other long term (current) drug therapy: Secondary | ICD-10-CM

## 2020-11-09 DIAGNOSIS — I4892 Unspecified atrial flutter: Secondary | ICD-10-CM

## 2020-11-09 NOTE — Progress Notes (Signed)
Virtual Visit via Telephone Note   This visit type was conducted due to national recommendations for restrictions regarding the COVID-19 Pandemic (e.g. social distancing) in an effort to limit this patient's exposure and mitigate transmission in our community.  Due to her co-morbid illnesses, this patient is at least at moderate risk for complications without adequate follow up.  This format is felt to be most appropriate for this patient at this time.  The patient did not have access to video technology/had technical difficulties with video requiring transitioning to audio format only (telephone).  All issues noted in this document were discussed and addressed.  No physical exam could be performed with this format.  Please refer to the patient's chart for her  consent to telehealth for [facility].    Date:  11/09/2020   ID:  [name], DOB [birth year], MRN [id number] The patient was identified using 2 identifiers.  Patient Location: Home Provider Location: Office/Clinic  PCP:  Celene Squibb, MD  Cardiologist:  Rozann Lesches, MD Electrophysiologist:  None   Evaluation Performed:  Follow-Up Visit  Chief Complaint:   Cardiac follow-up  History of Present Illness:    [name] is a 80 y.o. female last seen in October 2021 by Mr. Leonides Sake NP, I reviewed the note.  This is our first meeting in follow-up (I saw her while rounding over the weekend during her hospital stay back in October of last year)..  I reviewed her records.  Both by phone today, I also communicated with her daughter. Ms. Luy was just recently diagnosed with COVID-19 and is currently undergoing quarantine at home. She has had upper respiratory symptoms, general malaise with decreased appetite. No chest pain or palpitations.  I reviewed her current cardiac regimen which is outlined below. No obvious bleeding problems on Coumadin along with Plavix. Recent INR was actually subtherapeutic.  We discussed  getting follow-up lab work in comparison to October 2021. She states that she was tolerating her current medications.   Past Medical History:  Diagnosis Date  . Arthritis   . Atrial flutter ([place])   . Bilateral pulmonary embolism ([place]) 08/2008   On coumadin  . Coronary artery disease    s/p DES to mid RCA in 02/2008; s/p inferior MI with DES to mid RCA and mid LAD in 05/2005; DES to mid RCA 07/2020  . Essential hypertension   . Hyperlipidemia   . Ischemic cardiomyopathy   . Memory disorder 07/08/2017  . Myocardial infarction ([place])   . Stroke ([place])   . Type 2 diabetes mellitus ([place])    Past Surgical History:  Procedure Laterality Date  . ATRIAL FLUTTER ABLATION N/A 06/17/2013   Procedure: ATRIAL FLUTTER ABLATION;  Surgeon: Thompson Grayer, MD;  Location: [facility] CATH LAB;  Service: Cardiovascular;  Laterality: N/A;  . CAROTID PTA/STENT INTERVENTION N/A 04/10/2017   Procedure: Carotid PTA/Stent Intervention;  Surgeon: Algernon Huxley, MD;  Location: [facility] CV LAB;  Service: Cardiovascular;  Laterality: N/A;  . CHOLECYSTECTOMY    . CORONARY STENT INTERVENTION N/A 07/14/2020   Procedure: CORONARY STENT INTERVENTION;  Surgeon: Sherren Mocha, MD;  Location: [facility] CV LAB;  Service: Cardiovascular;  Laterality: N/A;  . HIP SURGERY    . KNEE ARTHROSCOPY WITH MEDIAL MENISECTOMY Left 09/25/2017   Procedure: KNEE ARTHROSCOPY WITH MEDIAL AND LATERAL MENISECTOMY;  Surgeon: [name], MD;  Location: [facility] ORS;  Service: Orthopedics;  Laterality: Left;  . LEFT HEART CATH AND CORONARY ANGIOGRAPHY N/A 07/14/2020  Procedure: LEFT HEART CATH AND CORONARY ANGIOGRAPHY;  Surgeon: Sherren Mocha, MD;  Location: [facility] CV LAB;  Service: Cardiovascular;  Laterality: N/A;     Current Meds  Medication Sig  . ALPRAZolam (XANAX) 0.25 MG tablet Take 0.25 mg by mouth 2 (two) times daily as needed.  . Ascorbic Acid (VITAMIN C GUMMIES PO) Take 2 each by mouth daily. 180 mg  . atorvastatin (LIPITOR) 20  MG tablet Take 20 mg by mouth daily.  Marland Kitchen b complex vitamins capsule Take 1 capsule by mouth daily.  . benzonatate (TESSALON) 100 MG capsule Take 100 mg by mouth 3 (three) times daily as needed.  . carvedilol (COREG) 6.25 MG tablet Take 1 tablet (6.25 mg total) by mouth 2 (two) times daily.  . cholecalciferol (VITAMIN D3) 25 MCG (1000 UNIT) tablet Take 1,000 Units by mouth daily.  . clopidogrel (PLAVIX) 75 MG tablet Take 1 tablet (75 mg total) by mouth daily with breakfast.  . Coenzyme Q10 (CO Q 10) 100 MG CAPS Take 100 mg by mouth daily.  Marland Kitchen diltiazem (CARDIZEM) 30 MG tablet Take 1 tablet (30 mg total) by mouth 2 (two) times daily as needed.  . furosemide (LASIX) 20 MG tablet Take 1 tablet (20 mg total) by mouth daily.  Marland Kitchen MEGARED OMEGA-3 KRILL OIL PO Take 1,000 mg by mouth daily. When she remembers  . nitroGLYCERIN (NITROSTAT) 0.4 MG SL tablet Place 1 tablet (0.4 mg total) under the tongue every 5 (five) minutes x 3 doses as needed for chest pain.  . vitamin E (VITAMIN E) 180 MG (400 UNITS) capsule Take 400 Units by mouth daily.  Marland Kitchen warfarin (COUMADIN) 5 MG tablet Take 1-1.5 tablets by mouth as directed. 1 tablet by mouth Sunday-Friday and 1.5 tablets by mouth on Saturday     Allergies:   Patient has no known allergies.   ROS: No syncope.  Prior CV studies:   The following studies were reviewed today:  Cardiac catheterization 07/14/2020: 1.  Multivessel coronary artery disease with patency of the left mainstem, patency of the LAD including the stented segment in that vessel, severe stenosis of the 1st diagonal branch of the LAD, severe diffuse stenosis of a small left circumflex, moderately severe stenosis of the ramus intermedius, and subtotal occlusion of the distal RCA 2.  Successful PCI of the RCA using a 3.0 x 26 mm resolute Onyx DES 3.  Normal LVEDP  Recommendations: Post MI medical therapy, once cangrelor infusion is completed would load with clopidogrel 600 mg x 1 and 75 mg daily.   Restart warfarin today as long as no further procedures planned.  Recommend medical therapy for residual CAD.  Echocardiogram 07/14/2020: 1. Septal hypokinesis with mild LV dysfunction.  2. Left ventricular ejection fraction, by estimation, is 45 to 50%. The  left ventricle has mildly decreased function. The left ventricle  demonstrates regional wall motion abnormalities (see scoring  diagram/findings for description). There is moderate  left ventricular hypertrophy. Left ventricular diastolic parameters are  consistent with Grade II diastolic dysfunction (pseudonormalization).  Elevated left atrial pressure.  3. Right ventricular systolic function is normal. The right ventricular  size is normal.  4. Left atrial size was severely dilated.  5. The mitral valve is normal in structure. No evidence of mitral valve  regurgitation. No evidence of mitral stenosis. Severe mitral annular  calcification.  6. The aortic valve is tricuspid. Aortic valve regurgitation is not  visualized. Mild to moderate aortic valve stenosis.  7. The inferior vena cava  is dilated in size with <50% respiratory  variability, suggesting right atrial pressure of 15 mmHg.   Labs/Other Tests and Data Reviewed:    EKG:  An ECG dated 07/16/2020 was personally reviewed today and demonstrated:  Sinus tachycardia with IVCD.  Recent Labs: 07/14/2020: B Natriuretic Peptide 397.2; TSH 2.309 07/22/2020: BUN 41; Creatinine, Ser 1.58; Hemoglobin 8.9; Platelets 328; Potassium 3.6; Sodium 141   Recent Lipid Panel Lab Results  Component Value Date/Time   CHOL 177 07/14/2020 12:38 AM   TRIG 78 07/14/2020 02:12 AM   HDL 62 07/14/2020 12:38 AM   CHOLHDL 2.9 07/14/2020 12:38 AM   LDLCALC 91 07/14/2020 12:38 AM    Wt Readings from Last 3 Encounters:  11/09/20 179 lb (81.2 kg)  08/03/20 176 lb 9.6 oz (80.1 kg)  07/22/20 176 lb 2.4 oz (79.9 kg)     Objective:    Vital Signs:  Ht 5' 5.5" (1.664 m)   Wt 179 lb (81.2 kg)    BMI 29.33 kg/m    Patient spoke in short sentences, not obviously short of breath over the phone or coughing.  ASSESSMENT & PLAN:    1. CAD status post inferior STEMI in October 2021 managed with DES intervention to the RCA. She has prior history of DES to the LAD and RCA, residual disease now including moderately stenosed ramus intermedius and diffusely diseased first diagonal as well as left circumflex that were managed medically. She is now on Plavix along with Coumadin which she was previously taking with history of pulmonary embolus. Plan to continue Coreg and Lipitor as well. No progressive angina symptoms reported at this time. Check CBC.  2. Mild to moderate calcific aortic stenosis.  3. Mixed hyperlipidemia, tolerating Lipitor. LDL was 91 back in October 2021. Follow-up FLP.  4. Renal insufficiency, creatinine 1.58 in October 2021, follow-up BMET.  5. History of pulmonary embolus on chronic anticoagulation with Coumadin. Keep follow-up in the anticoagulation clinic.   Time:   Today, I have spent 12 minutes with the patient with telehealth technology discussing the above problems.     Medication Adjustments/Labs and Tests Ordered: Current medicines are reviewed at length with the patient today.  Concerns regarding medicines are outlined above.   Tests Ordered: Orders Placed This Encounter  Procedures  . Lipid panel  . CBC  . Basic metabolic panel    Medication Changes: No orders of the defined types were placed in this encounter.   Follow Up:  In Person 3 months.  Signed, Rozann Lesches, MD  11/09/2020 2:58 PM    [place]

## 2020-11-09 NOTE — Patient Instructions (Addendum)
Medication Instructions:   Your physician recommends that you continue on your current medications as directed. Please refer to the Current Medication list given to you today.  Labwork: Your physician recommends that you return for lab work in: CBC, BMET, and fasting lipid panel. Please do not eat or drink for at least 8 hours when you have this done. You may take your medications that morning with a sip of water.  Home health will receive orders for this lab work to be done at home  Testing/Procedures:  none  Follow-Up:  Your physician recommends that you schedule a follow-up appointment in: 3 months in the office.  Any Other Special Instructions Will Be Listed Below (If Applicable).  If you need a refill on your cardiac medications before your next appointment, please call your pharmacy.

## 2020-11-10 ENCOUNTER — Encounter (HOSPITAL_COMMUNITY): Payer: Self-pay | Admitting: Emergency Medicine

## 2020-11-10 ENCOUNTER — Other Ambulatory Visit: Payer: Self-pay

## 2020-11-10 ENCOUNTER — Emergency Department (HOSPITAL_COMMUNITY): Payer: Medicare Other

## 2020-11-10 ENCOUNTER — Observation Stay (HOSPITAL_COMMUNITY)
Admission: EM | Admit: 2020-11-10 | Discharge: 2020-11-11 | Disposition: A | Discharge status: Home / Self Care | Payer: Medicare Other | Attending: Internal Medicine | Admitting: Internal Medicine

## 2020-11-10 DIAGNOSIS — Z79899 Other long term (current) drug therapy: Secondary | ICD-10-CM | POA: Diagnosis not present

## 2020-11-10 DIAGNOSIS — Z7901 Long term (current) use of anticoagulants: Secondary | ICD-10-CM | POA: Insufficient documentation

## 2020-11-10 DIAGNOSIS — E1122 Type 2 diabetes mellitus with diabetic chronic kidney disease: Secondary | ICD-10-CM | POA: Insufficient documentation

## 2020-11-10 DIAGNOSIS — R0602 Shortness of breath: Secondary | ICD-10-CM | POA: Diagnosis not present

## 2020-11-10 DIAGNOSIS — U071 COVID-19: Secondary | ICD-10-CM | POA: Diagnosis not present

## 2020-11-10 DIAGNOSIS — I2510 Atherosclerotic heart disease of native coronary artery without angina pectoris: Secondary | ICD-10-CM | POA: Insufficient documentation

## 2020-11-10 DIAGNOSIS — Z8673 Personal history of transient ischemic attack (TIA), and cerebral infarction without residual deficits: Secondary | ICD-10-CM | POA: Insufficient documentation

## 2020-11-10 DIAGNOSIS — R0902 Hypoxemia: Secondary | ICD-10-CM | POA: Diagnosis not present

## 2020-11-10 DIAGNOSIS — J9601 Acute respiratory failure with hypoxia: Secondary | ICD-10-CM | POA: Diagnosis not present

## 2020-11-10 DIAGNOSIS — Z86711 Personal history of pulmonary embolism: Secondary | ICD-10-CM | POA: Diagnosis not present

## 2020-11-10 DIAGNOSIS — I5042 Chronic combined systolic (congestive) and diastolic (congestive) heart failure: Secondary | ICD-10-CM | POA: Diagnosis not present

## 2020-11-10 DIAGNOSIS — J9600 Acute respiratory failure, unspecified whether with hypoxia or hypercapnia: Secondary | ICD-10-CM | POA: Diagnosis present

## 2020-11-10 DIAGNOSIS — N183 Chronic kidney disease, stage 3 unspecified: Secondary | ICD-10-CM | POA: Diagnosis not present

## 2020-11-10 DIAGNOSIS — R911 Solitary pulmonary nodule: Secondary | ICD-10-CM | POA: Diagnosis not present

## 2020-11-10 DIAGNOSIS — Z87891 Personal history of nicotine dependence: Secondary | ICD-10-CM | POA: Diagnosis not present

## 2020-11-10 DIAGNOSIS — J189 Pneumonia, unspecified organism: Secondary | ICD-10-CM | POA: Diagnosis not present

## 2020-11-10 DIAGNOSIS — R0600 Dyspnea, unspecified: Secondary | ICD-10-CM | POA: Diagnosis not present

## 2020-11-10 DIAGNOSIS — J1282 Pneumonia due to coronavirus disease 2019: Secondary | ICD-10-CM | POA: Insufficient documentation

## 2020-11-10 DIAGNOSIS — Z9861 Coronary angioplasty status: Secondary | ICD-10-CM | POA: Insufficient documentation

## 2020-11-10 DIAGNOSIS — I10 Essential (primary) hypertension: Secondary | ICD-10-CM | POA: Diagnosis not present

## 2020-11-10 DIAGNOSIS — I130 Hypertensive heart and chronic kidney disease with heart failure and stage 1 through stage 4 chronic kidney disease, or unspecified chronic kidney disease: Secondary | ICD-10-CM | POA: Insufficient documentation

## 2020-11-10 DIAGNOSIS — I4892 Unspecified atrial flutter: Secondary | ICD-10-CM | POA: Insufficient documentation

## 2020-11-10 LAB — COMPREHENSIVE METABOLIC PANEL
ALT: 20 U/L (ref 0–44)
AST: 25 U/L (ref 15–41)
Albumin: 3.2 g/dL — ABNORMAL LOW (ref 3.5–5.0)
Alkaline Phosphatase: 65 U/L (ref 38–126)
Anion gap: 6 (ref 5–15)
BUN: 38 mg/dL — ABNORMAL HIGH (ref 8–23)
CO2: 26 mmol/L (ref 22–32)
Calcium: 8.1 mg/dL — ABNORMAL LOW (ref 8.9–10.3)
Chloride: 101 mmol/L (ref 98–111)
Creatinine, Ser: 1.75 mg/dL — ABNORMAL HIGH (ref 0.44–1.00)
GFR, Estimated: 29 mL/min — ABNORMAL LOW (ref >60)
Glucose, Bld: 111 mg/dL — ABNORMAL HIGH (ref 70–99)
Potassium: 4.4 mmol/L (ref 3.5–5.1)
Sodium: 133 mmol/L — ABNORMAL LOW (ref 135–145)
Total Bilirubin: 0.7 mg/dL (ref 0.3–1.2)
Total Protein: 6.5 g/dL (ref 6.5–8.1)

## 2020-11-10 LAB — URINALYSIS, COMPLETE (UACMP) WITH MICROSCOPIC
Bilirubin Urine: NEGATIVE
Glucose, UA: NEGATIVE mg/dL
Hgb urine dipstick: NEGATIVE
Ketones, ur: NEGATIVE mg/dL
Nitrite: POSITIVE — AB
Protein, ur: 30 mg/dL — AB
Specific Gravity, Urine: 1.013 (ref 1.005–1.030)
pH: 5 (ref 5.0–8.0)

## 2020-11-10 LAB — HEMOGLOBIN A1C
Hgb A1c MFr Bld: 7.3 % — ABNORMAL HIGH (ref 4.8–5.6)
Mean Plasma Glucose: 162.81 mg/dL

## 2020-11-10 LAB — CBC WITH DIFFERENTIAL/PLATELET
Abs Immature Granulocytes: 0.01 10*3/uL (ref 0.00–0.07)
Basophils Absolute: 0 10*3/uL (ref 0.0–0.1)
Basophils Relative: 1 %
Eosinophils Absolute: 0 10*3/uL (ref 0.0–0.5)
Eosinophils Relative: 2 %
HCT: 33.5 % — ABNORMAL LOW (ref 36.0–46.0)
Hemoglobin: 10.7 g/dL — ABNORMAL LOW (ref 12.0–15.0)
Immature Granulocytes: 0 %
Lymphocytes Relative: 20 %
Lymphs Abs: 0.5 10*3/uL — ABNORMAL LOW (ref 0.7–4.0)
MCH: 28.5 pg (ref 26.0–34.0)
MCHC: 31.9 g/dL (ref 30.0–36.0)
MCV: 89.3 fL (ref 80.0–100.0)
Monocytes Absolute: 0.3 10*3/uL (ref 0.1–1.0)
Monocytes Relative: 13 %
Neutro Abs: 1.7 10*3/uL (ref 1.7–7.7)
Neutrophils Relative %: 64 %
Platelets: 234 10*3/uL (ref 150–400)
RBC: 3.75 MIL/uL — ABNORMAL LOW (ref 3.87–5.11)
RDW: 15.5 % (ref 11.5–15.5)
WBC: 2.6 10*3/uL — ABNORMAL LOW (ref 4.0–10.5)
nRBC: 0 % (ref 0.0–0.2)

## 2020-11-10 LAB — LACTATE DEHYDROGENASE: LDH: 197 U/L — ABNORMAL HIGH (ref 98–192)

## 2020-11-10 LAB — D-DIMER, QUANTITATIVE: D-Dimer, Quant: 0.61 ug/mL-FEU — ABNORMAL HIGH (ref 0.00–0.50)

## 2020-11-10 LAB — SARS CORONAVIRUS 2 (TAT 6-24 HRS): SARS Coronavirus 2: POSITIVE — AB

## 2020-11-10 LAB — CBG MONITORING, ED
Glucose-Capillary: 105 mg/dL — ABNORMAL HIGH (ref 70–99)
Glucose-Capillary: 260 mg/dL — ABNORMAL HIGH (ref 70–99)

## 2020-11-10 LAB — BRAIN NATRIURETIC PEPTIDE: B Natriuretic Peptide: 294 pg/mL — ABNORMAL HIGH (ref 0.0–100.0)

## 2020-11-10 LAB — TROPONIN I (HIGH SENSITIVITY)
Troponin I (High Sensitivity): 27 ng/L — ABNORMAL HIGH (ref <18)
Troponin I (High Sensitivity): 34 ng/L — ABNORMAL HIGH (ref <18)

## 2020-11-10 LAB — FERRITIN: Ferritin: 52 ng/mL (ref 11–307)

## 2020-11-10 LAB — PROCALCITONIN: Procalcitonin: <0.1 ng/mL

## 2020-11-10 LAB — C-REACTIVE PROTEIN: CRP: 4.4 mg/dL — ABNORMAL HIGH (ref <1.0)

## 2020-11-10 LAB — PROTIME-INR
INR: 2.2 — ABNORMAL HIGH (ref 0.8–1.2)
Prothrombin Time: 23.3 seconds — ABNORMAL HIGH (ref 11.4–15.2)

## 2020-11-10 MED ORDER — ACETAMINOPHEN 325 MG PO TABS: 650.0000 mg | ORAL_TABLET | Freq: Four times a day (QID) | ORAL | Status: DC | PRN

## 2020-11-10 MED ORDER — ONDANSETRON HCL 4 MG/2ML IJ SOLN: 4.0000 mg | Freq: Four times a day (QID) | INTRAMUSCULAR | Status: DC | PRN

## 2020-11-10 MED ORDER — CLOPIDOGREL BISULFATE 75 MG PO TABS
75.0000 mg | ORAL_TABLET | Freq: Every day | ORAL | Status: DC
  Administered 2020-11-10 – 2020-11-11 (×2): 75 mg via ORAL
  Filled 2020-11-10 (×2): qty 1

## 2020-11-10 MED ORDER — ATORVASTATIN CALCIUM 10 MG PO TABS
20.0000 mg | ORAL_TABLET | Freq: Every day | ORAL | Status: DC
  Administered 2020-11-10 – 2020-11-11 (×2): 20 mg via ORAL
  Filled 2020-11-10 (×2): qty 2

## 2020-11-10 MED ORDER — VITAMIN D 25 MCG (1000 UNIT) PO TABS
1000.0000 [IU] | ORAL_TABLET | Freq: Every day | ORAL | Status: DC
  Administered 2020-11-10 – 2020-11-11 (×2): 1000 [IU] via ORAL
  Filled 2020-11-10 (×2): qty 1

## 2020-11-10 MED ORDER — CARVEDILOL 3.125 MG PO TABS
6.2500 mg | ORAL_TABLET | Freq: Two times a day (BID) | ORAL | Status: DC
  Administered 2020-11-10 – 2020-11-11 (×3): 6.25 mg via ORAL
  Filled 2020-11-10 (×3): qty 2

## 2020-11-10 MED ORDER — VITAMIN E 180 MG (400 UNIT) PO CAPS
400.0000 [IU] | ORAL_CAPSULE | Freq: Every day | ORAL | Status: DC
  Filled 2020-11-10 (×4): qty 1

## 2020-11-10 MED ORDER — CO Q 10 100 MG PO CAPS: 100.0000 mg | ORAL_CAPSULE | Freq: Every day | ORAL | Status: DC

## 2020-11-10 MED ORDER — BENZONATATE 100 MG PO CAPS: 100.0000 mg | ORAL_CAPSULE | Freq: Three times a day (TID) | ORAL | Status: DC | PRN

## 2020-11-10 MED ORDER — INSULIN ASPART 100 UNIT/ML ~~LOC~~ SOLN: 0.0000 [IU] | Freq: Three times a day (TID) | SUBCUTANEOUS | Status: DC

## 2020-11-10 MED ORDER — OMEGA-3-ACID ETHYL ESTERS 1 G PO CAPS
1000.0000 mg | ORAL_CAPSULE | Freq: Every day | ORAL | Status: DC
  Administered 2020-11-10: 1000 mg via ORAL
  Filled 2020-11-10: qty 1

## 2020-11-10 MED ORDER — ASCORBIC ACID 500 MG PO TABS
500.0000 mg | ORAL_TABLET | Freq: Every day | ORAL | Status: DC
  Administered 2020-11-10 – 2020-11-11 (×2): 500 mg via ORAL
  Filled 2020-11-10 (×2): qty 1

## 2020-11-10 MED ORDER — ALPRAZOLAM 0.5 MG PO TABS
0.2500 mg | ORAL_TABLET | Freq: Two times a day (BID) | ORAL | Status: DC | PRN
  Administered 2020-11-10: 0.25 mg via ORAL
  Filled 2020-11-10: qty 1

## 2020-11-10 MED ORDER — ZINC SULFATE 220 (50 ZN) MG PO CAPS
220.0000 mg | ORAL_CAPSULE | Freq: Every day | ORAL | Status: DC
  Administered 2020-11-10 – 2020-11-11 (×2): 220 mg via ORAL
  Filled 2020-11-10 (×2): qty 1

## 2020-11-10 MED ORDER — METHYLPREDNISOLONE SODIUM SUCC 125 MG IJ SOLR
60.0000 mg | Freq: Two times a day (BID) | INTRAMUSCULAR | Status: DC
  Administered 2020-11-10 – 2020-11-11 (×3): 60 mg via INTRAVENOUS
  Filled 2020-11-10 (×3): qty 2

## 2020-11-10 MED ORDER — ONDANSETRON HCL 4 MG PO TABS: 4.0000 mg | ORAL_TABLET | Freq: Four times a day (QID) | ORAL | Status: DC | PRN

## 2020-11-10 NOTE — ED Notes (Signed)
Pt up to bedside commode- pt had normal good size BM

## 2020-11-10 NOTE — ED Provider Notes (Signed)
Care transferred to me.  Patient's labs are overall unremarkable besides mild bump in creatinine and low level troponin elevation.  This is likely from hypoxia which appears to be mild from Covid-19.  However because she is on oxygen she will need admission.  Will order CT scan though hopefully chest x-ray findings are more from [place] than a new metastatic disease.  Dr. Carles Collet to admit  [name] was evaluated in Emergency Department on 11/10/2020 for the symptoms described in the history of present illness. She was evaluated in the context of the global COVID-19 pandemic, which necessitated consideration that the patient might be at risk for infection with the SARS-CoV-2 virus that causes COVID-19. Institutional protocols and algorithms that pertain to the evaluation of patients at risk for COVID-19 are in a state of rapid change based on information released by regulatory bodies including the CDC and federal and state organizations. These policies and algorithms were followed during the patient's care in the ED.    Sherwood Gambler, MD 11/10/20 [phone]

## 2020-11-10 NOTE — ED Provider Notes (Signed)
[facility] Provider Note   CSN: [id number] Arrival date & time: 11/10/20  [id number]   History Chief Complaint  Patient presents with  . Covid Positive    [name] is a 80 y.o. female.  The history is provided by the patient and the EMS personnel.  She has history of hypertension, diabetes, hyperlipidemia, coronary artery disease with ischemic cardiomyopathy, pulmonary embolism anticoagulated on warfarin, atrial flutter, chronic kidney disease and comes in by ambulance because of shortness of breath.  She apparently tested positive for COVID-19 with a home test.  At home, patient states she felt like she had a cold.  She has had a dry throat and a minor cough which is nonproductive.  She actually denies dyspnea to me and denies chest pain, heaviness, tightness, pressure.  She also denies nausea, vomiting, diarrhea, body aches.  She denies any change in her sense of smell or taste.  She says the only reason that she came is her daughter thought she needed to come.  EMS reports hypoxia with oxygen saturation 87% on room air which improved with supplemental oxygen.  Of note, she has been vaccinated against COVID-19 but has not received a booster dose.  Past Medical History:  Diagnosis Date  . Arthritis   . Atrial flutter ([place])   . Bilateral pulmonary embolism ([place]) 08/2008   On coumadin  . Coronary artery disease    s/p DES to mid RCA in 02/2008; s/p inferior MI with DES to mid RCA and mid LAD in 05/2005; DES to mid RCA 07/2020  . Essential hypertension   . Hyperlipidemia   . Ischemic cardiomyopathy   . Memory disorder 07/08/2017  . Myocardial infarction ([place])   . Stroke ([place])   . Type 2 diabetes mellitus [facility])     Patient Active Problem List   Diagnosis Date Noted  . Hyperlipidemia   . Acute ST elevation myocardial infarction (STEMI) involving right coronary artery ([place])   . Acute heart failure ([place]) 07/14/2020  . Acute coronary syndrome ([place])   . Acute  pulmonary edema (H[facility])   . Respiratory failure ([place])   . Non-ST elevation (NSTEMI) myocardial infarction ([place])   . S/P left knee arthroscopy 09/25/17 10/02/2017  . Derangement of posterior horn of medial meniscus of left knee   . Derangement of posterior horn of lateral meniscus of left knee   . Primary osteoarthritis of left knee   . Memory disorder 07/08/2017  . Carotid artery stenosis 04/08/2017  . Left arm numbness 03/25/2017  . Chest pain 03/25/2017  . DM type 2 (diabetes mellitus, type 2) ([place]) 03/25/2017  . CKD (chronic kidney disease), stage III ([place]) 03/25/2017  . Long term (current) use of anticoagulants [Z79.01] 04/22/2016  . Facial droop   . Encounter for therapeutic drug monitoring 12/01/2013  . Atrial flutter ([place]) 06/09/2013  . Essential hypertension 06/09/2013  . CAD (coronary artery disease) 06/09/2013  . CLOSED FRACTURE OF SURGICAL NECK OF HUMERUS 04/12/2010  . HIP, ARTHRITIS, DEGEN./OSTEO 03/01/2009  . HIP PAIN 03/01/2009    Past Surgical History:  Procedure Laterality Date  . ATRIAL FLUTTER ABLATION N/A 06/17/2013   Procedure: ATRIAL FLUTTER ABLATION;  Surgeon: Thompson Grayer, MD;  Location: [facility] CATH LAB;  Service: Cardiovascular;  Laterality: N/A;  . CAROTID PTA/STENT INTERVENTION N/A 04/10/2017   Procedure: Carotid PTA/Stent Intervention;  Surgeon: Algernon Huxley, MD;  Location: [place] CV LAB;  Service: Cardiovascular;  Laterality: N/A;  . CHOLECYSTECTOMY    . CORONARY STENT  INTERVENTION N/A 07/14/2020   Procedure: CORONARY STENT INTERVENTION;  Surgeon: Sherren Mocha, MD;  Location: [place] CV LAB;  Service: Cardiovascular;  Laterality: N/A;  . HIP SURGERY    . KNEE ARTHROSCOPY WITH MEDIAL MENISECTOMY Left 09/25/2017   Procedure: KNEE ARTHROSCOPY WITH MEDIAL AND LATERAL MENISECTOMY;  Surgeon: [name], MD;  Location: [facility];  Service: Orthopedics;  Laterality: Left;  . LEFT HEART CATH AND CORONARY ANGIOGRAPHY N/A 07/14/2020   Procedure: LEFT  HEART CATH AND CORONARY ANGIOGRAPHY;  Surgeon: Sherren Mocha, MD;  Location: [place] CV LAB;  Service: Cardiovascular;  Laterality: N/A;     OB History   No obstetric history on file.     Family History  Problem Relation Age of Onset  . Heart disease Other   . Arthritis Other   . Cancer Other   . Diabetes Other   . Heart failure Mother     Social History   Tobacco Use  . Smoking status: Former Smoker    Years: 10.00    Types: Cigarettes    Quit date: 10/14/1978    Years since quitting: 42.1  . Smokeless tobacco: Never Used  Vaping Use  . Vaping Use: Never used  Substance Use Topics  . Alcohol use: No  . Drug use: No    Home Medications Prior to Admission medications   Medication Sig Start Date End Date Taking? Authorizing Provider  ALPRAZolam (XANAX) 0.25 MG tablet Take 0.25 mg by mouth 2 (two) times daily as needed. 06/29/20   [provider]  Ascorbic Acid (VITAMIN C GUMMIES PO) Take 2 each by mouth daily. 180 mg    [provider]  atorvastatin (LIPITOR) 20 MG tablet Take 20 mg by mouth daily.    [provider]  b complex vitamins capsule Take 1 capsule by mouth daily.    [provider]  benzonatate (TESSALON) 100 MG capsule Take 100 mg by mouth 3 (three) times daily as needed. 11/06/20   [provider]  carvedilol (COREG) 6.25 MG tablet Take 1 tablet (6.25 mg total) by mouth 2 (two) times daily. 10/03/20   Satira Sark, MD  cholecalciferol (VITAMIN D3) 25 MCG (1000 UNIT) tablet Take 1,000 Units by mouth daily.    [provider]  clopidogrel (PLAVIX) 75 MG tablet Take 1 tablet (75 mg total) by mouth daily with breakfast. 07/23/20   Kathyrn Drown D, NP  Coenzyme Q10 (CO Q 10) 100 MG CAPS Take 100 mg by mouth daily.    [provider]  diltiazem (CARDIZEM) 30 MG tablet Take 1 tablet (30 mg total) by mouth 2 (two) times daily as needed. 08/15/20   Verta Ellen., NP  furosemide (LASIX) 20 MG  tablet Take 1 tablet (20 mg total) by mouth daily. 09/19/20 12/18/20  Satira Sark, MD  MEGARED OMEGA-3 KRILL OIL PO Take 1,000 mg by mouth daily. When she remembers    [provider]  nitroGLYCERIN (NITROSTAT) 0.4 MG SL tablet Place 1 tablet (0.4 mg total) under the tongue every 5 (five) minutes x 3 doses as needed for chest pain. 08/03/20   Verta Ellen., NP  vitamin E (VITAMIN E) 180 MG (400 UNITS) capsule Take 400 Units by mouth daily.    [provider]  warfarin (COUMADIN) 5 MG tablet Take 1-1.5 tablets by mouth as directed. 1 tablet by mouth Sunday-Friday and 1.5 tablets by mouth on Saturday 05/31/18   [provider]  Allergies    Patient has no known allergies.  Review of Systems   Review of Systems  All other systems reviewed and are negative.   Physical Exam Updated Vital Signs BP (!) 172/66   Pulse 86   Resp 14   Ht '5\' 5"'$  (1.651 m)   Wt 81.2 kg   SpO2 98%   BMI 29.79 kg/m   Physical Exam Vitals and nursing note reviewed.   80 year old female, resting comfortably and in no acute distress. Vital signs are significant for elevated blood pressure. Oxygen saturation is 98%, which is normal. Head is normocephalic and atraumatic. PERRLA, EOMI. Oropharynx is clear. Neck is nontender and supple without adenopathy or JVD. Back is nontender and there is no CVA tenderness. Lungs have bibasilar rales halfway up.  There are no wheezes or rhonchi. Chest is nontender. Heart has regular rate and rhythm without murmur. Abdomen is soft, flat, nontender without masses or hepatosplenomegaly and peristalsis is normoactive. Extremities have no cyanosis or edema, full range of motion is present. Skin is warm and dry without rash. Neurologic: Mental status is normal, cranial nerves are intact, moves all extremities equally.  ED Results / Procedures / Treatments   Labs (all labs ordered are listed, but only abnormal results are displayed) Labs  Reviewed  SARS CORONAVIRUS 2 (TAT 6-24 HRS)  COMPREHENSIVE METABOLIC PANEL  BRAIN NATRIURETIC PEPTIDE  CBC WITH DIFFERENTIAL/PLATELET  TROPONIN I (HIGH SENSITIVITY)    EKG EKG Interpretation  Date/Time:  Friday November 10 2020 06:22:05 EST Ventricular Rate:  82 PR Interval:    QRS Duration: 119 QT Interval:  398 QTC Calculation: 465 R Axis:   133 Text Interpretation: Sinus rhythm Nonspecific intraventricular conduction delay Low voltage with right axis deviation Probable lateral infarct, age indeterminate Probable anteroseptal infarct, old When compared with ECG of 07/16/2020, HEART RATE has decreased Low voltage QRS is now present Rightward axis is now present Confirmed by Delora Fuel ([id number]) on 11/10/2020 6:37:21 AM   Radiology No results found.  Procedures Procedures   Medications Ordered in ED Medications - No data to display  ED Course  I have reviewed the triage vital signs and the nursing notes.  Pertinent labs & imaging results that were available during my care of the patient were reviewed by me and considered in my medical decision making (see chart for details).  MDM Rules/Calculators/A&P Hypoxia in the setting of COVID-19 infection.  With history of ischemic cardiomyopathy, also need to consider heart failure exacerbation.  Old records are reviewed showing ACS with coronary stent placement October 2021 at which time echocardiogram showed ejection fraction 45-50% with grade 2 diastolic dysfunction.  She is resting comfortably in maintaining adequate oxygen saturation with supplemental oxygen.  Will check chest x-ray and screening labs including BNP and troponin.  ECG shows low voltage, nonspecific intraventricular conduction delay, rightward axis.  Chest x-ray shows cardiomegaly which is not significantly changed, bilateral infiltrates consistent with Covid pneumonia per my reading pending official interpretation by radiology.  Case is signed out to Dr.  Regenia Skeeter.  Final Clinical Impression(s) / ED Diagnoses Final diagnoses:  None    Rx / DC Orders ED Discharge Orders    None       Delora Fuel, MD [id number] [phone]

## 2020-11-10 NOTE — ED Notes (Signed)
Spoke with pt daughter, [name], and explained situation and events that have occurred regarding pt mother. Pam explains that her mother can get like this at night and pt has some dementia and can get like this sometimes in the evening. Pam says she will come up here and visit with pt and try to calm her down and that she was not going to take her home.

## 2020-11-10 NOTE — ED Notes (Signed)
Dr Josephine Cables at bedside talking with pt- pt is insisting on leaving- says she wants to go home. Dr Josephine Cables has explained that she will be leaving AMA and explained risks of leaving. Pt is still insisting on leaving.

## 2020-11-10 NOTE — ED Triage Notes (Signed)
Pt tested positive for COVID on Sunday at home. This morning pt called EMS for severe shortness of breath. O2 on RA was 87%, sats improved to 95% on 2L.

## 2020-11-10 NOTE — H&P (Signed)
History and Physical  NEHEMIAH ZADRA [id number] DOB: 1940-12-13 DOA: 11/10/2020   PCP: Celene Squibb, MD   Patient coming from: Home  Chief Complaint: cough, sore throat  HPI:  KASIDEE EGLEY is a 80 y.o. female with medical history of atrial flutter status post ablation September 2014, PE, heart artery disease status post DES to the RCA 07/2020, hypertension, hyperlipidemia, ischemic cardiomyopathy with EF 45-50%, diabetes mellitus type 2, stroke, and cognitive impairment presenting with over 1 week history of sore throat and coughing and decreased po intake.  The patient has had some nasal and chest congestion for this period of time.  Initially, it was felt that this may have been due to her usual "allergies".  However, obtained a home test for COVID-19 on 11/05/2020 which was positive.  Subsequently, the patient self quarantined at home.  Over the next several days, the patient's daughter noted that the patient has had increasing cough and some shortness of breath.  As result, the patient was brought to emergency department for further evaluation.  Upon EMS arrival, the patient was noted to have oxygen saturation of 87% on room air.  She was placed on 2 L with saturation 96-98%. The patient herself denies any fevers, chills, headache, chest pain, nausea, vomiting, diarrhea, abdominal pain, dysuria, hematuria.  She states that she has not been particularly short of breath but has been coughing more.  There is no hemoptysis.  She has been vaccinated x2 without booster. In the emergency department, the patient had a low-grade temperature 100.0 F.  She was hemodynamically stable with oxygen saturation 98% on 2 L.  BMP showed a sodium 133, serum creatinine 1.75 and potassium 4.4.  LFTs were unremarkable.  WBC 2.6, hemoglobin 10.7, platelets 234,000.  Chest x-ray showed pulmonary nodules and chronic exertional changes.  Assessment/Plan: Acute respiratory failure with hypoxia secondary to  COVID-19 -Start IV steroids -Patient is outside the window for benefit from remdesivir -Check inflammatory markers -Wean oxygen as tolerated for greater than 90% saturation  Atrial flutter -Continue warfarin -Status post ablation September 2014 -Currently in sinus rhythm -Continue as needed diltiazem  Coronary artery disease with hx NSTEMI -No chest pain presently -DES to RCA 07/2020 -Continue Plavix -Continue Lipitor -Continue carvedilol  Diabetes Mellitus type 2, controlled -she is not on any agents in outpatient setting -anticipate elevated CBGs from steroids -start novolog sliding scale  Essential hypertension -Continue carvedilol  Ischemic cardiomyopathy/chronic systolic and diastolic CHF -Clinically euvolemic -07/14/2020 echo EF 45 to 50%, grade 2 DD -Hold furosemide today, reevaluate for restart tomorrow  History of PE -Continue warfarin  Hyperlipidemia -Continue statin  Cognitive impairment -Outpatient follow-up         Past Medical History:  Diagnosis Date  . Arthritis   . Atrial flutter ([place])   . Bilateral pulmonary embolism ([place]) 08/2008   On coumadin  . Coronary artery disease    s/p DES to mid RCA in 02/2008; s/p inferior MI with DES to mid RCA and mid LAD in 05/2005; DES to mid RCA 07/2020  . Essential hypertension   . Hyperlipidemia   . Ischemic cardiomyopathy   . Memory disorder 07/08/2017  . Myocardial infarction ([place])   . Stroke ([place])   . Type 2 diabetes mellitus ([place])    Past Surgical History:  Procedure Laterality Date  . ATRIAL FLUTTER ABLATION N/A 06/17/2013   Procedure: ATRIAL FLUTTER ABLATION;  Surgeon: Thompson Grayer, MD;  Location: [facility] CATH LAB;  Service: Cardiovascular;  Laterality: N/A;  . CAROTID PTA/STENT INTERVENTION N/A 04/10/2017   Procedure: Carotid PTA/Stent Intervention;  Surgeon: Algernon Huxley, MD;  Location: [place] CV LAB;  Service: Cardiovascular;  Laterality: N/A;  . CHOLECYSTECTOMY    . CORONARY STENT  INTERVENTION N/A 07/14/2020   Procedure: CORONARY STENT INTERVENTION;  Surgeon: Sherren Mocha, MD;  Location: [place] CV LAB;  Service: Cardiovascular;  Laterality: N/A;  . HIP SURGERY    . KNEE ARTHROSCOPY WITH MEDIAL MENISECTOMY Left 09/25/2017   Procedure: KNEE ARTHROSCOPY WITH MEDIAL AND LATERAL MENISECTOMY;  Surgeon: [name], MD;  Location: [facility] ORS;  Service: Orthopedics;  Laterality: Left;  . LEFT HEART CATH AND CORONARY ANGIOGRAPHY N/A 07/14/2020   Procedure: LEFT HEART CATH AND CORONARY ANGIOGRAPHY;  Surgeon: Sherren Mocha, MD;  Location: [place] [facility] CV LAB;  Service: Cardiovascular;  Laterality: N/A;   Social History:  reports that she quit smoking about 42 years ago. Her smoking use included cigarettes. She quit after 10.00 years of use. She has never used smokeless tobacco. She reports that she does not drink alcohol and does not use drugs.   Family History  Problem Relation Age of Onset  . Heart disease Other   . Arthritis Other   . Cancer Other   . Diabetes Other   . Heart failure Mother      No Known Allergies   Prior to Admission medications   Medication Sig Start Date End Date Taking? Authorizing Provider  ALPRAZolam (XANAX) 0.25 MG tablet Take 0.25 mg by mouth 2 (two) times daily as needed. 06/29/20   [provider]  Ascorbic Acid (VITAMIN C GUMMIES PO) Take 2 each by mouth daily. 180 mg    [provider]  atorvastatin (LIPITOR) 20 MG tablet Take 20 mg by mouth daily.    [provider]  b complex vitamins capsule Take 1 capsule by mouth daily.    [provider]  benzonatate (TESSALON) 100 MG capsule Take 100 mg by mouth 3 (three) times daily as needed. 11/06/20   [provider]  carvedilol (COREG) 6.25 MG tablet Take 1 tablet (6.25 mg total) by mouth 2 (two) times daily. 10/03/20   Satira Sark, MD  cholecalciferol (VITAMIN D3) 25 MCG (1000 UNIT) tablet Take 1,000 Units by mouth daily.    [provider]  clopidogrel (PLAVIX) 75 MG tablet Take 1 tablet (75 mg total) by mouth daily with breakfast. 07/23/20   Kathyrn Drown D, NP  Coenzyme Q10 (CO Q 10) 100 MG CAPS Take 100 mg by mouth daily.    [provider]  diltiazem (CARDIZEM) 30 MG tablet Take 1 tablet (30 mg total) by mouth 2 (two) times daily as needed. 08/15/20   Verta Ellen., NP  furosemide (LASIX) 20 MG tablet Take 1 tablet (20 mg total) by mouth daily. 09/19/20 12/18/20  Satira Sark, MD  MEGARED OMEGA-3 KRILL OIL PO Take 1,000 mg by mouth daily. When she remembers    [provider]  nitroGLYCERIN (NITROSTAT) 0.4 MG SL tablet Place 1 tablet (0.4 mg total) under the tongue every 5 (five) minutes x 3 doses as needed for chest pain. 08/03/20   Verta Ellen., NP  vitamin E (VITAMIN E) 180 MG (400 UNITS) capsule Take 400 Units by mouth daily.    [provider]  warfarin (COUMADIN) 5 MG tablet Take 1-1.5 tablets by mouth as directed. 1 tablet by mouth Sunday-Friday and 1.5 tablets by mouth on Saturday  05/31/18   [provider]    Review of Systems:  Constitutional:  No weight loss, night sweats, Fevers, chills Head&Eyes: No headache.  No vision loss.  No eye pain or scotoma ENT:  No Difficulty swallowing,Tooth/dental problems,Sore throat,  No ear ache, post nasal drip,  Cardio-vascular:  No chest pain, Orthopnea, PND, swelling in lower extremities,  dizziness, palpitations  GI:  No  abdominal pain, nausea, vomiting, diarrhea, loss of appetite, hematochezia, melena, heartburn, indigestion, Resp:   No coughing up of blood .No wheezing.No chest wall deformity  Skin:  no rash or lesions.  GU:  no dysuria, change in color of urine, no urgency or frequency. No flank pain.  Musculoskeletal:  No joint pain or swelling. No decreased range of motion. No back pain.  Psych:  No change in mood or affect. No depression or anxiety. Neurologic: No headache, no dysesthesia, no  focal weakness, no vision loss. No syncope  Physical Exam: Vitals:   11/10/20 0700 11/10/20 0730 11/10/20 0800 11/10/20 0804  BP: (!) 103/41 (!) 107/43 (!) 104/43   Pulse: 68 69 67   Resp: (!) 24 (!) 24 (!) 21   Temp:      SpO2: 97% 97% 98% 98%  Weight:      Height:       General:  A&O x 3, NAD, nontoxic, pleasant/cooperative Head/Eye: No conjunctival hemorrhage, no icterus, [place]/AT, No nystagmus ENT:  No icterus,  No thrush, good dentition, no pharyngeal exudate Neck:  No masses, no lymphadenpathy, no bruits CV:  RRR, no rub, no gallop, no S3 Lung:  Bibasilar rales. No wheeze Abdomen: soft/NT, +BS, nondistended, no peritoneal signs Ext: No cyanosis, No rashes, No petechiae, No lymphangitis, No edema Neuro: CNII-XII intact, strength 4/5 in bilateral upper and lower extremities, no dysmetria  Labs on Admission:  Basic Metabolic Panel: Recent Labs  Lab 11/10/20 0650  NA 133*  K 4.4  CL 101  CO2 26  GLUCOSE 111*  BUN 38*  CREATININE 1.75*  CALCIUM 8.1*   Liver Function Tests: Recent Labs  Lab 11/10/20 0650  AST 25  ALT 20  ALKPHOS 65  BILITOT 0.7  PROT 6.5  ALBUMIN 3.2*   No results for input(s): LIPASE, AMYLASE in the last 168 hours. No results for input(s): AMMONIA in the last 168 hours. CBC: Recent Labs  Lab 11/10/20 0650  WBC 2.6*  NEUTROABS 1.7  HGB 10.7*  HCT 33.5*  MCV 89.3  PLT 234   Coagulation Profile: Recent Labs  Lab 11/07/20 0000 11/10/20 0650  INR 1.3* 2.2*   Cardiac Enzymes: No results for input(s): CKTOTAL, CKMB, CKMBINDEX, TROPONINI in the last 168 hours. BNP: Invalid input(s): POCBNP CBG: No results for input(s): GLUCAP in the last 168 hours. Urine analysis:    Component Value Date/Time   COLORURINE STRAW (A) 03/25/2017 0550   APPEARANCEUR CLEAR 03/25/2017 0550   LABSPEC 1.010 03/25/2017 0550   PHURINE 7.0 03/25/2017 0550   GLUCOSEU NEGATIVE 03/25/2017 0550   HGBUR SMALL (A) 03/25/2017 0550   BILIRUBINUR NEGATIVE  03/25/2017 0550   KETONESUR NEGATIVE 03/25/2017 0550   PROTEINUR NEGATIVE 03/25/2017 0550   NITRITE NEGATIVE 03/25/2017 0550   LEUKOCYTESUR NEGATIVE 03/25/2017 0550   Sepsis Labs: '@LABRCNTIP'$ (procalcitonin:4,lacticidven:4) )No results found for this or any previous visit (from the past 240 hour(s)).   Radiological Exams on Admission: DG Chest Port 1 View  Result Date: 11/10/2020 CLINICAL DATA:  Dyspnea, COVID pneumonia EXAM: PORTABLE CHEST 1 VIEW COMPARISON:  07/16/2020 FINDINGS: The lungs are  symmetrically well expanded. Since the prior examination, there have developed multiple pulmonary nodules. No pneumothorax or pleural effusion. Cardiac size within normal limits. The pulmonary vascularity is normal. No acute bone abnormality. IMPRESSION: Interval development of multiple pulmonary nodules, inflammatory versus metastatic disease. CT examination is recommended for further characterization. Electronically Signed   By: Fidela Salisbury MD   On: 11/10/2020 06:59    EKG: Independently reviewed. Sinus, nonspecific STT change    Time spent:60 minutes Code Status:   FULL Family Communication:  Daughter updated 1/28 Disposition Plan: expect 1-2 day hospitalization Consults called: none DVT Prophylaxis: [place] Lovenox  Orson Eva, DO  [facility] Pager [phone]  If 7PM-7AM, please contact night-coverage [email or web address] Password [facility] 11/10/2020, 8:36 AM

## 2020-11-10 NOTE — ED Notes (Addendum)
Pt hit call bell, Vel[place], ED sec notified this nurse- this nurse walks in room, pt is belligerent, yelling at this nurse about her dinner tray not being removed in timely manner, asking for "her bill because she wants to get out of here". This nurse removed dinner tray and offered to assist her with any requests or concerns she may have. Pt continues to yell repeating complaints regarding dinner tray. Dr Josephine Cables made aware-  says he will come and talk to pt when able.

## 2020-11-10 NOTE — ED Notes (Signed)
Pt O2 has dropped some, as pt is sleeping, O2 reapplied via [place] running @ 2L

## 2020-11-10 NOTE — ED Notes (Signed)
Pt to CT

## 2020-11-10 NOTE — Care Management Obs Status (Signed)
[place] NOTIFICATION   Patient Details  Name: [name] MRN: [id number] Date of Birth: [birth year]   Medicare Observation Status Notification Given:  Yes    Tommy Medal 11/10/2020, 5:12 PM

## 2020-11-11 DIAGNOSIS — U071 COVID-19: Secondary | ICD-10-CM

## 2020-11-11 DIAGNOSIS — J9600 Acute respiratory failure, unspecified whether with hypoxia or hypercapnia: Secondary | ICD-10-CM

## 2020-11-11 LAB — CBG MONITORING, ED: Glucose-Capillary: 238 mg/dL — ABNORMAL HIGH (ref 70–99)

## 2020-11-11 MED ORDER — INSULIN ASPART 100 UNIT/ML ~~LOC~~ SOLN
0.0000 [IU] | Freq: Three times a day (TID) | SUBCUTANEOUS | Status: DC
  Administered 2020-11-11: 5 [IU] via SUBCUTANEOUS
  Filled 2020-11-11: qty 1

## 2020-11-11 MED ORDER — PREDNISONE 10 MG PO TABS: ORAL_TABLET | ORAL | 0 refills | Status: AC

## 2020-11-11 MED ORDER — GUAIFENESIN-DM 100-10 MG/5ML PO SYRP: 5.0000 mL | ORAL_SOLUTION | ORAL | 0 refills | Status: DC | PRN

## 2020-11-11 MED ORDER — ALBUTEROL SULFATE HFA 108 (90 BASE) MCG/ACT IN AERS: 2.0000 | INHALATION_SPRAY | Freq: Four times a day (QID) | RESPIRATORY_TRACT | 2 refills | Status: DC | PRN

## 2020-11-11 MED ORDER — ZINC SULFATE 220 (50 ZN) MG PO CAPS: 220.0000 mg | ORAL_CAPSULE | Freq: Every day | ORAL | 0 refills | Status: AC

## 2020-11-11 MED ORDER — INSULIN ASPART 100 UNIT/ML ~~LOC~~ SOLN: 0.0000 [IU] | Freq: Every day | SUBCUTANEOUS | Status: DC

## 2020-11-11 NOTE — Discharge Summary (Signed)
Physician Discharge Summary  [name] [id number] DOB: 20-Aug-1941 DOA: 11/10/2020  PCP: Celene Squibb, MD  Admit date: 11/10/2020  Discharge date: 11/11/2020  Admitted From:Home  Disposition:  Home  Recommendations for Outpatient Follow-up:  1. Follow up with PCP in 1-2 weeks 2. Please obtain BMP/CBC in one week 3. Continue on prednisone as prescribed for several more days to complete course of treatment  Home Health:None  Equipment/Devices:None  Discharge Condition:Stable  CODE STATUS: DNR  Diet recommendation: Heart Healthy/Carb Modified  Brief/Interim Summary: Per HPI: [name] is a 80 y.o. female with medical history of atrial flutter status post ablation September 2014, PE, heart artery disease status post DES to the RCA 07/2020, hypertension, hyperlipidemia, ischemic cardiomyopathy with EF 45-50%, diabetes mellitus type 2, stroke, and cognitive impairment presenting with over 1 week history of sore throat and coughing and decreased po intake.  The patient has had some nasal and chest congestion for this period of time.  Initially, it was felt that this may have been due to her usual "allergies".  However, obtained a home test for COVID-19 on 11/05/2020 which was positive.  Subsequently, the patient self quarantined at home.  Over the next several days, the patient's daughter noted that the patient has had increasing cough and some shortness of breath.  As result, the patient was brought to emergency department for further evaluation.  Upon EMS arrival, the patient was noted to have oxygen saturation of 87% on room air.  She was placed on 2 L with saturation 96-98%. The patient herself denies any fevers, chills, headache, chest pain, nausea, vomiting, diarrhea, abdominal pain, dysuria, hematuria.  She states that she has not been particularly short of breath but has been coughing more.  There is no hemoptysis.  She has been vaccinated x2 without booster. In the  emergency department, the patient had a low-grade temperature 100.0 F.  She was hemodynamically stable with oxygen saturation 98% on 2 L.  BMP showed a sodium 133, serum creatinine 1.75 and potassium 4.4.  LFTs were unremarkable.  WBC 2.6, hemoglobin 10.7, platelets 234,000.  Chest x-ray showed pulmonary nodules and chronic exertional changes.  -Patient was admitted with acute hypoxemic respiratory failure secondary to COVID-19 pneumonia and was started on steroids on IV, but was outside of the window for benefit from remdesivir.  She had remained on 2 L nasal cannula with no shortness of breath noted.  Her dyspnea had improved considerably and she is feeling well enough for discharge today.  She can now ambulate without any need for oxygen noted.  She will continue her usual medications in the outpatient setting as well as steroids as prescribed.  No other acute events noted during this brief admission.  Discharge Diagnoses:  Active Problems:   Acute respiratory failure due to COVID-19 [facility])  Principal discharge diagnosis: Acute hypoxemic respiratory failure secondary to COVID-19 pneumonia.  Discharge Instructions  Discharge Instructions    Diet - low sodium heart healthy   Complete by: As directed    Increase activity slowly   Complete by: As directed      Allergies as of 11/11/2020   No Known Allergies     Medication List    STOP taking these medications   diltiazem 30 MG tablet Commonly known as: Cardizem     TAKE these medications   albuterol 108 (90 Base) MCG/ACT inhaler Commonly known as: VENTOLIN HFA Inhale 2 puffs into the lungs every 6 (six) hours as needed for wheezing or  shortness of breath.   ALPRAZolam 0.25 MG tablet Commonly known as: XANAX Take 0.125-0.25 mg by mouth 2 (two) times daily as needed for anxiety or sleep.   atorvastatin 20 MG tablet Commonly known as: LIPITOR Take 20 mg by mouth daily.   b complex vitamins capsule Take 1 capsule by mouth  daily.   benzonatate 100 MG capsule Commonly known as: TESSALON Take 100 mg by mouth 3 (three) times daily as needed for cough.   carvedilol 6.25 MG tablet Commonly known as: COREG Take 1 tablet (6.25 mg total) by mouth 2 (two) times daily.   cholecalciferol 25 MCG (1000 UNIT) tablet Commonly known as: VITAMIN D3 Take 1,000 Units by mouth daily.   clopidogrel 75 MG tablet Commonly known as: PLAVIX Take 1 tablet (75 mg total) by mouth daily with breakfast.   Co Q 10 100 MG Caps Take 100 mg by mouth daily.   furosemide 20 MG tablet Commonly known as: LASIX Take 1 tablet (20 mg total) by mouth daily.   glipiZIDE 5 MG 24 hr tablet Commonly known as: GLUCOTROL XL Take 5 mg by mouth daily.   guaiFENesin-dextromethorphan 100-10 MG/5ML syrup Commonly known as: ROBITUSSIN DM Take 5 mLs by mouth every 4 (four) hours as needed for cough.   MEGARED OMEGA-3 KRILL OIL PO Take 1,000 mg by mouth daily. When she remembers   nitroGLYCERIN 0.4 MG SL tablet Commonly known as: NITROSTAT Place 1 tablet (0.4 mg total) under the tongue every 5 (five) minutes x 3 doses as needed for chest pain.   predniSONE 10 MG tablet Commonly known as: DELTASONE Take 4 tablets (40 mg total) by mouth 2 (two) times daily with a meal for 2 days, THEN 3 tablets (30 mg total) 2 (two) times daily with a meal for 2 days, THEN 2 tablets (20 mg total) 2 (two) times daily with a meal for 2 days, THEN 1 tablet (10 mg total) 2 (two) times daily with a meal for 2 days. Start taking on: November 11, 2020   VITAMIN C GUMMIES PO Take 2 each by mouth daily. 180 mg   vitamin E 180 MG (400 UNITS) capsule Take 400 Units by mouth daily.   warfarin 5 MG tablet Commonly known as: COUMADIN Take as directed. If you are unsure how to take this medication, talk to your nurse or doctor. Original instructions: Take 1-1.5 tablets by mouth as directed. 1 tablet by mouth Sunday-Friday and 1.5 tablets by mouth on Saturday   zinc  sulfate 220 (50 Zn) MG capsule Take 1 capsule (220 mg total) by mouth daily for 15 days.       Follow-up Information    Celene Squibb, MD Follow up in 2 week(s).   Specialty: Internal Medicine Contact information: [place] [address] [phone]              No Known Allergies  Consultations:  None   Procedures/Studies: CT Chest Wo Contrast  Result Date: 11/10/2020 CLINICAL DATA:  80 year old female with shortness of breath, positive for COVID-19. 5 days ago. Hypoxia on room air. Nodular appearance on portable chest x-ray earlier today. EXAM: CT CHEST WITHOUT CONTRAST TECHNIQUE: Multidetector CT imaging of the chest was performed following the standard protocol without IV contrast. COMPARISON:  Portable chest x-ray 0647 hours.  Chest CTA 08/23/2008. FINDINGS: Cardiovascular: Extensive Calcified aortic atherosclerosis. Extensive calcified coronary artery atherosclerosis and/or stents. Stable cardiomegaly. No pericardial effusion. Vascular patency is not evaluated in the absence  of IV contrast. Mediastinum/Nodes: Negative.  No lymphadenopathy. Lungs/Pleura: Trace bilateral layering pleural effusions. Confluent left perihilar and segmental right lower lobe ground-glass opacity, trending toward consolidation. Multifocal additional areas of irregular solid nodular opacity with surrounding ground-glass density ("halo sign" such as series 4, images 50 and 73). Occasional much smaller ground-glass pulmonary nodules (right upper lobe). Right middle lobe relatively spared. Major airways remain patent. Upper Abdomen: Negative visible noncontrast liver, spleen, pancreas, adrenal glands, right kidney and small bowel in the upper abdomen. Small gastric hiatal hernia. Diverticulosis of the transverse colon. Partially visible relatively large exophytic but simple fluid density cyst of the left renal upper pole, present in 2009. Musculoskeletal: Osteopenia. Spine degeneration. No  acute osseous abnormality identified. IMPRESSION: 1. Scattered confluent peribronchial and segmental lung opacity, as well as multiple smaller part solid/ground-glass pulmonary nodules in both lungs. Trace superimposed pleural effusions. Favor COVID-19 pneumonia in this setting. The main differential consideration is invasive fungal infection. 2. Cardiomegaly. Extensive calcified coronary artery and Aortic Atherosclerosis (ICD10-I70.0). Electronically Signed   By: Genevie Ann M.D.   On: 11/10/2020 09:20   DG Chest Port 1 View  Result Date: 11/10/2020 CLINICAL DATA:  Dyspnea, COVID pneumonia EXAM: PORTABLE CHEST 1 VIEW COMPARISON:  07/16/2020 FINDINGS: The lungs are symmetrically well expanded. Since the prior examination, there have developed multiple pulmonary nodules. No pneumothorax or pleural effusion. Cardiac size within normal limits. The pulmonary vascularity is normal. No acute bone abnormality. IMPRESSION: Interval development of multiple pulmonary nodules, inflammatory versus metastatic disease. CT examination is recommended for further characterization. Electronically Signed   By: Fidela Salisbury MD   On: 11/10/2020 06:59      Discharge Exam: Vitals:   11/11/20 0700 11/11/20 0800  BP: (!) 153/61 (!) 140/57  Pulse: (!) 59 60  Resp: 19 20  Temp:    SpO2: 95% 95%   Vitals:   11/11/20 0500 11/11/20 0600 11/11/20 0700 11/11/20 0800  BP: (!) 128/52 (!) 110/47 (!) 153/61 (!) 140/57  Pulse: 61 (!) 52 (!) 59 60  Resp: '17 17 19 20  '$ Temp:      TempSrc:      SpO2: 94% 93% 95% 95%  Weight:      Height:        General: Pt is alert, awake, not in acute distress Cardiovascular: RRR, S1/S2 +, no rubs, no gallops Respiratory: CTA bilaterally, no wheezing, no rhonchi Abdominal: Soft, NT, ND, bowel sounds + Extremities: no edema, no cyanosis    The results of significant diagnostics from this hospitalization (including imaging, microbiology, ancillary and laboratory) are listed below for  reference.     Microbiology: Recent Results (from the past 240 hour(s))  SARS CORONAVIRUS 2 (TAT 6-24 HRS) Nasopharyngeal Nasopharyngeal Swab     Status: Abnormal   Collection Time: 11/10/20  6:50 AM   Specimen: Nasopharyngeal Swab  Result Value Ref Range Status   SARS Coronavirus 2 POSITIVE (A) NEGATIVE Final    Comment: (NOTE) SARS-CoV-2 target nucleic acids are DETECTED.  The SARS-CoV-2 RNA is generally detectable in upper and lower respiratory specimens during the acute phase of infection. Positive results are indicative of the presence of SARS-CoV-2 RNA. Clinical correlation with patient history and other diagnostic information is  necessary to determine patient infection status. Positive results do not rule out bacterial infection or co-infection with other viruses.  The expected result is Negative.  Fact Sheet for Patients: SugarRoll.be  Fact Sheet for Healthcare Providers: https://www.woods-mathews.com/  This test is not yet approved or cleared  by the [place] and  has been authorized for detection and/or diagnosis of SARS-CoV-2 by FDA under an Emergency Use Authorization (EUA). This EUA will remain  in effect (meaning this test can be used) for the duration of the COVID-19 declaration under Section 564(b)(1) of the Act, 21 U. S.C. section 360bbb-3(b)(1), unless the authorization is terminated or revoked sooner.   Performed at [facility], [place] [address], [place], [place] [address]      Labs: BNP (last 3 results) Recent Labs    07/14/20 0212 11/10/20 0650  BNP 397.2* [id number]*   Basic Metabolic Panel: Recent Labs  Lab 11/10/20 0650  NA 133*  K 4.4  CL 101  CO2 26  GLUCOSE 111*  BUN 38*  CREATININE 1.75*  CALCIUM 8.1*   Liver Function Tests: Recent Labs  Lab 11/10/20 0650  AST 25  ALT 20  ALKPHOS 65  BILITOT 0.7  PROT 6.5  ALBUMIN 3.2*   No results for input(s): LIPASE, AMYLASE in the  last 168 hours. No results for input(s): AMMONIA in the last 168 hours. CBC: Recent Labs  Lab 11/10/20 0650  WBC 2.6*  NEUTROABS 1.7  HGB 10.7*  HCT 33.5*  MCV 89.3  PLT 234   Cardiac Enzymes: No results for input(s): CKTOTAL, CKMB, CKMBINDEX, TROPONINI in the last 168 hours. BNP: Invalid input(s): POCBNP CBG: Recent Labs  Lab 11/10/20 1225 11/10/20 1804 11/11/20 0848  GLUCAP 105* 260* 238*   D-Dimer Recent Labs    11/10/20 0650  DDIMER 0.61*   Hgb A1c Recent Labs    11/10/20 0650  HGBA1C 7.3*   Lipid Profile No results for input(s): CHOL, HDL, LDLCALC, TRIG, CHOLHDL, LDLDIRECT in the last 72 hours. Thyroid function studies No results for input(s): TSH, T4TOTAL, T3FREE, THYROIDAB in the last 72 hours.  Invalid input(s): FREET3 Anemia work up Recent Labs    11/10/20 0650  FERRITIN 52   Urinalysis    Component Value Date/Time   COLORURINE YELLOW 11/10/2020 0930   APPEARANCEUR CLEAR 11/10/2020 0930   LABSPEC 1.013 11/10/2020 0930   PHURINE 5.0 11/10/2020 0930   GLUCOSEU NEGATIVE 11/10/2020 0930   HGBUR NEGATIVE 11/10/2020 0930   BILIRUBINUR NEGATIVE 11/10/2020 0930   KETONESUR NEGATIVE 11/10/2020 0930   PROTEINUR 30 (A) 11/10/2020 0930   NITRITE POSITIVE (A) 11/10/2020 0930   LEUKOCYTESUR TRACE (A) 11/10/2020 0930   Sepsis Labs Invalid input(s): PROCALCITONIN,  WBC,  LACTICIDVEN Microbiology Recent Results (from the past 240 hour(s))  SARS CORONAVIRUS 2 (TAT 6-24 HRS) Nasopharyngeal Nasopharyngeal Swab     Status: Abnormal   Collection Time: 11/10/20  6:50 AM   Specimen: Nasopharyngeal Swab  Result Value Ref Range Status   SARS Coronavirus 2 POSITIVE (A) NEGATIVE Final    Comment: (NOTE) SARS-CoV-2 target nucleic acids are DETECTED.  The SARS-CoV-2 RNA is generally detectable in upper and lower respiratory specimens during the acute phase of infection. Positive results are indicative of the presence of SARS-CoV-2 RNA. Clinical correlation  with patient history and other diagnostic information is  necessary to determine patient infection status. Positive results do not rule out bacterial infection or co-infection with other viruses.  The expected result is Negative.  Fact Sheet for Patients: SugarRoll.be  Fact Sheet for Healthcare Providers: https://www.woods-mathews.com/  This test is not yet approved or cleared by the [place] FDA and  has been authorized for detection and/or diagnosis of SARS-CoV-2 by FDA under an Emergency Use Authorization (EUA). This EUA will remain  in  effect (meaning this test can be used) for the duration of the COVID-19 declaration under Section 564(b)(1) of the Act, 21 U. S.C. section 360bbb-3(b)(1), unless the authorization is terminated or revoked sooner.   Performed at [facility], [place] [address], [place], [place] [address]      Time coordinating discharge: 35 minutes  SIGNED:   Rodena Goldmann, DO [facility] 11/11/2020, 9:23 AM  If 7PM-7AM, please contact night-coverage [email or web address]

## 2020-11-11 NOTE — ED Notes (Signed)
Pt tolerated ambulation well. Ambulated in room. Lowest oxygen reading was 93% on room air.

## 2020-11-11 NOTE — ED Notes (Signed)
Spoke with daughter to pick up pt from d/c. Daughter will be here before 11am.

## 2020-11-11 NOTE — ED Notes (Signed)
Dr in to see pt

## 2020-11-13 ENCOUNTER — Ambulatory Visit (INDEPENDENT_AMBULATORY_CARE_PROVIDER_SITE_OTHER): Payer: Medicare Other | Admitting: *Deleted

## 2020-11-13 DIAGNOSIS — E1122 Type 2 diabetes mellitus with diabetic chronic kidney disease: Secondary | ICD-10-CM | POA: Diagnosis not present

## 2020-11-13 DIAGNOSIS — Z7901 Long term (current) use of anticoagulants: Secondary | ICD-10-CM

## 2020-11-13 DIAGNOSIS — D631 Anemia in chronic kidney disease: Secondary | ICD-10-CM | POA: Diagnosis not present

## 2020-11-13 DIAGNOSIS — N183 Chronic kidney disease, stage 3 unspecified: Secondary | ICD-10-CM | POA: Diagnosis not present

## 2020-11-13 DIAGNOSIS — I130 Hypertensive heart and chronic kidney disease with heart failure and stage 1 through stage 4 chronic kidney disease, or unspecified chronic kidney disease: Secondary | ICD-10-CM | POA: Diagnosis not present

## 2020-11-13 DIAGNOSIS — I5031 Acute diastolic (congestive) heart failure: Secondary | ICD-10-CM | POA: Diagnosis not present

## 2020-11-13 DIAGNOSIS — I4891 Unspecified atrial fibrillation: Secondary | ICD-10-CM | POA: Diagnosis not present

## 2020-11-13 LAB — URINE CULTURE: Culture: >=100000 — AB

## 2020-11-13 LAB — POCT INR: INR: 4.4 — AB (ref 2.0–3.0)

## 2020-11-13 NOTE — Patient Instructions (Signed)
Started on prednisone taper in hospital. ('80mg'$  x 2, '60mg'$  x 2, '40mg'$  x 2, '20mg'$  x 2) Hold warfarin tonight, take 2.'5mg'$  Tuesdays and '5mg'$  Wednesday. Recheck on Thursday Order given to [facility]

## 2020-11-16 ENCOUNTER — Other Ambulatory Visit (HOSPITAL_COMMUNITY)
Admission: RE | Admit: 2020-11-16 | Discharge: 2020-11-16 | Disposition: A | Discharge status: Home / Self Care | Payer: Medicare Other | Source: Skilled Nursing Facility | Attending: Internal Medicine | Admitting: Internal Medicine

## 2020-11-16 ENCOUNTER — Ambulatory Visit (INDEPENDENT_AMBULATORY_CARE_PROVIDER_SITE_OTHER): Payer: Medicare Other | Admitting: *Deleted

## 2020-11-16 DIAGNOSIS — I130 Hypertensive heart and chronic kidney disease with heart failure and stage 1 through stage 4 chronic kidney disease, or unspecified chronic kidney disease: Secondary | ICD-10-CM | POA: Diagnosis not present

## 2020-11-16 DIAGNOSIS — I4892 Unspecified atrial flutter: Secondary | ICD-10-CM

## 2020-11-16 DIAGNOSIS — I4891 Unspecified atrial fibrillation: Secondary | ICD-10-CM | POA: Diagnosis not present

## 2020-11-16 DIAGNOSIS — D631 Anemia in chronic kidney disease: Secondary | ICD-10-CM | POA: Diagnosis not present

## 2020-11-16 DIAGNOSIS — I5031 Acute diastolic (congestive) heart failure: Secondary | ICD-10-CM | POA: Diagnosis not present

## 2020-11-16 DIAGNOSIS — U071 COVID-19: Secondary | ICD-10-CM | POA: Insufficient documentation

## 2020-11-16 DIAGNOSIS — Z5181 Encounter for therapeutic drug level monitoring: Secondary | ICD-10-CM | POA: Diagnosis not present

## 2020-11-16 DIAGNOSIS — E1122 Type 2 diabetes mellitus with diabetic chronic kidney disease: Secondary | ICD-10-CM | POA: Diagnosis not present

## 2020-11-16 DIAGNOSIS — N183 Chronic kidney disease, stage 3 unspecified: Secondary | ICD-10-CM | POA: Diagnosis not present

## 2020-11-16 LAB — BASIC METABOLIC PANEL
Anion gap: 11 (ref 5–15)
BUN: 44 mg/dL — ABNORMAL HIGH (ref 8–23)
CO2: 26 mmol/L (ref 22–32)
Calcium: 8.8 mg/dL — ABNORMAL LOW (ref 8.9–10.3)
Chloride: 98 mmol/L (ref 98–111)
Creatinine, Ser: 1.55 mg/dL — ABNORMAL HIGH (ref 0.44–1.00)
GFR, Estimated: 34 mL/min — ABNORMAL LOW (ref >60)
Glucose, Bld: 314 mg/dL — ABNORMAL HIGH (ref 70–99)
Potassium: 4.7 mmol/L (ref 3.5–5.1)
Sodium: 135 mmol/L (ref 135–145)

## 2020-11-16 LAB — CBC WITH DIFFERENTIAL/PLATELET
Abs Immature Granulocytes: 0.02 10*3/uL (ref 0.00–0.07)
Basophils Absolute: 0 10*3/uL (ref 0.0–0.1)
Basophils Relative: 0 %
Eosinophils Absolute: 0 10*3/uL (ref 0.0–0.5)
Eosinophils Relative: 0 %
HCT: 33.4 % — ABNORMAL LOW (ref 36.0–46.0)
Hemoglobin: 10.7 g/dL — ABNORMAL LOW (ref 12.0–15.0)
Immature Granulocytes: 0 %
Lymphocytes Relative: 19 %
Lymphs Abs: 0.9 10*3/uL (ref 0.7–4.0)
MCH: 27.4 pg (ref 26.0–34.0)
MCHC: 32 g/dL (ref 30.0–36.0)
MCV: 85.6 fL (ref 80.0–100.0)
Monocytes Absolute: 0.4 10*3/uL (ref 0.1–1.0)
Monocytes Relative: 8 %
Neutro Abs: 3.6 10*3/uL (ref 1.7–7.7)
Neutrophils Relative %: 73 %
Platelets: 310 10*3/uL (ref 150–400)
RBC: 3.9 MIL/uL (ref 3.87–5.11)
RDW: 15 % (ref 11.5–15.5)
WBC: 5 10*3/uL (ref 4.0–10.5)
nRBC: 0 % (ref 0.0–0.2)

## 2020-11-16 LAB — POCT INR: INR: 3.3 — AB (ref 2.0–3.0)

## 2020-11-16 NOTE — Patient Instructions (Signed)
Still on prednisone taper ('20mg'$  x 2) Hold warfarin tonight then take '5mg'$  daily and recheck on Monday 11/20/20 Order given to [place]

## 2020-11-20 ENCOUNTER — Ambulatory Visit (INDEPENDENT_AMBULATORY_CARE_PROVIDER_SITE_OTHER): Payer: Medicare Other | Admitting: *Deleted

## 2020-11-20 DIAGNOSIS — Z5181 Encounter for therapeutic drug level monitoring: Secondary | ICD-10-CM | POA: Diagnosis not present

## 2020-11-20 DIAGNOSIS — D631 Anemia in chronic kidney disease: Secondary | ICD-10-CM | POA: Diagnosis not present

## 2020-11-20 DIAGNOSIS — I4892 Unspecified atrial flutter: Secondary | ICD-10-CM | POA: Diagnosis not present

## 2020-11-20 DIAGNOSIS — E1122 Type 2 diabetes mellitus with diabetic chronic kidney disease: Secondary | ICD-10-CM | POA: Diagnosis not present

## 2020-11-20 DIAGNOSIS — N183 Chronic kidney disease, stage 3 unspecified: Secondary | ICD-10-CM | POA: Diagnosis not present

## 2020-11-20 DIAGNOSIS — I4891 Unspecified atrial fibrillation: Secondary | ICD-10-CM | POA: Diagnosis not present

## 2020-11-20 DIAGNOSIS — I130 Hypertensive heart and chronic kidney disease with heart failure and stage 1 through stage 4 chronic kidney disease, or unspecified chronic kidney disease: Secondary | ICD-10-CM | POA: Diagnosis not present

## 2020-11-20 DIAGNOSIS — I5031 Acute diastolic (congestive) heart failure: Secondary | ICD-10-CM | POA: Diagnosis not present

## 2020-11-20 LAB — POCT INR: INR: 2.1 (ref 2.0–3.0)

## 2020-11-20 NOTE — Patient Instructions (Signed)
Finishes prednisone '10mg'$  on 2/9 Restart warfarin '5mg'$  daily except 7.'5mg'$  on Tuesdays, Thursdays and Saturdays Recheck in 1 wk Order given to [place]

## 2020-11-21 DIAGNOSIS — I252 Old myocardial infarction: Secondary | ICD-10-CM | POA: Diagnosis not present

## 2020-11-21 DIAGNOSIS — D631 Anemia in chronic kidney disease: Secondary | ICD-10-CM | POA: Diagnosis not present

## 2020-11-21 DIAGNOSIS — Z86711 Personal history of pulmonary embolism: Secondary | ICD-10-CM | POA: Diagnosis not present

## 2020-11-21 DIAGNOSIS — I2510 Atherosclerotic heart disease of native coronary artery without angina pectoris: Secondary | ICD-10-CM | POA: Diagnosis not present

## 2020-11-21 DIAGNOSIS — I4891 Unspecified atrial fibrillation: Secondary | ICD-10-CM | POA: Diagnosis not present

## 2020-11-21 DIAGNOSIS — Z7982 Long term (current) use of aspirin: Secondary | ICD-10-CM | POA: Diagnosis not present

## 2020-11-21 DIAGNOSIS — E1122 Type 2 diabetes mellitus with diabetic chronic kidney disease: Secondary | ICD-10-CM | POA: Diagnosis not present

## 2020-11-21 DIAGNOSIS — I5031 Acute diastolic (congestive) heart failure: Secondary | ICD-10-CM | POA: Diagnosis not present

## 2020-11-21 DIAGNOSIS — I350 Nonrheumatic aortic (valve) stenosis: Secondary | ICD-10-CM | POA: Diagnosis not present

## 2020-11-21 DIAGNOSIS — N183 Chronic kidney disease, stage 3 unspecified: Secondary | ICD-10-CM | POA: Diagnosis not present

## 2020-11-21 DIAGNOSIS — Z7984 Long term (current) use of oral hypoglycemic drugs: Secondary | ICD-10-CM | POA: Diagnosis not present

## 2020-11-21 DIAGNOSIS — Z955 Presence of coronary angioplasty implant and graft: Secondary | ICD-10-CM | POA: Diagnosis not present

## 2020-11-21 DIAGNOSIS — U071 COVID-19: Secondary | ICD-10-CM | POA: Diagnosis not present

## 2020-11-21 DIAGNOSIS — J9601 Acute respiratory failure with hypoxia: Secondary | ICD-10-CM | POA: Diagnosis not present

## 2020-11-21 DIAGNOSIS — Z5181 Encounter for therapeutic drug level monitoring: Secondary | ICD-10-CM | POA: Diagnosis not present

## 2020-11-21 DIAGNOSIS — Z7901 Long term (current) use of anticoagulants: Secondary | ICD-10-CM | POA: Diagnosis not present

## 2020-11-21 DIAGNOSIS — I130 Hypertensive heart and chronic kidney disease with heart failure and stage 1 through stage 4 chronic kidney disease, or unspecified chronic kidney disease: Secondary | ICD-10-CM | POA: Diagnosis not present

## 2020-11-23 DIAGNOSIS — D631 Anemia in chronic kidney disease: Secondary | ICD-10-CM | POA: Diagnosis not present

## 2020-11-23 DIAGNOSIS — I4891 Unspecified atrial fibrillation: Secondary | ICD-10-CM | POA: Diagnosis not present

## 2020-11-23 DIAGNOSIS — I5031 Acute diastolic (congestive) heart failure: Secondary | ICD-10-CM | POA: Diagnosis not present

## 2020-11-23 DIAGNOSIS — E1122 Type 2 diabetes mellitus with diabetic chronic kidney disease: Secondary | ICD-10-CM | POA: Diagnosis not present

## 2020-11-23 DIAGNOSIS — N183 Chronic kidney disease, stage 3 unspecified: Secondary | ICD-10-CM | POA: Diagnosis not present

## 2020-11-23 DIAGNOSIS — I130 Hypertensive heart and chronic kidney disease with heart failure and stage 1 through stage 4 chronic kidney disease, or unspecified chronic kidney disease: Secondary | ICD-10-CM | POA: Diagnosis not present

## 2020-11-27 ENCOUNTER — Ambulatory Visit (INDEPENDENT_AMBULATORY_CARE_PROVIDER_SITE_OTHER): Payer: Medicare Other | Admitting: *Deleted

## 2020-11-27 DIAGNOSIS — I5031 Acute diastolic (congestive) heart failure: Secondary | ICD-10-CM | POA: Diagnosis not present

## 2020-11-27 DIAGNOSIS — Z5181 Encounter for therapeutic drug level monitoring: Secondary | ICD-10-CM

## 2020-11-27 DIAGNOSIS — I4891 Unspecified atrial fibrillation: Secondary | ICD-10-CM | POA: Diagnosis not present

## 2020-11-27 DIAGNOSIS — I4892 Unspecified atrial flutter: Secondary | ICD-10-CM | POA: Diagnosis not present

## 2020-11-27 DIAGNOSIS — D631 Anemia in chronic kidney disease: Secondary | ICD-10-CM | POA: Diagnosis not present

## 2020-11-27 DIAGNOSIS — E1122 Type 2 diabetes mellitus with diabetic chronic kidney disease: Secondary | ICD-10-CM | POA: Diagnosis not present

## 2020-11-27 DIAGNOSIS — I130 Hypertensive heart and chronic kidney disease with heart failure and stage 1 through stage 4 chronic kidney disease, or unspecified chronic kidney disease: Secondary | ICD-10-CM | POA: Diagnosis not present

## 2020-11-27 DIAGNOSIS — N183 Chronic kidney disease, stage 3 unspecified: Secondary | ICD-10-CM | POA: Diagnosis not present

## 2020-11-27 LAB — POCT INR: INR: 3.8 — AB (ref 2.0–3.0)

## 2020-11-27 NOTE — Patient Instructions (Signed)
Hold warfarin tonight then decrease dose to '5mg'$  daily except 7.'5mg'$  on Wednesdays and Saturdays Recheck in 1 wk Order given to [place]

## 2020-11-28 DIAGNOSIS — I5042 Chronic combined systolic (congestive) and diastolic (congestive) heart failure: Secondary | ICD-10-CM

## 2020-12-04 ENCOUNTER — Ambulatory Visit (INDEPENDENT_AMBULATORY_CARE_PROVIDER_SITE_OTHER): Payer: Medicare Other | Admitting: *Deleted

## 2020-12-04 DIAGNOSIS — E1122 Type 2 diabetes mellitus with diabetic chronic kidney disease: Secondary | ICD-10-CM | POA: Diagnosis not present

## 2020-12-04 DIAGNOSIS — Z5181 Encounter for therapeutic drug level monitoring: Secondary | ICD-10-CM | POA: Diagnosis not present

## 2020-12-04 DIAGNOSIS — D631 Anemia in chronic kidney disease: Secondary | ICD-10-CM | POA: Diagnosis not present

## 2020-12-04 DIAGNOSIS — I130 Hypertensive heart and chronic kidney disease with heart failure and stage 1 through stage 4 chronic kidney disease, or unspecified chronic kidney disease: Secondary | ICD-10-CM | POA: Diagnosis not present

## 2020-12-04 DIAGNOSIS — N183 Chronic kidney disease, stage 3 unspecified: Secondary | ICD-10-CM | POA: Diagnosis not present

## 2020-12-04 DIAGNOSIS — I5031 Acute diastolic (congestive) heart failure: Secondary | ICD-10-CM | POA: Diagnosis not present

## 2020-12-04 DIAGNOSIS — I4892 Unspecified atrial flutter: Secondary | ICD-10-CM | POA: Diagnosis not present

## 2020-12-04 DIAGNOSIS — I4891 Unspecified atrial fibrillation: Secondary | ICD-10-CM | POA: Diagnosis not present

## 2020-12-04 LAB — POCT INR: INR: 3.9 — AB (ref 2.0–3.0)

## 2020-12-04 NOTE — Patient Instructions (Signed)
Hold warfarin tonight then decrease dose to '5mg'$  daily  Recheck in 1 wk Order given to [place]

## 2020-12-11 ENCOUNTER — Ambulatory Visit (INDEPENDENT_AMBULATORY_CARE_PROVIDER_SITE_OTHER): Payer: Medicare Other | Admitting: *Deleted

## 2020-12-11 DIAGNOSIS — N183 Chronic kidney disease, stage 3 unspecified: Secondary | ICD-10-CM | POA: Diagnosis not present

## 2020-12-11 DIAGNOSIS — I130 Hypertensive heart and chronic kidney disease with heart failure and stage 1 through stage 4 chronic kidney disease, or unspecified chronic kidney disease: Secondary | ICD-10-CM | POA: Diagnosis not present

## 2020-12-11 DIAGNOSIS — I4891 Unspecified atrial fibrillation: Secondary | ICD-10-CM | POA: Diagnosis not present

## 2020-12-11 DIAGNOSIS — E1122 Type 2 diabetes mellitus with diabetic chronic kidney disease: Secondary | ICD-10-CM | POA: Diagnosis not present

## 2020-12-11 DIAGNOSIS — Z5181 Encounter for therapeutic drug level monitoring: Secondary | ICD-10-CM | POA: Diagnosis not present

## 2020-12-11 DIAGNOSIS — I5031 Acute diastolic (congestive) heart failure: Secondary | ICD-10-CM | POA: Diagnosis not present

## 2020-12-11 DIAGNOSIS — I4892 Unspecified atrial flutter: Secondary | ICD-10-CM | POA: Diagnosis not present

## 2020-12-11 DIAGNOSIS — D631 Anemia in chronic kidney disease: Secondary | ICD-10-CM | POA: Diagnosis not present

## 2020-12-11 LAB — POCT INR: INR: 2.6 (ref 2.0–3.0)

## 2020-12-11 NOTE — Patient Instructions (Signed)
Continue warfarin '5mg'$  daily  Recheck in 1 wk Order given to [place]

## 2020-12-18 ENCOUNTER — Telehealth: Payer: Self-pay | Admitting: *Deleted

## 2020-12-18 ENCOUNTER — Ambulatory Visit (INDEPENDENT_AMBULATORY_CARE_PROVIDER_SITE_OTHER): Payer: Medicare Other | Admitting: *Deleted

## 2020-12-18 DIAGNOSIS — I4892 Unspecified atrial flutter: Secondary | ICD-10-CM | POA: Diagnosis not present

## 2020-12-18 DIAGNOSIS — I130 Hypertensive heart and chronic kidney disease with heart failure and stage 1 through stage 4 chronic kidney disease, or unspecified chronic kidney disease: Secondary | ICD-10-CM | POA: Diagnosis not present

## 2020-12-18 DIAGNOSIS — D631 Anemia in chronic kidney disease: Secondary | ICD-10-CM | POA: Diagnosis not present

## 2020-12-18 DIAGNOSIS — Z5181 Encounter for therapeutic drug level monitoring: Secondary | ICD-10-CM

## 2020-12-18 DIAGNOSIS — N183 Chronic kidney disease, stage 3 unspecified: Secondary | ICD-10-CM | POA: Diagnosis not present

## 2020-12-18 DIAGNOSIS — I4891 Unspecified atrial fibrillation: Secondary | ICD-10-CM | POA: Diagnosis not present

## 2020-12-18 DIAGNOSIS — E1122 Type 2 diabetes mellitus with diabetic chronic kidney disease: Secondary | ICD-10-CM | POA: Diagnosis not present

## 2020-12-18 DIAGNOSIS — I5031 Acute diastolic (congestive) heart failure: Secondary | ICD-10-CM | POA: Diagnosis not present

## 2020-12-18 LAB — POCT INR: INR: 2.5 (ref 2.0–3.0)

## 2020-12-18 NOTE — Patient Instructions (Signed)
Continue warfarin '5mg'$  daily  Recheck in 3 wk in office Order given to [facility]

## 2020-12-18 NOTE — Telephone Encounter (Signed)
Called and LM for Kristi with coumadin instructions and appt time for patient.  See coumadin note.

## 2020-12-18 NOTE — Telephone Encounter (Signed)
Kristi called to give Lattie Haw the PT and INR results for this patient. PT=29.4 INR=2.5  She said this is her last visit with this patient and would like Lattie Haw to call her back about setting the patient up to be seen in the office.

## 2020-12-26 DIAGNOSIS — G3184 Mild cognitive impairment, so stated: Secondary | ICD-10-CM | POA: Diagnosis not present

## 2020-12-26 DIAGNOSIS — F0150 Vascular dementia without behavioral disturbance: Secondary | ICD-10-CM | POA: Diagnosis not present

## 2021-01-01 DIAGNOSIS — E782 Mixed hyperlipidemia: Secondary | ICD-10-CM | POA: Diagnosis not present

## 2021-01-01 DIAGNOSIS — I129 Hypertensive chronic kidney disease with stage 1 through stage 4 chronic kidney disease, or unspecified chronic kidney disease: Secondary | ICD-10-CM | POA: Diagnosis not present

## 2021-01-01 DIAGNOSIS — R5383 Other fatigue: Secondary | ICD-10-CM | POA: Diagnosis not present

## 2021-01-01 DIAGNOSIS — D508 Other iron deficiency anemias: Secondary | ICD-10-CM | POA: Diagnosis not present

## 2021-01-01 DIAGNOSIS — I10 Essential (primary) hypertension: Secondary | ICD-10-CM | POA: Diagnosis not present

## 2021-01-01 DIAGNOSIS — F0150 Vascular dementia without behavioral disturbance: Secondary | ICD-10-CM | POA: Diagnosis not present

## 2021-01-01 DIAGNOSIS — I482 Chronic atrial fibrillation, unspecified: Secondary | ICD-10-CM | POA: Diagnosis not present

## 2021-01-01 DIAGNOSIS — E785 Hyperlipidemia, unspecified: Secondary | ICD-10-CM | POA: Diagnosis not present

## 2021-01-01 DIAGNOSIS — N1832 Chronic kidney disease, stage 3b: Secondary | ICD-10-CM | POA: Diagnosis not present

## 2021-01-01 DIAGNOSIS — Z8673 Personal history of transient ischemic attack (TIA), and cerebral infarction without residual deficits: Secondary | ICD-10-CM | POA: Diagnosis not present

## 2021-01-01 DIAGNOSIS — D649 Anemia, unspecified: Secondary | ICD-10-CM | POA: Diagnosis not present

## 2021-01-01 DIAGNOSIS — E1165 Type 2 diabetes mellitus with hyperglycemia: Secondary | ICD-10-CM | POA: Diagnosis not present

## 2021-01-08 ENCOUNTER — Other Ambulatory Visit: Payer: Self-pay

## 2021-01-08 ENCOUNTER — Ambulatory Visit (INDEPENDENT_AMBULATORY_CARE_PROVIDER_SITE_OTHER): Payer: Medicare Other | Admitting: *Deleted

## 2021-01-08 DIAGNOSIS — I4892 Unspecified atrial flutter: Secondary | ICD-10-CM | POA: Diagnosis not present

## 2021-01-08 DIAGNOSIS — Z5181 Encounter for therapeutic drug level monitoring: Secondary | ICD-10-CM | POA: Diagnosis not present

## 2021-01-08 LAB — POCT INR: INR: 2.3 (ref 2.0–3.0)

## 2021-01-08 NOTE — Patient Instructions (Signed)
Continue warfarin '5mg'$  daily  Recheck in 4 wk in office

## 2021-01-11 ENCOUNTER — Other Ambulatory Visit: Payer: Self-pay | Admitting: Cardiology

## 2021-01-30 ENCOUNTER — Emergency Department (HOSPITAL_COMMUNITY): Payer: Medicare Other

## 2021-01-30 ENCOUNTER — Inpatient Hospital Stay (HOSPITAL_COMMUNITY)
Admission: EM | Admit: 2021-01-30 | Discharge: 2021-02-06 | DRG: 280 | Disposition: A | Discharge status: Home / Self Care | Payer: Medicare Other | Attending: Family Medicine | Admitting: Family Medicine

## 2021-01-30 ENCOUNTER — Other Ambulatory Visit: Payer: Self-pay

## 2021-01-30 DIAGNOSIS — J90 Pleural effusion, not elsewhere classified: Secondary | ICD-10-CM | POA: Diagnosis not present

## 2021-01-30 DIAGNOSIS — R079 Chest pain, unspecified: Secondary | ICD-10-CM | POA: Diagnosis not present

## 2021-01-30 DIAGNOSIS — I161 Hypertensive emergency: Secondary | ICD-10-CM | POA: Diagnosis present

## 2021-01-30 DIAGNOSIS — L89151 Pressure ulcer of sacral region, stage 1: Secondary | ICD-10-CM | POA: Diagnosis present

## 2021-01-30 DIAGNOSIS — I5043 Acute on chronic combined systolic (congestive) and diastolic (congestive) heart failure: Secondary | ICD-10-CM | POA: Diagnosis present

## 2021-01-30 DIAGNOSIS — E1122 Type 2 diabetes mellitus with diabetic chronic kidney disease: Secondary | ICD-10-CM | POA: Diagnosis present

## 2021-01-30 DIAGNOSIS — I5023 Acute on chronic systolic (congestive) heart failure: Secondary | ICD-10-CM | POA: Diagnosis not present

## 2021-01-30 DIAGNOSIS — E1165 Type 2 diabetes mellitus with hyperglycemia: Secondary | ICD-10-CM | POA: Diagnosis present

## 2021-01-30 DIAGNOSIS — Z87891 Personal history of nicotine dependence: Secondary | ICD-10-CM

## 2021-01-30 DIAGNOSIS — I214 Non-ST elevation (NSTEMI) myocardial infarction: Secondary | ICD-10-CM | POA: Diagnosis present

## 2021-01-30 DIAGNOSIS — N1832 Chronic kidney disease, stage 3b: Secondary | ICD-10-CM | POA: Diagnosis present

## 2021-01-30 DIAGNOSIS — I517 Cardiomegaly: Secondary | ICD-10-CM | POA: Diagnosis not present

## 2021-01-30 DIAGNOSIS — Z7901 Long term (current) use of anticoagulants: Secondary | ICD-10-CM

## 2021-01-30 DIAGNOSIS — Z4682 Encounter for fitting and adjustment of non-vascular catheter: Secondary | ICD-10-CM | POA: Diagnosis not present

## 2021-01-30 DIAGNOSIS — Z7984 Long term (current) use of oral hypoglycemic drugs: Secondary | ICD-10-CM

## 2021-01-30 DIAGNOSIS — R0602 Shortness of breath: Secondary | ICD-10-CM | POA: Diagnosis not present

## 2021-01-30 DIAGNOSIS — E11649 Type 2 diabetes mellitus with hypoglycemia without coma: Secondary | ICD-10-CM | POA: Diagnosis not present

## 2021-01-30 DIAGNOSIS — I350 Nonrheumatic aortic (valve) stenosis: Secondary | ICD-10-CM | POA: Diagnosis present

## 2021-01-30 DIAGNOSIS — G9340 Encephalopathy, unspecified: Secondary | ICD-10-CM | POA: Diagnosis present

## 2021-01-30 DIAGNOSIS — Z86711 Personal history of pulmonary embolism: Secondary | ICD-10-CM

## 2021-01-30 DIAGNOSIS — I5030 Unspecified diastolic (congestive) heart failure: Secondary | ICD-10-CM | POA: Diagnosis not present

## 2021-01-30 DIAGNOSIS — I252 Old myocardial infarction: Secondary | ICD-10-CM

## 2021-01-30 DIAGNOSIS — K219 Gastro-esophageal reflux disease without esophagitis: Secondary | ICD-10-CM | POA: Diagnosis present

## 2021-01-30 DIAGNOSIS — R0789 Other chest pain: Secondary | ICD-10-CM | POA: Diagnosis not present

## 2021-01-30 DIAGNOSIS — I255 Ischemic cardiomyopathy: Secondary | ICD-10-CM | POA: Diagnosis present

## 2021-01-30 DIAGNOSIS — Z79899 Other long term (current) drug therapy: Secondary | ICD-10-CM

## 2021-01-30 DIAGNOSIS — I130 Hypertensive heart and chronic kidney disease with heart failure and stage 1 through stage 4 chronic kidney disease, or unspecified chronic kidney disease: Secondary | ICD-10-CM | POA: Diagnosis present

## 2021-01-30 DIAGNOSIS — R579 Shock, unspecified: Secondary | ICD-10-CM | POA: Diagnosis present

## 2021-01-30 DIAGNOSIS — I4892 Unspecified atrial flutter: Secondary | ICD-10-CM | POA: Diagnosis present

## 2021-01-30 DIAGNOSIS — R4020 Unspecified coma: Secondary | ICD-10-CM | POA: Diagnosis not present

## 2021-01-30 DIAGNOSIS — Z8673 Personal history of transient ischemic attack (TIA), and cerebral infarction without residual deficits: Secondary | ICD-10-CM | POA: Diagnosis not present

## 2021-01-30 DIAGNOSIS — I4891 Unspecified atrial fibrillation: Secondary | ICD-10-CM | POA: Diagnosis present

## 2021-01-30 DIAGNOSIS — R918 Other nonspecific abnormal finding of lung field: Secondary | ICD-10-CM | POA: Diagnosis not present

## 2021-01-30 DIAGNOSIS — R059 Cough, unspecified: Secondary | ICD-10-CM | POA: Diagnosis not present

## 2021-01-30 DIAGNOSIS — J9602 Acute respiratory failure with hypercapnia: Secondary | ICD-10-CM | POA: Diagnosis present

## 2021-01-30 DIAGNOSIS — I5033 Acute on chronic diastolic (congestive) heart failure: Secondary | ICD-10-CM | POA: Diagnosis not present

## 2021-01-30 DIAGNOSIS — Z20822 Contact with and (suspected) exposure to covid-19: Secondary | ICD-10-CM | POA: Diagnosis present

## 2021-01-30 DIAGNOSIS — I2510 Atherosclerotic heart disease of native coronary artery without angina pectoris: Secondary | ICD-10-CM | POA: Diagnosis present

## 2021-01-30 DIAGNOSIS — R0902 Hypoxemia: Secondary | ICD-10-CM | POA: Diagnosis not present

## 2021-01-30 DIAGNOSIS — Z7902 Long term (current) use of antithrombotics/antiplatelets: Secondary | ICD-10-CM

## 2021-01-30 DIAGNOSIS — J9601 Acute respiratory failure with hypoxia: Secondary | ICD-10-CM | POA: Diagnosis present

## 2021-01-30 DIAGNOSIS — Z955 Presence of coronary angioplasty implant and graft: Secondary | ICD-10-CM

## 2021-01-30 DIAGNOSIS — Z8249 Family history of ischemic heart disease and other diseases of the circulatory system: Secondary | ICD-10-CM

## 2021-01-30 DIAGNOSIS — I110 Hypertensive heart disease with heart failure: Secondary | ICD-10-CM | POA: Diagnosis not present

## 2021-01-30 DIAGNOSIS — I447 Left bundle-branch block, unspecified: Secondary | ICD-10-CM | POA: Diagnosis present

## 2021-01-30 DIAGNOSIS — E785 Hyperlipidemia, unspecified: Secondary | ICD-10-CM | POA: Diagnosis present

## 2021-01-30 DIAGNOSIS — I509 Heart failure, unspecified: Secondary | ICD-10-CM

## 2021-01-30 DIAGNOSIS — Z833 Family history of diabetes mellitus: Secondary | ICD-10-CM

## 2021-01-30 DIAGNOSIS — F0150 Vascular dementia without behavioral disturbance: Secondary | ICD-10-CM | POA: Diagnosis present

## 2021-01-30 HISTORY — DX: Disorder of arteries and arterioles, unspecified: I77.9

## 2021-01-30 HISTORY — DX: Vascular dementia, unspecified severity, without behavioral disturbance, psychotic disturbance, mood disturbance, and anxiety: F01.50

## 2021-01-30 HISTORY — DX: Nonrheumatic aortic (valve) stenosis: I35.0

## 2021-01-30 HISTORY — DX: Chronic kidney disease, stage 3 unspecified: N18.30

## 2021-01-30 HISTORY — DX: Unspecified systolic (congestive) heart failure: I50.20

## 2021-01-30 LAB — COMPREHENSIVE METABOLIC PANEL
ALT: 20 U/L (ref 0–44)
AST: 29 U/L (ref 15–41)
Albumin: 3.6 g/dL (ref 3.5–5.0)
Alkaline Phosphatase: 93 U/L (ref 38–126)
Anion gap: 9 (ref 5–15)
BUN: 32 mg/dL — ABNORMAL HIGH (ref 8–23)
CO2: 29 mmol/L (ref 22–32)
Calcium: 8.9 mg/dL (ref 8.9–10.3)
Chloride: 96 mmol/L — ABNORMAL LOW (ref 98–111)
Creatinine, Ser: 1.65 mg/dL — ABNORMAL HIGH (ref 0.44–1.00)
GFR, Estimated: 31 mL/min — ABNORMAL LOW (ref >60)
Glucose, Bld: 302 mg/dL — ABNORMAL HIGH (ref 70–99)
Potassium: 4.6 mmol/L (ref 3.5–5.1)
Sodium: 134 mmol/L — ABNORMAL LOW (ref 135–145)
Total Bilirubin: 0.6 mg/dL (ref 0.3–1.2)
Total Protein: 7.7 g/dL (ref 6.5–8.1)

## 2021-01-30 LAB — CBC WITH DIFFERENTIAL/PLATELET
Abs Immature Granulocytes: 0.01 10*3/uL (ref 0.00–0.07)
Basophils Absolute: 0.1 10*3/uL (ref 0.0–0.1)
Basophils Relative: 2 %
Eosinophils Absolute: 0.2 10*3/uL (ref 0.0–0.5)
Eosinophils Relative: 3 %
HCT: 41.5 % (ref 36.0–46.0)
Hemoglobin: 13 g/dL (ref 12.0–15.0)
Immature Granulocytes: 0 %
Lymphocytes Relative: 55 %
Lymphs Abs: 4.1 10*3/uL — ABNORMAL HIGH (ref 0.7–4.0)
MCH: 29 pg (ref 26.0–34.0)
MCHC: 31.3 g/dL (ref 30.0–36.0)
MCV: 92.4 fL (ref 80.0–100.0)
Monocytes Absolute: 0.4 10*3/uL (ref 0.1–1.0)
Monocytes Relative: 6 %
Neutro Abs: 2.6 10*3/uL (ref 1.7–7.7)
Neutrophils Relative %: 34 %
Platelets: 365 10*3/uL (ref 150–400)
RBC: 4.49 MIL/uL (ref 3.87–5.11)
RDW: 14.1 % (ref 11.5–15.5)
WBC: 7.5 10*3/uL (ref 4.0–10.5)
nRBC: 0 % (ref 0.0–0.2)

## 2021-01-30 LAB — BRAIN NATRIURETIC PEPTIDE: B Natriuretic Peptide: 587.4 pg/mL — ABNORMAL HIGH (ref 0.0–100.0)

## 2021-01-30 LAB — CBG MONITORING, ED: Glucose-Capillary: 280 mg/dL — ABNORMAL HIGH (ref 70–99)

## 2021-01-30 LAB — I-STAT CHEM 8, ED
BUN: 41 mg/dL — ABNORMAL HIGH (ref 8–23)
Calcium, Ion: 1.15 mmol/L (ref 1.15–1.40)
Chloride: 98 mmol/L (ref 98–111)
Creatinine, Ser: 1.5 mg/dL — ABNORMAL HIGH (ref 0.44–1.00)
Glucose, Bld: 314 mg/dL — ABNORMAL HIGH (ref 70–99)
HCT: 42 % (ref 36.0–46.0)
Hemoglobin: 14.3 g/dL (ref 12.0–15.0)
Potassium: 4.7 mmol/L (ref 3.5–5.1)
Sodium: 135 mmol/L (ref 135–145)
TCO2: 31 mmol/L (ref 22–32)

## 2021-01-30 LAB — CBC
HCT: 38.8 % (ref 36.0–46.0)
Hemoglobin: 12.1 g/dL (ref 12.0–15.0)
MCH: 29.1 pg (ref 26.0–34.0)
MCHC: 31.2 g/dL (ref 30.0–36.0)
MCV: 93.3 fL (ref 80.0–100.0)
Platelets: 318 10*3/uL (ref 150–400)
RBC: 4.16 MIL/uL (ref 3.87–5.11)
RDW: 14.2 % (ref 11.5–15.5)
WBC: 7.2 10*3/uL (ref 4.0–10.5)
nRBC: 0 % (ref 0.0–0.2)

## 2021-01-30 LAB — I-STAT ARTERIAL BLOOD GAS, ED
Acid-Base Excess: 2 mmol/L (ref 0.0–2.0)
Bicarbonate: 30.4 mmol/L — ABNORMAL HIGH (ref 20.0–28.0)
Calcium, Ion: 1.16 mmol/L (ref 1.15–1.40)
HCT: 36 % (ref 36.0–46.0)
Hemoglobin: 12.2 g/dL (ref 12.0–15.0)
O2 Saturation: 99 %
Patient temperature: 97.2
Potassium: 4.5 mmol/L (ref 3.5–5.1)
Sodium: 133 mmol/L — ABNORMAL LOW (ref 135–145)
TCO2: 32 mmol/L (ref 22–32)
pCO2 arterial: 64.5 mmHg — ABNORMAL HIGH (ref 32.0–48.0)
pH, Arterial: 7.277 — ABNORMAL LOW (ref 7.350–7.450)
pO2, Arterial: 153 mmHg — ABNORMAL HIGH (ref 83.0–108.0)

## 2021-01-30 LAB — URINALYSIS, ROUTINE W REFLEX MICROSCOPIC
Bacteria, UA: NONE SEEN
Bilirubin Urine: NEGATIVE
Glucose, UA: >=500 mg/dL — AB
Hgb urine dipstick: NEGATIVE
Ketones, ur: NEGATIVE mg/dL
Leukocytes,Ua: NEGATIVE
Nitrite: NEGATIVE
Protein, ur: 100 mg/dL — AB
Specific Gravity, Urine: 1.01 (ref 1.005–1.030)
pH: 5 (ref 5.0–8.0)

## 2021-01-30 LAB — PROTIME-INR
INR: 1.7 — ABNORMAL HIGH (ref 0.8–1.2)
Prothrombin Time: 19.8 seconds — ABNORMAL HIGH (ref 11.4–15.2)

## 2021-01-30 LAB — LACTIC ACID, PLASMA
Lactic Acid, Venous: 2.3 mmol/L (ref 0.5–1.9)
Lactic Acid, Venous: 2.1 mmol/L (ref 0.5–1.9)

## 2021-01-30 LAB — RESP PANEL BY RT-PCR (FLU A&B, COVID) ARPGX2
Influenza A by PCR: NEGATIVE
Influenza B by PCR: NEGATIVE
SARS Coronavirus 2 by RT PCR: NEGATIVE

## 2021-01-30 LAB — TROPONIN I (HIGH SENSITIVITY)
Troponin I (High Sensitivity): 23 ng/L — ABNORMAL HIGH (ref <18)
Troponin I (High Sensitivity): 64 ng/L — ABNORMAL HIGH (ref <18)

## 2021-01-30 LAB — CREATININE, SERUM
Creatinine, Ser: 1.53 mg/dL — ABNORMAL HIGH (ref 0.44–1.00)
GFR, Estimated: 34 mL/min — ABNORMAL LOW (ref >60)

## 2021-01-30 MED ORDER — PROPOFOL 1000 MG/100ML IV EMUL
INTRAVENOUS | Status: AC | PRN
  Administered 2021-01-30: 10 ug/kg/min via INTRAVENOUS

## 2021-01-30 MED ORDER — FUROSEMIDE 10 MG/ML IJ SOLN
60.0000 mg | Freq: Once | INTRAMUSCULAR | Status: AC
  Administered 2021-01-30: 60 mg via INTRAVENOUS
  Filled 2021-01-30: qty 6

## 2021-01-30 MED ORDER — DOCUSATE SODIUM 100 MG PO CAPS
100.0000 mg | ORAL_CAPSULE | Freq: Two times a day (BID) | ORAL | Status: DC | PRN
  Administered 2021-01-31: 100 mg via ORAL

## 2021-01-30 MED ORDER — ETOMIDATE 2 MG/ML IV SOLN
INTRAVENOUS | Status: AC | PRN
  Administered 2021-01-30: 20 mg via INTRAVENOUS

## 2021-01-30 MED ORDER — NOREPINEPHRINE 4 MG/250ML-% IV SOLN
INTRAVENOUS | Status: AC
  Administered 2021-01-30: 2 ug/min via INTRAVENOUS
  Filled 2021-01-30: qty 250

## 2021-01-30 MED ORDER — MIDAZOLAM HCL 2 MG/2ML IJ SOLN
1.0000 mg | INTRAMUSCULAR | Status: DC | PRN
  Administered 2021-01-30: 1 mg via INTRAVENOUS

## 2021-01-30 MED ORDER — DOCUSATE SODIUM 50 MG/5ML PO LIQD
100.0000 mg | Freq: Two times a day (BID) | ORAL | Status: DC
  Administered 2021-01-31: 100 mg
  Filled 2021-01-30: qty 10

## 2021-01-30 MED ORDER — POLYETHYLENE GLYCOL 3350 17 G PO PACK: 17.0000 g | PACK | Freq: Every day | ORAL | Status: DC | PRN

## 2021-01-30 MED ORDER — ONDANSETRON HCL 4 MG/2ML IJ SOLN: 4.0000 mg | Freq: Four times a day (QID) | INTRAMUSCULAR | Status: DC | PRN

## 2021-01-30 MED ORDER — FENTANYL 2500MCG IN NS 250ML (10MCG/ML) PREMIX INFUSION
25.0000 ug/h | INTRAVENOUS | Status: DC
  Administered 2021-01-30: 25 ug/h via INTRAVENOUS
  Filled 2021-01-30: qty 250

## 2021-01-30 MED ORDER — FENTANYL CITRATE (PF) 100 MCG/2ML IJ SOLN
25.0000 ug | Freq: Once | INTRAMUSCULAR | Status: AC
Start: 2021-01-30 — End: 2021-01-30
  Administered 2021-01-30: 25 ug via INTRAVENOUS
  Filled 2021-01-30: qty 2

## 2021-01-30 MED ORDER — HEPARIN SODIUM (PORCINE) 5000 UNIT/ML IJ SOLN: 5000.0000 [IU] | Freq: Three times a day (TID) | INTRAMUSCULAR | Status: DC

## 2021-01-30 MED ORDER — INSULIN ASPART 100 UNIT/ML ~~LOC~~ SOLN
0.0000 [IU] | SUBCUTANEOUS | Status: DC
  Administered 2021-01-31: 5 [IU] via SUBCUTANEOUS
  Administered 2021-01-31: 2 [IU] via SUBCUTANEOUS
  Administered 2021-01-31: 5 [IU] via SUBCUTANEOUS
  Administered 2021-01-31 (×3): 2 [IU] via SUBCUTANEOUS

## 2021-01-30 MED ORDER — HEPARIN (PORCINE) 25000 UT/250ML-% IV SOLN
1350.0000 [IU]/h | INTRAVENOUS | Status: DC
  Administered 2021-01-31: 1350 [IU]/h via INTRAVENOUS
  Administered 2021-01-31: 1000 [IU]/h via INTRAVENOUS
  Filled 2021-01-30 (×3): qty 250

## 2021-01-30 MED ORDER — HEPARIN BOLUS VIA INFUSION
3500.0000 [IU] | Freq: Once | INTRAVENOUS | Status: AC
  Administered 2021-01-31: 3500 [IU] via INTRAVENOUS
  Filled 2021-01-30: qty 3500

## 2021-01-30 MED ORDER — FENTANYL BOLUS VIA INFUSION
25.0000 ug | INTRAVENOUS | Status: DC | PRN
Start: 2021-01-30 — End: 2021-01-31
  Filled 2021-01-30: qty 100

## 2021-01-30 MED ORDER — POLYETHYLENE GLYCOL 3350 17 G PO PACK
17.0000 g | PACK | Freq: Every day | ORAL | Status: DC
  Administered 2021-01-31: 17 g
  Filled 2021-01-30: qty 1

## 2021-01-30 MED ORDER — MIDAZOLAM HCL 2 MG/2ML IJ SOLN
1.0000 mg | INTRAMUSCULAR | Status: DC | PRN
  Filled 2021-01-30: qty 2

## 2021-01-30 MED ORDER — PANTOPRAZOLE SODIUM 40 MG IV SOLR
40.0000 mg | Freq: Every day | INTRAVENOUS | Status: DC
  Administered 2021-01-30 – 2021-02-01 (×3): 40 mg via INTRAVENOUS
  Filled 2021-01-30 (×3): qty 40

## 2021-01-30 MED ORDER — ACETAMINOPHEN 325 MG PO TABS: 650.0000 mg | ORAL_TABLET | ORAL | Status: DC | PRN

## 2021-01-30 MED ORDER — ROCURONIUM BROMIDE 50 MG/5ML IV SOLN
INTRAVENOUS | Status: AC | PRN
Start: 2021-01-30 — End: 2021-01-30
  Administered 2021-01-30: 80 mg via INTRAVENOUS

## 2021-01-30 NOTE — ED Notes (Signed)
[facility] [phone], son provided update at this time.

## 2021-01-30 NOTE — Progress Notes (Signed)
ANTICOAGULATION CONSULT NOTE - Initial Consult  Pharmacy Consult for heparin Indication: Hx PE, aflutter  No Known Allergies  Patient Measurements: Height: '5\' 5"'$  (165.1 cm) IBW/kg (Calculated) : 57 Heparin Dosing Weight: 73kg  Vital Signs: Temp: 97.5 F (36.4 C) (04/19 2130) Temp Source: Tympanic (04/19 2005) BP: 174/89 (04/19 2130) Pulse Rate: 82 (04/19 2130)  Labs: Recent Labs    01/30/21 1957 01/30/21 2010 01/30/21 2044  HGB 14.3 13.0 12.2  HCT 42.0 41.5 36.0  PLT  --  365  --   LABPROT  --  19.8*  --   INR  --  1.7*  --   CREATININE 1.50* 1.65*  --   TROPONINIHS  --  23*  --     CrCl cannot be calculated (Unknown ideal weight.).   Medical History: Past Medical History:  Diagnosis Date  . Arthritis   . Atrial flutter ([place])   . Bilateral pulmonary embolism ([place]) 08/2008   On coumadin  . Coronary artery disease    s/p DES to mid RCA in 02/2008; s/p inferior MI with DES to mid RCA and mid LAD in 05/2005; DES to mid RCA 07/2020  . Essential hypertension   . Hyperlipidemia   . Ischemic cardiomyopathy   . Memory disorder 07/08/2017  . Myocardial infarction ([place])   . Stroke ([place])   . Type 2 diabetes mellitus ([facility])    Assessment: 57 YOF presenting with SOB, hx of PE and aflutter on warfarin PTA.  INR subtherapeutic on admission at 1.7 so will proceed with heparin gtt while holding warfarin for possible procedures.  CBC wnl.    Goal of Therapy:  Heparin level 0.3-0.7 units/ml Monitor platelets by anticoagulation protocol: Yes   Plan:  Heparin 3500 units IV x 1, and gtt at 1000 units/hr F/u 8 hour heparin level  Bertis Ruddy, PharmD Clinical Pharmacist ED Pharmacist Phone # [phone] 01/30/2021 10:18 PM

## 2021-01-30 NOTE — ED Notes (Signed)
CODE STEMI CANCELLED AT THIS TIME

## 2021-01-30 NOTE — H&P (Signed)
NAME:  [name], MRN:  [id number], DOB:  1941-04-28, LOS: 0 ADMISSION DATE:  01/30/2021, CONSULTATION DATE:  01/30/21 REFERRING MD:  Dr Colvin Caroli, CHIEF COMPLAINT:  Sob and resp failure  History of Present Illness:  80 yo female with pmh afib on chronic coumadin, h/o pe's, h/o cva, hyperlipidemia, HTN, cad s/p DES to RCA 2006/2009/2021, HFpEF, dm2 who presented with progressive sob and hypoxic resp failure.   All history is obtained from chart review, son and ed physician as pt is intubated and sedated at this time.   Per son pt has complained of sob for past few days that has progressively worsened. No f/c, cough but not different from baseline. Pt has known cardiac history and today sob progressed to point she called EMS. When they arrived pt's sats were 78% and was placed on NIV with improvement in saturations to 90's but subsequently pt became unresponsive. Ems transitioned her to NRB with stable saturations but due to GCS 3 on arrival pt was intubated for airway protection.   CXR revealed pulmonary edema. She has thin frothy sputum in copious quantities coming from ett. She is unresponsive but s/p paralytic for intubation.    Pertinent  Medical History  HFpEF CAD s/p mutiple PCI with DES to RCA  afib B/l pe [id number]  Significant Hospital Events: Including procedures, antibiotic start and stop dates in addition to other pertinent events   . 4/19 admitted  Interim History / Subjective:  As above  Objective   Blood pressure (!) 168/79, pulse 83, temperature (!) 97.5 F (36.4 C), resp. rate 16, height '5\' 5"'$  (1.651 m), SpO2 100 %.    Vent Mode: PRVC FiO2 (%):  [80 %-100 %] 80 % Set Rate:  [18 bmp-22 bmp] 22 bmp Vt Set:  [460 mL] 460 mL PEEP:  [5 cmH20] 5 cmH20 Plateau Pressure:  [28 cmH20] 28 cmH20  No intake or output data in the 24 hours ending 01/30/21 2107 There were no vitals filed for this visit.  Examination: General: sedated unresponsive on vent HENT: ncat,  perrla, mmmp, thin frothy secretions from ett Lungs: rales and coarse bilaterally Cardiovascular: rrr with pvc Abdomen: obese, protuberant, bs + Extremities: no c/c/e Neuro: unresponsive GU: deferred  Labs/imaging that I havepersonally reviewed  (right click and "Reselect all SmartList Selections" daily)  Cr 1.65 (baseline 1.5), Na 133, Gluc 302, BNP 587, Trop 23, Lactate 2.3, INR 1.7  cxr with b/l edema, personally reviewed by me  Resolved Hospital Problem list     Assessment & Plan:  Acute on chronic HFpEF:  -repeat echo pending -diurese as renal function tolerates -follow trop H/o htn -holding home anti htn for now Afib -INR subtherapeutic at 1.7 -will start heparin gtt Cad:  -cont home meds as able and resume held anti-htn agents as tolerated -trend trop -repeat echo pending.  -code stemi canceled in ed, but on heparin gtt due to need for chronic a/c anyway.  -new lbb on ekg after review back thru 8/19  Acute hypoxic resp failure:  -s/p intubation.  -titrate vent -protocols in effect H/o pe:  -on chronic coumadin, held at this time -heparin gtt on board  T2dm, with hyperglycemia:  -a1c in January 7.3 -cont ssi   Best practice (right click and "Reselect all SmartList Selections" daily)  Diet:  NPO Pain/Anxiety/Delirium protocol (if indicated): Yes (RASS goal -1) VAP protocol (if indicated): Yes DVT prophylaxis: SCD and Systemic AC GI prophylaxis: PPI Glucose control:  SSI Yes Central venous access:  N/A Arterial line:  N/A Foley:  Yes, and it is still needed Mobility:  bed rest  PT consulted: N/A Last date of multidisciplinary goals of care discussion [pending] Code Status:  limited Disposition: ICU  Labs   CBC: Recent Labs  Lab 01/30/21 1957 01/30/21 2010 01/30/21 2044  WBC  --  7.5  --   NEUTROABS  --  2.6  --   HGB 14.3 13.0 12.2  HCT 42.0 41.5 36.0  MCV  --  92.4  --   PLT  --  365  --     Basic Metabolic Panel: Recent Labs  Lab  01/30/21 1957 01/30/21 2044  NA 135 133*  K 4.7 4.5  CL 98  --   GLUCOSE 314*  --   BUN 41*  --   CREATININE 1.50*  --    GFR: CrCl cannot be calculated (Unknown ideal weight.). Recent Labs  Lab 01/30/21 2010  WBC 7.5    Liver Function Tests: No results for input(s): AST, ALT, ALKPHOS, BILITOT, PROT, ALBUMIN in the last 168 hours. No results for input(s): LIPASE, AMYLASE in the last 168 hours. No results for input(s): AMMONIA in the last 168 hours.  ABG    Component Value Date/Time   PHART 7.277 (L) 01/30/2021 2044   PCO2ART 64.5 (H) 01/30/2021 2044   PO2ART 153 (H) 01/30/2021 2044   HCO3 30.4 (H) 01/30/2021 2044   TCO2 32 01/30/2021 2044   ACIDBASEDEF 1.0 07/14/2020 1301   O2SAT 99.0 01/30/2021 2044     Coagulation Profile: Recent Labs  Lab 01/30/21 2010  INR 1.7*    Cardiac Enzymes: No results for input(s): CKTOTAL, CKMB, CKMBINDEX, TROPONINI in the last 168 hours.  HbA1C: Hgb A1c MFr Bld  Date/Time Value Ref Range Status  11/10/2020 06:50 AM 7.3 (H) 4.8 - 5.6 % Final    Comment:    (NOTE) Pre diabetes:          5.7%-6.4%  Diabetes:              >6.4%  Glycemic control for   <7.0% adults with diabetes   07/14/2020 12:38 AM 7.3 (H) 4.8 - 5.6 % Final    Comment:    (NOTE) Pre diabetes:          5.7%-6.4%  Diabetes:              >6.4%  Glycemic control for   <7.0% adults with diabetes     CBG: Recent Labs  Lab 01/30/21 1944  GLUCAP 280*    Review of Systems:   Unobtainable 2/2 intubation and sedation  Past Medical History:  She,  has a past medical history of Arthritis, Atrial flutter ([place]), Bilateral pulmonary embolism ([place]) (08/2008), Coronary artery disease, Essential hypertension, Hyperlipidemia, Ischemic cardiomyopathy, Memory disorder (07/08/2017), Myocardial infarction [facility]), Stroke ([place]), and Type 2 diabetes mellitus ([place]).   Surgical History:   Past Surgical History:  Procedure Laterality Date  . ATRIAL FLUTTER ABLATION N/A  06/17/2013   Procedure: ATRIAL FLUTTER ABLATION;  Surgeon: Thompson Grayer, MD;  Location: [facility] CATH LAB;  Service: Cardiovascular;  Laterality: N/A;  . CAROTID PTA/STENT INTERVENTION N/A 04/10/2017   Procedure: Carotid PTA/Stent Intervention;  Surgeon: Algernon Huxley, MD;  Location: [facility] CV LAB;  Service: Cardiovascular;  Laterality: N/A;  . CHOLECYSTECTOMY    . CORONARY STENT INTERVENTION N/A 07/14/2020   Procedure: CORONARY STENT INTERVENTION;  Surgeon: Sherren Mocha, MD;  Location: [place] CV LAB;  Service: Cardiovascular;  Laterality: N/A;  .  HIP SURGERY    . KNEE ARTHROSCOPY WITH MEDIAL MENISECTOMY Left 09/25/2017   Procedure: KNEE ARTHROSCOPY WITH MEDIAL AND LATERAL MENISECTOMY;  Surgeon: [name], MD;  Location: [facility] ORS;  Service: Orthopedics;  Laterality: Left;  . LEFT HEART CATH AND CORONARY ANGIOGRAPHY N/A 07/14/2020   Procedure: LEFT HEART CATH AND CORONARY ANGIOGRAPHY;  Surgeon: Sherren Mocha, MD;  Location: [facility] CV LAB;  Service: Cardiovascular;  Laterality: N/A;     Social History:   reports that she quit smoking about 42 years ago. Her smoking use included cigarettes. She quit after 10.00 years of use. She has never used smokeless tobacco. She reports that she does not drink alcohol and does not use drugs.   Family History:  Her family history includes Arthritis in an other family member; Cancer in an other family member; Diabetes in an other family member; Heart disease in an other family member; Heart failure in her mother.   Allergies No Known Allergies   Home Medications  Prior to Admission medications   Medication Sig Start Date End Date Taking? Authorizing Provider  albuterol (VENTOLIN HFA) 108 (90 Base) MCG/ACT inhaler Inhale 2 puffs into the lungs every 6 (six) hours as needed for wheezing or shortness of breath. 11/11/20   Manuella Ghazi, Pratik D, DO  ALPRAZolam Duanne Moron) 0.25 MG tablet Take 0.125-0.25 mg by mouth 2 (two) times daily as needed for anxiety  or sleep. 06/29/20   [provider]  Ascorbic Acid (VITAMIN C GUMMIES PO) Take 2 each by mouth daily. 180 mg    [provider]  atorvastatin (LIPITOR) 20 MG tablet Take 20 mg by mouth daily.    [provider]  b complex vitamins capsule Take 1 capsule by mouth daily.    [provider]  benzonatate (TESSALON) 100 MG capsule Take 100 mg by mouth 3 (three) times daily as needed for cough. 11/06/20   [provider]  carvedilol (COREG) 6.25 MG tablet Take 1 tablet (6.25 mg total) by mouth 2 (two) times daily. 10/03/20   Satira Sark, MD  cholecalciferol (VITAMIN D3) 25 MCG (1000 UNIT) tablet Take 1,000 Units by mouth daily.    [provider]  clopidogrel (PLAVIX) 75 MG tablet Take 1 tablet by mouth once daily with breakfast 01/11/21   Kathyrn Drown D, NP  Coenzyme Q10 (CO Q 10) 100 MG CAPS Take 100 mg by mouth daily.    [provider]  furosemide (LASIX) 20 MG tablet Take 1 tablet (20 mg total) by mouth daily. 09/19/20 12/18/20  Satira Sark, MD  glipiZIDE (GLUCOTROL XL) 5 MG 24 hr tablet Take 5 mg by mouth daily. 09/12/20   [provider]  guaiFENesin-dextromethorphan (ROBITUSSIN DM) 100-10 MG/5ML syrup Take 5 mLs by mouth every 4 (four) hours as needed for cough. 11/11/20   Manuella Ghazi, Pratik D, DO  MEGARED OMEGA-3 KRILL OIL PO Take 1,000 mg by mouth daily. When she remembers    [provider]  nitroGLYCERIN (NITROSTAT) 0.4 MG SL tablet Place 1 tablet (0.4 mg total) under the tongue every 5 (five) minutes x 3 doses as needed for chest pain. 08/03/20   Verta Ellen., NP  vitamin E 180 MG (400 UNITS) capsule Take 400 Units by mouth daily.    [provider]  warfarin (COUMADIN) 5 MG tablet Take 1-1.5 tablets by mouth as directed. 1 tablet by mouth Sunday-Friday and 1.5 tablets by mouth on Saturday 05/31/18   [provider]  Critical care time: The patient is critically ill with multiple  organ systems failure and requires high complexity decision making for assessment and support, frequent evaluation and titration of therapies, application of advanced monitoring technologies and extensive interpretation of multiple databases.  Critical care time 35 mins. This represents my time independent of the NPs time taking care of the pt. This is excluding procedures.    Audria Nine DO [facility] 01/30/2021, 9:07 PM See Amion for pager If no response to pager, please call [phone] until 1900 After 1900 please call [facility] [phone]

## 2021-01-30 NOTE — ED Notes (Signed)
Critical lab called Trop 64

## 2021-01-30 NOTE — ED Provider Notes (Signed)
[facility] DEPARTMENT Provider Note   CSN: [id number] Arrival date & time: 01/30/21  1943     History Chief Complaint  Patient presents with  . Shortness of Breath  . Code STEMI    [name] is a 80 y.o. female.  HPI Patient could not provide any history.  On arrival she was in significant respiratory distress and altered mental status.  EMS reports they were called for shortness of breath.  On arrival patient's oxygen saturation was at 78%.  She was up and ambulatory.  Patient was diaphoretic.  EMS was able to place CPAP with improvement of oxygen saturation 96%.  However, during transport patient's mental status began to decline and she became poorly responsive not answering any questions.  At that time EMS transferred to 15 L nonrebreather mask.  Reportedly patient was not complaining of chest pain but was having significant shortness of breath.  Patient's son arrived after patient arrival to the emergency department.  He confirms that his mother does have congestive heart failure and has been somewhat short of breath for several days.  EMS transmitted an EKG was concern for possible STEMI.  EKG was consistent with a left bundle branch block.  Was able to compare this to prior EKG.  Transmitted EKG did show some increased ST segment elevation anteriorly compared to older tracings.  In combination with patient's diaphoretic severe dyspneic presentation, code STEMI was called.  However, on arrival to the emergency department repeat EKG is not suggestive of STEMI.  Code STEMI canceled..  Case was reviewed with Dr. Julianne Handler.     Past Medical History:  Diagnosis Date  . Arthritis   . Atrial flutter ([place])   . Bilateral pulmonary embolism ([place]) 08/2008   On coumadin  . Coronary artery disease    s/p DES to mid RCA in 02/2008; s/p inferior MI with DES to mid RCA and mid LAD in 05/2005; DES to mid RCA 07/2020  . Essential hypertension   . Hyperlipidemia    . Ischemic cardiomyopathy   . Memory disorder 07/08/2017  . Myocardial infarction ([place])   . Stroke ([place])   . Type 2 diabetes mellitus [facility])     Patient Active Problem List   Diagnosis Date Noted  . Acute on chronic heart failure ([place]) 01/30/2021  . Systolic and diastolic CHF, chronic ([place]) 11/28/2020  . Acute respiratory failure due to COVID-19 ([place]) 11/10/2020  . Hyperlipidemia   . Acute ST elevation myocardial infarction (STEMI) involving right coronary artery ([place])   . Acute heart failure ([place]) 07/14/2020  . Acute coronary syndrome ([place])   . Acute pulmonary edema ([facility])   . Respiratory failure ([place])   . Non-ST elevation (NSTEMI) myocardial infarction ([place])   . S/P left knee arthroscopy 09/25/17 10/02/2017  . Derangement of posterior horn of medial meniscus of left knee   . Derangement of posterior horn of lateral meniscus of left knee   . Primary osteoarthritis of left knee   . Memory disorder 07/08/2017  . Carotid artery stenosis 04/08/2017  . Left arm numbness 03/25/2017  . Chest pain 03/25/2017  . DM type 2 (diabetes mellitus, type 2) ([place]) 03/25/2017  . CKD (chronic kidney disease), stage III ([place]) 03/25/2017  . Long term (current) use of anticoagulants [Z79.01] 04/22/2016  . Facial droop   . Encounter for therapeutic drug monitoring 12/01/2013  . Atrial flutter ([place]) 06/09/2013  . Essential hypertension 06/09/2013  . CAD (coronary artery disease) 06/09/2013  . CLOSED  FRACTURE OF SURGICAL NECK OF HUMERUS 04/12/2010  . HIP, ARTHRITIS, DEGEN./OSTEO 03/01/2009  . HIP PAIN 03/01/2009    Past Surgical History:  Procedure Laterality Date  . ATRIAL FLUTTER ABLATION N/A 06/17/2013   Procedure: ATRIAL FLUTTER ABLATION;  Surgeon: Thompson Grayer, MD;  Location: [facility] CATH LAB;  Service: Cardiovascular;  Laterality: N/A;  . CAROTID PTA/STENT INTERVENTION N/A 04/10/2017   Procedure: Carotid PTA/Stent Intervention;  Surgeon: Algernon Huxley, MD;  Location: [facility] LAB;  Service:  Cardiovascular;  Laterality: N/A;  . CHOLECYSTECTOMY    . CORONARY STENT INTERVENTION N/A 07/14/2020   Procedure: CORONARY STENT INTERVENTION;  Surgeon: Sherren Mocha, MD;  Location: [facility] CV LAB;  Service: Cardiovascular;  Laterality: N/A;  . HIP SURGERY    . KNEE ARTHROSCOPY WITH MEDIAL MENISECTOMY Left 09/25/2017   Procedure: KNEE ARTHROSCOPY WITH MEDIAL AND LATERAL MENISECTOMY;  Surgeon: [name], MD;  Location: [facility] ORS;  Service: Orthopedics;  Laterality: Left;  . LEFT HEART CATH AND CORONARY ANGIOGRAPHY N/A 07/14/2020   Procedure: LEFT HEART CATH AND CORONARY ANGIOGRAPHY;  Surgeon: Sherren Mocha, MD;  Location: [facility] CV LAB;  Service: Cardiovascular;  Laterality: N/A;     OB History   No obstetric history on file.     Family History  Problem Relation Age of Onset  . Heart disease Other   . Arthritis Other   . Cancer Other   . Diabetes Other   . Heart failure Mother     Social History   Tobacco Use  . Smoking status: Former Smoker    Years: 10.00    Types: Cigarettes    Quit date: 10/14/1978    Years since quitting: 42.3  . Smokeless tobacco: Never Used  Vaping Use  . Vaping Use: Never used  Substance Use Topics  . Alcohol use: No  . Drug use: No    Home Medications Prior to Admission medications   Medication Sig Start Date End Date Taking? Authorizing Provider  albuterol (VENTOLIN HFA) 108 (90 Base) MCG/ACT inhaler Inhale 2 puffs into the lungs every 6 (six) hours as needed for wheezing or shortness of breath. 11/11/20  Yes Manuella Ghazi, Pratik D, DO  ALPRAZolam (XANAX) 0.25 MG tablet Take 0.125-0.25 mg by mouth See admin instructions. 0.125 in the morning. 0.25 mg at bedtime 06/29/20  Yes [provider]  Ascorbic Acid (VITAMIN C GUMMIES PO) Take 2 each by mouth daily. 180 mg   Yes [provider]  atorvastatin (LIPITOR) 20 MG tablet Take 20 mg by mouth every evening.   Yes [provider]  b complex vitamins capsule Take 1  capsule by mouth daily.   Yes [provider]  benzonatate (TESSALON) 100 MG capsule Take 100 mg by mouth 3 (three) times daily as needed for cough. 11/06/20  Yes [provider]  carvedilol (COREG) 6.25 MG tablet Take 1 tablet (6.25 mg total) by mouth 2 (two) times daily. 10/03/20  Yes Satira Sark, MD  cholecalciferol (VITAMIN D3) 25 MCG (1000 UNIT) tablet Take 1,000 Units by mouth daily.   Yes [provider]  clopidogrel (PLAVIX) 75 MG tablet Take 1 tablet by mouth once daily with breakfast 01/11/21  Yes Kathyrn Drown D, NP  Coenzyme Q10 (CO Q 10) 100 MG CAPS Take 100 mg by mouth daily.   Yes [provider]  furosemide (LASIX) 20 MG tablet Take 1 tablet (20 mg total) by mouth daily. 09/19/20 12/18/20 Yes Satira Sark, MD  glipiZIDE (GLUCOTROL XL)  5 MG 24 hr tablet Take 5 mg by mouth every evening. 09/12/20  Yes [provider]  guaiFENesin (MUCINEX) 600 MG 12 hr tablet Take 600 mg by mouth 2 (two) times daily as needed for cough (congestion).   Yes [provider]  guaiFENesin-dextromethorphan (ROBITUSSIN DM) 100-10 MG/5ML syrup Take 5 mLs by mouth every 4 (four) hours as needed for cough. 11/11/20  Yes Shah, Pratik D, DO  MEGARED OMEGA-3 KRILL OIL PO Take 1,000 mg by mouth daily. When she remembers   Yes [provider]  nitroGLYCERIN (NITROSTAT) 0.4 MG SL tablet Place 1 tablet (0.4 mg total) under the tongue every 5 (five) minutes x 3 doses as needed for chest pain. 08/03/20  Yes Verta Ellen., NP  Polyethyl Glycol-Propyl Glycol (SYSTANE OP) Apply 1 drop to eye 3 (three) times daily as needed (dry eyes).   Yes [provider]  vitamin E 180 MG (400 UNITS) capsule Take 400 Units by mouth daily.   Yes [provider]  warfarin (COUMADIN) 5 MG tablet Take 5 mg by mouth every evening. 05/31/18  Yes [provider]    Allergies    Patient has no known allergies.  Review of Systems   Review of  Systems All 5 caveat cannot obtain review of systems due to patient condition Physical Exam Updated Vital Signs BP (!) 174/89   Pulse 82   Temp (!) 97.5 F (36.4 C)   Resp (!) 22   Ht '5\' 5"'$  (1.651 m)   SpO2 98%   BMI 29.79 kg/m   Physical Exam Constitutional:      Comments: Patient is in respiratory distress on arrival.  She is obtunded.  She is breathing spontaneously with deep slow respirations.  She is diaphoretic and pale.  HENT:     Mouth/Throat:     Comments: Is patent with some frothy secretions. Cardiovascular:     Comments: Distant heart sounds, regular.  Monitor shows sinus rhythm in the 90s narrow complex Pulmonary:     Comments: Crackles throughout all lung fields and appreciable anteriorly. Abdominal:     Comments: Abdomen is mildly distended and soft.  Musculoskeletal:     Comments: DoubtNo obvious, significant edema of the lower extremities.  Lower extremities are symmetric appearance of wounds or significant cellulitis.   Skin:    Comments: Pale diaphoretic  Neurological:     Comments: Patient is attended.  She is not giving any verbal responses.  She is breathing spontaneously.     ED Results / Procedures / Treatments   Labs (all labs ordered are listed, but only abnormal results are displayed) Labs Reviewed  COMPREHENSIVE METABOLIC PANEL - Abnormal; Notable for the following components:      Result Value   Sodium 134 (*)    Chloride 96 (*)    Glucose, Bld 302 (*)    BUN 32 (*)    Creatinine, Ser 1.65 (*)    GFR, Estimated 31 (*)    All other components within normal limits  BRAIN NATRIURETIC PEPTIDE - Abnormal; Notable for the following components:   B Natriuretic Peptide 587.4 (*)    All other components within normal limits  LACTIC ACID, PLASMA - Abnormal; Notable for the following components:   Lactic Acid, Venous 2.3 (*)    All other components within normal limits  CBC WITH DIFFERENTIAL/PLATELET - Abnormal; Notable for the following  components:   Lymphs Abs 4.1 (*)    All other components within normal limits  PROTIME-INR - Abnormal; Notable for the following components:   Prothrombin Time 19.8 (*)    INR 1.7 (*)    All other components within normal limits  URINALYSIS, ROUTINE W REFLEX MICROSCOPIC - Abnormal; Notable for the following components:   Glucose, UA >=500 (*)    Protein, ur 100 (*)    All other components within normal limits  CREATININE, SERUM - Abnormal; Notable for the following components:   Creatinine, Ser 1.53 (*)    GFR, Estimated 34 (*)    All other components within normal limits  CBG MONITORING, ED - Abnormal; Notable for the following components:   Glucose-Capillary 280 (*)    All other components within normal limits  I-STAT CHEM 8, ED - Abnormal; Notable for the following components:   BUN 41 (*)    Creatinine, Ser 1.50 (*)    Glucose, Bld 314 (*)    All other components within normal limits  I-STAT ARTERIAL BLOOD GAS, ED - Abnormal; Notable for the following components:   pH, Arterial 7.277 (*)    pCO2 arterial 64.5 (*)    pO2, Arterial 153 (*)    Bicarbonate 30.4 (*)    Sodium 133 (*)    All other components within normal limits  TROPONIN I (HIGH SENSITIVITY) - Abnormal; Notable for the following components:   Troponin I (High Sensitivity) 23 (*)    All other components within normal limits  RESP PANEL BY RT-PCR (FLU A&B, COVID) ARPGX2  CULTURE, BLOOD (ROUTINE X 2)  CULTURE, BLOOD (ROUTINE X 2)  CULTURE, RESPIRATORY W GRAM STAIN  CBC  LACTIC ACID, PLASMA  BLOOD GAS, ARTERIAL  CBC  BASIC METABOLIC PANEL  MAGNESIUM  PHOSPHORUS  HEPARIN LEVEL (UNFRACTIONATED)  HEMOGLOBIN A1C  TROPONIN I (HIGH SENSITIVITY)    EKG EKG Interpretation  Date/Time:  Tuesday January 30 2021 19:59:53 EDT Ventricular Rate:  90 PR Interval:  176 QRS Duration: 122 QT Interval:  403 QTC Calculation: 494 R Axis:   -37 Text Interpretation: Sinus rhythm Left bundle branch block no STEMI Confirmed  by Charlesetta Shanks [phone]) on 01/30/2021 10:35:51 PM   Radiology DG Chest Portable 1 View  Result Date: 01/30/2021 CLINICAL DATA:  Intubation. EXAM: PORTABLE CHEST 1 VIEW COMPARISON:  Chest x-ray 11/10/2020 FINDINGS: The endotracheal tube is 5 cm above the carina. The NG tube is coursing down the esophagus and into the stomach. External pacer paddles are noted. Bilateral airspace opacities most significant in the right upper lobe suggesting multifocal pneumonia. No pleural effusions or pneumothorax. IMPRESSION: 1. Endotracheal tube and NG tubes in good position. 2. Bilateral airspace opacities suggesting multifocal pneumonia. Electronically Signed   By: Marijo Sanes M.D.   On: 01/30/2021 20:23    Procedures Procedure Name: Intubation Date/Time: 01/30/2021 10:38 PM Performed by: Charlesetta Shanks, MD Pre-anesthesia Checklist: Patient being monitored, Emergency Drugs available and Suction available Oxygen Delivery Method: Ambu bag Preoxygenation: Pre-oxygenation with 100% oxygen Induction Type: Rapid sequence and Cricoid Pressure applied Ventilation: Mask ventilation without difficulty Laryngoscope Size: Glidescope and 3 Grade View: Grade II Tube size: 7.5 mm Number of attempts: 1 Placement Confirmation: ETT inserted through vocal cords under direct vision,  Positive ETCO2,  CO2 detector and Breath sounds checked- equal and bilateral Secured at: 22 cm Tube secured with: ETT holder Dental Injury: Injury to lip  Comments: Patient intubated on first attempt.  Patient maintained 99 to 100% oxygenation throughout intubation.  There was white froth in the airway.       CRITICAL CARE Performed  by: Charlesetta Shanks   Total critical care time: 60  minutes  Critical care time was exclusive of separately billable procedures and treating other patients.  Critical care was necessary to treat or prevent imminent or life-threatening deterioration.  Critical care was time spent personally by me on  the following activities: development of treatment plan with patient and/or surrogate as well as nursing, discussions with consultants, evaluation of patient's response to treatment, examination of patient, obtaining history from patient or surrogate, ordering and performing treatments and interventions, ordering and review of laboratory studies, ordering and review of radiographic studies, pulse oximetry and re-evaluation of patient's condition.   Medications Ordered in ED Medications  furosemide (LASIX) injection 60 mg (0 mg Intravenous Hold 01/30/21 2243)  docusate sodium (COLACE) capsule 100 mg (has no administration in time range)  polyethylene glycol (MIRALAX / GLYCOLAX) packet 17 g (has no administration in time range)  pantoprazole (PROTONIX) injection 40 mg (40 mg Intravenous Given 01/30/21 2243)  acetaminophen (TYLENOL) tablet 650 mg (has no administration in time range)  ondansetron (ZOFRAN) injection 4 mg (has no administration in time range)  heparin bolus via infusion 3,500 Units (has no administration in time range)  heparin ADULT infusion 100 units/mL (25000 units/236m) (has no administration in time range)  norepinephrine (LEVOPHED) 4-5 MG/250ML-% infusion SOLN (has no administration in time range)  insulin aspart (novoLOG) injection 0-9 Units (has no administration in time range)  rocuronium (ZEMURON) injection (80 mg Intravenous Given 01/30/21 1950)  etomidate (AMIDATE) injection (20 mg Intravenous Given 01/30/21 1951)  propofol (DIPRIVAN) 1000 MG/100ML infusion (0 mcg/kg/min  81.2 kg (Order-Specific) Intravenous Paused 01/30/21 2236)    ED Course  I have reviewed the triage vital signs and the nursing notes.  Pertinent labs & imaging results that were available during my care of the patient were reviewed by me and considered in my medical decision making (see chart for details).  Clinical Course as of 01/30/21 2245  Tue Jan 30, 2021  2102 Consult: Reviewed with intensivist  for admission [MP]    Clinical Course User Index [MP] PCharlesetta Shanks MD   MDM Rules/Calculators/A&P                          Patient presents in extremis.  Findings most suggestive of congestive heart failure.  Patient has history of congestive heart failure.  She has crackles throughout the lung fields.  When intubated there was frothy secretion emanating from the airway.  Chest x-ray shows diffuse fluffy infiltrates.  Patient does not have leukocytosis.  Patient is hypertensive upon arrival.  Lower suspicion for sepsis.  Patient admitted to intensivist service. Final Clinical Impression(s) / ED Diagnoses Final diagnoses:  Hypertensive emergency  Acute on chronic congestive heart failure, unspecified heart failure type ([facility]    Rx / DC Orders ED Discharge Orders    None       PCharlesetta Shanks MD 01/30/21 2245

## 2021-01-30 NOTE — ED Notes (Signed)
Verbal order soft restraints from, Pfeiffer, MD this RN remains at bedside with pt, currently  holding pt upper extremities, pt restless at this time

## 2021-01-30 NOTE — ED Notes (Signed)
secretary called to page critical care at this time

## 2021-01-30 NOTE — ED Triage Notes (Signed)
EMS ARRIVAL CODE STEMI, sob at home per EMS pt SPO2 78% upon arrival, post CPAP placement SPO2 96% before decline. Per EMS pt became unresponsive, EMS applied nonrebreather at 15L upon arrival GCS 3.

## 2021-01-30 NOTE — ED Notes (Signed)
Hospitalist paged BP 47/39 (44) 58,  Peripheral Pulses bounding, propfolol paused at this time.

## 2021-01-30 NOTE — ED Notes (Signed)
Pfeiffer, MD at bedside at this time performing assessment, Levophed started for BP at 7.61m at this time

## 2021-01-30 NOTE — ED Notes (Signed)
Spoke with Ruthann Cancer, MD awaiting orders at this time

## 2021-01-30 NOTE — ED Notes (Signed)
This RN called Network engineer to page critical care at this time awaiting call back

## 2021-01-31 ENCOUNTER — Other Ambulatory Visit: Payer: Self-pay

## 2021-01-31 ENCOUNTER — Inpatient Hospital Stay (HOSPITAL_COMMUNITY): Payer: Medicare Other

## 2021-01-31 ENCOUNTER — Encounter (HOSPITAL_COMMUNITY): Payer: Self-pay | Admitting: Critical Care Medicine

## 2021-01-31 ENCOUNTER — Telehealth: Payer: Self-pay | Admitting: *Deleted

## 2021-01-31 DIAGNOSIS — I5033 Acute on chronic diastolic (congestive) heart failure: Secondary | ICD-10-CM

## 2021-01-31 LAB — BASIC METABOLIC PANEL
Anion gap: 8 (ref 5–15)
BUN: 34 mg/dL — ABNORMAL HIGH (ref 8–23)
CO2: 27 mmol/L (ref 22–32)
Calcium: 8.4 mg/dL — ABNORMAL LOW (ref 8.9–10.3)
Chloride: 99 mmol/L (ref 98–111)
Creatinine, Ser: 1.6 mg/dL — ABNORMAL HIGH (ref 0.44–1.00)
GFR, Estimated: 32 mL/min — ABNORMAL LOW (ref >60)
Glucose, Bld: 287 mg/dL — ABNORMAL HIGH (ref 70–99)
Potassium: 5.4 mmol/L — ABNORMAL HIGH (ref 3.5–5.1)
Sodium: 134 mmol/L — ABNORMAL LOW (ref 135–145)

## 2021-01-31 LAB — ECHOCARDIOGRAM COMPLETE
AR max vel: 1.27 cm2
AV Area VTI: 1.1 cm2
AV Mean grad: 11 mmHg
AV Peak grad: 18.7 mmHg
Ao pk vel: 2.16 m/s
Height: 65 in
S' Lateral: 4.5 cm
Weight: 3139.35 oz

## 2021-01-31 LAB — HEPARIN LEVEL (UNFRACTIONATED)
Heparin Unfractionated: 0.16 IU/mL — ABNORMAL LOW (ref 0.30–0.70)
Heparin Unfractionated: 0.23 IU/mL — ABNORMAL LOW (ref 0.30–0.70)

## 2021-01-31 LAB — CBC
HCT: 36.7 % (ref 36.0–46.0)
Hemoglobin: 11.6 g/dL — ABNORMAL LOW (ref 12.0–15.0)
MCH: 28.7 pg (ref 26.0–34.0)
MCHC: 31.6 g/dL (ref 30.0–36.0)
MCV: 90.8 fL (ref 80.0–100.0)
Platelets: 301 10*3/uL (ref 150–400)
RBC: 4.04 MIL/uL (ref 3.87–5.11)
RDW: 14.2 % (ref 11.5–15.5)
WBC: 7.5 10*3/uL (ref 4.0–10.5)
nRBC: 0 % (ref 0.0–0.2)

## 2021-01-31 LAB — GLUCOSE, CAPILLARY
Glucose-Capillary: 300 mg/dL — ABNORMAL HIGH (ref 70–99)
Glucose-Capillary: 255 mg/dL — ABNORMAL HIGH (ref 70–99)
Glucose-Capillary: 169 mg/dL — ABNORMAL HIGH (ref 70–99)
Glucose-Capillary: 186 mg/dL — ABNORMAL HIGH (ref 70–99)
Glucose-Capillary: 194 mg/dL — ABNORMAL HIGH (ref 70–99)
Glucose-Capillary: 155 mg/dL — ABNORMAL HIGH (ref 70–99)

## 2021-01-31 LAB — PHOSPHORUS: Phosphorus: 4.4 mg/dL (ref 2.5–4.6)

## 2021-01-31 LAB — HEMOGLOBIN A1C
Hgb A1c MFr Bld: 8.1 % — ABNORMAL HIGH (ref 4.8–5.6)
Mean Plasma Glucose: 185.77 mg/dL

## 2021-01-31 LAB — MRSA PCR SCREENING: MRSA by PCR: NEGATIVE

## 2021-01-31 LAB — MAGNESIUM: Magnesium: 2.2 mg/dL (ref 1.7–2.4)

## 2021-01-31 MED ORDER — ORAL CARE MOUTH RINSE
15.0000 mL | Freq: Two times a day (BID) | OROMUCOSAL | Status: DC
  Administered 2021-01-31 – 2021-02-06 (×8): 15 mL via OROMUCOSAL

## 2021-01-31 MED ORDER — POLYETHYLENE GLYCOL 3350 17 G PO PACK
17.0000 g | PACK | Freq: Every day | ORAL | Status: DC
  Administered 2021-02-03 – 2021-02-06 (×4): 17 g via ORAL
  Filled 2021-01-31 (×6): qty 1

## 2021-01-31 MED ORDER — NOREPINEPHRINE 4 MG/250ML-% IV SOLN: 2.0000 ug/min | INTRAVENOUS | Status: DC

## 2021-01-31 MED ORDER — CLOPIDOGREL BISULFATE 75 MG PO TABS
75.0000 mg | ORAL_TABLET | Freq: Every day | ORAL | Status: DC
  Administered 2021-01-31: 75 mg
  Filled 2021-01-31: qty 1

## 2021-01-31 MED ORDER — INSULIN GLARGINE 100 UNIT/ML ~~LOC~~ SOLN
10.0000 [IU] | Freq: Every day | SUBCUTANEOUS | Status: DC
  Filled 2021-01-31 (×2): qty 0.1

## 2021-01-31 MED ORDER — CHLORHEXIDINE GLUCONATE 0.12 % MT SOLN
15.0000 mL | Freq: Two times a day (BID) | OROMUCOSAL | Status: DC
  Administered 2021-01-31 – 2021-02-06 (×10): 15 mL via OROMUCOSAL
  Filled 2021-01-31 (×9): qty 15

## 2021-01-31 MED ORDER — FUROSEMIDE 10 MG/ML IJ SOLN
80.0000 mg | Freq: Four times a day (QID) | INTRAMUSCULAR | Status: AC
  Administered 2021-01-31 (×2): 80 mg via INTRAVENOUS
  Filled 2021-01-31 (×2): qty 8

## 2021-01-31 MED ORDER — ATORVASTATIN CALCIUM 10 MG PO TABS
20.0000 mg | ORAL_TABLET | Freq: Every day | ORAL | Status: DC
  Administered 2021-01-31: 20 mg
  Filled 2021-01-31: qty 2

## 2021-01-31 MED ORDER — CLOPIDOGREL BISULFATE 75 MG PO TABS
75.0000 mg | ORAL_TABLET | Freq: Every day | ORAL | Status: DC
  Administered 2021-02-01 – 2021-02-06 (×6): 75 mg via ORAL
  Filled 2021-01-31 (×7): qty 1

## 2021-01-31 MED ORDER — SODIUM CHLORIDE 0.9 % IV SOLN: 250.0000 mL | INTRAVENOUS | Status: DC

## 2021-01-31 MED ORDER — CHLORHEXIDINE GLUCONATE CLOTH 2 % EX PADS
6.0000 | MEDICATED_PAD | Freq: Every day | CUTANEOUS | Status: DC
  Administered 2021-02-01 – 2021-02-06 (×4): 6 via TOPICAL

## 2021-01-31 MED ORDER — ORAL CARE MOUTH RINSE
15.0000 mL | OROMUCOSAL | Status: DC
  Administered 2021-01-31 (×2): 15 mL via OROMUCOSAL

## 2021-01-31 MED ORDER — CHLORHEXIDINE GLUCONATE 0.12% ORAL RINSE (MEDLINE KIT)
15.0000 mL | Freq: Two times a day (BID) | OROMUCOSAL | Status: DC
  Administered 2021-01-31 (×2): 15 mL via OROMUCOSAL

## 2021-01-31 MED ORDER — PERFLUTREN LIPID MICROSPHERE
1.0000 mL | INTRAVENOUS | Status: AC | PRN
  Administered 2021-01-31: 2 mL via INTRAVENOUS
  Filled 2021-01-31: qty 10

## 2021-01-31 MED ORDER — ATORVASTATIN CALCIUM 10 MG PO TABS
20.0000 mg | ORAL_TABLET | Freq: Every day | ORAL | Status: DC
  Administered 2021-02-01 – 2021-02-02 (×2): 20 mg via ORAL
  Filled 2021-01-31 (×2): qty 2

## 2021-01-31 MED ORDER — DOCUSATE SODIUM 100 MG PO CAPS
100.0000 mg | ORAL_CAPSULE | Freq: Two times a day (BID) | ORAL | Status: DC
  Administered 2021-01-31 – 2021-02-06 (×12): 100 mg via ORAL
  Filled 2021-01-31 (×12): qty 1

## 2021-01-31 NOTE — Progress Notes (Signed)
  Echocardiogram 2D Echocardiogram with definity and 3D has been performed.  [name] M 01/31/2021, 10:06 AM

## 2021-01-31 NOTE — Procedures (Signed)
Extubation Procedure Note  Patient Details:   Name: [name] DOB: May 01, 1941 MRN: [id number]   Airway Documentation:    Vent end date: 01/31/21 Vent end time: 0845   Evaluation  O2 sats: stable throughout Complications: No apparent complications Patient did tolerate procedure well. Bilateral Breath Sounds: Other (Comment) (coarse)   Yes   Patient extubated to bipap per MD order. Positive cuff leak. No stridor noted. Patient placed on bipap right after extubation. Patient tolerating bipap well. RT will monitor. RN at bedside.  Herbie Saxon [place] 01/31/2021, 8:45 AM

## 2021-01-31 NOTE — H&P (Signed)
NAME:  [name], MRN:  [id number], DOB:  [birth year], LOS: 1 ADMISSION DATE:  01/30/2021, CONSULTATION DATE:  01/30/21 REFERRING MD:  Dr Colvin Caroli, CHIEF COMPLAINT:  Sob and resp failure  History of Present Illness:  80 yo female with pmh afib on chronic coumadin, h/o pe's, h/o cva, hyperlipidemia, HTN, cad s/p DES to RCA 2006/2009/2021, HFpEF, dm2 who presented with progressive sob and hypoxic resp failure.   All history is obtained from chart review, son and ed physician as pt is intubated and sedated at this time.   Per son pt has complained of sob for past few days that has progressively worsened. No f/c, cough but not different from baseline. Pt has known cardiac history and today sob progressed to point she called EMS. When they arrived pt's sats were 78% and was placed on NIV with improvement in saturations to 90's but subsequently pt became unresponsive. Ems transitioned her to NRB with stable saturations but due to GCS 3 on arrival pt was intubated for airway protection.   CXR revealed pulmonary edema. She has thin frothy sputum in copious quantities coming from ett. She is unresponsive but s/p paralytic for intubation.    Pertinent  Medical History  HFpEF CAD s/p mutiple PCI with DES to RCA  afib B/l pe [id number]  Significant Hospital Events: Including procedures, antibiotic start and stop dates in addition to other pertinent events   . 4/19 admitted  Interim History / Subjective:  Awake, alert, interactive on vent. Some UoP with lasix.  Objective   Blood pressure (!) 107/52, pulse (!) 55, temperature 98.6 F (37 C), resp. rate (!) 22, height '5\' 5"'$  (1.651 m), weight 89 kg, SpO2 100 %.    Vent Mode: PRVC FiO2 (%):  [80 %-100 %] 80 % Set Rate:  [18 bmp-22 bmp] 22 bmp Vt Set:  [460 mL] 460 mL PEEP:  [5 cmH20] 5 cmH20 Plateau Pressure:  [28 cmH20-29 cmH20] 28 cmH20   Intake/Output Summary (Last 24 hours) at 01/31/2021 0754 Last data filed at 01/31/2021 0600 Gross per  24 hour  Intake 153.26 ml  Output 805 ml  Net -651.74 ml   Filed Weights   01/31/21 0000 01/31/21 0500  Weight: 89 kg 89 kg    Examination: Constitutional: no acute distress on vent  Eyes: EOMI, pupils equal Ears, nose, mouth, and throat: ETT in place, minimal secretions Cardiovascular: bradycardic, ext warm Respiratory: scattered crackles, triggering vent Gastrointestinal: soft Skin: No rashes, normal turgor Neurologic: moves all 4 ext to command Psychiatric: RASS 0    Labs/imaging that I havepersonally reviewed  (right click and "Reselect all SmartList Selections" daily)  K up slightly BUN/Cr stable Mg/Phos okay CXR looks mostly like edema  Resolved Hospital Problem list     Assessment & Plan:  Acute hypercarbic and hypoxemic respiratory failure due to volume overloaded state of heart. Hypercarbic encephalopathy resolved CAD with DES in place HFPEF CKD 3b Shock mostly related to sedation Afib and hx of PE on warfarin PTA DM2 with hyperglycemia  - Wean FiO2 - Push lasix - Consider extubation to BIPAP later today if does well with SAT/SBT - Heparin in lieu of warfarin for now - Continue DAPT - A1c is 8.1% on home glipzide, start lantus while here with SSI  Best practice (right click and "Reselect all SmartList Selections" daily)  Diet:  NPO Pain/Anxiety/Delirium protocol (if indicated): Yes (RASS goal -1) VAP protocol (if indicated): Yes DVT prophylaxis: SCD and Systemic AC GI prophylaxis: PPI Glucose control:  SSI Yes Central venous access:  N/A Arterial line:  N/A Foley:  Yes, and it is still needed Mobility:  bed rest  PT consulted: N/A Last date of multidisciplinary goals of care discussion [pending] Code Status:  limited Disposition: ICU   Patient critically ill due to respiratory failure Interventions to address this today vent weaning, diuresis Risk of deterioration without these interventions is high  I personally spent 38 minutes  providing critical care not including any separately billable procedures  Erskine Emery MD [facility]  Prefer epic messenger for cross cover needs If after hours, please call E-link

## 2021-01-31 NOTE — Progress Notes (Signed)
Transported pt from ED to [facility] RM 14. No complications noted

## 2021-01-31 NOTE — Evaluation (Signed)
Clinical/Bedside Swallow Evaluation Patient Details  Name: [name] MRN: [id number] Date of Birth: 13-Jan-1941  Today's Date: 01/31/2021 Time: SLP Start Time (ACUTE ONLY): 1317 SLP Stop Time (ACUTE ONLY): 1338 SLP Time Calculation (min) (ACUTE ONLY): 21 min  Past Medical History:  Past Medical History:  Diagnosis Date  . Arthritis   . Atrial flutter ([place])   . Bilateral pulmonary embolism ([place]) 08/2008   On coumadin  . Coronary artery disease    s/p DES to mid RCA in 02/2008; s/p inferior MI with DES to mid RCA and mid LAD in 05/2005; DES to mid RCA 07/2020  . Essential hypertension   . Hyperlipidemia   . Ischemic cardiomyopathy   . Memory disorder 07/08/2017  . Myocardial infarction ([place])   . Stroke ([place])   . Type 2 diabetes mellitus ([place])    Past Surgical History:  Past Surgical History:  Procedure Laterality Date  . ATRIAL FLUTTER ABLATION N/A 06/17/2013   Procedure: ATRIAL FLUTTER ABLATION;  Surgeon: Thompson Grayer, MD;  Location: [facility] CATH LAB;  Service: Cardiovascular;  Laterality: N/A;  . CAROTID PTA/STENT INTERVENTION N/A 04/10/2017   Procedure: Carotid PTA/Stent Intervention;  Surgeon: Algernon Huxley, MD;  Location: [place] CV LAB;  Service: Cardiovascular;  Laterality: N/A;  . CHOLECYSTECTOMY    . CORONARY STENT INTERVENTION N/A 07/14/2020   Procedure: CORONARY STENT INTERVENTION;  Surgeon: Sherren Mocha, MD;  Location: [place] CV LAB;  Service: Cardiovascular;  Laterality: N/A;  . HIP SURGERY    . KNEE ARTHROSCOPY WITH MEDIAL MENISECTOMY Left 09/25/2017   Procedure: KNEE ARTHROSCOPY WITH MEDIAL AND LATERAL MENISECTOMY;  Surgeon: [name], MD;  Location: [facility] ORS;  Service: Orthopedics;  Laterality: Left;  . LEFT HEART CATH AND CORONARY ANGIOGRAPHY N/A 07/14/2020   Procedure: LEFT HEART CATH AND CORONARY ANGIOGRAPHY;  Surgeon: Sherren Mocha, MD;  Location: [place] CV LAB;  Service: Cardiovascular;  Laterality: N/A;   HPI:  Pt is an 80 yo female  with acute hypercarbic and hypoxemic respiratory failure secondary to volume overload of her heart. Intubated from 4/19 to 4/20. CXR 01/31/21: "Multifocal bilateral pulmonary infiltrates/edema again noted without interim change. Atelectatic changes right upper lobe. Small bilateral pleural effusions." PMH significant for CVA, DM, myocardial infarction, HTN, bilateral pulmonary embolism.   Assessment / Plan / Recommendation Clinical Impression  A single immediate cough was observed following trials of 3 ounces of water via straw x2. Coughing appeared to be alleviated with other larger volumes and consecutive sips from the cup with the exception of one delayed cough. Of note, pt was observed to cough prior to PO trials which makes clinical assessment more difficult. She reports that she has been coughing up mucous and requires the use of her [place]. No other overt s/s of dysphagia were noted with any consistencies. Pt reports that her voice sounds close to normal and that she consumed a regular diet PTA. Based on these findings and brief intubation, recommend a regular diet with thin liquids via cup (no straw) and SLP will continue to follow.   SLP Visit Diagnosis: Dysphagia, unspecified (R13.10)    Aspiration Risk  Mild aspiration risk    Diet Recommendation Thin liquid;Regular   Liquid Administration via: Cup;No straw Medication Administration: Whole meds with puree Supervision: Patient able to self feed;Intermittent supervision to cue for compensatory strategies Compensations: Slow rate;Small sips/bites Postural Changes: Seated upright at 90 degrees;Remain upright for at least 30 minutes after po intake    Other  Recommendations Oral Care  Recommendations: Oral care BID   Follow up Recommendations  (tba)      Frequency and Duration min 1 x/week  1 week       Prognosis Prognosis for Safe Diet Advancement: Good      Swallow Study   General HPI: Pt is an 80 yo female with acute  hypercarbic and hypoxemic respiratory failure secondary to volume overload of her heart. Intubated from 4/19 to 4/20. CXR 01/31/21: "Multifocal bilateral pulmonary infiltrates/edema again noted without interim change. Atelectatic changes right upper lobe. Small bilateral pleural effusions." PMH significant for CVA, DM, myocardial infarction, HTN, bilateral pulmonary embolism. Type of Study: Bedside Swallow Evaluation Previous Swallow Assessment: None in chart Diet Prior to this Study: NPO Temperature Spikes Noted: No Respiratory Status: Nasal cannula History of Recent Intubation: Yes Length of Intubations (days): 1 days Date extubated: 01/31/21 Behavior/Cognition: Alert;Cooperative;Pleasant mood Oral Cavity Assessment: Within Functional Limits Oral Care Completed by SLP: No Oral Cavity - Dentition: Adequate natural dentition Vision: Functional for self-feeding Self-Feeding Abilities: Able to feed self Patient Positioning: Upright in bed Baseline Vocal Quality: Normal Volitional Cough: Strong Volitional Swallow: Able to elicit    Oral/Motor/Sensory Function Overall Oral Motor/Sensory Function: Within functional limits   Ice Chips Ice chips: Within functional limits Presentation: Spoon;Self Fed   Thin Liquid Thin Liquid: Impaired Presentation: Cup;Self Fed;Spoon;Straw Pharyngeal  Phase Impairments: Cough - Immediate;Cough - Delayed    Nectar Thick Nectar Thick Liquid: Not tested   Honey Thick Honey Thick Liquid: Not tested   Puree Puree: Within functional limits Presentation: Self Fed;Spoon   Solid     Solid: Within functional limits Presentation: [place]., SLP Student 01/31/2021,2:17 PM

## 2021-01-31 NOTE — Progress Notes (Signed)
[place] for heparin Indication: Hx PE, aflutter  No Known Allergies  Patient Measurements: Height: '5\' 5"'$  (165.1 cm) Weight: 89 kg (196 lb 3.4 oz) IBW/kg (Calculated) : 57 Heparin Dosing Weight: 77kg  Vital Signs: Temp: 99.14 F (37.3 C) (04/20 1030) Temp Source: Bladder (04/20 0800) BP: 121/48 (04/20 1000) Pulse Rate: 85 (04/20 1030)  Labs: Recent Labs    01/30/21 2010 01/30/21 2044 01/30/21 2131 01/30/21 2202 01/31/21 0222 01/31/21 0932  HGB 13.0 12.2 12.1  --  11.6*  --   HCT 41.5 36.0 38.8  --  36.7  --   PLT 365  --  318  --  301  --   LABPROT 19.8*  --   --   --   --   --   INR 1.7*  --   --   --   --   --   HEPARINUNFRC  --   --   --   --   --  0.16*  CREATININE 1.65*  --  1.53*  --  1.60*  --   TROPONINIHS 23*  --   --  64*  --   --     Estimated Creatinine Clearance: 30.9 mL/min (A) (by C-G formula based on SCr of 1.6 mg/dL (H)).   Medical History: Past Medical History:  Diagnosis Date  . Arthritis   . Atrial flutter ([place])   . Bilateral pulmonary embolism ([place]) 08/2008   On coumadin  . Coronary artery disease    s/p DES to mid RCA in 02/2008; s/p inferior MI with DES to mid RCA and mid LAD in 05/2005; DES to mid RCA 07/2020  . Essential hypertension   . Hyperlipidemia   . Ischemic cardiomyopathy   . Memory disorder 07/08/2017  . Myocardial infarction ([place])   . Stroke ([place])   . Type 2 diabetes mellitus ([facility])    Assessment: 59 YOF presenting with SOB, hx of PE and aflutter on warfarin PTA.  INR subtherapeutic on admission at 1.7 so will proceed with heparin gtt while holding warfarin for possible procedures.    Initial heparin level subtherapeutic at 0.16 on drip rate 1000 units/hr. Patient just extubated. Hgb stable 11.6, pltc 301. No overt bleeding or infusion issues per discussion with nursing.   Goal of Therapy:  Heparin level 0.3-0.7 units/ml Monitor platelets by anticoagulation protocol: Yes   Plan:   Increase heparin infusion to 1200 units/hr F/u 8hr HL Monitor daily HL, CBC, s/sx bleeding  Richardine Service, PharmD, BCPS PGY2 Cardiology Pharmacy Resident Phone: [phone] 01/31/2021  11:00 AM  Please check [email or web address] for unit-specific pharmacy phone numbers.

## 2021-01-31 NOTE — Telephone Encounter (Signed)
Pam -daughter called stating that her mother is in ICU at [facility]. Concerned about her coumadin check. [phone].

## 2021-01-31 NOTE — Progress Notes (Signed)
ANTICOAGULATION CONSULT NOTE - Follow Up Consult  Pharmacy Consult for IV Heparin Indication: Hx PE, aflutter  No Known Allergies  Patient Measurements: Height: '5\' 5"'$  (165.1 cm) Weight: 89 kg (196 lb 3.4 oz) IBW/kg (Calculated) : 57 Heparin Dosing Weight: 77 kg  Vital Signs: Temp: 99.1 F (37.3 C) (04/20 1935) Temp Source: Oral (04/20 1935) BP: 131/50 (04/20 1813) Pulse Rate: 79 (04/20 1813)  Labs: Recent Labs    01/30/21 2010 01/30/21 2044 01/30/21 2131 01/30/21 2202 01/31/21 0222 01/31/21 0932 01/31/21 1852  HGB 13.0 12.2 12.1  --  11.6*  --   --   HCT 41.5 36.0 38.8  --  36.7  --   --   PLT 365  --  318  --  301  --   --   LABPROT 19.8*  --   --   --   --   --   --   INR 1.7*  --   --   --   --   --   --   HEPARINUNFRC  --   --   --   --   --  0.16* 0.23*  CREATININE 1.65*  --  1.53*  --  1.60*  --   --   TROPONINIHS 23*  --   --  64*  --   --   --     Estimated Creatinine Clearance: 30.9 mL/min (A) (by C-G formula based on SCr of 1.6 mg/dL (H)).   Medical History: Past Medical History:  Diagnosis Date  . Arthritis   . Atrial flutter ([place])   . Bilateral pulmonary embolism ([place]) 08/2008   On coumadin  . Coronary artery disease    s/p DES to mid RCA in 02/2008; s/p inferior MI with DES to mid RCA and mid LAD in 05/2005; DES to mid RCA 07/2020  . Essential hypertension   . Hyperlipidemia   . Ischemic cardiomyopathy   . Memory disorder 07/08/2017  . Myocardial infarction ([place])   . Stroke ([place])   . Type 2 diabetes mellitus [facility])    Assessment: 80 yr old female presented with SOB; pt has hx of PE and aflutter, for which she was on warfarin PTA.  INR was subtherapeutic on admission at 1.7; pharmacy was consulted for heparin while holding warfarin for possible procedures.    Heparin level ~8 hrs after heparin infusion was increased to 1200 units/hr was 0.23 units/ml, which is below the goal range for this pt. H/H 11.6/36.7, plt 301. Per RN, no issues with IV in  which heparin is infusing; however, another IV is leaking and has some bleeding around IV site, but no other bleeding observed (RN plans to remove that IV).  Goal of Therapy:  Heparin level 0.3-0.7 units/ml Monitor platelets by anticoagulation protocol: Yes   Plan:  Increase heparin infusion to 1350 units/hr Check heparin level in 8 hrs Monitor daily heparin level, CBC Monitor for bleeding  Gillermina Hu, PharmD, BCPS, [facility] Clinical Pharmacist 01/31/2021  8:16 PM

## 2021-01-31 NOTE — Telephone Encounter (Signed)
Pt was admitted to ICU last night for pulmonary Edema/CHF.  Was intubated but extubated this am and on BiPap.  Pt has INR appt in office on 4/25.  Daughter wants to know if she should cancel.  Will leave appt on schedule for now and cancel closer to time if she remains in the hospital or is d/c with Home Health.  Daughter in agreement.

## 2021-01-31 NOTE — Progress Notes (Signed)
eLink Physician-Brief Progress Note Patient Name: [name] DOB: 1940-11-21 MRN: [id number]   Date of Service  01/31/2021  HPI/Events of Note  24 F history of CAD s/p multiple stents, combined HF EF 45-50%, mild to moderate AS, afib on warfarin, DM (HbA1C 7.3), dyslipidemia, hypertension brought in after EMS was called due to progressive worsening of shortness of breath. She was hypoxic and hypertensive on arrival improved with 100% NRBM but became obtunded and was eventually intubated.  Seen awake on Fentanyl VAC 22/480/80%/5 PEEP, peak pressure 35, MV 10 BP 127/56  HR 59  O2 100% on norepinephrine  eICU Interventions  Acute hypercapneic and hypoxic respiratory failure. Peak pressure improves with suctioning of frothy secretions likely pulmonary edema. Given Lasix 60 mg with 500 cc output since admission.        Judd Lien 01/31/2021, 12:41 AM

## 2021-02-01 ENCOUNTER — Inpatient Hospital Stay (HOSPITAL_COMMUNITY): Payer: Medicare Other

## 2021-02-01 DIAGNOSIS — I509 Heart failure, unspecified: Secondary | ICD-10-CM | POA: Diagnosis not present

## 2021-02-01 LAB — BLOOD CULTURE ID PANEL (REFLEXED) - BCID2

## 2021-02-01 LAB — GLUCOSE, CAPILLARY
Glucose-Capillary: 75 mg/dL (ref 70–99)
Glucose-Capillary: 78 mg/dL (ref 70–99)
Glucose-Capillary: 180 mg/dL — ABNORMAL HIGH (ref 70–99)
Glucose-Capillary: 135 mg/dL — ABNORMAL HIGH (ref 70–99)
Glucose-Capillary: 239 mg/dL — ABNORMAL HIGH (ref 70–99)
Glucose-Capillary: 206 mg/dL — ABNORMAL HIGH (ref 70–99)

## 2021-02-01 LAB — BASIC METABOLIC PANEL
Anion gap: 8 (ref 5–15)
BUN: 40 mg/dL — ABNORMAL HIGH (ref 8–23)
CO2: 29 mmol/L (ref 22–32)
Calcium: 8.5 mg/dL — ABNORMAL LOW (ref 8.9–10.3)
Chloride: 100 mmol/L (ref 98–111)
Creatinine, Ser: 1.89 mg/dL — ABNORMAL HIGH (ref 0.44–1.00)
GFR, Estimated: 27 mL/min — ABNORMAL LOW (ref >60)
Glucose, Bld: 71 mg/dL (ref 70–99)
Potassium: 3.7 mmol/L (ref 3.5–5.1)
Sodium: 137 mmol/L (ref 135–145)

## 2021-02-01 LAB — CBC
HCT: 30.5 % — ABNORMAL LOW (ref 36.0–46.0)
Hemoglobin: 9.9 g/dL — ABNORMAL LOW (ref 12.0–15.0)
MCH: 29 pg (ref 26.0–34.0)
MCHC: 32.5 g/dL (ref 30.0–36.0)
MCV: 89.4 fL (ref 80.0–100.0)
Platelets: 262 K/uL (ref 150–400)
RBC: 3.41 MIL/uL — ABNORMAL LOW (ref 3.87–5.11)
RDW: 14.4 % (ref 11.5–15.5)
WBC: 7.5 K/uL (ref 4.0–10.5)
nRBC: 0 % (ref 0.0–0.2)

## 2021-02-01 LAB — PROTIME-INR
INR: 2.3 — ABNORMAL HIGH (ref 0.8–1.2)
Prothrombin Time: 24.8 seconds — ABNORMAL HIGH (ref 11.4–15.2)

## 2021-02-01 LAB — HEPARIN LEVEL (UNFRACTIONATED): Heparin Unfractionated: 0.46 IU/mL (ref 0.30–0.70)

## 2021-02-01 MED ORDER — POTASSIUM CHLORIDE CRYS ER 20 MEQ PO TBCR: 40.0000 meq | EXTENDED_RELEASE_TABLET | Freq: Once | ORAL | Status: DC

## 2021-02-01 MED ORDER — INSULIN ASPART 100 UNIT/ML ~~LOC~~ SOLN
0.0000 [IU] | Freq: Three times a day (TID) | SUBCUTANEOUS | Status: DC
  Administered 2021-02-01: 2 [IU] via SUBCUTANEOUS
  Administered 2021-02-01: 3 [IU] via SUBCUTANEOUS
  Administered 2021-02-01: 1 [IU] via SUBCUTANEOUS
  Administered 2021-02-02 – 2021-02-03 (×3): 2 [IU] via SUBCUTANEOUS
  Administered 2021-02-03: 1 [IU] via SUBCUTANEOUS
  Administered 2021-02-03: 2 [IU] via SUBCUTANEOUS
  Administered 2021-02-04: 5 [IU] via SUBCUTANEOUS
  Administered 2021-02-04: 1 [IU] via SUBCUTANEOUS
  Administered 2021-02-04: 3 [IU] via SUBCUTANEOUS
  Administered 2021-02-05: 1 [IU] via SUBCUTANEOUS
  Administered 2021-02-05: 2 [IU] via SUBCUTANEOUS
  Administered 2021-02-06: 3 [IU] via SUBCUTANEOUS

## 2021-02-01 MED ORDER — GLIPIZIDE ER 5 MG PO TB24
5.0000 mg | ORAL_TABLET | Freq: Every evening | ORAL | Status: DC
  Administered 2021-02-01: 5 mg via ORAL
  Filled 2021-02-01 (×3): qty 1

## 2021-02-01 MED ORDER — FUROSEMIDE 10 MG/ML IJ SOLN
40.0000 mg | Freq: Once | INTRAMUSCULAR | Status: AC
  Administered 2021-02-01: 40 mg via INTRAVENOUS
  Filled 2021-02-01: qty 4

## 2021-02-01 MED ORDER — WARFARIN SODIUM 5 MG PO TABS
5.0000 mg | ORAL_TABLET | Freq: Once | ORAL | Status: AC
  Administered 2021-02-01: 5 mg via ORAL
  Filled 2021-02-01: qty 1

## 2021-02-01 MED ORDER — INSULIN ASPART 100 UNIT/ML ~~LOC~~ SOLN
0.0000 [IU] | Freq: Every day | SUBCUTANEOUS | Status: DC
  Administered 2021-02-01 – 2021-02-02 (×2): 2 [IU] via SUBCUTANEOUS
  Administered 2021-02-03: 3 [IU] via SUBCUTANEOUS
  Administered 2021-02-04: 4 [IU] via SUBCUTANEOUS
  Administered 2021-02-05: 2 [IU] via SUBCUTANEOUS

## 2021-02-01 MED ORDER — WARFARIN - PHARMACIST DOSING INPATIENT: Freq: Every day | Status: DC

## 2021-02-01 NOTE — Progress Notes (Signed)
[place] for heparin > warfarin  Indication: Hx PE, aflutter  No Known Allergies  Patient Measurements: Height: '5\' 5"'$  (165.1 cm) Weight: 87.6 kg (193 lb 2 oz) IBW/kg (Calculated) : 57 Heparin Dosing Weight: 77kg  Vital Signs: Temp: 99.9 F (37.7 C) (04/21 0800) Temp Source: Oral (04/21 0800) BP: 104/48 (04/21 0800) Pulse Rate: 68 (04/21 0800)  Labs: Recent Labs    01/30/21 2010 01/30/21 2044 01/30/21 2131 01/30/21 2202 01/31/21 0222 01/31/21 0932 01/31/21 1852 02/01/21 0415  HGB 13.0   < > 12.1  --  11.6*  --   --  9.9*  HCT 41.5   < > 38.Caitlin  --  36.7  --   --  30.5*  PLT 365  --  318  --  301  --   --  262  LABPROT 19.Caitlin*  --   --   --   --   --   --  24.Caitlin*  INR 1.7*  --   --   --   --   --   --  2.3*  HEPARINUNFRC  --   --   --   --   --  0.16* 0.23* 0.46  CREATININE 1.65*  --  1.53*  --  1.60*  --   --  1.89*  TROPONINIHS 23*  --   --  64*  --   --   --   --    < > = values in this interval not displayed.    Estimated Creatinine Clearance: 25.9 mL/min (A) (by C-G formula based on SCr of 1.89 mg/dL (H)).   Medical History: Past Medical History:  Diagnosis Date  . Arthritis   . Atrial flutter ([place])   . Bilateral pulmonary embolism ([place]) 08/2008   On coumadin  . Coronary artery disease    s/p DES to mid RCA in 02/2008; s/p inferior MI with DES to mid RCA and mid LAD in [place]/2006; DES to mid RCA 07/2020  . Essential hypertension   . Hyperlipidemia   . Ischemic cardiomyopathy   . Memory disorder 07/08/2017  . Myocardial infarction ([place])   . Stroke ([place])   . Type 2 diabetes mellitus ([facility])    Assessment: [name] presenting with SOB, hx of PE and aflutter on warfarin PTA.  INR subtherapeutic on admission at 1.7 so will proceed with heparin gtt while holding warfarin for possible procedures.   No procedures planned, pharmacy asked to switch back to warfarin. INR 2.3 today after holding warfarin yesterday. Last dose was 4/19 PM.  Hgb down 11.6>9.9, pltc 262. No overt bleeding noted. Will resume PTA dose.  PTA regimen: 5 mg daily INR on admit: 1.7  Goal of Therapy:  INR 2-3 Monitor platelets by anticoagulation protocol: Yes   Plan:  Stop heparin Warfarin 5 mg PO x1 tonight Monitor daily PT/INR, CBC, s/sx bleeding  Richardine Service, PharmD, BCPS PGY2 Cardiology Pharmacy Resident Phone: [phone] 02/01/2021  9:13 AM  Please check [email or web address] for unit-specific pharmacy phone numbers.

## 2021-02-01 NOTE — Evaluation (Addendum)
Occupational Therapy Evaluation Patient Details Name: [name] MRN: [id number] DOB: March 29, 1941 Today's Date: 02/01/2021    History of Present Illness 80 yo admitted 4/19 with respiratory failure with pulmonary edema and heart failure, intubated 4/19-4/20. PMHx: Afib, CVA, HLD, HTN, CAD, HFpEF, DM   Clinical Impression   Patient admitted for the diagnosis above.  PTA she lived at her home, her grandson was there at night, and her sister and daughter covered meals and community mobility during the day.  Barriers are listed below.  Currently, she is needing up to supervision for basic in room mobility/toileting, and up to [place] for lower body ADL.  The family is planning on her returning home with prior supports, a new RW, a shower chair, and possibly life alert.  OT to follow in the acute setting to maximize her functional status and assist with a safe transition home.    Follow Up Recommendations  No OT follow up    Equipment Recommendations  Other (comment) [phone])    Recommendations for Other Services       Precautions / Restrictions Precautions Precautions: Fall Restrictions Weight Bearing Restrictions: No      Mobility Bed Mobility               General bed mobility comments: in chair on arrival and end of session    Transfers Overall transfer level: Needs assistance Equipment used: Rolling walker (2 wheeled)             General transfer comment: overall supervision due to lines and leads.    Balance Overall balance assessment: Needs assistance Sitting-balance support: Feet supported Sitting balance-Leahy Scale: Good     Standing balance support: Bilateral upper extremity supported;No upper extremity supported Standing balance-Leahy Scale: Fair Standing balance comment: able to stand for peri care and grooming task not holding onto RW                           ADL either performed or assessed with clinical judgement   ADL Overall  ADL's : Needs assistance/impaired Eating/Feeding: Independent;Sitting   Grooming: Supervision/safety;Standing   Upper Body Bathing: Set up;Sitting   Lower Body Bathing: Minimal assistance;Sit to/from stand   Upper Body Dressing : Set up;Sitting   Lower Body Dressing: Minimal assistance;Sit to/from stand   Toilet Transfer: Supervision/safety;RW   Toileting- Water quality scientist and Hygiene: Supervision/safety;Sit to/from stand                                   Pertinent Vitals/Pain Pain Assessment: 0-10 Pain Score: 0-No pain Pain Intervention(s): Monitored during session     Hand Dominance Right   Extremity/Trunk Assessment Upper Extremity Assessment Upper Extremity Assessment: Generalized weakness   Lower Extremity Assessment Lower Extremity Assessment: Defer to PT evaluation   Cervical / Trunk Assessment Cervical / Trunk Assessment: Kyphotic   Communication Communication Communication: No difficulties   Cognition Arousal/Alertness: Awake/alert Behavior During Therapy: WFL for tasks assessed/performed Overall Cognitive Status: History of cognitive impairments - at baseline                                 General Comments: ST memory primarily affected.   General Comments       Exercises     Shoulder Instructions      Home Living Family/patient expects to  be discharged to:: Private residence Living Arrangements: Children Available Help at Discharge: Family;Available 24 hours/day Type of Home: House Home Access: Ramped entrance     Home Layout: One level     Bathroom Shower/Tub: Occupational psychologist: Standard Bathroom Accessibility: Yes How Accessible: Accessible via walker Home Equipment: Bedside commode          Prior Functioning/Environment Level of Independence: Needs assistance  Gait / Transfers Assistance Needed: walks with Rw at all times ADL's / Homemaking Assistance Needed: performs bathing and  dressing on her own. Daughter does the grocery shopping, cleaning and leaves quick prep microwave meals   Comments: Grandson lives with her but is in school so only there at night, daughter and sister cover during the day.        OT Problem List: Decreased strength;Impaired balance (sitting and/or standing);Increased edema      OT Treatment/Interventions: Self-care/ADL training;Therapeutic exercise;DME and/or AE instruction;Therapeutic activities;Balance training    OT Goals(Current goals can be found in the care plan section) Acute Rehab OT Goals Patient Stated Goal: Return home OT Goal Formulation: With patient Time For Goal Achievement: 02/15/21 Potential to Achieve Goals: Good ADL Goals Pt Will Perform Grooming: with modified independence;standing Pt Will Perform Lower Body Bathing: with modified independence;sit to/from stand Pt Will Perform Lower Body Dressing: with modified independence;sit to/from stand;with adaptive equipment Pt Will Transfer to Toilet: with modified independence;ambulating;regular height toilet Pt Will Perform Toileting - Clothing Manipulation and hygiene: with modified independence;sit to/from stand  OT Frequency: Min 2X/week   Barriers to D/C:    none noted       Co-evaluation              AM-PAC OT "6 Clicks" Daily Activity     Outcome Measure Help from another person eating meals?: None Help from another person taking care of personal grooming?: None Help from another person toileting, which includes using toliet, bedpan, or urinal?: None Help from another person bathing (including washing, rinsing, drying)?: A Little Help from another person to put on and taking off regular upper body clothing?: None Help from another person to put on and taking off regular lower body clothing?: A Little 6 Click Score: 22   End of Session Equipment Utilized During Treatment: Rolling walker Nurse Communication: Mobility status  Activity Tolerance:  Patient tolerated treatment well Patient left: in chair;with call bell/phone within reach  OT Visit Diagnosis: Unsteadiness on feet (R26.81)                Time: [id number] OT Time Calculation (min): 17 min Charges:  OT General Charges $OT Visit: 1 Visit OT Evaluation $OT Eval Moderate Complexity: 1 Mod  02/01/2021  Rich, OTR/L  Acute Rehabilitation Services  Office:  [phone]   Metta Clines 02/01/2021, 11:32 AM

## 2021-02-01 NOTE — Evaluation (Signed)
Physical Therapy Evaluation/ Discharge Patient Details Name: [name] MRN: [id number] DOB: 02-Oct-1941 Today's Date: 02/01/2021   History of Present Illness  80 yo admitted 4/19 with respiratory failure with pulmonary edema and heart failure, intubated 4/19-4/20. PMHx: Afib, CVA, HLD, HTN, CAD, HFpEF, DM  Clinical Impression  Pt very pleasant, sitting in chair on arrival and able to walk unit with use of RW without additional assist. Pt with HR 70-107 during activity and SpO2 92-94% on RA throughout. Pt has assist of family at home and reports feeling back to her baseline functional status. Pt moving well with all needed DME and encouraged to continue to walk with nursing supervision for lines acutely. No further acute therapy needs with pt aware and agreeable. Will sign off.    Follow Up Recommendations No PT follow up    Equipment Recommendations  None recommended by PT    Recommendations for Other Services       Precautions / Restrictions Precautions Precautions: Fall      Mobility  Bed Mobility               General bed mobility comments: in chair on arrival and end of session    Transfers Overall transfer level: Modified independent                  Ambulation/Gait Ambulation/Gait assistance: Modified independent (Device/Increase time) Gait Distance (Feet): 400 Feet Assistive device: Rolling walker (2 wheeled) Gait Pattern/deviations: Step-through pattern;Decreased stride length;Trunk flexed   Gait velocity interpretation: >4.37 ft/sec, indicative of normal walking speed General Gait Details: min cues for upright posture but this is pt's baseline  Stairs            Wheelchair Mobility    Modified Rankin (Stroke Patients Only)       Balance Overall balance assessment: Needs assistance   Sitting balance-Leahy Scale: Good     Standing balance support: Bilateral upper extremity supported;No upper extremity supported Standing  balance-Leahy Scale: Fair Standing balance comment: able to stand at sink without UE support, RW for gait                             Pertinent Vitals/Pain Pain Assessment: No/denies pain    Home Living Family/patient expects to be discharged to:: Private residence Living Arrangements: Children Available Help at Discharge: Available PRN/intermittently Type of Home: House Home Access: Ramped entrance     Home Layout: One level Home Equipment: Environmental consultant - 2 wheels      Prior Function Level of Independence: Needs assistance   Gait / Transfers Assistance Needed: walks with Rw at all times  ADL's / Homemaking Assistance Needed: performs bathing and dressing on her own. Daughter does the grocery shopping, cleaning and leaves quick prep microwave meals  Comments: Grandson lives with her but is in school so only there at night     Hand Dominance        Extremity/Trunk Assessment   Upper Extremity Assessment Upper Extremity Assessment: Overall WFL for tasks assessed    Lower Extremity Assessment Lower Extremity Assessment: Overall WFL for tasks assessed    Cervical / Trunk Assessment Cervical / Trunk Assessment: Kyphotic  Communication   Communication: No difficulties  Cognition Arousal/Alertness: Awake/alert Behavior During Therapy: WFL for tasks assessed/performed Overall Cognitive Status: Within Functional Limits for tasks assessed  General Comments      Exercises     Assessment/Plan    PT Assessment Patent does not need any further PT services  PT Problem List         PT Treatment Interventions      PT Goals (Current goals can be found in the Care Plan section)  Acute Rehab PT Goals PT Goal Formulation: All assessment and education complete, DC therapy    Frequency     Barriers to discharge        Co-evaluation               AM-PAC PT "6 Clicks" Mobility  Outcome Measure  Help needed turning from your back to your side while in a flat bed without using bedrails?: None Help needed moving from lying on your back to sitting on the side of a flat bed without using bedrails?: None Help needed moving to and from a bed to a chair (including a wheelchair)?: None Help needed standing up from a chair using your arms (e.g., wheelchair or bedside chair)?: None Help needed to walk in hospital room?: A Little Help needed climbing 3-5 steps with a railing? : A Little 6 Click Score: 22    End of Session Equipment Utilized During Treatment: Gait belt Activity Tolerance: Patient tolerated treatment well Patient left: in chair;with call bell/phone within reach;with nursing/sitter in room Nurse Communication: Mobility status PT Visit Diagnosis: Other abnormalities of gait and mobility (R26.89)    Time: [id number] PT Time Calculation (min) (ACUTE ONLY): 17 min   Charges:   PT Evaluation $PT Eval Moderate Complexity: 1 Mod          Craige Patel P, PT Acute Rehabilitation Services Pager: [phone] Office: [phone]   Sandy Salaam Chelsye Suhre 02/01/2021, 9:26 AM

## 2021-02-01 NOTE — Progress Notes (Signed)
PHARMACY - PHYSICIAN COMMUNICATION CRITICAL VALUE ALERT - BLOOD CULTURE IDENTIFICATION (BCID)  [name] is an 80 y.o. female who presented to [facility] on 01/30/2021 with a chief complaint of respiratory failure in the setting of volume overload  Name of physician (or Provider) Contacted: Dr. Oletta Darter  Current antibiotics: None  Changes to prescribed antibiotics recommended:  Likely contaminant, cont to monitor off anti-biotics   Results for orders placed or performed during the hospital encounter of 01/30/21  Blood Culture ID Panel (Reflexed) (Collected: 01/30/2021  9:50 PM)  Result Value Ref Range   Enterococcus faecalis NOT DETECTED NOT DETECTED   Enterococcus Faecium NOT DETECTED NOT DETECTED   Listeria monocytogenes NOT DETECTED NOT DETECTED   Staphylococcus species DETECTED (A) NOT DETECTED   Staphylococcus aureus (BCID) NOT DETECTED NOT DETECTED   Staphylococcus epidermidis DETECTED (A) NOT DETECTED   Staphylococcus lugdunensis NOT DETECTED NOT DETECTED   Streptococcus species NOT DETECTED NOT DETECTED   Streptococcus agalactiae NOT DETECTED NOT DETECTED   Streptococcus pneumoniae NOT DETECTED NOT DETECTED   Streptococcus pyogenes NOT DETECTED NOT DETECTED   A.calcoaceticus-baumannii NOT DETECTED NOT DETECTED   Bacteroides fragilis NOT DETECTED NOT DETECTED   Enterobacterales NOT DETECTED NOT DETECTED   Enterobacter cloacae complex NOT DETECTED NOT DETECTED   Escherichia coli NOT DETECTED NOT DETECTED   Klebsiella aerogenes NOT DETECTED NOT DETECTED   Klebsiella oxytoca NOT DETECTED NOT DETECTED   Klebsiella pneumoniae NOT DETECTED NOT DETECTED   Proteus species NOT DETECTED NOT DETECTED   Salmonella species NOT DETECTED NOT DETECTED   Serratia marcescens NOT DETECTED NOT DETECTED   Haemophilus influenzae NOT DETECTED NOT DETECTED   Neisseria meningitidis NOT DETECTED NOT DETECTED   Pseudomonas aeruginosa NOT DETECTED NOT DETECTED   Stenotrophomonas maltophilia  NOT DETECTED NOT DETECTED   Candida albicans NOT DETECTED NOT DETECTED   Candida auris NOT DETECTED NOT DETECTED   Candida glabrata NOT DETECTED NOT DETECTED   Candida krusei NOT DETECTED NOT DETECTED   Candida parapsilosis NOT DETECTED NOT DETECTED   Candida tropicalis NOT DETECTED NOT DETECTED   Cryptococcus neoformans/gattii NOT DETECTED NOT DETECTED   Methicillin resistance mecA/C DETECTED (A) NOT DETECTED    Narda Bonds 02/01/2021  3:01 AM

## 2021-02-01 NOTE — Progress Notes (Signed)
During report, patient daughter at bedside.  Daughter is very upset that she nor any family has been informed of anything regarding patient condition from a provider.  [place] notified.

## 2021-02-01 NOTE — Progress Notes (Signed)
  Speech Language Pathology Treatment: Dysphagia  Patient Details Name: [name] MRN: [id number] DOB: [birth year] Today's Date: 02/01/2021 Time: 1215-1225 SLP Time Calculation (min) (ACUTE ONLY): 10 min  Assessment / Plan / Recommendation Clinical Impression  No further issues with swallowing have been identified.  Pt is eating a regular consistency diet with thin liquids. She describes no difficulties.  She was observed with regular solids, multiple/successive boluses of thin liquid, and mixed consistencies with adequate mastication, brisk swallow response, and no s/s of aspiration.  No cueing needed. Dysphagia resolved. SLP service will sign off.   HPI HPI: Pt is an 80 yo female with acute hypercarbic and hypoxemic respiratory failure secondary to volume overload of her heart. Intubated from 4/19 to 4/20. CXR 01/31/21: "Multifocal bilateral pulmonary infiltrates/edema again noted without interim change. Atelectatic changes right upper lobe. Small bilateral pleural effusions." PMH significant for CVA, DM, myocardial infarction, HTN, bilateral pulmonary embolism.      SLP Plan  All goals met       Recommendations  Diet recommendations: Regular;Thin liquid Liquids provided via: Straw;Cup Medication Administration: Whole meds with liquid Supervision: Patient able to self feed                Oral Care Recommendations: Oral care BID Follow up Recommendations: None SLP Visit Diagnosis: Dysphagia, unspecified (R13.10) Plan: All goals met       GO               Chioke Noxon L. Tivis Ringer, [place] CCC/SLP Acute Rehabilitation Services Office number [phone] Pager [phone]  [name] 02/01/2021, 12:35 PM

## 2021-02-01 NOTE — Progress Notes (Signed)
Pt's daughter, [name] [phone]), would like to be contacted by the MDs in the morning regarding the pt's plan of care.

## 2021-02-01 NOTE — Progress Notes (Signed)
NAME:  [name], MRN:  [id number], DOB:  [birth year], LOS: 2 ADMISSION DATE:  01/30/2021, CONSULTATION DATE:  01/30/21 REFERRING MD:  Dr Colvin Caroli, CHIEF COMPLAINT:  Sob and resp failure  History of Present Illness:  80 yo female with pmh afib on chronic coumadin, h/o pe's, h/o cva, hyperlipidemia, HTN, cad s/p DES to RCA 2006/2009/2021, HFpEF, dm2 who presented with progressive sob and hypoxic resp failure.   All history is obtained from chart review, son and ed physician as pt is intubated and sedated at this time.   Per son pt has complained of sob for past few days that has progressively worsened. No f/c, cough but not different from baseline. Pt has known cardiac history and today sob progressed to point she called EMS. When they arrived pt's sats were 78% and was placed on NIV with improvement in saturations to 90's but subsequently pt became unresponsive. Ems transitioned her to NRB with stable saturations but due to GCS 3 on arrival pt was intubated for airway protection.   CXR revealed pulmonary edema. She has thin frothy sputum in copious quantities coming from ett. She is unresponsive but s/p paralytic for intubation.    Pertinent  Medical History  HFpEF CAD s/p mutiple PCI with DES to RCA  afib B/l pe [id number]  Significant Hospital Events: Including procedures, antibiotic start and stop dates in addition to other pertinent events   . 4/19 admitted  Interim History / Subjective:  No events. On 2L for desats while sleeping. Pressure remain borderline. States she feels well, just weak.  Objective   Blood pressure (!) 104/48, pulse 68, temperature 99.9 F (37.7 C), temperature source Oral, resp. rate 18, height '5\' 5"'$  (1.651 m), weight 87.6 kg, SpO2 94 %.    Vent Mode: BIPAP;PCV FiO2 (%):  [40 %] 40 % Set Rate:  [15 bmp] 15 bmp PEEP:  [5 cmH20] 5 cmH20   Intake/Output Summary (Last 24 hours) at 02/01/2021 0843 Last data filed at 02/01/2021 0700 Gross per 24 hour   Intake 764.55 ml  Output 2620 ml  Net -1855.45 ml   Filed Weights   01/31/21 0500 01/31/21 1030 02/01/21 0500  Weight: 89 kg 89 kg 87.6 kg    Examination: Constitutional: no acute distress sitting in chair  Eyes: EOMI, pupils equal Ears, nose, mouth, and throat: MMM, malampatti 3 Cardiovascular: RRR, ext warm Respiratory: crackles at bases improved, normal RR Gastrointestinal: soft, hypoactive BS Skin: No rashes, normal turgor Neurologic: moves all 4 ext to command, weak globally Psychiatric: pleasant, AOx3     Labs/imaging that I havepersonally reviewed  (right click and "Reselect all SmartList Selections" daily)  BUN/Cr up slightly Sugars low CBC benign INR 2.3  Resolved Hospital Problem list     Assessment & Plan:  Acute hypercarbic and hypoxemic respiratory failure due to volume overloaded state of heart.  resolved Hypercarbic encephalopathy resolved CAD with DES in place HFPEF CKD 3b- a bit worse renal function today, good UoP with diuretics 4/20 Shock mostly related to sedation- resolved Afib and hx of PE on warfarin PTA DM2 with hypoglycemia  - Further GDMT per cardiology - Hold heparin, resume home warfarin - Encourage IS - Hold lantus, resume home glipizide - PT/OT consults - Would stop continuous pulse ox, on history she has a lot of OSA type symptoms so I think just is having apneic events; will get her in to see sleep clinic - Stable for telemetry, appreciate [facility] taking over starting 4/22  Erskine Emery  MD PCCM

## 2021-02-01 NOTE — Progress Notes (Signed)
[facility] Progress Note Patient Name: [name] DOB: Feb 13, 1941 MRN: [id number]   Date of Service  02/01/2021  HPI/Events of Note  Hypoxia - Nursing notes increase in O2 to 3-4 L/min [place]. DDx includes OSA vs Atelectasis vs CHF. Daughter is upset that PCCM day rounding team has not keep her informed.   eICU Interventions  Plan: 1. Portable CXR STAT.     Intervention Category Major Interventions: Hypoxemia - evaluation and management  Camisha Srey [place] 02/01/2021, 8:05 PM

## 2021-02-01 NOTE — Progress Notes (Signed)
[facility] Progress Note Patient Name: [name] DOB: 03/10/1941 MRN: [id number]   Date of Service  02/01/2021  HPI/Events of Note  Review of portable CXR reveals cardiomegaly with vascular congestion and probable mild interstitial edema. Creatinine = 1.89.  eICU Interventions  Plan: 1. Lasix 40 mg IV X 1 now.      Intervention Category Major Interventions: Hypoxemia - evaluation and management  Yoseph Haile Cornelia Copa 02/01/2021, 8:59 PM

## 2021-02-02 ENCOUNTER — Encounter (HOSPITAL_COMMUNITY): Payer: Self-pay | Admitting: Critical Care Medicine

## 2021-02-02 DIAGNOSIS — I161 Hypertensive emergency: Secondary | ICD-10-CM

## 2021-02-02 DIAGNOSIS — I509 Heart failure, unspecified: Secondary | ICD-10-CM | POA: Diagnosis not present

## 2021-02-02 DIAGNOSIS — I5043 Acute on chronic combined systolic (congestive) and diastolic (congestive) heart failure: Secondary | ICD-10-CM

## 2021-02-02 LAB — GLUCOSE, CAPILLARY
Glucose-Capillary: 94 mg/dL (ref 70–99)
Glucose-Capillary: 182 mg/dL — ABNORMAL HIGH (ref 70–99)
Glucose-Capillary: 188 mg/dL — ABNORMAL HIGH (ref 70–99)
Glucose-Capillary: 239 mg/dL — ABNORMAL HIGH (ref 70–99)

## 2021-02-02 LAB — TROPONIN I (HIGH SENSITIVITY): Troponin I (High Sensitivity): 416 ng/L (ref <18)

## 2021-02-02 LAB — BASIC METABOLIC PANEL
Anion gap: 7 (ref 5–15)
BUN: 37 mg/dL — ABNORMAL HIGH (ref 8–23)
CO2: 32 mmol/L (ref 22–32)
Calcium: 8.6 mg/dL — ABNORMAL LOW (ref 8.9–10.3)
Chloride: 101 mmol/L (ref 98–111)
Creatinine, Ser: 1.64 mg/dL — ABNORMAL HIGH (ref 0.44–1.00)
GFR, Estimated: 31 mL/min — ABNORMAL LOW (ref >60)
Glucose, Bld: 92 mg/dL (ref 70–99)
Potassium: 3.5 mmol/L (ref 3.5–5.1)
Sodium: 140 mmol/L (ref 135–145)

## 2021-02-02 LAB — CBC
HCT: 30.2 % — ABNORMAL LOW (ref 36.0–46.0)
Hemoglobin: 9.7 g/dL — ABNORMAL LOW (ref 12.0–15.0)
MCH: 28.7 pg (ref 26.0–34.0)
MCHC: 32.1 g/dL (ref 30.0–36.0)
MCV: 89.3 fL (ref 80.0–100.0)
Platelets: 262 10*3/uL (ref 150–400)
RBC: 3.38 MIL/uL — ABNORMAL LOW (ref 3.87–5.11)
RDW: 14.3 % (ref 11.5–15.5)
WBC: 4.7 10*3/uL (ref 4.0–10.5)
nRBC: 0 % (ref 0.0–0.2)

## 2021-02-02 LAB — CULTURE, BLOOD (ROUTINE X 2): Special Requests: ADEQUATE

## 2021-02-02 LAB — PROTIME-INR
INR: 1.8 — ABNORMAL HIGH (ref 0.8–1.2)
Prothrombin Time: 20.9 seconds — ABNORMAL HIGH (ref 11.4–15.2)

## 2021-02-02 MED ORDER — FUROSEMIDE 10 MG/ML IJ SOLN
40.0000 mg | Freq: Two times a day (BID) | INTRAMUSCULAR | Status: DC
  Administered 2021-02-02 – 2021-02-03 (×3): 40 mg via INTRAVENOUS
  Filled 2021-02-02 (×3): qty 4

## 2021-02-02 MED ORDER — ALPRAZOLAM 0.25 MG PO TABS
0.1250 mg | ORAL_TABLET | Freq: Every morning | ORAL | Status: DC
  Administered 2021-02-03 – 2021-02-06 (×4): 0.125 mg via ORAL
  Filled 2021-02-02 (×4): qty 1

## 2021-02-02 MED ORDER — FUROSEMIDE 10 MG/ML IJ SOLN
40.0000 mg | Freq: Once | INTRAMUSCULAR | Status: AC
  Administered 2021-02-02: 40 mg via INTRAVENOUS
  Filled 2021-02-02: qty 4

## 2021-02-02 MED ORDER — ALPRAZOLAM 0.25 MG PO TABS
0.2500 mg | ORAL_TABLET | Freq: Every day | ORAL | Status: DC
  Administered 2021-02-02 – 2021-02-05 (×4): 0.25 mg via ORAL
  Filled 2021-02-02 (×4): qty 1

## 2021-02-02 MED ORDER — CARVEDILOL 6.25 MG PO TABS
6.2500 mg | ORAL_TABLET | Freq: Two times a day (BID) | ORAL | Status: DC
  Administered 2021-02-02 – 2021-02-06 (×8): 6.25 mg via ORAL
  Filled 2021-02-02 (×8): qty 1

## 2021-02-02 MED ORDER — WARFARIN SODIUM 7.5 MG PO TABS
7.5000 mg | ORAL_TABLET | Freq: Once | ORAL | Status: AC
  Administered 2021-02-02: 7.5 mg via ORAL
  Filled 2021-02-02: qty 1

## 2021-02-02 MED ORDER — POTASSIUM CHLORIDE CRYS ER 20 MEQ PO TBCR
20.0000 meq | EXTENDED_RELEASE_TABLET | Freq: Every day | ORAL | Status: DC
  Administered 2021-02-02 – 2021-02-06 (×5): 20 meq via ORAL
  Filled 2021-02-02 (×5): qty 1

## 2021-02-02 MED ORDER — PANTOPRAZOLE SODIUM 40 MG PO TBEC
40.0000 mg | DELAYED_RELEASE_TABLET | Freq: Every day | ORAL | Status: DC
  Administered 2021-02-02 – 2021-02-05 (×3): 40 mg via ORAL
  Filled 2021-02-02 (×3): qty 1

## 2021-02-02 MED ORDER — SODIUM CHLORIDE 0.9% FLUSH
3.0000 mL | Freq: Two times a day (BID) | INTRAVENOUS | Status: DC
  Administered 2021-02-02 – 2021-02-06 (×7): 3 mL via INTRAVENOUS

## 2021-02-02 MED ORDER — HEPARIN (PORCINE) 25000 UT/250ML-% IV SOLN
1300.0000 [IU]/h | INTRAVENOUS | Status: DC
  Administered 2021-02-02 – 2021-02-05 (×4): 1350 [IU]/h via INTRAVENOUS
  Filled 2021-02-02 (×4): qty 250

## 2021-02-02 MED ORDER — ATORVASTATIN CALCIUM 40 MG PO TABS
40.0000 mg | ORAL_TABLET | Freq: Every day | ORAL | Status: DC
  Administered 2021-02-03 – 2021-02-06 (×4): 40 mg via ORAL
  Filled 2021-02-02 (×4): qty 1

## 2021-02-02 MED ORDER — ALPRAZOLAM 0.25 MG PO TABS: 0.1250 mg | ORAL_TABLET | ORAL | Status: DC

## 2021-02-02 MED ORDER — FUROSEMIDE 20 MG PO TABS: 20.0000 mg | ORAL_TABLET | Freq: Every day | ORAL | Status: DC

## 2021-02-02 NOTE — Progress Notes (Signed)
  Mobility Specialist Criteria Algorithm Info.  SATURATION QUALIFICATIONS: (This note is used to comply with regulatory documentation for home oxygen)  Patient Saturations on Room Air at Rest = 100%  Patient Saturations on Room Air while Ambulating = 93%  Patient Saturations on 0 Liters of oxygen while Ambulating = n/a%  Please briefly explain why patient needs home oxygen:  Mobility Team: [facility] elevated:HOB 30 Activity: Ambulated in hall Range of motion: Active; All extremities Level of assistance: Standby assist, set-up cues, supervision of patient - no hands on Assistive device: Front wheel walker Minutes sitting in chair:  Minutes stood: 5 minutes Minutes ambulated: 5 minutes Distance ambulated (ft): 500 ft Mobility response: Tolerated well Bed Position: Semi-fowlers  Patient received lying supine in bed, pleasantly agreed to participate in mobility this morning. Got to the EOB independently and ambulated in hallway 500 feet with supervision. Tolerated ambulating without supplemental O2, saturating 93% RA. Pt did well without complaint or incident and is now lying back supine in bed with all needs met.     02/02/2021 12:23 PM

## 2021-02-02 NOTE — Consult Note (Addendum)
Cardiology Consult    Patient ID: [name] MRN: [id number], DOB/AGE: [id number]   Admit date: 01/30/2021 Date of Consult: 02/02/2021  Primary Physician: Celene Squibb, MD Primary Cardiologist: Rozann Lesches, MD Requesting Provider: Alben Deeds  Patient Profile    [name] is a 80 y.o. female with a history of coronary artery disease, ischemic cardiomyopathy, heart failure with midrange ejection fraction, hyperlipidemia, mild to moderate aortic stenosis, prior stroke, type 2 diabetes mellitus, atrial flutter status post catheter ablation, stage III chronic kidney disease vascular dementia, and bilateral PE on chronic Coumadin who is being seen today for the evaluation of non-STEMI and acute pulmonary edema at the request of Dr. Florene Glen.  Past Medical History   Past Medical History:  Diagnosis Date  . Arthritis   . Atrial flutter ([place])    a. 06/2013 s/p RFCA.  . Bilateral pulmonary embolism ([place]) 08/2008   a. Chronic coumadin.  . Carotid arterial disease ([place])    a. 03/2017 Carotid angio: RICA 0000000, LICA [id number].  . CKD (chronic kidney disease), stage III ([place])   . Coronary artery disease    a. 05/2005 Inf STEMI/PCI: severe LAD/LCX/RCA dzs->CABG advised, pt preferred PCI-->Cypher DES to mRCA & mLAD; b. 02/2008 PCI: ISR of RCA->Cypher DES. LAD patent; c. 07/2020 Inf STEMI/PCI: LM 30d, LAD 80m D1 80(small), RI 75, LCX 80ost->distal (small), RCA 25p/m, 1090m (3.0x26 Resolute Onyx DES).  . Essential hypertension   . HFrEF (heart failure with reduced ejection fraction) ([place]   a. 07/2020 Echo: EF 45-50% in setting of inf STEMI; b. 01/2021 Echo: EF 40-45%, glob HK, sev basal-septal LVH w/ grII DD. Nl RV fxn, RVSP 37.32m24m, sev dil LA, mild MR, mod AS.  . HMarland Kitchenperlipidemia   . Ischemic cardiomyopathy    a. 07/2020 Echo: EF 45-50%; b. 01/2021 Echo: EF 40-45%.  . Memory disorder 07/08/2017  . Moderate aortic stenosis    a. 01/2021 Echo: Mild-mod AS w/ mean grad 132m43m AoV  area 2.19m/6mmax). In setting of LV dysfxn, AS likely more moderate.  . Myocardial infarction (HCC) [place] Stroke (HCC) [place] Type 2 diabetes mellitus (HCC) [place] Vascular dementia (HCC)[facility] Past Surgical History:  Procedure Laterality Date  . ATRIAL FLUTTER ABLATION N/A 06/17/2013   Procedure: ATRIAL FLUTTER ABLATION;  Surgeon: JamesThompson Grayer  [facility];  Service: Cardiovascular;  Laterality: N/A;  . CAROTID PTA/STENT INTERVENTION N/A 04/10/2017   Procedure: Carotid PTA/Stent Intervention;  Surgeon: Dew, Algernon Huxley  [facility];  Service: Cardiovascular;  Laterality: N/A;  . CHOLECYSTECTOMY    . CORONARY STENT INTERVENTION N/A 07/14/2020   Procedure: CORONARY STENT INTERVENTION;  Surgeon: CoopeSherren Mocha  [facility];  Service: Cardiovascular;  Laterality: N/A;  . HIP SURGERY    . KNEE ARTHROSCOPY WITH MEDIAL MENISECTOMY Left 09/25/2017   Procedure: KNEE ARTHROSCOPY WITH MEDIAL AND LATERAL MENISECTOMY;  Surgeon: HarriCarole Civil  Location: [facility];  Service: Orthopedics;  Laterality: Left;  . LEFT HEART CATH AND CORONARY ANGIOGRAPHY N/A 07/14/2020   Procedure: LEFT HEART CATH AND CORONARY ANGIOGRAPHY;  Surgeon: CoopeSherren Mocha  [facility];  Service: Cardiovascular;  Laterality: N/A;     Allergies  No Known Allergies  History of Present Illness    80 years old female with the above complex past medical history including coronary artery disease, ischemic cardiomyopathy, heart failure with midrange ejection fraction, hyperlipidemia, mild  to moderate aortic stenosis, prior stroke, stage III chronic kidney disease, type diabetes mellitus, atrial flutter status post catheter ablation, vascular dementia, and bilateral PE on chronic warfarin.  She suffered an inferior MI in August 2006 and was found to have multivessel disease.  CABG was initially advised the patient preferred percutaneous intervention and she  subsequently underwent a drug-eluting stent placement to the mid RCA and mid LAD.  She had moderate diffuse circumflex disease, which was a small vessel, and was medically managed.  In 2009, she required repeat intervention in the setting of in-stent restenosis in the RCA and underwent drug-eluting stent placement.  At the time, the LAD stent was patent.  In September 2014, she underwent catheter ablation for atrial flutter.  In June 2018, she underwent evaluation for stroke with CT showing prior lacunar infarcts.  Shown to carotid angiography which showed mild nonobstructive disease.  In October 2021, she was admitted with recurrent inferior STEMI and was found to have total occlusion of the mid to distal RCA with in-stent restenosis.  This was treated with a drug-eluting stent.  Echocardiogram during admission showed an EF of 45 to 50%.  She was placed on aspirin, Plavix, and warfarin with aspirin being discontinued after 1 month.  She has been on Plavix and warfarin since and has tolerated this well.  Patient lives in [place] with her grandson.  She is relatively sedentary and uses a walker to ambulate around her home.  Since her MI, she notes that she had done well without recurrent symptoms or limitations.  In January of this year, she was diagnosed with COVID-19 though she notes that she had an uneventful course.  She was last seen via telemedicine visit by Dr. Domenic Polite in January, at which time she was doing well.    On the evening of April 16 however she had just gotten out of the shower and began to notice severe dyspnea.  She sat down and symptoms did not improve.  She called her daughter and subsequently called EMS.  Upon EMS arrival, patient was noted to be hypoxic with saturations of 70%.  She was initially placed on BiPAP but became unresponsive and required intubation for airway protection.  She was transported to [facility] where chest x-ray revealed pulmonary edema.  Lab work notable for a BNP  of 587.4.  Troponin was 23 at baseline but rose to 64 and subsequently 416.  Lactate 2.1.  Though initial ECG did not show any acute ST or T changes, ECG dated 4/20 at 0031 did show new deep anterolateral T wave inversion.  Patient was admitted to the intensive care unit where she remained intubated and received IV diuresis.  She responded well to diuretic therapy and is -4.2 L since admission.  She was able to be extubated on the morning of April 20.  She was stable throughout the remainder of the day and into April 21.  On the evening of April 21, nursing staff noted increasing dyspnea and oxygen requirement with nasal cannula increased to 4 L/min.  Chest x-ray was performed and showed cardiomegaly with vascular congestion and probable mild interstitial edema.  She was given a dose of 40 mg IV Lasix x1.  She was subsequently transferred to the floor earlier today and ordered an additional dose of lasix.  Patient has since been weaned off of oxygen and currently has no complaints.  She denies any history of chest pain/tightness prior to or since admission.  Inpatient Medications    . [  START ON 02/03/2021] ALPRAZolam  0.125 mg Oral q AM   And  . ALPRAZolam  0.25 mg Oral QHS  . atorvastatin  20 mg Oral Daily  . carvedilol  6.25 mg Oral BID  . chlorhexidine  15 mL Mouth Rinse BID  . Chlorhexidine Gluconate Cloth  6 each Topical Daily  . clopidogrel  75 mg Oral Daily  . docusate sodium  100 mg Oral BID  . furosemide  40 mg Intravenous BID  . [START ON 02/03/2021] furosemide  20 mg Oral Daily  . insulin aspart  0-5 Units Subcutaneous QHS  . insulin aspart  0-9 Units Subcutaneous TID WC  . mouth rinse  15 mL Mouth Rinse q12n4p  . pantoprazole  40 mg Oral QHS  . polyethylene glycol  17 g Oral Daily  . potassium chloride  20 mEq Oral Daily    Family History    Family History  Problem Relation Age of Onset  . Heart disease Other   . Arthritis Other   . Cancer Other   . Diabetes Other   . Heart  failure Mother    She indicated that her mother is deceased. She indicated that her father is deceased.   Social History    Social History   Socioeconomic History  . Marital status: Married    Spouse name: Not on file  . Number of children: 3  . Years of education: Some college  . Highest education level: Not on file  Occupational History  . Occupation: Retired  Tobacco Use  . Smoking status: Former Smoker    Years: 10.00    Types: Cigarettes    Quit date: 10/14/1978    Years since quitting: 42.3  . Smokeless tobacco: Never Used  Vaping Use  . Vaping Use: Never used  Substance and Sexual Activity  . Alcohol use: No  . Drug use: No  . Sexual activity: Not on file  Other Topics Concern  . Not on file  Social History Narrative   Lives in [place].  Her college aged grandson lives with her.  She ambulates with a walker and thus does not routinely exercise.   Social Determinants of Health   Financial Resource Strain: Not on file  Food Insecurity: Not on file  Transportation Needs: Not on file  Physical Activity: Not on file  Stress: Not on file  Social Connections: Not on file  Intimate Partner Violence: Not on file     Review of Systems    General:  No chills, fever, night sweats or weight changes.  Cardiovascular:  No chest pain, +++ dyspnea on exertion, edema, orthopnea, palpitations, paroxysmal nocturnal dyspnea. Dermatological: No rash, lesions/masses Respiratory: No cough, dyspnea Urologic: No hematuria, dysuria Abdominal:   No nausea, vomiting, diarrhea, bright red blood per rectum, melena, or hematemesis Neurologic:  No visual changes, wkns, changes in mental status. All other systems reviewed and are otherwise negative except as noted above.  Physical Exam    Blood pressure (!) 145/79, pulse 80, temperature 98 F (36.7 C), resp. rate 18, height '5\' 6"'$  (1.676 m), weight 88 kg, SpO2 94 %.  General: Pleasant, NAD Psych: Normal affect. Neuro: Alert and oriented  X 3. Moves all extremities spontaneously. HEENT: Normal  Neck: Supple without bruits, moderately elevated JVP. Lungs:  Resp regular and unlabored, bibasilar crackles Heart: RRR, 2/6 systolic ejection murmur at the upper sternal borders, no s3, s4. Abdomen: Obese, semifirm, non-tender, non-distended, BS + x 4.  Extremities: No clubbing, cyanosis or edema.  DP/PT2+, Radials 2+ and equal bilaterally.  Labs    Cardiac Enzymes Recent Labs  Lab 01/30/21 2010 01/30/21 2202 02/02/21 1142  TROPONINIHS 23* 64* 416*      Lab Results  Component Value Date   WBC 4.7 02/02/2021   HGB 9.7 (L) 02/02/2021   HCT 30.2 (L) 02/02/2021   MCV 89.3 02/02/2021   PLT 262 02/02/2021    Recent Labs  Lab 01/30/21 2010 01/30/21 2044 02/02/21 0425  NA 134*   < > 140  K 4.6   < > 3.5  CL 96*   < > 101  CO2 29   < > 32  BUN 32*   < > 37*  CREATININE 1.65*   < > 1.64*  CALCIUM 8.9   < > 8.6*  PROT 7.7  --   --   BILITOT 0.6  --   --   ALKPHOS 93  --   --   ALT 20  --   --   AST 29  --   --   GLUCOSE 302*   < > 92   < > = values in this interval not displayed.   Lab Results  Component Value Date   CHOL 177 07/14/2020   HDL 62 07/14/2020   LDLCALC 91 07/14/2020   TRIG 78 07/14/2020   Lab Results  Component Value Date   INR 1.8 (H) 02/02/2021   INR 2.3 (H) 02/01/2021   INR 1.7 (H) 01/30/2021    Lab Results  Component Value Date   HGBA1C 8.1 (H) 01/31/2021    Radiology Studies    DG CHEST PORT 1 VIEW  Result Date: 02/01/2021 CLINICAL DATA:  Cough EXAM: PORTABLE CHEST 1 VIEW COMPARISON:  01/31/2021 FINDINGS: Cardiomegaly, vascular congestion. Perihilar opacities and interstitial prominence, likely edema. No effusions. No acute bony abnormality. IMPRESSION: Cardiomegaly with vascular congestion and probable mild interstitial edema. Electronically Signed   By: Rolm Baptise M.D.   On: 02/01/2021 20:24   DG Chest Port 1 View  Result Date: 01/31/2021 CLINICAL DATA:  Hypoxia. EXAM: PORTABLE  CHEST 1 VIEW COMPARISON:  01/30/2021. FINDINGS: Endotracheal tube and NG tube in stable position. Stable cardiomegaly. Multifocal bilateral pulmonary infiltrates/edema again noted without interim change. Atelectatic changes right upper lobe. Small bilateral pleural effusions. No pneumothorax. IMPRESSION: 1.  Lines and tubes in stable position. 2.  Stable cardiomegaly. 3. Multifocal bilateral pulmonary infiltrates/edema again noted without interim change. Atelectatic changes right upper lobe. Small bilateral pleural effusions. Electronically Signed   By: Marcello Moores  Register   On: 01/31/2021 06:05   DG Chest Portable 1 View  Result Date: 01/30/2021 CLINICAL DATA:  Intubation. EXAM: PORTABLE CHEST 1 VIEW COMPARISON:  Chest x-ray 11/10/2020 FINDINGS: The endotracheal tube is 5 cm above the carina. The NG tube is coursing down the esophagus and into the stomach. External pacer paddles are noted. Bilateral airspace opacities most significant in the right upper lobe suggesting multifocal pneumonia. No pleural effusions or pneumothorax. IMPRESSION: 1. Endotracheal tube and NG tubes in good position. 2. Bilateral airspace opacities suggesting multifocal pneumonia. Electronically Signed   By: Marijo Sanes M.D.   On: 01/30/2021 20:23   ECG & Cardiac Imaging    4/20 @ 0031: Regular sinus rhythm, 76, left axis deviation, IVCD, prior anterior MI, anterolateral T wave inversion.  Anterolateral T wave inversion is new compared ECGs earlier in admission- personally reviewed.  Assessment & Plan    1.  Acute pulmonary edema/non-STEMI/coronary artery disease: Patient with a history of CAD  dating back to 2006 with stenting of the RCA and LAD at that time and subsequent repeat stenting of the RCA in 2009 and again in October 2021 in the setting of in-stent restenosis and inferior STEMI.  She has been on warfarin and clopidogrel since then and had been doing well until the evening of April 19, when she developed severe dyspnea  eventually requiring intubation and admission.  Chest x-ray on admission showed pulmonary edema.  Initial troponin was 23 with subsequent rise to 64 and then 416.  Although her initial ECG did not show acute ischemic changes, ECG just after midnight on April 20 did show deep anterolateral T wave inversion.  Echocardiogram this admission shows slight worsening of EF to 40-45% with basal-septal LVH and grade 2 diastolic dysfunction.  She denies any chest pain prior to or since admission.  She did have recurrent episode of dyspnea on the evening of April 21 with chest x-ray again showing pulmonary edema requiring additional diuresis.  She is currently off of oxygen but still has crackles on exam, JVD, and some abdominal distention.  We will add back Lasix 40 mg IV twice daily with close monitoring of renal function.  I am concerned that her acute pulmonary edema is an anginal equivalent and we will plan diagnostic catheterization on Monday.  The patient understands that risks include but are not limited to stroke (1 in 1000), death (1 in 70), kidney failure [usually temporary] (1 in 500), bleeding (1 in 200), allergic reaction [possibly serious] (1 in 200), and agrees to proceed.  Continue beta-blocker, statin, and clopidogrel.  We will change warfarin to heparin for the weekend.  INR is 1.8 earlier today.  2.  Acute on chronic heart failure with midrangT2 inhibitor candidate ejection fraction/ischemic cardiomyopathy: EF previously 45 to 50% by echo in October 2021 and now 40 to 45% with global hypokinesis by echo this admission.  She remains volume overloaded.  As above, adding Lasix 40 g IV twice daily with close monitoring of renal function.  Continue beta-blocker therapy.  She is not on an ACE/ARB/ARNI/MRA in the setting of stage III chronic kidney disease.  Blood pressures initially trended low following mission but has since been in the 120s to 160s.  Continue to follow diuresis with low threshold to add  BiDil.  3.  Essential hypertension: As above, pressures were initially soft on admission but have since been trending in the 120s to 150s.  Given variability and ongoing diuresis, will continue to follow.  Continue beta-blocker therapy with low threshold to add hydralazine/nitrate.  4.  Hyperlipidemia: LDL of 91 in October 2021.  She is currently on atorvastatin 20 mg daily and I will escalate this given recurrent ACS.  5.  Type 2 diabetes mellitus: A1c 8.1.  Insulin management per internal medicine.  With chronic kidney disease, likely not an ideal SGLe.  6.  Mild to moderate aortic stenosis: Felt to be more moderate on echo this admission.  Overall stable.  7.  Stage III chronic kidney disease: Relatively stable.  Continue to follow with diuresis.  Avoiding nephrotoxic agents in preparation for a diagnostic catheterization on Monday.  8.  Normocytic anemia: H&H down since admission.  Was 13 and 41.5 on arrival and now 9.7/30.2.  She is chronically anticoagulated.  No evidence of bleeding.  Follow closely.  9.  History of bilateral PE: On chronic warfarin at home.  This was initially held on admission.  INR was 1.8 this morning.  I will hold  warfarin and resume heparin in preparation for catheterization on Monday.  Signed, Murray Hodgkins, NP 02/02/2021, 6:22 PM  For questions or updates, please contact   Please consult [email or web address] for contact info under Cardiology/STEMI.  History and all data above reviewed.  Patient examined.  I agree with the findings as above.  The patient presents with acute SOB.  She said she was at home and had been feeling OK.  Her symptoms occurred suddenly.  She required intubation for acute respiratory failure.  She does not report any arrhythmias or chest pain.  She said she had been OK.  She gets around her house with a walker.   She had a recurrent increased SOB with edema on exam and CXR yesterday.  In addition she was noted to have a significant change in  her EKG with deep T wave inversion across the anterior precordium and QT prolongation.  Trop has been mildly elevated.  Again she denied chest pain.  The patient exam reveals COR:RRR, systolic murmur radiating out the outflow tract  ,  Lungs: Bilateral crackles  ,  Abd: Tense but no tenderness or guarding, Ext No edema  .  All available labs, radiology testing, previous records reviewed. Agree with documented assessment and plan.   Acute pulmonary edema:  Suspect ischemia mediated.  I reviewed the films from last fall and the echo this admission.  I do not suspect that the AS is the culprit although the gradient appears to be increased from previous.  She had severe diffuse three vessel CAD on cath.  She would agree to cath on Monday.  There is a mention of memory problems and we did call her daughter.  She might consent to surgery if indicated by right and left cath results.  We will schedule her Lasix understanding that she has CKD that we will need to watch closely.    Jeneen Rinks Marciel Offenberger  7:07 PM  02/02/2021

## 2021-02-02 NOTE — Progress Notes (Addendum)
Patient may have vascular dementia per daughter.  Forgets discharge instructions often and ends up back inpatient.  Had lots of questions regarding utility of sleep study.  May be interested in a more comfort based approach.  Family is going to discuss and would like joint sit down before discharge to go over next steps.    Will get [facility] consult and inform hospitalist.  21 minutes advance care planning  Erskine Emery MD PCCM

## 2021-02-02 NOTE — Progress Notes (Signed)
Pt noted to have coarse bilateral lung sounds with productive cough. Pt denies SOB and is 93%RA. Sputum noted to be thick and blood tinged. Will send sputum culture as ordered. Pt also noted to have blood tinged mucus when blowing her nose. Pt family also informed RN that pt has a pressure injury to the sacral area from her last hospital admission that is not getting better. Area assessed and a stage 1 pressure injury was noted to the coccyx as well as a skin tear to the left forearm. Foam dressings applied and wound care consult ordered. MD aware of secretions.

## 2021-02-02 NOTE — Progress Notes (Addendum)
PROGRESS NOTE    [name]  [id number] DOB: 08-Aug-1941 DOA: 01/30/2021 PCP: Celene Squibb, MD   Chief Complaint  Patient presents with  . Shortness of Breath  . Code STEMI   Brief Narrative: 79 yo female with pmh afib on chronic coumadin, h/o pe's, h/o cva, hyperlipidemia, HTN, cad s/p DES to RCA 2006/2009/2021, HFpEF, dm2 who presented with progressive sob and hypoxic resp failure.  Per discussion with daughter, she notes SOB was sudden onset, no gradual progression.  On presentation, she was satting 78% and placed on NIV, but became unresponsive.  She was intubated for airway protection.  She's been diuresed over the past several days and has gradually improved.  She was transferred to [facility] on 4/22.   Assessment & Plan:   Active Problems:   Acute on chronic heart failure (HCC)  Acute Hypoxic Respiratory Failure secondary to Acute Systolic and Diastolic Heart Failure Exacerbations Respiratory status improving - on RA today Continue lasix as tolerated, additional dose IV given today with bibasilar crackles (she's not able to tell me what dose of lasix she's on at home - will talk to daughter) Home coreg currently on hold, will review meds with daughter, restart today Not currently on arb/entresto, will follow  Will discuss with cardiology as well Strict I/O, daily weights Echo with EF 40-45%, global hypokinesis, grade 2 diastolic dysfunction, mild to moderate aortic stenosis (see below)  Elevated Troponin  Hx CAD s/p DES In the setting of above, trop rising at presentation, continuing to rise today She's asymptomatic Continue plavix EKG at presentation with LBBB, T wave inversions noted in I, aVL, V2-V6 on 4/20. Will trend.  Follow EKG.  Consult cardiology.  T2DM Hold glipizide Continue SSI for now Follow  Hx Atrial Fibrillation Warfarin per pharmacy  Hx PE Warfarin as noted above  CKD 3b Fluctuating, follow with diuresis  HLD Statin  GERD PPI Concern  for OSA Needs sleep study outpatient   DVT prophylaxis: warfarin Code Status: partial - no CPR, intubation ok Family Communication: none at bedside Disposition:   Status is: Inpatient  Remains inpatient appropriate because:Inpatient level of care appropriate due to severity of illness   Dispo: The patient is from: Home              Anticipated d/c is to: Home              Patient currently is not medically stable to d/c.   Difficult to place patient No       Consultants:   PCCM  Procedures:  Echo IMPRESSIONS    1. Left ventricular ejection fraction, by estimation, is 40 to 45%. Left  ventricular ejection fraction by 3D volume is 42 %. The left ventricle has  mildly decreased function. The left ventricle demonstrates global  hypokinesis. The left ventricular  internal cavity size was severely dilated. There is severe left  ventricular hypertrophy of the basal-septal segment. Left ventricular  diastolic parameters are consistent with Grade II diastolic dysfunction  (pseudonormalization). Elevated left ventricular  end-diastolic pressure. The average left ventricular global longitudinal  strain is -9.1 %. The global longitudinal strain is abnormal.  2. Right ventricular systolic function is normal. The right ventricular  size is normal. There is mildly elevated pulmonary artery systolic  pressure. The estimated right ventricular systolic pressure is [id number] mmHg.  3. Left atrial size was severely dilated.  4. The mitral valve is degenerative. Mild mitral valve regurgitation.  Severe mitral annular calcification.The posterior MV leaflet  appears fixed  and immobile.  5. The aortic valve is calcified. There is severe calcifcation of the  aortic valve. There is severe thickening of the aortic valve. Aortic valve  regurgitation is not visualized. Mild to moderate aortic valve stenosis.  Aortic valve mean gradient measures  11.0 mmHg. Aortic valve Vmax measures 2.16  m/s. In the setting of LV  dysfunction the AV gradient may be underestimated and likely is more on  the moderate AS side.  6. The inferior vena cava is normal in size with greater than 50%  respiratory variability, suggesting right atrial pressure of 3  mmHg.Compared to prior echo 2021, LVF appears similar, AV mean gradient  with no signfiicant change.   Antimicrobials:  Anti-infectives (From admission, onward)   None        Subjective: No complaints  Isn't able to tell me much with regards to meds  Objective: Vitals:   02/02/21 0500 02/02/21 0538 02/02/21 0714 02/02/21 1054  BP:  (!) 160/65 123/72 (!) 145/79  Pulse:   81 80  Resp:   17 18  Temp:  99.2 F (37.3 C) 98.4 F (36.9 C) 98 F (36.7 C)  TempSrc:  Oral    SpO2:  96% 96% 94%  Weight: 88 kg     Height:        Intake/Output Summary (Last 24 hours) at 02/02/2021 1520 Last data filed at 02/02/2021 1408 Gross per 24 hour  Intake 660 ml  Output 1575 ml  Net -915 ml   Filed Weights   02/01/21 0500 02/01/21 1220 02/02/21 0500  Weight: 87.6 kg 88.1 kg 88 kg    Examination:  General exam: Appears calm and comfortable  Respiratory system: bibasilar crackles Cardiovascular system: S1 & S2 heard, RRR.  Gastrointestinal system: Abdomen is nondistended, soft and nontender. Central nervous system: Alert and oriented. No focal neurological deficits. Extremities: no LEE Skin: No rashes, lesions or ulcers Psychiatry: Judgement and insight appear normal. Mood & affect appropriate.     Data Reviewed: I have personally reviewed following labs and imaging studies  CBC: Recent Labs  Lab 01/30/21 2010 01/30/21 2044 01/30/21 2131 01/31/21 0222 02/01/21 0415 02/02/21 0425  WBC 7.5  --  7.2 7.5 7.5 4.7  NEUTROABS 2.6  --   --   --   --   --   HGB 13.0 12.2 12.1 11.6* 9.9* 9.7*  HCT 41.5 36.0 38.8 36.7 30.5* 30.2*  MCV 92.4  --  93.3 90.8 89.4 89.3  PLT 365  --  318 301 262 [id number]    Basic Metabolic  Panel: Recent Labs  Lab 01/30/21 1957 01/30/21 2010 01/30/21 2044 01/30/21 2131 01/31/21 0222 02/01/21 0415 02/02/21 0425  NA 135 134* 133*  --  134* 137 140  K 4.7 4.6 4.5  --  5.4* 3.7 3.5  CL 98 96*  --   --  99 100 101  CO2  --  29  --   --  27 29 32  GLUCOSE 314* 302*  --   --  287* 71 92  BUN 41* 32*  --   --  34* 40* 37*  CREATININE 1.50* 1.65*  --  1.53* 1.60* 1.89* 1.64*  CALCIUM  --  8.9  --   --  8.4* 8.5* 8.6*  MG  --   --   --   --  2.2  --   --   PHOS  --   --   --   --  4.4  --   --  GFR: Estimated Creatinine Clearance: 30.6 mL/min (Alisea Matte) (by C-G formula based on SCr of 1.64 mg/dL (H)).  Liver Function Tests: Recent Labs  Lab 01/30/21 2010  AST 29  ALT 20  ALKPHOS 93  BILITOT 0.6  PROT 7.7  ALBUMIN 3.6    CBG: Recent Labs  Lab 02/01/21 1106 02/01/21 1622 02/01/21 2051 02/02/21 0549 02/02/21 1054  GLUCAP 135* 239* 206* 94 182*     Recent Results (from the past 240 hour(s))  Resp Panel by RT-PCR (Flu Rahm Minix&B, Covid) Nasopharyngeal Swab     Status: None   Collection Time: 01/30/21  7:46 PM   Specimen: Nasopharyngeal Swab; Nasopharyngeal(NP) swabs in vial transport medium  Result Value Ref Range Status   SARS Coronavirus 2 by RT PCR NEGATIVE NEGATIVE Final    Comment: (NOTE) SARS-CoV-2 target nucleic acids are NOT DETECTED.  The SARS-CoV-2 RNA is generally detectable in upper respiratory specimens during the acute phase of infection. The lowest concentration of SARS-CoV-2 viral copies this assay can detect is 138 copies/mL. Dietra Stokely negative result does not preclude SARS-Cov-2 infection and should not be used as the sole basis for treatment or other patient management decisions. Halley Shepheard negative result may occur with  improper specimen collection/handling, submission of specimen other than nasopharyngeal swab, presence of viral mutation(s) within the areas targeted by this assay, and inadequate number of viral copies(<138 copies/mL). Hilliary Jock negative result must  be combined with clinical observations, patient history, and epidemiological information. The expected result is Negative.  Fact Sheet for Patients:  EntrepreneurPulse.com.au  Fact Sheet for Healthcare Providers:  IncredibleEmployment.be  This test is no t yet approved or cleared by the [place] FDA and  has been authorized for detection and/or diagnosis of SARS-CoV-2 by FDA under an Emergency Use Authorization (EUA). This EUA will remain  in effect (meaning this test can be used) for the duration of the COVID-19 declaration under Section 564(b)(1) of the Act, 21 U.S.C.section 360bbb-3(b)(1), unless the authorization is terminated  or revoked sooner.       Influenza Marcee Jacobs by PCR NEGATIVE NEGATIVE Final   Influenza B by PCR NEGATIVE NEGATIVE Final    Comment: (NOTE) The Xpert Xpress SARS-CoV-2/FLU/RSV plus assay is intended as an aid in the diagnosis of influenza from Nasopharyngeal swab specimens and should not be used as Willamina Grieshop sole basis for treatment. Nasal washings and aspirates are unacceptable for Xpert Xpress SARS-CoV-2/FLU/RSV testing.  Fact Sheet for Patients: EntrepreneurPulse.com.au  Fact Sheet for Healthcare Providers: IncredibleEmployment.be  This test is not yet approved or cleared by the [place] FDA and has been authorized for detection and/or diagnosis of SARS-CoV-2 by FDA under an Emergency Use Authorization (EUA). This EUA will remain in effect (meaning this test can be used) for the duration of the COVID-19 declaration under Section 564(b)(1) of the Act, 21 U.S.C. section 360bbb-3(b)(1), unless the authorization is terminated or revoked.  Performed at [facility], [place] [address] [place], [place] [address]   Culture, blood (routine x 2)     Status: None (Preliminary result)   Collection Time: 01/30/21  9:45 PM   Specimen: BLOOD RIGHT HAND  Result Value Ref Range Status    Specimen Description BLOOD RIGHT HAND  Final   Special Requests   Final    AEROBIC BOTTLE ONLY Blood Culture results may not be optimal due to an inadequate volume of blood received in culture bottles   Culture   Final    NO GROWTH 2 DAYS Performed at [facility], [address] [place], [place] [address]    Report Status PENDING  Incomplete  Culture, blood (routine x 2)     Status: Abnormal   Collection Time: 01/30/21  9:50 PM   Specimen: BLOOD RIGHT HAND  Result Value Ref Range Status   Specimen Description BLOOD RIGHT HAND  Final   Special Requests AEROBIC BOTTLE ONLY Blood Culture adequate volume  Final   Culture  Setup Time   Final    AEROBIC BOTTLE ONLY GRAM POSITIVE COCCI CRITICAL RESULT CALLED TO, READ BACK BY AND VERIFIED WITH: J LEDFORD PHARMD 02/01/21 0238 JDW    Culture (Rickeya Manus)  Final    STAPHYLOCOCCUS EPIDERMIDIS THE SIGNIFICANCE OF ISOLATING THIS ORGANISM FROM [place] SINGLE SET OF BLOOD CULTURES WHEN MULTIPLE SETS ARE DRAWN IS UNCERTAIN. PLEASE NOTIFY THE MICROBIOLOGY DEPARTMENT WITHIN ONE WEEK IF SPECIATION AND SENSITIVITIES ARE REQUIRED. Performed at [facility], [place] [address], [place], [place] [address]    Report Status 02/02/2021 FINAL  Final  Blood Culture ID Panel (Reflexed)     Status: Abnormal   Collection Time: 01/30/21  9:50 PM  Result Value Ref Range Status   Enterococcus faecalis NOT DETECTED NOT DETECTED Final   Enterococcus Faecium NOT DETECTED NOT DETECTED Final   Listeria monocytogenes NOT DETECTED NOT DETECTED Final   Staphylococcus species DETECTED (Yarelis Ambrosino) NOT DETECTED Final    Comment: CRITICAL RESULT CALLED TO, READ BACK BY AND VERIFIED WITH: J LEDFORD PHARMD 02/01/21 0238 JDW    Staphylococcus aureus (BCID) NOT DETECTED NOT DETECTED Final   Staphylococcus epidermidis DETECTED (Demonta Wombles) NOT DETECTED Final    Comment: Methicillin (oxacillin) resistant coagulase negative staphylococcus. Possible blood culture contaminant (unless isolated from more than  one blood culture draw or clinical case suggests pathogenicity). No antibiotic treatment is indicated for blood  culture contaminants. CRITICAL RESULT CALLED TO, READ BACK BY AND VERIFIED WITH: J LEDFORD [facility] 02/01/21 0238 JDW    Staphylococcus lugdunensis NOT DETECTED NOT DETECTED Final   Streptococcus species NOT DETECTED NOT DETECTED Final   Streptococcus agalactiae NOT DETECTED NOT DETECTED Final   Streptococcus pneumoniae NOT DETECTED NOT DETECTED Final   Streptococcus pyogenes NOT DETECTED NOT DETECTED Final   Pierre Cumpton.calcoaceticus-baumannii NOT DETECTED NOT DETECTED Final   Bacteroides fragilis NOT DETECTED NOT DETECTED Final   Enterobacterales NOT DETECTED NOT DETECTED Final   Enterobacter cloacae complex NOT DETECTED NOT DETECTED Final   Escherichia coli NOT DETECTED NOT DETECTED Final   Klebsiella aerogenes NOT DETECTED NOT DETECTED Final   Klebsiella oxytoca NOT DETECTED NOT DETECTED Final   Klebsiella pneumoniae NOT DETECTED NOT DETECTED Final   Proteus species NOT DETECTED NOT DETECTED Final   Salmonella species NOT DETECTED NOT DETECTED Final   Serratia marcescens NOT DETECTED NOT DETECTED Final   Haemophilus influenzae NOT DETECTED NOT DETECTED Final   Neisseria meningitidis NOT DETECTED NOT DETECTED Final   Pseudomonas aeruginosa NOT DETECTED NOT DETECTED Final   Stenotrophomonas maltophilia NOT DETECTED NOT DETECTED Final   Candida albicans NOT DETECTED NOT DETECTED Final   Candida auris NOT DETECTED NOT DETECTED Final   Candida glabrata NOT DETECTED NOT DETECTED Final   Candida krusei NOT DETECTED NOT DETECTED Final   Candida parapsilosis NOT DETECTED NOT DETECTED Final   Candida tropicalis NOT DETECTED NOT DETECTED Final   Cryptococcus neoformans/gattii NOT DETECTED NOT DETECTED Final   Methicillin resistance mecA/C DETECTED (Shinika Estelle) NOT DETECTED Final    Comment: CRITICAL RESULT CALLED TO, READ BACK BY AND VERIFIED WITH: J [facility] PHARMD 02/01/21  0238 JDW Performed at  [facility], [place] [address] [place], [place] [address]   MRSA PCR Screening     Status: None   Collection Time: 01/31/21 12:36 AM   Specimen: Nasal Mucosa; Nasopharyngeal  Result Value Ref Range Status   MRSA by PCR NEGATIVE NEGATIVE Final    Comment:        The GeneXpert MRSA Assay (FDA approved for NASAL specimens only), is one component of Thais Silberstein comprehensive MRSA colonization surveillance program. It is not intended to diagnose MRSA infection nor to guide or monitor treatment for MRSA infections. Performed at [facility], [place] [address], [place], [place] [address]          Radiology Studies: DG CHEST PORT 1 VIEW  Result Date: 02/01/2021 CLINICAL DATA:  Cough EXAM: PORTABLE CHEST 1 VIEW COMPARISON:  01/31/2021 FINDINGS: Cardiomegaly, vascular congestion. Perihilar opacities and interstitial prominence, likely edema. No effusions. No acute bony abnormality. IMPRESSION: Cardiomegaly with vascular congestion and probable mild interstitial edema. Electronically Signed   By: Rolm Baptise M.D.   On: 02/01/2021 20:24        Scheduled Meds: . atorvastatin  20 mg Oral Daily  . chlorhexidine  15 mL Mouth Rinse BID  . Chlorhexidine Gluconate Cloth  6 each Topical Daily  . clopidogrel  75 mg Oral Daily  . docusate sodium  100 mg Oral BID  . glipiZIDE  5 mg Oral QPM  . insulin aspart  0-5 Units Subcutaneous QHS  . insulin aspart  0-9 Units Subcutaneous TID WC  . mouth rinse  15 mL Mouth Rinse q12n4p  . pantoprazole (PROTONIX) IV  40 mg Intravenous QHS  . polyethylene glycol  17 g Oral Daily  . warfarin  7.5 mg Oral ONCE-1600  . Warfarin - Pharmacist Dosing Inpatient   Does not apply q1600   Continuous Infusions: . sodium chloride       LOS: 3 days    Time spent: over 30 min    Fayrene Helper, MD [facility]   To contact the attending provider between 7A-7P or the covering provider during after hours 7P-7A, please log into the web site  [email or web address] and access using universal [place] password for that web site. If you do not have the password, please call the hospital operator.  02/02/2021, 3:20 PM

## 2021-02-02 NOTE — Progress Notes (Addendum)
ANTICOAGULATION CONSULT NOTE - Follow Up Consult  Pharmacy Consult for Warfarin Indication: atrial flutter and hx PE 2009  No Known Allergies  Patient Measurements: Height: '5\' 6"'$  (167.6 cm) Weight: 88 kg (194 lb 0.1 oz) IBW/kg (Calculated) : 59.3  Vital Signs: Temp: 98 F (36.7 C) (04/22 1054) Temp Source: Oral (04/22 0538) BP: 145/79 (04/22 1054) Pulse Rate: 80 (04/22 1054)  Labs: Recent Labs    01/30/21 2010 01/30/21 2044 01/30/21 2202 01/31/21 0222 01/31/21 0932 01/31/21 1852 02/01/21 0415 02/02/21 0425  HGB 13.0   < >  --  11.6*  --   --  9.9* 9.7*  HCT 41.5   < >  --  36.7  --   --  30.5* 30.2*  PLT 365   < >  --  301  --   --  262 262  LABPROT 19.8*  --   --   --   --   --  24.8* 20.9*  INR 1.7*  --   --   --   --   --  2.3* 1.8*  HEPARINUNFRC  --   --   --   --  0.16* 0.23* 0.46  --   CREATININE 1.65*   < >  --  1.60*  --   --  1.89* 1.64*  TROPONINIHS 23*  --  64*  --   --   --   --   --    < > = values in this interval not displayed.    Estimated Creatinine Clearance: 30.6 mL/min (A) (by C-G formula based on SCr of 1.64 mg/dL (H)).  Assessment: 75 YOF presented 01/30/21 with SOB, hx of PE and aflutter on warfarin PTA.  INR subtherapeutic on admission at 1.7 and heparin drip begun while holding warfarin for possible procedures.  INR up to 2.3 on 4/21 and no procedures planned, so IV heparin was stopped and warfarin resumed with usual dose of 5 mg.  INR 1.8 today, subtherapeutic. Also on Plavix as PTA for hx multiple stents.   PTA warfarin regimen: 5 mg daily.  Last outpatient dose 4/19; warfarin was held on 01/31/21.   Goal of Therapy:  INR 2-3 Monitor platelets by anticoagulation protocol: Yes   Plan:  Warfarin 7.5 mg x 1 today. Daily PT/INR.  Arty Baumgartner, RPh 02/02/2021,11:10 AM

## 2021-02-02 NOTE — Progress Notes (Addendum)
ANTICOAGULATION CONSULT NOTE - Follow Up Consult  Pharmacy Consult for heparin/warfarin Indication: atrial flutter and hx PE 2009  No Known Allergies  Patient Measurements: Height: '5\' 6"'$  (167.6 cm) Weight: 88 kg (194 lb 0.1 oz) IBW/kg (Calculated) : 59.3  Vital Signs: Temp: 98 F (36.7 C) (04/22 1054) BP: 145/79 (04/22 1054) Pulse Rate: 80 (04/22 1054)  Labs: Recent Labs    01/30/21 2010 01/30/21 2044 01/30/21 2202 01/31/21 0222 01/31/21 0932 01/31/21 1852 02/01/21 0415 02/02/21 0425 02/02/21 1142  HGB 13.0   < >  --  11.6*  --   --  9.9* 9.7*  --   HCT 41.5   < >  --  36.7  --   --  30.5* 30.2*  --   PLT 365   < >  --  301  --   --  262 262  --   LABPROT 19.8*  --   --   --   --   --  24.8* 20.9*  --   INR 1.7*  --   --   --   --   --  2.3* 1.8*  --   HEPARINUNFRC  --   --   --   --  0.16* 0.23* 0.46  --   --   CREATININE 1.65*   < >  --  1.60*  --   --  1.89* 1.64*  --   TROPONINIHS 23*  --  64*  --   --   --   --   --  416*   < > = values in this interval not displayed.    Estimated Creatinine Clearance: 30.6 mL/min (A) (by C-G formula based on SCr of 1.64 mg/dL (H)).  Assessment: 74 YOF presented 01/30/21 with SOB, hx of PE and aflutter on warfarin PTA. INR subtherapeutic on admission at 1.7 and heparin drip begun while holding warfarin for possible procedures. INR up to 2.3 on 4/21 and no procedures planned, so IV heparin was stopped and warfarin resumed with usual dose of 5 mg. INR 1.8 today, subtherapeutic. Also on Plavix as PTA for hx multiple stents.  4/22 PM update - Cardiology planning cath on Monday for concern for ACS. Pharmacy consulted to transition from warfarin to heparin drip over the weekend while awaiting procedure. INR 1.8 this AM. CBC stable. No active bleed issues reported. Patient received warfarin dose earlier this evening.  PTA warfarin regimen: 5 mg daily. Last outpatient dose 4/19; warfarin was held on 01/31/21.   Goal of Therapy:  Heparin  level 0.3-0.7 units/ml Monitor platelets by anticoagulation protocol: Yes   Plan:  Hold warfarin over weekend per Cards Resume heparin at previous therapeutic rate 1350 units/hr Check 8hr heparin level Monitor daily CBC, s/sx bleeding Cath planned for Monday   Arturo Morton, PharmD, BCPS Please check AMION for all [place] contact numbers Clinical Pharmacist 02/02/2021 6:40 PM

## 2021-02-02 NOTE — Care Management Important Message (Signed)
Important Message  Patient Details  Name: [name] MRN: [id number] Date of Birth: Dec 06, 1940   Medicare Important Message Given:  Yes     Shelda Altes 02/02/2021, 10:12 AM

## 2021-02-03 LAB — COMPREHENSIVE METABOLIC PANEL
ALT: 15 U/L (ref 0–44)
AST: 21 U/L (ref 15–41)
Albumin: 2.8 g/dL — ABNORMAL LOW (ref 3.5–5.0)
Alkaline Phosphatase: 65 U/L (ref 38–126)
Anion gap: 8 (ref 5–15)
BUN: 38 mg/dL — ABNORMAL HIGH (ref 8–23)
CO2: 33 mmol/L — ABNORMAL HIGH (ref 22–32)
Calcium: 8.7 mg/dL — ABNORMAL LOW (ref 8.9–10.3)
Chloride: 99 mmol/L (ref 98–111)
Creatinine, Ser: 1.68 mg/dL — ABNORMAL HIGH (ref 0.44–1.00)
GFR, Estimated: 31 mL/min — ABNORMAL LOW (ref >60)
Glucose, Bld: 100 mg/dL — ABNORMAL HIGH (ref 70–99)
Potassium: 3.6 mmol/L (ref 3.5–5.1)
Sodium: 140 mmol/L (ref 135–145)
Total Bilirubin: 1.2 mg/dL (ref 0.3–1.2)
Total Protein: 5.9 g/dL — ABNORMAL LOW (ref 6.5–8.1)

## 2021-02-03 LAB — CBC WITH DIFFERENTIAL/PLATELET
Abs Immature Granulocytes: 0.01 10*3/uL (ref 0.00–0.07)
Basophils Absolute: 0.1 10*3/uL (ref 0.0–0.1)
Basophils Relative: 1 %
Eosinophils Absolute: 0.1 10*3/uL (ref 0.0–0.5)
Eosinophils Relative: 1 %
HCT: 31.6 % — ABNORMAL LOW (ref 36.0–46.0)
Hemoglobin: 10.1 g/dL — ABNORMAL LOW (ref 12.0–15.0)
Immature Granulocytes: 0 %
Lymphocytes Relative: 43 %
Lymphs Abs: 2.1 10*3/uL (ref 0.7–4.0)
MCH: 28.4 pg (ref 26.0–34.0)
MCHC: 32 g/dL (ref 30.0–36.0)
MCV: 88.8 fL (ref 80.0–100.0)
Monocytes Absolute: 0.8 10*3/uL (ref 0.1–1.0)
Monocytes Relative: 16 %
Neutro Abs: 2 10*3/uL (ref 1.7–7.7)
Neutrophils Relative %: 39 %
Platelets: 278 10*3/uL (ref 150–400)
RBC: 3.56 MIL/uL — ABNORMAL LOW (ref 3.87–5.11)
RDW: 14 % (ref 11.5–15.5)
WBC: 5.1 10*3/uL (ref 4.0–10.5)
nRBC: 0 % (ref 0.0–0.2)

## 2021-02-03 LAB — EXPECTORATED SPUTUM ASSESSMENT W GRAM STAIN, RFLX TO RESP C

## 2021-02-03 LAB — GLUCOSE, CAPILLARY
Glucose-Capillary: 122 mg/dL — ABNORMAL HIGH (ref 70–99)
Glucose-Capillary: 128 mg/dL — ABNORMAL HIGH (ref 70–99)
Glucose-Capillary: 190 mg/dL — ABNORMAL HIGH (ref 70–99)
Glucose-Capillary: 196 mg/dL — ABNORMAL HIGH (ref 70–99)
Glucose-Capillary: 257 mg/dL — ABNORMAL HIGH (ref 70–99)

## 2021-02-03 LAB — PHOSPHORUS: Phosphorus: 3.7 mg/dL (ref 2.5–4.6)

## 2021-02-03 LAB — HEPARIN LEVEL (UNFRACTIONATED)
Heparin Unfractionated: 0.3 IU/mL (ref 0.30–0.70)
Heparin Unfractionated: 0.52 IU/mL (ref 0.30–0.70)

## 2021-02-03 LAB — PROTIME-INR
INR: 1.9 — ABNORMAL HIGH (ref 0.8–1.2)
Prothrombin Time: 21.6 seconds — ABNORMAL HIGH (ref 11.4–15.2)

## 2021-02-03 LAB — MAGNESIUM: Magnesium: 2 mg/dL (ref 1.7–2.4)

## 2021-02-03 NOTE — Plan of Care (Signed)
  Problem: Education: Goal: Knowledge of General Education information will improve Description: Including pain rating scale, medication(s)/side effects and non-pharmacologic comfort measures Outcome: Progressing   Problem: Health Behavior/Discharge Planning: Goal: Ability to manage health-related needs will improve Outcome: Progressing   Problem: Clinical Measurements: Goal: Ability to maintain clinical measurements within normal limits will improve Outcome: Progressing Goal: Will remain free from infection Outcome: Progressing Goal: Diagnostic test results will improve Outcome: Progressing Goal: Respiratory complications will improve Outcome: Progressing Goal: Cardiovascular complication will be avoided Outcome: Progressing   Problem: Activity: Goal: Risk for activity intolerance will decrease Outcome: Progressing   Problem: Nutrition: Goal: Adequate nutrition will be maintained Outcome: Progressing   Problem: Coping: Goal: Level of anxiety will decrease Outcome: Progressing   Problem: Elimination: Goal: Will not experience complications related to bowel motility Outcome: Progressing Goal: Will not experience complications related to urinary retention Outcome: Progressing   Problem: Pain Managment: Goal: General experience of comfort will improve Outcome: Progressing   Problem: Safety: Goal: Ability to remain free from injury will improve Outcome: Progressing   Problem: Skin Integrity: Goal: Risk for impaired skin integrity will decrease Outcome: Progressing   Problem: Education: Goal: Ability to demonstrate management of disease process will improve Outcome: Progressing Goal: Ability to verbalize understanding of medication therapies will improve Outcome: Progressing Goal: Individualized Educational Video(s) Outcome: Progressing   Problem: Activity: Goal: Capacity to carry out activities will improve Outcome: Progressing   Problem: Cardiac: Goal:  Ability to achieve and maintain adequate cardiopulmonary perfusion will improve Outcome: Progressing   Problem: Cardiac: Goal: Ability to achieve and maintain adequate cardiopulmonary perfusion will improve Outcome: Progressing

## 2021-02-03 NOTE — Progress Notes (Signed)
Occupational Therapy Treatment Patient Details Name: [name] MRN: [id number] DOB: 14-Jul-1941 Today's Date: 02/03/2021    History of present illness 80 yo admitted 4/19 with respiratory failure with pulmonary edema and heart failure, intubated 4/19-4/20. PMHx: Afib, CVA, HLD, HTN, CAD, HFpEF, DM   OT comments  Patient with continued progress toward patient focused OT goals.  Patient on RA this date and saturating 97% on RA.  She is a little apprehensive about her procedure Monday, but is in good spirits otherwise.  She is able to transfer to and from the bathroom at a Mod I level with a RW, and was able to change out her socks this date with setup only. OT to follow up in the acute setting after her cardiac catheterization procedure, and if she remains stable, okay to discontinue.  She plans on  Returning home with prior supports, and the daughter is considering life alert for home.     Follow Up Recommendations  No OT follow up    Equipment Recommendations    2WRW   Recommendations for Other Services      Precautions / Restrictions Precautions Precautions: Fall Restrictions Weight Bearing Restrictions: No       Mobility Bed Mobility Overal bed mobility: Independent                  Transfers Overall transfer level: Needs assistance Equipment used: Rolling walker (2 wheeled)                  Balance Overall balance assessment: Needs assistance Sitting-balance support: Feet supported Sitting balance-Leahy Scale: Good     Standing balance support: Bilateral upper extremity supported Standing balance-Leahy Scale: Fair                             ADL either performed or assessed with clinical judgement   ADL       Grooming: Wash/dry hands;Oral care;Set up;Standing           Upper Body Dressing : Set up;Sitting   Lower Body Dressing: Supervision/safety;Sit to/from stand Lower Body Dressing Details (indicate cue type and reason):  able to place sock this session. Toilet Transfer: RW;Modified Dentist and Hygiene: Modified independent;Sit to/from stand       Functional mobility during ADLs: Modified independent;Rolling walker                         Cognition Arousal/Alertness: Awake/alert Behavior During Therapy: WFL for tasks assessed/performed Overall Cognitive Status: History of cognitive impairments - at baseline                                 General Comments: ST memory primarily affected.  Decreased safety at times.                          Pertinent Vitals/ Pain       Pain Assessment: No/denies pain Pain Intervention(s): Monitored during session                                                          Frequency  Min 2X/week  Progress Toward Goals  OT Goals(current goals can now be found in the care plan section)  Progress towards OT goals: Progressing toward goals  Acute Rehab OT Goals Patient Stated Goal: Return home OT Goal Formulation: With patient Time For Goal Achievement: 02/15/21 Potential to Achieve Goals: Good  Plan Discharge plan remains appropriate    Co-evaluation                 AM-PAC OT "6 Clicks" Daily Activity     Outcome Measure   Help from another person eating meals?: None Help from another person taking care of personal grooming?: None Help from another person toileting, which includes using toliet, bedpan, or urinal?: None Help from another person bathing (including washing, rinsing, drying)?: A Little Help from another person to put on and taking off regular upper body clothing?: None Help from another person to put on and taking off regular lower body clothing?: None 6 Click Score: 23    End of Session Equipment Utilized During Treatment: Rolling walker  OT Visit Diagnosis: Unsteadiness on feet (R26.81)   Activity Tolerance Patient tolerated  treatment well   Patient Left in bed;with call bell/phone within reach   Nurse Communication          Time: [id number] OT Time Calculation (min): 18 min  Charges: OT General Charges $OT Visit: 1 Visit OT Treatments $Self Care/Home Management : 8-22 mins  02/03/2021  Rich, OTR/L  Acute Rehabilitation Services  Office:  [phone]    [name] 02/03/2021, 4:51 PM

## 2021-02-03 NOTE — Progress Notes (Signed)
Progress Note  Patient Name: [name] Date of Encounter: 02/03/2021  [facility] Cardiologist: Rozann Lesches, MD   Subjective   No dyspnea or CP  Inpatient Medications    Scheduled Meds: . ALPRAZolam  0.125 mg Oral q AM   And  . ALPRAZolam  0.25 mg Oral QHS  . atorvastatin  40 mg Oral Daily  . carvedilol  6.25 mg Oral BID  . chlorhexidine  15 mL Mouth Rinse BID  . Chlorhexidine Gluconate Cloth  6 each Topical Daily  . clopidogrel  75 mg Oral Daily  . docusate sodium  100 mg Oral BID  . furosemide  40 mg Intravenous BID  . insulin aspart  0-5 Units Subcutaneous QHS  . insulin aspart  0-9 Units Subcutaneous TID WC  . mouth rinse  15 mL Mouth Rinse q12n4p  . pantoprazole  40 mg Oral QHS  . polyethylene glycol  17 g Oral Daily  . potassium chloride  20 mEq Oral Daily  . sodium chloride flush  3 mL Intravenous Q12H   Continuous Infusions: . sodium chloride    . heparin 1,350 Units/hr (02/02/21 1855)   PRN Meds: acetaminophen, docusate sodium, ondansetron (ZOFRAN) IV, polyethylene glycol   Vital Signs    Vitals:   02/02/21 1902 02/02/21 2023 02/03/21 0019 02/03/21 0400  BP: (!) 145/60 (!) 160/78 (!) 122/58 (!) 111/53  Pulse: 79 85 75 71  Resp: '17 19 18 19  '$ Temp: 99.7 F (37.6 C) 98.9 F (37.2 C) 98.4 F (36.9 C) 98 F (36.7 C)  TempSrc: Oral Oral Oral Oral  SpO2: 96% 93% 95% 92%  Weight:    84.4 kg  Height:        Intake/Output Summary (Last 24 hours) at 02/03/2021 0701 Last data filed at 02/03/2021 0408 Gross per 24 hour  Intake 820 ml  Output 2575 ml  Net -1755 ml   Last 3 Weights 02/03/2021 02/02/2021 02/01/2021  Weight (lbs) 186 lb 194 lb 0.1 oz 194 lb 3.6 oz  Weight (kg) 84.369 kg 88 kg 88.1 kg      Telemetry    Sinus- Personally Reviewed   Physical Exam   GEN: No acute distress.   Neck: No JVD Cardiac: RRR, no murmurs, rubs, or gallops.  Respiratory: Clear to auscultation bilaterally. GI: Soft, nontender, non-distended  MS:  No edema Neuro:  Nonfocal  Psych: Normal affect   Labs    High Sensitivity Troponin:   Recent Labs  Lab 01/30/21 2010 01/30/21 2202 02/02/21 1142  TROPONINIHS 23* 64* 416*      Chemistry Recent Labs  Lab 01/30/21 2010 01/30/21 2044 02/01/21 0415 02/02/21 0425 02/03/21 0255  NA 134*   < > 137 140 140  K 4.6   < > 3.7 3.5 3.6  CL 96*   < > 100 101 99  CO2 29   < > 29 32 33*  GLUCOSE 302*   < > 71 92 100*  BUN 32*   < > 40* 37* 38*  CREATININE 1.65*   < > 1.89* 1.64* 1.68*  CALCIUM 8.9   < > 8.5* 8.6* 8.7*  PROT 7.7  --   --   --  5.9*  ALBUMIN 3.6  --   --   --  2.8*  AST 29  --   --   --  21  ALT 20  --   --   --  15  ALKPHOS 93  --   --   --  65  BILITOT 0.6  --   --   --  1.2  GFRNONAA 31*   < > 27* 31* 31*  ANIONGAP 9   < > '8 7 8   '$ < > = values in this interval not displayed.     Hematology Recent Labs  Lab 02/01/21 0415 02/02/21 0425 02/03/21 0255  WBC 7.5 4.7 5.1  RBC 3.41* 3.38* 3.56*  HGB 9.9* 9.7* 10.1*  HCT 30.5* 30.2* 31.6*  MCV 89.4 89.3 88.8  MCH 29.0 28.7 28.4  MCHC 32.5 32.1 32.0  RDW 14.4 14.3 14.0  PLT 262 262 278    BNP Recent Labs  Lab 01/30/21 2010  BNP 587.4*     Radiology    DG CHEST PORT 1 VIEW  Result Date: 02/01/2021 CLINICAL DATA:  Cough EXAM: PORTABLE CHEST 1 VIEW COMPARISON:  01/31/2021 FINDINGS: Cardiomegaly, vascular congestion. Perihilar opacities and interstitial prominence, likely edema. No effusions. No acute bony abnormality. IMPRESSION: Cardiomegaly with vascular congestion and probable mild interstitial edema. Electronically Signed   By: Rolm Baptise M.D.   On: 02/01/2021 20:24    Patient Profile     80 y.o. female with past medical history of coronary artery disease, ischemic cardiomyopathy, hyperlipidemia, aortic stenosis, prior CVA, diabetes mellitus, history of atrial flutter ablation, chronic stage III kidney disease, dementia, history of pulmonary emboli being evaluated for possible non-STEMI and CHF.   Echocardiogram this admission shows ejection fraction 40 to 45%, severe left ventricular enlargement, grade 2 diastolic dysfunction, severe left atrial enlargement, mild mitral regurgitation, mild to moderate aortic stenosis with mean gradient 11 mmHg.  Assessment & Plan    1 acute pulmonary edema/non-ST elevation myocardial infarction-patient denies chest pain this morning and symptoms have improved.  Plan is for cardiac catheterization on Monday.  The risks and benefits including myocardial infarction, CVA, death and worsening renal function discussed and patient agrees to proceed.  We will continue Lasix today and likely hold tomorrow in anticipation of cath (to reduce risk of contrast nephropathy).  2 acute on chronic combined systolic/diastolic congestive heart failure-symptoms much improved compared to admission.  We will continue Lasix today as outlined and likely hold tomorrow.  3 cardiomyopathy-ischemic in etiology.  We will continue beta-blocker.  Add hydralazine/10 mg 3 times daily and isosorbide 30 mg daily.  Will not add ARB or Entresto given baseline renal insufficiency.  4 hypertension-blood pressure has been trending higher.  We will add hydralazine/nitrates and follow.  5 hyperlipidemia-continue statin.  6 mild to moderate aortic stenosis-patient will need follow-up echoes in the future.  7 chronic stage III kidney disease-follow renal function closely with diuresis.  8 history of bilateral pulmonary emboli-we will resume Coumadin prior to discharge.  Continue heparin for now.  For questions or updates, please contact [place] Please consult [email or web address] for contact info under        Signed, Kirk Ruths, MD  02/03/2021, 7:01 AM

## 2021-02-03 NOTE — Progress Notes (Signed)
PROGRESS NOTE    [name]  [id number] DOB: 07-15-1941 DOA: 01/30/2021 PCP: Celene Squibb, MD   Chief Complaint  Patient presents with  . Shortness of Breath  . Code STEMI   Brief Narrative: 80 yo female with pmh afib on chronic coumadin, h/o pe's, h/o cva, hyperlipidemia, HTN, cad s/p DES to RCA 2006/2009/2021, HFpEF, dm2 who presented with progressive sob and hypoxic resp failure.  Per discussion with daughter, she notes SOB was sudden onset, no gradual progression.  On presentation, she was satting 78% and placed on NIV, but became unresponsive.  She was intubated for airway protection.  She's been diuresed over the past several days and has gradually improved.  She was transferred to [facility] on 4/22.   Assessment & Plan:   Active Problems:   Acute on chronic heart failure [facility])   Hypertensive emergency  Acute Pulmonary Edema  NSTEMI  Hx CAD s/p DES In the setting of above, trop rising at presentation, continuing to rise today She's asymptomatic Continue plavix. Cardiology c/s, appreciate recs - continue plavix, heparin - plan for cath monday  Acute Hypoxic Respiratory Failure secondary to Acute Systolic and Diastolic Heart Failure Exacerbations Respiratory status improving - on RA today Continue lasix today (likely hold 4/24) Continue coreg.  Not on arb/entresto due to renal function. Hydral/nitrates per cards Cardiology c/s, appreciate recs Strict I/O, daily weights Echo with EF 40-45%, global hypokinesis, grade 2 diastolic dysfunction, mild to moderate aortic stenosis (see below)  T2DM Hold glipizide Continue SSI for now Follow  Hx Atrial Fibrillation Heparin per pharmacy  Hx PE Heparin as noted above  CKD 3b Fluctuating, follow with diuresis  HLD Statin  GERD PPI  Concern for OSA Needs sleep study outpatient   DVT prophylaxis: warfarin Code Status: partial - no CPR, intubation ok Family Communication: none at bedside Disposition:   Status  is: Inpatient  Remains inpatient appropriate because:Inpatient level of care appropriate due to severity of illness   Dispo: The patient is from: Home              Anticipated d/c is to: Home              Patient currently is not medically stable to d/c.   Difficult to place patient No       Consultants:   PCCM  Procedures:  Echo IMPRESSIONS    1. Left ventricular ejection fraction, by estimation, is 40 to 45%. Left  ventricular ejection fraction by 3D volume is 42 %. The left ventricle has  mildly decreased function. The left ventricle demonstrates global  hypokinesis. The left ventricular  internal cavity size was severely dilated. There is severe left  ventricular hypertrophy of the basal-septal segment. Left ventricular  diastolic parameters are consistent with Grade II diastolic dysfunction  (pseudonormalization). Elevated left ventricular  end-diastolic pressure. The average left ventricular global longitudinal  strain is -9.1 %. The global longitudinal strain is abnormal.  2. Right ventricular systolic function is normal. The right ventricular  size is normal. There is mildly elevated pulmonary artery systolic  pressure. The estimated right ventricular systolic pressure is [id number] mmHg.  3. Left atrial size was severely dilated.  4. The mitral valve is degenerative. Mild mitral valve regurgitation.  Severe mitral annular calcification.The posterior MV leaflet appears fixed  and immobile.  5. The aortic valve is calcified. There is severe calcifcation of the  aortic valve. There is severe thickening of the aortic valve. Aortic valve  regurgitation is not  visualized. Mild to moderate aortic valve stenosis.  Aortic valve mean gradient measures  11.0 mmHg. Aortic valve Vmax measures 2.16 m/s. In the setting of LV  dysfunction the AV gradient may be underestimated and likely is more on  the moderate AS side.  6. The inferior vena cava is normal in size with  greater than 50%  respiratory variability, suggesting right atrial pressure of 3  mmHg.Compared to prior echo 2021, LVF appears similar, AV mean gradient  with no signfiicant change.   Antimicrobials:  Anti-infectives (From admission, onward)   None        Subjective: No new complaints  Objective: Vitals:   02/03/21 0400 02/03/21 0950 02/03/21 1211 02/03/21 1535  BP: (!) 111/53 (!) 120/54 (!) 123/55 121/67  Pulse: 71 66 71 67  Resp: '19 19 17 18  '$ Temp: 98 F (36.7 C) 98.4 F (36.9 C) 98.4 F (36.9 C) 99 F (37.2 C)  TempSrc: Oral Oral Oral Oral  SpO2: 92% 93% 94% 96%  Weight: 84.4 kg     Height:        Intake/Output Summary (Last 24 hours) at 02/03/2021 1536 Last data filed at 02/03/2021 1534 Gross per 24 hour  Intake 617.84 ml  Output 3276 ml  Net -2658.16 ml   Filed Weights   02/01/21 1220 02/02/21 0500 02/03/21 0400  Weight: 88.1 kg 88 kg 84.4 kg    Examination:  General: No acute distress. Cardiovascular: Heart sounds show Alireza Pollack regular rate, and rhythm.  Lungs: Clear to auscultation bilaterally Abdomen: Soft, nontender, nondistended  Neurological: Alert and oriented 3. Moves all extremities 4. Cranial nerves II through XII grossly intact. Skin: Warm and dry. No rashes or lesions. Extremities: No clubbing or cyanosis. No edema.    Data Reviewed: I have personally reviewed following labs and imaging studies  CBC: Recent Labs  Lab 01/30/21 2010 01/30/21 2044 01/30/21 2131 01/31/21 0222 02/01/21 0415 02/02/21 0425 02/03/21 0255  WBC 7.5  --  7.2 7.5 7.5 4.7 5.1  NEUTROABS 2.6  --   --   --   --   --  2.0  HGB 13.0   < > 12.1 11.6* 9.9* 9.7* 10.1*  HCT 41.5   < > 38.8 36.7 30.5* 30.2* 31.6*  MCV 92.4  --  93.3 90.8 89.4 89.3 88.8  PLT 365  --  318 301 262 262 278   < > = values in this interval not displayed.    Basic Metabolic Panel: Recent Labs  Lab 01/30/21 2010 01/30/21 2044 01/30/21 2131 01/31/21 0222 02/01/21 0415 02/02/21 0425  02/03/21 0255  NA 134* 133*  --  134* 137 140 140  K 4.6 4.5  --  5.4* 3.7 3.5 3.6  CL 96*  --   --  99 100 101 99  CO2 29  --   --  27 29 32 33*  GLUCOSE 302*  --   --  287* 71 92 100*  BUN 32*  --   --  34* 40* 37* 38*  CREATININE 1.65*  --  1.53* 1.60* 1.89* 1.64* 1.68*  CALCIUM 8.9  --   --  8.4* 8.5* 8.6* 8.7*  MG  --   --   --  2.2  --   --  2.0  PHOS  --   --   --  4.4  --   --  3.7    GFR: Estimated Creatinine Clearance: 29.2 mL/min (Courtney Bellizzi) (by C-G formula based on SCr of 1.68 mg/dL (H)).  Liver Function Tests: Recent Labs  Lab 01/30/21 2010 02/03/21 0255  AST 29 21  ALT 20 15  ALKPHOS 93 65  BILITOT 0.6 1.2  PROT 7.7 5.9*  ALBUMIN 3.6 2.8*    CBG: Recent Labs  Lab 02/02/21 1730 02/02/21 2103 02/03/21 0606 02/03/21 0713 02/03/21 1205  GLUCAP 188* 239* 122* 128* 190*     Recent Results (from the past 240 hour(s))  Resp Panel by RT-PCR (Flu Hilmar Moldovan&B, Covid) Nasopharyngeal Swab     Status: None   Collection Time: 01/30/21  7:46 PM   Specimen: Nasopharyngeal Swab; Nasopharyngeal(NP) swabs in vial transport medium  Result Value Ref Range Status   SARS Coronavirus 2 by RT PCR NEGATIVE NEGATIVE Final    Comment: (NOTE) SARS-CoV-2 target nucleic acids are NOT DETECTED.  The SARS-CoV-2 RNA is generally detectable in upper respiratory specimens during the acute phase of infection. The lowest concentration of SARS-CoV-2 viral copies this assay can detect is 138 copies/mL. Sabriya Yono negative result does not preclude SARS-Cov-2 infection and should not be used as the sole basis for treatment or other patient management decisions. Avishai Reihl negative result may occur with  improper specimen collection/handling, submission of specimen other than nasopharyngeal swab, presence of viral mutation(s) within the areas targeted by this assay, and inadequate number of viral copies(<138 copies/mL). Aleighna Wojtas negative result must be combined with clinical observations, patient history, and  epidemiological information. The expected result is Negative.  Fact Sheet for Patients:  EntrepreneurPulse.com.au  Fact Sheet for Healthcare Providers:  IncredibleEmployment.be  This test is no t yet approved or cleared by the [place] FDA and  has been authorized for detection and/or diagnosis of SARS-CoV-2 by FDA under an Emergency Use Authorization (EUA). This EUA will remain  in effect (meaning this test can be used) for the duration of the COVID-19 declaration under Section 564(b)(1) of the Act, 21 U.S.C.section 360bbb-3(b)(1), unless the authorization is terminated  or revoked sooner.       Influenza Jennika Ringgold by PCR NEGATIVE NEGATIVE Final   Influenza B by PCR NEGATIVE NEGATIVE Final    Comment: (NOTE) The Xpert Xpress SARS-CoV-2/FLU/RSV plus assay is intended as an aid in the diagnosis of influenza from Nasopharyngeal swab specimens and should not be used as Olga Seyler sole basis for treatment. Nasal washings and aspirates are unacceptable for Xpert Xpress SARS-CoV-2/FLU/RSV testing.  Fact Sheet for Patients: EntrepreneurPulse.com.au  Fact Sheet for Healthcare Providers: IncredibleEmployment.be  This test is not yet approved or cleared by the [place] FDA and has been authorized for detection and/or diagnosis of SARS-CoV-2 by FDA under an Emergency Use Authorization (EUA). This EUA will remain in effect (meaning this test can be used) for the duration of the COVID-19 declaration under Section 564(b)(1) of the Act, 21 U.S.C. section 360bbb-3(b)(1), unless the authorization is terminated or revoked.  Performed at [facility], [place] [address] [place], [place] [address]   Culture, blood (routine x 2)     Status: None (Preliminary result)   Collection Time: 01/30/21  9:45 PM   Specimen: BLOOD RIGHT HAND  Result Value Ref Range Status   Specimen Description BLOOD RIGHT HAND  Final   Special  Requests   Final    AEROBIC BOTTLE ONLY Blood Culture results may not be optimal due to an inadequate volume of blood received in culture bottles   Culture   Final    NO GROWTH 3 DAYS Performed at [facility], [place] [address] [place], [place] [address]  Report Status PENDING  Incomplete  Culture, blood (routine x 2)     Status: Abnormal   Collection Time: 01/30/21  9:50 PM   Specimen: BLOOD RIGHT HAND  Result Value Ref Range Status   Specimen Description BLOOD RIGHT HAND  Final   Special Requests AEROBIC BOTTLE ONLY Blood Culture adequate volume  Final   Culture  Setup Time   Final    AEROBIC BOTTLE ONLY GRAM POSITIVE COCCI CRITICAL RESULT CALLED TO, READ BACK BY AND VERIFIED WITH: J LEDFORD PHARMD 02/01/21 0238 JDW    Culture (Shanieka Blea)  Final    STAPHYLOCOCCUS EPIDERMIDIS THE SIGNIFICANCE OF ISOLATING THIS ORGANISM FROM Jayleah Garbers SINGLE SET OF BLOOD CULTURES WHEN MULTIPLE SETS ARE DRAWN IS UNCERTAIN. PLEASE NOTIFY THE MICROBIOLOGY DEPARTMENT WITHIN ONE WEEK IF SPECIATION AND SENSITIVITIES ARE REQUIRED. Performed at [facility], [place] [address] [place], [place] [address]    Report Status 02/02/2021 FINAL  Final  Blood Culture ID Panel (Reflexed)     Status: Abnormal   Collection Time: 01/30/21  9:50 PM  Result Value Ref Range Status   Enterococcus faecalis NOT DETECTED NOT DETECTED Final   Enterococcus Faecium NOT DETECTED NOT DETECTED Final   Listeria monocytogenes NOT DETECTED NOT DETECTED Final   Staphylococcus species DETECTED (Trustin Chapa) NOT DETECTED Final    Comment: CRITICAL RESULT CALLED TO, READ BACK BY AND VERIFIED WITH: J LEDFORD PHARMD 02/01/21 0238 JDW    Staphylococcus aureus (BCID) NOT DETECTED NOT DETECTED Final   Staphylococcus epidermidis DETECTED (Ayasha Ellingsen) NOT DETECTED Final    Comment: Methicillin (oxacillin) resistant coagulase negative staphylococcus. Possible blood culture contaminant (unless isolated from more than one blood culture draw or clinical case suggests  pathogenicity). No antibiotic treatment is indicated for blood  culture contaminants. CRITICAL RESULT CALLED TO, READ BACK BY AND VERIFIED WITH: J LEDFORD [facility] 02/01/21 0238 JDW    Staphylococcus lugdunensis NOT DETECTED NOT DETECTED Final   Streptococcus species NOT DETECTED NOT DETECTED Final   Streptococcus agalactiae NOT DETECTED NOT DETECTED Final   Streptococcus pneumoniae NOT DETECTED NOT DETECTED Final   Streptococcus pyogenes NOT DETECTED NOT DETECTED Final   Madell Heino.calcoaceticus-baumannii NOT DETECTED NOT DETECTED Final   Bacteroides fragilis NOT DETECTED NOT DETECTED Final   Enterobacterales NOT DETECTED NOT DETECTED Final   Enterobacter cloacae complex NOT DETECTED NOT DETECTED Final   Escherichia coli NOT DETECTED NOT DETECTED Final   Klebsiella aerogenes NOT DETECTED NOT DETECTED Final   Klebsiella oxytoca NOT DETECTED NOT DETECTED Final   Klebsiella pneumoniae NOT DETECTED NOT DETECTED Final   Proteus species NOT DETECTED NOT DETECTED Final   Salmonella species NOT DETECTED NOT DETECTED Final   Serratia marcescens NOT DETECTED NOT DETECTED Final   Haemophilus influenzae NOT DETECTED NOT DETECTED Final   Neisseria meningitidis NOT DETECTED NOT DETECTED Final   Pseudomonas aeruginosa NOT DETECTED NOT DETECTED Final   Stenotrophomonas maltophilia NOT DETECTED NOT DETECTED Final   Candida albicans NOT DETECTED NOT DETECTED Final   Candida auris NOT DETECTED NOT DETECTED Final   Candida glabrata NOT DETECTED NOT DETECTED Final   Candida krusei NOT DETECTED NOT DETECTED Final   Candida parapsilosis NOT DETECTED NOT DETECTED Final   Candida tropicalis NOT DETECTED NOT DETECTED Final   Cryptococcus neoformans/gattii NOT DETECTED NOT DETECTED Final   Methicillin resistance mecA/C DETECTED (Deloma Spindle) NOT DETECTED Final    Comment: CRITICAL RESULT CALLED TO, READ BACK BY AND VERIFIED WITHLenna Sciara [facility] 02/01/21 0238 JDW Performed at [facility], [address], [place],  [place] [address]   MRSA PCR Screening     Status: None   Collection Time: 01/31/21 12:36 AM   Specimen: Nasal Mucosa; Nasopharyngeal  Result Value Ref Range Status   MRSA by PCR NEGATIVE NEGATIVE Final    Comment:        The GeneXpert MRSA Assay (FDA approved for NASAL specimens only), is one component of Geremy Rister comprehensive MRSA colonization surveillance program. It is not intended to diagnose MRSA infection nor to guide or monitor treatment for MRSA infections. Performed at [facility], [place] [address], [place], [place] [address]   Expectorated Sputum Assessment w Gram Stain, Rflx to Resp Cult     Status: None   Collection Time: 02/02/21 10:30 PM   Specimen: Expectorated Sputum  Result Value Ref Range Status   Specimen Description EXPECTORATED SPUTUM  Final   Special Requests NONE  Final   Sputum evaluation   Final    THIS SPECIMEN IS ACCEPTABLE FOR SPUTUM CULTURE Performed at [facility], [place] [address] [place], [place] [address]    Report Status 02/03/2021 FINAL  Final  Culture, Respiratory w Gram Stain     Status: None (Preliminary result)   Collection Time: 02/02/21 10:30 PM  Result Value Ref Range Status   Specimen Description EXPECTORATED SPUTUM  Final   Special Requests NONE Reflexed from [id number]  Final   Gram Stain   Final    RARE WBC PRESENT,BOTH PMN AND MONONUCLEAR ABUNDANT GRAM POSITIVE COCCI IN PAIRS FEW GRAM NEGATIVE RODS Performed at [facility], [place] [address], [place],  [address]    Culture PENDING  Incomplete   Report Status PENDING  Incomplete         Radiology Studies: DG CHEST PORT 1 VIEW  Result Date: 02/01/2021 CLINICAL DATA:  Cough EXAM: PORTABLE CHEST 1 VIEW COMPARISON:  01/31/2021 FINDINGS: Cardiomegaly, vascular congestion. Perihilar opacities and interstitial prominence, likely edema. No effusions. No acute bony abnormality. IMPRESSION: Cardiomegaly with vascular congestion and probable mild interstitial edema.  Electronically Signed   By: Rolm Baptise M.D.   On: 02/01/2021 20:24        Scheduled Meds: . ALPRAZolam  0.125 mg Oral q AM   And  . ALPRAZolam  0.25 mg Oral QHS  . atorvastatin  40 mg Oral Daily  . carvedilol  6.25 mg Oral BID  . chlorhexidine  15 mL Mouth Rinse BID  . Chlorhexidine Gluconate Cloth  6 each Topical Daily  . clopidogrel  75 mg Oral Daily  . docusate sodium  100 mg Oral BID  . furosemide  40 mg Intravenous BID  . insulin aspart  0-5 Units Subcutaneous QHS  . insulin aspart  0-9 Units Subcutaneous TID WC  . mouth rinse  15 mL Mouth Rinse q12n4p  . pantoprazole  40 mg Oral QHS  . polyethylene glycol  17 g Oral Daily  . potassium chloride  20 mEq Oral Daily  . sodium chloride flush  3 mL Intravenous Q12H   Continuous Infusions: . sodium chloride    . heparin 1,350 Units/hr (02/03/21 1402)     LOS: 4 days    Time spent: over 77 min    Fayrene Helper, MD [facility]   To contact the attending provider between 7A-7P or the covering provider during after hours 7P-7A, please log into the web site [email or web address] and access using universal [place] password for that web site. If you do not have the password, please  call the hospital operator.  02/03/2021, 3:36 PM

## 2021-02-03 NOTE — Progress Notes (Signed)
ANTICOAGULATION CONSULT NOTE - Follow Up Consult  Pharmacy Consult for Heparin Indication: atrial flutter and hx PE 2009  No Known Allergies  Patient Measurements: Height: '5\' 6"'$  (167.6 cm) Weight: 84.4 kg (186 lb) IBW/kg (Calculated) : 59.3  Vital Signs: Temp: 98.4 F (36.9 C) (04/23 1211) Temp Source: Oral (04/23 1211) BP: 123/55 (04/23 1211) Pulse Rate: 71 (04/23 1211)  Labs: Recent Labs    02/01/21 0415 02/02/21 0425 02/02/21 1142 02/03/21 0255 02/03/21 1156  HGB 9.9* 9.7*  --  10.1*  --   HCT 30.5* 30.2*  --  31.6*  --   PLT 262 262  --  278  --   LABPROT 24.8* 20.9*  --  21.6*  --   INR 2.3* 1.8*  --  1.9*  --   HEPARINUNFRC 0.46  --   --  0.30 0.52  CREATININE 1.89* 1.64*  --  1.68*  --   TROPONINIHS  --   --  416*  --   --     Estimated Creatinine Clearance: 29.2 mL/min (A) (by C-G formula based on SCr of 1.68 mg/dL (H)).  Assessment: 62 YOF presented 01/30/21 with SOB, hx of PE and aflutter on warfarin PTA. INR subtherapeutic on admission at 1.7 and heparin drip begun while holding warfarin for possible procedures. INR up to 2.3 on 4/21 and no procedures planned, so IV heparin was stopped and warfarin resumed with usual dose of 5 mg. INR 1.8 today, subtherapeutic. Also on Plavix as PTA for hx multiple stents.  PTA warfarin regimen: 5 mg daily. Last outpatient dose 4/19; warfarin was held on 01/31/21.   Cardiology planning cath on Monday for concern for ACS. Pharmacy consulted to transition from warfarin to heparin drip over the weekend while awaiting procedure. INR 1.9 this AM. CBC stable. No active bleed issues reported. Heparin level is therapeutic.   Goal of Therapy:  Heparin level 0.3-0.7 units/ml Monitor platelets by anticoagulation protocol: Yes   Plan:  Cont heparin at 1350 units/hr Daily HL and CBC  Norina Buzzard, PharmD PGY1 Pharmacy Resident 02/03/2021 1:07 PM

## 2021-02-03 NOTE — Progress Notes (Signed)
ANTICOAGULATION CONSULT NOTE - Follow Up Consult  Pharmacy Consult for Heparin Indication: atrial flutter and hx PE 2009  No Known Allergies  Patient Measurements: Height: '5\' 6"'$  (167.6 cm) Weight: 88 kg (194 lb 0.1 oz) IBW/kg (Calculated) : 59.3  Vital Signs: Temp: 98.4 F (36.9 C) (04/23 0019) Temp Source: Oral (04/23 0019) BP: 122/58 (04/23 0019) Pulse Rate: 75 (04/23 0019)  Labs: Recent Labs    01/31/21 1852 02/01/21 0415 02/01/21 0415 02/02/21 0425 02/02/21 1142 02/03/21 0255  HGB  --  9.9*   < > 9.7*  --  10.1*  HCT  --  30.5*  --  30.2*  --  31.6*  PLT  --  262  --  262  --  278  LABPROT  --  24.8*  --  20.9*  --  21.6*  INR  --  2.3*  --  1.8*  --  1.9*  HEPARINUNFRC 0.23* 0.46  --   --   --  0.30  CREATININE  --  1.89*  --  1.64*  --   --   TROPONINIHS  --   --   --   --  416*  --    < > = values in this interval not displayed.    Estimated Creatinine Clearance: 30.6 mL/min (A) (by C-G formula based on SCr of 1.64 mg/dL (H)).  Assessment: 32 YOF presented 01/30/21 with SOB, hx of PE and aflutter on warfarin PTA. INR subtherapeutic on admission at 1.7 and heparin drip begun while holding warfarin for possible procedures. INR up to 2.3 on 4/21 and no procedures planned, so IV heparin was stopped and warfarin resumed with usual dose of 5 mg. INR 1.8 today, subtherapeutic. Also on Plavix as PTA for hx multiple stents.  4/22 PM update - Cardiology planning cath on Monday for concern for ACS. Pharmacy consulted to transition from warfarin to heparin drip over the weekend while awaiting procedure. INR 1.8 this AM. CBC stable. No active bleed issues reported. Patient received warfarin dose earlier this evening.  PTA warfarin regimen: 5 mg daily. Last outpatient dose 4/19; warfarin was held on 01/31/21.   4/23 AM update:  Warfarin on hold as above, INR 1.9 Heparin level is therapeutic  Goal of Therapy:  Heparin level 0.3-0.7 units/ml Monitor platelets by  anticoagulation protocol: Yes   Plan:  Cont heparin at 1350 units/hr 1200 heparin level  Narda Bonds, PharmD, BCPS Clinical Pharmacist Phone: [phone]

## 2021-02-04 DIAGNOSIS — I214 Non-ST elevation (NSTEMI) myocardial infarction: Principal | ICD-10-CM

## 2021-02-04 LAB — GLUCOSE, CAPILLARY
Glucose-Capillary: 121 mg/dL — ABNORMAL HIGH (ref 70–99)
Glucose-Capillary: 209 mg/dL — ABNORMAL HIGH (ref 70–99)
Glucose-Capillary: 258 mg/dL — ABNORMAL HIGH (ref 70–99)
Glucose-Capillary: 333 mg/dL — ABNORMAL HIGH (ref 70–99)

## 2021-02-04 LAB — BASIC METABOLIC PANEL
Anion gap: 7 (ref 5–15)
BUN: 48 mg/dL — ABNORMAL HIGH (ref 8–23)
CO2: 34 mmol/L — ABNORMAL HIGH (ref 22–32)
Calcium: 8.6 mg/dL — ABNORMAL LOW (ref 8.9–10.3)
Chloride: 99 mmol/L (ref 98–111)
Creatinine, Ser: 1.79 mg/dL — ABNORMAL HIGH (ref 0.44–1.00)
GFR, Estimated: 28 mL/min — ABNORMAL LOW (ref >60)
Glucose, Bld: 108 mg/dL — ABNORMAL HIGH (ref 70–99)
Potassium: 3.8 mmol/L (ref 3.5–5.1)
Sodium: 140 mmol/L (ref 135–145)

## 2021-02-04 LAB — PROTIME-INR
INR: 1.9 — ABNORMAL HIGH (ref 0.8–1.2)
Prothrombin Time: 21.9 seconds — ABNORMAL HIGH (ref 11.4–15.2)

## 2021-02-04 LAB — HEPARIN LEVEL (UNFRACTIONATED): Heparin Unfractionated: 0.56 IU/mL (ref 0.30–0.70)

## 2021-02-04 LAB — CBC
HCT: 31.1 % — ABNORMAL LOW (ref 36.0–46.0)
Hemoglobin: 9.8 g/dL — ABNORMAL LOW (ref 12.0–15.0)
MCH: 28.5 pg (ref 26.0–34.0)
MCHC: 31.5 g/dL (ref 30.0–36.0)
MCV: 90.4 fL (ref 80.0–100.0)
Platelets: 306 10*3/uL (ref 150–400)
RBC: 3.44 MIL/uL — ABNORMAL LOW (ref 3.87–5.11)
RDW: 14.2 % (ref 11.5–15.5)
WBC: 4.8 10*3/uL (ref 4.0–10.5)
nRBC: 0 % (ref 0.0–0.2)

## 2021-02-04 MED ORDER — HYDRALAZINE HCL 10 MG PO TABS
10.0000 mg | ORAL_TABLET | Freq: Three times a day (TID) | ORAL | Status: DC
  Administered 2021-02-04 – 2021-02-06 (×7): 10 mg via ORAL
  Filled 2021-02-04 (×8): qty 1

## 2021-02-04 MED ORDER — ASPIRIN 81 MG PO CHEW
81.0000 mg | CHEWABLE_TABLET | Freq: Every day | ORAL | Status: DC
  Administered 2021-02-04 – 2021-02-06 (×3): 81 mg via ORAL
  Filled 2021-02-04 (×4): qty 1

## 2021-02-04 MED ORDER — ISOSORBIDE MONONITRATE ER 30 MG PO TB24
15.0000 mg | ORAL_TABLET | Freq: Every day | ORAL | Status: DC
  Administered 2021-02-04 – 2021-02-06 (×3): 15 mg via ORAL
  Filled 2021-02-04 (×3): qty 1

## 2021-02-04 MED ORDER — INSULIN ASPART 100 UNIT/ML ~~LOC~~ SOLN
2.0000 [IU] | Freq: Three times a day (TID) | SUBCUTANEOUS | Status: DC
  Administered 2021-02-05 – 2021-02-06 (×3): 2 [IU] via SUBCUTANEOUS

## 2021-02-04 NOTE — Progress Notes (Signed)
Progress Note  Patient Name: [name] Date of Encounter: 02/04/2021  [facility] Cardiologist: Rozann Lesches, MD   Subjective   Pt denies CP or dyspnea  Inpatient Medications    Scheduled Meds: . ALPRAZolam  0.125 mg Oral q AM   And  . ALPRAZolam  0.25 mg Oral QHS  . atorvastatin  40 mg Oral Daily  . carvedilol  6.25 mg Oral BID  . chlorhexidine  15 mL Mouth Rinse BID  . Chlorhexidine Gluconate Cloth  6 each Topical Daily  . clopidogrel  75 mg Oral Daily  . docusate sodium  100 mg Oral BID  . insulin aspart  0-5 Units Subcutaneous QHS  . insulin aspart  0-9 Units Subcutaneous TID WC  . mouth rinse  15 mL Mouth Rinse q12n4p  . pantoprazole  40 mg Oral QHS  . polyethylene glycol  17 g Oral Daily  . potassium chloride  20 mEq Oral Daily  . sodium chloride flush  3 mL Intravenous Q12H   Continuous Infusions: . sodium chloride    . heparin 1,350 Units/hr (02/03/21 1800)   PRN Meds: acetaminophen, docusate sodium, ondansetron (ZOFRAN) IV, polyethylene glycol   Vital Signs    Vitals:   02/03/21 1649 02/03/21 1951 02/04/21 0003 02/04/21 0408  BP:  (!) 130/52 (!) 111/52 (!) 107/54  Pulse:  71 64 65  Resp:  '18 18 18  '$ Temp:  98.5 F (36.9 C) 97.7 F (36.5 C) 97.7 F (36.5 C)  TempSrc:  Oral Oral Oral  SpO2: 97% 95% 95% 92%  Weight:    84 kg  Height:        Intake/Output Summary (Last 24 hours) at 02/04/2021 0754 Last data filed at 02/04/2021 0543 Gross per 24 hour  Intake 601.73 ml  Output 1101 ml  Net -499.27 ml   Last 3 Weights 02/04/2021 02/03/2021 02/02/2021  Weight (lbs) 185 lb 1.6 oz 186 lb 194 lb 0.1 oz  Weight (kg) 83.961 kg 84.369 kg 88 kg      Telemetry    Sinus rhythm with rare PVC- Personally Reviewed   Physical Exam   GEN: No acute distress.  WD WN Neck: No JVD, supple Cardiac: RRR Respiratory: CTA GI: Soft, NT/ND MS: No edema Neuro:  Grossly intact Psych: Normal affect   Labs    High Sensitivity Troponin:   Recent  Labs  Lab 01/30/21 2010 01/30/21 2202 02/02/21 1142  TROPONINIHS 23* 64* 416*      Chemistry Recent Labs  Lab 01/30/21 2010 01/30/21 2044 02/02/21 0425 02/03/21 0255 02/04/21 0155  NA 134*   < > 140 140 140  K 4.6   < > 3.5 3.6 3.8  CL 96*   < > 101 99 99  CO2 29   < > 32 33* 34*  GLUCOSE 302*   < > 92 100* 108*  BUN 32*   < > 37* 38* 48*  CREATININE 1.65*   < > 1.64* 1.68* 1.79*  CALCIUM 8.9   < > 8.6* 8.7* 8.6*  PROT 7.7  --   --  5.9*  --   ALBUMIN 3.6  --   --  2.8*  --   AST 29  --   --  21  --   ALT 20  --   --  15  --   ALKPHOS 93  --   --  65  --   BILITOT 0.6  --   --  1.2  --  GFRNONAA 31*   < > 31* 31* 28*  ANIONGAP 9   < > '7 8 7   '$ < > = values in this interval not displayed.     Hematology Recent Labs  Lab 02/02/21 0425 02/03/21 0255 02/04/21 0155  WBC 4.7 5.1 4.8  RBC 3.38* 3.56* 3.44*  HGB 9.7* 10.1* 9.8*  HCT 30.2* 31.6* 31.1*  MCV 89.3 88.8 90.4  MCH 28.7 28.4 28.5  MCHC 32.1 32.0 31.5  RDW 14.3 14.0 14.2  PLT 262 278 306    BNP Recent Labs  Lab 01/30/21 2010  BNP 587.4*     Patient Profile     80 y.o. female with past medical history of coronary artery disease, ischemic cardiomyopathy, hyperlipidemia, aortic stenosis, prior CVA, diabetes mellitus, history of atrial flutter ablation, chronic stage III kidney disease, dementia, history of pulmonary emboli being evaluated for possible non-STEMI and CHF.  Echocardiogram this admission shows ejection fraction 40 to 45%, severe left ventricular enlargement, grade 2 diastolic dysfunction, severe left atrial enlargement, mild mitral regurgitation, mild to moderate aortic stenosis with mean gradient 11 mmHg.  Assessment & Plan    1 acute pulmonary edema/non-nonST elevation myocardial infarction-patient denies chest pain.  Renal function is worse today.  Hold diuretics.  Plan is to proceed with cardiac catheterization tomorrow pending renal function.  We will keep n.p.o. after midnight but if  creatinine increases further may need to delay.  Continue aspirin, Plavix, beta-blocker, heparin and statin.  2 acute on chronic combined systolic/diastolic congestive heart failure-she is euvolemic today.  Hold diuretics in anticipation of cardiac catheterization.  3 cardiomyopathy-ischemic in etiology.  We will continue beta-blocker.  Will not add ARB or Entresto given baseline renal insufficiency.  Begin low-dose hydralazine/nitrates and increase as tolerated.  4 hypertension-follow blood pressure and adjust medications as needed.  5 hyperlipidemia-continue statin.  6 mild to moderate aortic stenosis-patient will need follow-up echoes in the future.  7 chronic stage III kidney disease-renal function worse today.  Likely secondary to diuresis for pulmonary edema.  Will hold diuretics and follow.  8 history of bilateral pulmonary emboli-we will resume Coumadin prior to discharge.  Continue heparin for now.  For questions or updates, please contact [place] Please consult [email or web address] for contact info under        Signed, Kirk Ruths, MD  02/04/2021, 7:54 AM

## 2021-02-04 NOTE — Progress Notes (Signed)
PROGRESS NOTE    [name]  [id number] DOB: 29-Oct-1940 DOA: 01/30/2021 PCP: Celene Squibb, MD   Chief Complaint  Patient presents with  . Shortness of Breath  . Code STEMI   Brief Narrative: 79 yo female with pmh afib on chronic coumadin, h/o pe's, h/o cva, hyperlipidemia, HTN, cad s/p DES to RCA 2006/2009/2021, HFpEF, dm2 who presented with progressive sob and hypoxic resp failure.  Per discussion with daughter, she notes SOB was sudden onset, no gradual progression.  On presentation, she was satting 78% and placed on NIV, but became unresponsive.  She was intubated for airway protection.  She's been diuresed over the past several days and has gradually improved.  She was transferred to [facility] on 4/22.   Assessment & Plan:   Active Problems:   Acute on chronic heart failure (HCC)   Hypertensive emergency  Acute Pulmonary Edema  NSTEMI  Hx CAD s/p DES In the setting of above, trop rising at presentation, continuing to rise today She's asymptomatic Continue plavix, aspirin, statin, coreg Cardiology c/s, appreciate recs - continue plavix, heparin - plan for cath Monday pending renal function  Acute Hypoxic Respiratory Failure secondary to Acute Systolic and Diastolic Heart Failure Exacerbations Respiratory status improving - on RA today Holding lasix in preparation for cath tomorrow Continue coreg.  Not on arb/entresto due to renal function. Hydral/nitrates per cards Cardiology c/s, appreciate recs Strict I/O, daily weights Echo with EF 40-45%, global hypokinesis, grade 2 diastolic dysfunction, mild to moderate aortic stenosis (see below)  Mild to Moderate Aortic Stenosis Needs follow up echoes in the future  T2DM Hold glipizide Continue SSI for now Follow  Hx Atrial Fibrillation Heparin per pharmacy  Hx PE Heparin as noted above  CKD 3b Fluctuating, follow with diuresis - trending up today, diuresis on hold  HLD Statin  GERD PPI  Concern for  OSA Needs sleep study outpatient   DVT prophylaxis: warfarin Code Status: partial - no CPR, intubation ok Family Communication: none at bedside Disposition:   Status is: Inpatient  Remains inpatient appropriate because:Inpatient level of care appropriate due to severity of illness   Dispo: The patient is from: Home              Anticipated d/c is to: Home              Patient currently is not medically stable to d/c.   Difficult to place patient No       Consultants:   PCCM  Procedures:  Echo IMPRESSIONS    1. Left ventricular ejection fraction, by estimation, is 40 to 45%. Left  ventricular ejection fraction by 3D volume is 42 %. The left ventricle has  mildly decreased function. The left ventricle demonstrates global  hypokinesis. The left ventricular  internal cavity size was severely dilated. There is severe left  ventricular hypertrophy of the basal-septal segment. Left ventricular  diastolic parameters are consistent with Grade II diastolic dysfunction  (pseudonormalization). Elevated left ventricular  end-diastolic pressure. The average left ventricular global longitudinal  strain is -9.1 %. The global longitudinal strain is abnormal.  2. Right ventricular systolic function is normal. The right ventricular  size is normal. There is mildly elevated pulmonary artery systolic  pressure. The estimated right ventricular systolic pressure is [id number] mmHg.  3. Left atrial size was severely dilated.  4. The mitral valve is degenerative. Mild mitral valve regurgitation.  Severe mitral annular calcification.The posterior MV leaflet appears fixed  and immobile.  5. The  aortic valve is calcified. There is severe calcifcation of the  aortic valve. There is severe thickening of the aortic valve. Aortic valve  regurgitation is not visualized. Mild to moderate aortic valve stenosis.  Aortic valve mean gradient measures  11.0 mmHg. Aortic valve Vmax measures 2.16 m/s. In  the setting of LV  dysfunction the AV gradient may be underestimated and likely is more on  the moderate AS side.  6. The inferior vena cava is normal in size with greater than 50%  respiratory variability, suggesting right atrial pressure of 3  mmHg.Compared to prior echo 2021, LVF appears similar, AV mean gradient  with no signfiicant change.   Antimicrobials:  Anti-infectives (From admission, onward)   None        Subjective: No complaints today  Objective: Vitals:   02/04/21 0003 02/04/21 0408 02/04/21 0832 02/04/21 1401  BP: (!) 111/52 (!) 107/54 (!) 113/54   Pulse: 64 65 74 73  Resp: '18 18 18 17  '$ Temp: 97.7 F (36.5 C) 97.7 F (36.5 C) 98 F (36.7 C) 98 F (36.7 C)  TempSrc: Oral Oral Oral Oral  SpO2: 95% 92% 91%   Weight:  84 kg    Height:        Intake/Output Summary (Last 24 hours) at 02/04/2021 1645 Last data filed at 02/04/2021 1542 Gross per 24 hour  Intake 1116.67 ml  Output 401 ml  Net 715.67 ml   Filed Weights   02/02/21 0500 02/03/21 0400 02/04/21 0408  Weight: 88 kg 84.4 kg 84 kg    Examination:  General: No acute distress. Cardiovascular: Heart sounds show Loranda Mastel regular rate, and rhythm.  Lungs: Clear to auscultation bilaterally  Abdomen: Soft, nontender, nondistended  Neurological: Alert and oriented 3. Moves all extremities 4. Cranial nerves II through XII grossly intact. Skin: Warm and dry. No rashes or lesions. Extremities: No clubbing or cyanosis. No edema.   Data Reviewed: I have personally reviewed following labs and imaging studies  CBC: Recent Labs  Lab 01/30/21 2010 01/30/21 2044 01/31/21 0222 02/01/21 0415 02/02/21 0425 02/03/21 0255 02/04/21 0155  WBC 7.5   < > 7.5 7.5 4.7 5.1 4.8  NEUTROABS 2.6  --   --   --   --  2.0  --   HGB 13.0   < > 11.6* 9.9* 9.7* 10.1* 9.8*  HCT 41.5   < > 36.7 30.5* 30.2* 31.6* 31.1*  MCV 92.4   < > 90.8 89.4 89.3 88.8 90.4  PLT 365   < > 301 262 262 278 306   < > = values in this interval  not displayed.    Basic Metabolic Panel: Recent Labs  Lab 01/31/21 0222 02/01/21 0415 02/02/21 0425 02/03/21 0255 02/04/21 0155  NA 134* 137 140 140 140  K 5.4* 3.7 3.5 3.6 3.8  CL 99 100 101 99 99  CO2 27 29 32 33* 34*  GLUCOSE 287* 71 92 100* 108*  BUN 34* 40* 37* 38* 48*  CREATININE 1.60* 1.89* 1.64* 1.68* 1.79*  CALCIUM 8.4* 8.5* 8.6* 8.7* 8.6*  MG 2.2  --   --  2.0  --   PHOS 4.4  --   --  3.7  --     GFR: Estimated Creatinine Clearance: 27.4 mL/min (Juliana Boling) (by C-G formula based on SCr of 1.79 mg/dL (H)).  Liver Function Tests: Recent Labs  Lab 01/30/21 2010 02/03/21 0255  AST 29 21  ALT 20 15  ALKPHOS 93 65  BILITOT 0.6 1.2  PROT 7.7 5.9*  ALBUMIN 3.6 2.8*    CBG: Recent Labs  Lab 02/03/21 1539 02/03/21 2049 02/04/21 0615 02/04/21 1155 02/04/21 1541  GLUCAP 196* 257* 121* 209* 258*     Recent Results (from the past 240 hour(s))  Resp Panel by RT-PCR (Flu Floye Fesler&B, Covid) Nasopharyngeal Swab     Status: None   Collection Time: 01/30/21  7:46 PM   Specimen: Nasopharyngeal Swab; Nasopharyngeal(NP) swabs in vial transport medium  Result Value Ref Range Status   SARS Coronavirus 2 by RT PCR NEGATIVE NEGATIVE Final    Comment: (NOTE) SARS-CoV-2 target nucleic acids are NOT DETECTED.  The SARS-CoV-2 RNA is generally detectable in upper respiratory specimens during the acute phase of infection. The lowest concentration of SARS-CoV-2 viral copies this assay can detect is 138 copies/mL. Marializ Ferrebee negative result does not preclude SARS-Cov-2 infection and should not be used as the sole basis for treatment or other patient management decisions. Linetta Regner negative result may occur with  improper specimen collection/handling, submission of specimen other than nasopharyngeal swab, presence of viral mutation(s) within the areas targeted by this assay, and inadequate number of viral copies(<138 copies/mL). Jaggar Benko negative result must be combined with clinical observations, patient  history, and epidemiological information. The expected result is Negative.  Fact Sheet for Patients:  EntrepreneurPulse.com.au  Fact Sheet for Healthcare Providers:  IncredibleEmployment.be  This test is no t yet approved or cleared by the [place] FDA and  has been authorized for detection and/or diagnosis of SARS-CoV-2 by FDA under an Emergency Use Authorization (EUA). This EUA will remain  in effect (meaning this test can be used) for the duration of the COVID-19 declaration under Section 564(b)(1) of the Act, 21 U.S.C.section 360bbb-3(b)(1), unless the authorization is terminated  or revoked sooner.       Influenza Carlton Sweaney by PCR NEGATIVE NEGATIVE Final   Influenza B by PCR NEGATIVE NEGATIVE Final    Comment: (NOTE) The Xpert Xpress SARS-CoV-2/FLU/RSV plus assay is intended as an aid in the diagnosis of influenza from Nasopharyngeal swab specimens and should not be used as Demetri Goshert sole basis for treatment. Nasal washings and aspirates are unacceptable for Xpert Xpress SARS-CoV-2/FLU/RSV testing.  Fact Sheet for Patients: EntrepreneurPulse.com.au  Fact Sheet for Healthcare Providers: IncredibleEmployment.be  This test is not yet approved or cleared by the [place] FDA and has been authorized for detection and/or diagnosis of SARS-CoV-2 by FDA under an Emergency Use Authorization (EUA). This EUA will remain in effect (meaning this test can be used) for the duration of the COVID-19 declaration under Section 564(b)(1) of the Act, 21 U.S.C. section 360bbb-3(b)(1), unless the authorization is terminated or revoked.  Performed at [facility], [place] [address] [place], [place] [address]   Culture, blood (routine x 2)     Status: None (Preliminary result)   Collection Time: 01/30/21  9:45 PM   Specimen: BLOOD RIGHT HAND  Result Value Ref Range Status   Specimen Description BLOOD RIGHT HAND  Final    Special Requests   Final    AEROBIC BOTTLE ONLY Blood Culture results may not be optimal due to an inadequate volume of blood received in culture bottles   Culture   Final    NO GROWTH 4 DAYS Performed at [facility], [place] [address], [place], [place] [address]    Report Status PENDING  Incomplete  Culture, blood (routine x 2)     Status: Abnormal   Collection Time: 01/30/21  9:50 PM  Specimen: BLOOD RIGHT HAND  Result Value Ref Range Status   Specimen Description BLOOD RIGHT HAND  Final   Special Requests AEROBIC BOTTLE ONLY Blood Culture adequate volume  Final   Culture  Setup Time   Final    AEROBIC BOTTLE ONLY GRAM POSITIVE COCCI CRITICAL RESULT CALLED TO, READ BACK BY AND VERIFIED WITH: J LEDFORD PHARMD 02/01/21 0238 JDW    Culture (Shayne Deerman)  Final    STAPHYLOCOCCUS EPIDERMIDIS THE SIGNIFICANCE OF ISOLATING THIS ORGANISM FROM Anairis Knick SINGLE SET OF BLOOD CULTURES WHEN MULTIPLE SETS ARE DRAWN IS UNCERTAIN. PLEASE NOTIFY THE MICROBIOLOGY DEPARTMENT WITHIN ONE WEEK IF SPECIATION AND SENSITIVITIES ARE REQUIRED. Performed at [facility], [place] [address], [place], [place] [address]    Report Status 02/02/2021 FINAL  Final  Blood Culture ID Panel (Reflexed)     Status: Abnormal   Collection Time: 01/30/21  9:50 PM  Result Value Ref Range Status   Enterococcus faecalis NOT DETECTED NOT DETECTED Final   Enterococcus Faecium NOT DETECTED NOT DETECTED Final   Listeria monocytogenes NOT DETECTED NOT DETECTED Final   Staphylococcus species DETECTED (Shayanna Thatch) NOT DETECTED Final    Comment: CRITICAL RESULT CALLED TO, READ BACK BY AND VERIFIED WITH: J LEDFORD PHARMD 02/01/21 0238 JDW    Staphylococcus aureus (BCID) NOT DETECTED NOT DETECTED Final   Staphylococcus epidermidis DETECTED (Larnce Schnackenberg) NOT DETECTED Final    Comment: Methicillin (oxacillin) resistant coagulase negative staphylococcus. Possible blood culture contaminant (unless isolated from more than one blood culture draw or clinical case suggests  pathogenicity). No antibiotic treatment is indicated for blood  culture contaminants. CRITICAL RESULT CALLED TO, READ BACK BY AND VERIFIED WITH: J LEDFORD [facility] 02/01/21 0238 JDW    Staphylococcus lugdunensis NOT DETECTED NOT DETECTED Final   Streptococcus species NOT DETECTED NOT DETECTED Final   Streptococcus agalactiae NOT DETECTED NOT DETECTED Final   Streptococcus pneumoniae NOT DETECTED NOT DETECTED Final   Streptococcus pyogenes NOT DETECTED NOT DETECTED Final   Gregg Winchell.calcoaceticus-baumannii NOT DETECTED NOT DETECTED Final   Bacteroides fragilis NOT DETECTED NOT DETECTED Final   Enterobacterales NOT DETECTED NOT DETECTED Final   Enterobacter cloacae complex NOT DETECTED NOT DETECTED Final   Escherichia coli NOT DETECTED NOT DETECTED Final   Klebsiella aerogenes NOT DETECTED NOT DETECTED Final   Klebsiella oxytoca NOT DETECTED NOT DETECTED Final   Klebsiella pneumoniae NOT DETECTED NOT DETECTED Final   Proteus species NOT DETECTED NOT DETECTED Final   Salmonella species NOT DETECTED NOT DETECTED Final   Serratia marcescens NOT DETECTED NOT DETECTED Final   Haemophilus influenzae NOT DETECTED NOT DETECTED Final   Neisseria meningitidis NOT DETECTED NOT DETECTED Final   Pseudomonas aeruginosa NOT DETECTED NOT DETECTED Final   Stenotrophomonas maltophilia NOT DETECTED NOT DETECTED Final   Candida albicans NOT DETECTED NOT DETECTED Final   Candida auris NOT DETECTED NOT DETECTED Final   Candida glabrata NOT DETECTED NOT DETECTED Final   Candida krusei NOT DETECTED NOT DETECTED Final   Candida parapsilosis NOT DETECTED NOT DETECTED Final   Candida tropicalis NOT DETECTED NOT DETECTED Final   Cryptococcus neoformans/gattii NOT DETECTED NOT DETECTED Final   Methicillin resistance mecA/C DETECTED (Adisa Vigeant) NOT DETECTED Final    Comment: CRITICAL RESULT CALLED TO, READ BACK BY AND VERIFIED WITHLenna Sciara [facility] 02/01/21 0238 JDW Performed at [facility], [address], [place],  [place] [address]   MRSA PCR Screening     Status: None   Collection Time: 01/31/21 12:36 AM   Specimen: Nasal  Mucosa; Nasopharyngeal  Result Value Ref Range Status   MRSA by PCR NEGATIVE NEGATIVE Final    Comment:        The GeneXpert MRSA Assay (FDA approved for NASAL specimens only), is one component of Sholanda Croson comprehensive MRSA colonization surveillance program. It is not intended to diagnose MRSA infection nor to guide or monitor treatment for MRSA infections. Performed at [facility], [place] [address], [place], [place] [address]   Expectorated Sputum Assessment w Gram Stain, Rflx to Resp Cult     Status: None   Collection Time: 02/02/21 10:30 PM   Specimen: Expectorated Sputum  Result Value Ref Range Status   Specimen Description EXPECTORATED SPUTUM  Final   Special Requests NONE  Final   Sputum evaluation   Final    THIS SPECIMEN IS ACCEPTABLE FOR SPUTUM CULTURE Performed at [facility], [place] [address] [place], [place] [address]    Report Status 02/03/2021 FINAL  Final  Culture, Respiratory w Gram Stain     Status: None (Preliminary result)   Collection Time: 02/02/21 10:30 PM  Result Value Ref Range Status   Specimen Description EXPECTORATED SPUTUM  Final   Special Requests NONE Reflexed from [id number]  Final   Gram Stain   Final    RARE WBC PRESENT,BOTH PMN AND MONONUCLEAR ABUNDANT GRAM POSITIVE COCCI IN PAIRS FEW GRAM NEGATIVE RODS    Culture   Final    CULTURE REINCUBATED FOR BETTER GROWTH Performed at [facility], [place] [address], [place], [place] [address]    Report Status PENDING  Incomplete         Radiology Studies: No results found.      Scheduled Meds: . ALPRAZolam  0.125 mg Oral q AM   And  . ALPRAZolam  0.25 mg Oral QHS  . aspirin  81 mg Oral Daily  . atorvastatin  40 mg Oral Daily  . carvedilol  6.25 mg Oral BID  . chlorhexidine  15 mL Mouth Rinse BID  . Chlorhexidine Gluconate Cloth  6 each Topical Daily  . clopidogrel  75 mg  Oral Daily  . docusate sodium  100 mg Oral BID  . hydrALAZINE  10 mg Oral Q8H  . insulin aspart  0-5 Units Subcutaneous QHS  . insulin aspart  0-9 Units Subcutaneous TID WC  . isosorbide mononitrate  15 mg Oral Daily  . mouth rinse  15 mL Mouth Rinse q12n4p  . pantoprazole  40 mg Oral QHS  . polyethylene glycol  17 g Oral Daily  . potassium chloride  20 mEq Oral Daily  . sodium chloride flush  3 mL Intravenous Q12H   Continuous Infusions: . sodium chloride    . heparin 1,350 Units/hr (02/04/21 0936)     LOS: 5 days    Time spent: over 30 min    Fayrene Helper, MD [facility]   To contact the attending provider between 7A-7P or the covering provider during after hours 7P-7A, please log into the web site [email or web address] and access using universal [place] password for that web site. If you do not have the password, please call the hospital operator.  02/04/2021, 4:45 PM

## 2021-02-04 NOTE — Progress Notes (Signed)
ANTICOAGULATION CONSULT NOTE - Follow Up Consult  Pharmacy Consult for Heparin Indication: atrial flutter and hx PE 2009  No Known Allergies  Patient Measurements: Height: '5\' 6"'$  (167.6 cm) Weight: 84 kg (185 lb 1.6 oz) IBW/kg (Calculated) : 59.3  Vital Signs: Temp: 97.7 F (36.5 C) (04/24 0408) Temp Source: Oral (04/24 0408) BP: 107/54 (04/24 0408) Pulse Rate: 65 (04/24 0408)  Labs: Recent Labs    02/02/21 0425 02/02/21 1142 02/03/21 0255 02/03/21 1156 02/04/21 0155  HGB 9.7*  --  10.1*  --  9.8*  HCT 30.2*  --  31.6*  --  31.1*  PLT 262  --  278  --  306  LABPROT 20.9*  --  21.6*  --  21.9*  INR 1.8*  --  1.9*  --  1.9*  HEPARINUNFRC  --   --  0.30 0.52 0.56  CREATININE 1.64*  --  1.68*  --  1.79*  TROPONINIHS  --  416*  --   --   --     Estimated Creatinine Clearance: 27.4 mL/min (A) (by C-G formula based on SCr of 1.79 mg/dL (H)).  Assessment: 29 YOF presented 01/30/21 with SOB, hx of PE and aflutter on warfarin PTA. INR subtherapeutic on admission at 1.7 and heparin drip begun while holding warfarin for possible procedures. Also on Plavix as PTA for hx multiple stents. PTA warfarin regimen: 5 mg daily. Last outpatient dose 4/19; warfarin was held on 01/31/21.   Cardiology planning cath on Monday for concern of ACS. Pharmacy consulted to transition from warfarin to heparin drip over the weekend while awaiting procedure. INR 1.9 this AM. CBC stable. No active bleed issues reported. Heparin level is therapeutic at 0.56   Goal of Therapy:  Heparin level 0.3-0.7 units/ml Monitor platelets by anticoagulation protocol: Yes   Plan:  Continue heparin at 1350 units/hr Daily HL and CBC Monitor for signs and symptoms of bleeding  Norina Buzzard, PharmD PGY1 Pharmacy Resident 02/04/2021 7:25 AM

## 2021-02-04 NOTE — Plan of Care (Signed)
  Problem: Education: Goal: Knowledge of General Education information will improve Description: Including pain rating scale, medication(s)/side effects and non-pharmacologic comfort measures Outcome: Progressing   Problem: Clinical Measurements: Goal: Cardiovascular complication will be avoided Outcome: Progressing   Problem: Nutrition: Goal: Adequate nutrition will be maintained Outcome: Progressing   Problem: Coping: Goal: Level of anxiety will decrease Outcome: Progressing   Problem: Elimination: Goal: Will not experience complications related to bowel motility Outcome: Progressing

## 2021-02-05 ENCOUNTER — Encounter (HOSPITAL_COMMUNITY): Payer: Self-pay | Admitting: Cardiovascular Disease

## 2021-02-05 ENCOUNTER — Encounter (HOSPITAL_COMMUNITY)
Admission: EM | Disposition: A | Discharge status: Home / Self Care | Payer: Self-pay | Source: Home / Self Care | Attending: Family Medicine

## 2021-02-05 DIAGNOSIS — I5023 Acute on chronic systolic (congestive) heart failure: Secondary | ICD-10-CM

## 2021-02-05 DIAGNOSIS — I2510 Atherosclerotic heart disease of native coronary artery without angina pectoris: Secondary | ICD-10-CM

## 2021-02-05 HISTORY — PX: RIGHT/LEFT HEART CATH AND CORONARY ANGIOGRAPHY: CATH118266

## 2021-02-05 LAB — POCT I-STAT 7, (LYTES, BLD GAS, ICA,H+H)
Acid-Base Excess: 6 mmol/L — ABNORMAL HIGH (ref 0.0–2.0)
Bicarbonate: 30.9 mmol/L — ABNORMAL HIGH (ref 20.0–28.0)
Calcium, Ion: 1.19 mmol/L (ref 1.15–1.40)
HCT: 27 % — ABNORMAL LOW (ref 36.0–46.0)
Hemoglobin: 9.2 g/dL — ABNORMAL LOW (ref 12.0–15.0)
O2 Saturation: 98 %
Potassium: 4.2 mmol/L (ref 3.5–5.1)
Sodium: 140 mmol/L (ref 135–145)
TCO2: 32 mmol/L (ref 22–32)
pCO2 arterial: 46.7 mmHg (ref 32.0–48.0)
pH, Arterial: 7.428 (ref 7.350–7.450)
pO2, Arterial: 109 mmHg — ABNORMAL HIGH (ref 83.0–108.0)

## 2021-02-05 LAB — BASIC METABOLIC PANEL
Anion gap: 9 (ref 5–15)
BUN: 49 mg/dL — ABNORMAL HIGH (ref 8–23)
CO2: 29 mmol/L (ref 22–32)
Calcium: 8.8 mg/dL — ABNORMAL LOW (ref 8.9–10.3)
Chloride: 103 mmol/L (ref 98–111)
Creatinine, Ser: 1.75 mg/dL — ABNORMAL HIGH (ref 0.44–1.00)
GFR, Estimated: 29 mL/min — ABNORMAL LOW (ref >60)
Glucose, Bld: 127 mg/dL — ABNORMAL HIGH (ref 70–99)
Potassium: 4 mmol/L (ref 3.5–5.1)
Sodium: 141 mmol/L (ref 135–145)

## 2021-02-05 LAB — POCT I-STAT EG7
Acid-Base Excess: 6 mmol/L — ABNORMAL HIGH (ref 0.0–2.0)
Acid-Base Excess: 6 mmol/L — ABNORMAL HIGH (ref 0.0–2.0)
Bicarbonate: 31.4 mmol/L — ABNORMAL HIGH (ref 20.0–28.0)
Bicarbonate: 31.9 mmol/L — ABNORMAL HIGH (ref 20.0–28.0)
Calcium, Ion: 1.19 mmol/L (ref 1.15–1.40)
Calcium, Ion: 1.21 mmol/L (ref 1.15–1.40)
HCT: 29 % — ABNORMAL LOW (ref 36.0–46.0)
HCT: 30 % — ABNORMAL LOW (ref 36.0–46.0)
Hemoglobin: 10.2 g/dL — ABNORMAL LOW (ref 12.0–15.0)
Hemoglobin: 9.9 g/dL — ABNORMAL LOW (ref 12.0–15.0)
O2 Saturation: 73 %
O2 Saturation: 74 %
Potassium: 4.3 mmol/L (ref 3.5–5.1)
Potassium: 4.3 mmol/L (ref 3.5–5.1)
Sodium: 140 mmol/L (ref 135–145)
Sodium: 141 mmol/L (ref 135–145)
TCO2: 33 mmol/L — ABNORMAL HIGH (ref 22–32)
TCO2: 33 mmol/L — ABNORMAL HIGH (ref 22–32)
pCO2, Ven: 48.8 mmHg (ref 44.0–60.0)
pCO2, Ven: 49.2 mmHg (ref 44.0–60.0)
pH, Ven: 7.416 (ref 7.250–7.430)
pH, Ven: 7.42 (ref 7.250–7.430)
pO2, Ven: 38 mmHg (ref 32.0–45.0)
pO2, Ven: 39 mmHg (ref 32.0–45.0)

## 2021-02-05 LAB — CBC
HCT: 31.3 % — ABNORMAL LOW (ref 36.0–46.0)
Hemoglobin: 9.9 g/dL — ABNORMAL LOW (ref 12.0–15.0)
MCH: 28.6 pg (ref 26.0–34.0)
MCHC: 31.6 g/dL (ref 30.0–36.0)
MCV: 90.5 fL (ref 80.0–100.0)
Platelets: 302 10*3/uL (ref 150–400)
RBC: 3.46 MIL/uL — ABNORMAL LOW (ref 3.87–5.11)
RDW: 14.4 % (ref 11.5–15.5)
WBC: 5.4 10*3/uL (ref 4.0–10.5)
nRBC: 0 % (ref 0.0–0.2)

## 2021-02-05 LAB — CULTURE, RESPIRATORY W GRAM STAIN: Culture: NORMAL

## 2021-02-05 LAB — CULTURE, BLOOD (ROUTINE X 2): Culture: NO GROWTH

## 2021-02-05 LAB — HEPARIN LEVEL (UNFRACTIONATED): Heparin Unfractionated: 0.69 IU/mL (ref 0.30–0.70)

## 2021-02-05 LAB — GLUCOSE, CAPILLARY
Glucose-Capillary: 117 mg/dL — ABNORMAL HIGH (ref 70–99)
Glucose-Capillary: 140 mg/dL — ABNORMAL HIGH (ref 70–99)
Glucose-Capillary: 171 mg/dL — ABNORMAL HIGH (ref 70–99)
Glucose-Capillary: 246 mg/dL — ABNORMAL HIGH (ref 70–99)

## 2021-02-05 LAB — PROTIME-INR
INR: 1.5 — ABNORMAL HIGH (ref 0.8–1.2)
Prothrombin Time: 18.5 seconds — ABNORMAL HIGH (ref 11.4–15.2)

## 2021-02-05 SURGERY — RIGHT/LEFT HEART CATH AND CORONARY ANGIOGRAPHY
Anesthesia: LOCAL

## 2021-02-05 MED ORDER — WARFARIN - PHARMACIST DOSING INPATIENT: Freq: Every day | Status: DC

## 2021-02-05 MED ORDER — LIDOCAINE HCL (PF) 1 % IJ SOLN
INTRAMUSCULAR | Status: DC | PRN
  Administered 2021-02-05: 20 mL

## 2021-02-05 MED ORDER — HEPARIN (PORCINE) IN NACL 1000-0.9 UT/500ML-% IV SOLN
INTRAVENOUS | Status: DC | PRN
  Administered 2021-02-05 (×2): 500 mL

## 2021-02-05 MED ORDER — GERHARDT'S BUTT CREAM
TOPICAL_CREAM | Freq: Two times a day (BID) | CUTANEOUS | Status: DC
  Filled 2021-02-05: qty 1

## 2021-02-05 MED ORDER — WARFARIN SODIUM 5 MG PO TABS: 5.0000 mg | ORAL_TABLET | Freq: Once | ORAL | Status: DC

## 2021-02-05 MED ORDER — NITROGLYCERIN 1 MG/10 ML FOR IR/CATH LAB
INTRA_ARTERIAL | Status: AC
  Filled 2021-02-05: qty 10

## 2021-02-05 MED ORDER — HEPARIN (PORCINE) 25000 UT/250ML-% IV SOLN: 1300.0000 [IU]/h | INTRAVENOUS | Status: DC

## 2021-02-05 MED ORDER — HEPARIN (PORCINE) IN NACL 1000-0.9 UT/500ML-% IV SOLN
INTRAVENOUS | Status: AC
  Filled 2021-02-05: qty 1000

## 2021-02-05 MED ORDER — SODIUM CHLORIDE 0.9 % IV SOLN: INTRAVENOUS | Status: DC

## 2021-02-05 MED ORDER — IOHEXOL 350 MG/ML SOLN
INTRAVENOUS | Status: DC | PRN
  Administered 2021-02-05: 40 mL

## 2021-02-05 MED ORDER — SODIUM CHLORIDE 0.9 % IV SOLN: 250.0000 mL | INTRAVENOUS | Status: DC | PRN

## 2021-02-05 MED ORDER — LIDOCAINE HCL (PF) 1 % IJ SOLN
INTRAMUSCULAR | Status: AC
  Filled 2021-02-05: qty 30

## 2021-02-05 MED ORDER — SODIUM CHLORIDE 0.9% FLUSH: 3.0000 mL | INTRAVENOUS | Status: DC | PRN

## 2021-02-05 SURGICAL SUPPLY — 12 items
CATH INFINITI 5FR MULTPACK ANG (CATHETERS) ×2 IMPLANT
CATH SWAN GANZ 7F STRAIGHT (CATHETERS) ×2 IMPLANT
CLOSURE MYNX CONTROL 5F (Vascular Products) ×2 IMPLANT
CLOSURE MYNX CONTROL 6F/7F (Vascular Products) ×2 IMPLANT
KIT HEART LEFT (KITS) ×2 IMPLANT
PACK CARDIAC CATHETERIZATION (CUSTOM PROCEDURE TRAY) ×2 IMPLANT
SHEATH PINNACLE 5F 10CM (SHEATH) ×2 IMPLANT
SHEATH PINNACLE 7F 10CM (SHEATH) ×2 IMPLANT
SHEATH PROBE COVER 6X72 (BAG) ×2 IMPLANT
TRANSDUCER W/STOPCOCK (MISCELLANEOUS) ×2 IMPLANT
WIRE EMERALD 3MM-J .035X150CM (WIRE) ×2 IMPLANT
WIRE EMERALD ST .035X150CM (WIRE) ×2 IMPLANT

## 2021-02-05 NOTE — Progress Notes (Signed)
ANTICOAGULATION CONSULT NOTE - Follow Up Consult  Pharmacy Consult for Heparin Indication: atrial flutter and hx PE 2009  No Known Allergies  Patient Measurements: Height: '5\' 6"'$  (167.6 cm) Weight: 83.3 kg (183 lb 9.6 oz) IBW/kg (Calculated) : 59.3  Vital Signs: Temp: 98.4 F (36.9 C) (04/25 0800) Temp Source: Oral (04/25 0800) BP: 139/50 (04/25 0800) Pulse Rate: 69 (04/25 0800)  Labs: Recent Labs    02/02/21 1142 02/03/21 0255 02/03/21 0255 02/03/21 1156 02/04/21 0155 02/05/21 0700  HGB  --  10.1*   < >  --  9.8* 9.9*  HCT  --  31.6*  --   --  31.1* 31.3*  PLT  --  278  --   --  306 302  LABPROT  --  21.6*  --   --  21.9* 18.5*  INR  --  1.9*  --   --  1.9* 1.5*  HEPARINUNFRC  --  0.30   < > 0.52 0.56 0.69  CREATININE  --  1.68*  --   --  1.79* 1.75*  TROPONINIHS 416*  --   --   --   --   --    < > = values in this interval not displayed.    Estimated Creatinine Clearance: 27.9 mL/min (A) (by C-G formula based on SCr of 1.75 mg/dL (H)).  Assessment: 67 YOF presented 01/30/21 with SOB, hx of PE and aflutter on warfarin PTA. INR subtherapeutic on admission at 1.7 and heparin drip begun while holding warfarin for possible procedures. Also on Plavix as PTA for hx multiple stents. PTA warfarin regimen: 5 mg daily. Last outpatient dose 4/19; warfarin was held on 01/31/21.   HL 0.69 up trending to upper limit of goal. H/H, plt stable. Possible heart cath today.   Goal of Therapy:  Heparin level 0.3-0.7 units/ml Monitor platelets by anticoagulation protocol: Yes   Plan:  Decrease heparin to 1300 units/hr Daily HL and CBC Monitor for signs and symptoms of bleeding F/u cath results.  Benetta Spar, PharmD, BCPS, [facility] Clinical Pharmacist  Please check AMION for all [place] phone numbers After 10:00 PM, call [place] [phone]

## 2021-02-05 NOTE — Progress Notes (Addendum)
ANTICOAGULATION CONSULT NOTE - Follow Up Consult  Pharmacy Consult for Heparin Indication: atrial flutter and hx PE 2009  No Known Allergies  Patient Measurements: Height: '5\' 6"'$  (167.6 cm) Weight: 83.3 kg (183 lb 9.6 oz) IBW/kg (Calculated) : 59.3  Vital Signs: Temp: 98.4 F (36.9 C) (04/25 1159) Temp Source: Oral (04/25 1159) BP: 150/62 (04/25 1159) Pulse Rate: 75 (04/25 1159)  Labs: Recent Labs    02/03/21 0255 02/03/21 1156 02/04/21 0155 02/05/21 0700  HGB 10.1*  --  9.8* 9.9*  HCT 31.6*  --  31.1* 31.3*  PLT 278  --  306 302  LABPROT 21.6*  --  21.9* 18.5*  INR 1.9*  --  1.9* 1.5*  HEPARINUNFRC 0.30 0.52 0.56 0.69  CREATININE 1.68*  --  1.79* 1.75*    Estimated Creatinine Clearance: 27.9 mL/min (A) (by C-G formula based on SCr of 1.75 mg/dL (H)).  Assessment: 54 YOF presented 01/30/21 with SOB, hx of PE and aflutter on warfarin PTA. INR subtherapeutic on admission at 1.7 and heparin drip begun while holding warfarin for procedures. Pharmacy consulted for heparin to start 6hr after sheath removal and warfarin dosing.   Patient currently post-cath and is bleeding from a PIV removal site, RN holding pressure. Femoral site is not bleeding. INR is 1.5. Of note, patient is now on aspirin and clopidogrel, prior to admission was not on aspirin.   PTA warfarin regimen: 5 mg daily. Last outpatient dose 4/19; warfarin was held on 01/31/21.    Goal of Therapy:  Heparin level 0.3-0.7 units/ml Monitor platelets by anticoagulation protocol: Yes   Plan:  Restart heparin at 1300 units/hr at 1800, no bolus  Start Warfarin tomorrow per MD Monitor daily INR, HL, CBC/plt Monitor for signs/symptoms of bleeding  Consider stopping aspirin given triple therapy  Benetta Spar, PharmD, BCPS, [place] Pharmacist  Please check AMION for all [place] phone numbers After 10:00 PM, call [place]

## 2021-02-05 NOTE — Progress Notes (Signed)
Progress Note  Patient Name: [name] Date of Encounter: 02/05/2021  [facility] Cardiologist: Rozann Lesches, MD   Subjective   No chest pain Discussed cath with patient and daughter she is familiar from cath done 07/14/20   Inpatient Medications    Scheduled Meds: . ALPRAZolam  0.125 mg Oral q AM   And  . ALPRAZolam  0.25 mg Oral QHS  . aspirin  81 mg Oral Daily  . atorvastatin  40 mg Oral Daily  . carvedilol  6.25 mg Oral BID  . chlorhexidine  15 mL Mouth Rinse BID  . Chlorhexidine Gluconate Cloth  6 each Topical Daily  . clopidogrel  75 mg Oral Daily  . docusate sodium  100 mg Oral BID  . hydrALAZINE  10 mg Oral Q8H  . insulin aspart  0-5 Units Subcutaneous QHS  . insulin aspart  0-9 Units Subcutaneous TID WC  . insulin aspart  2 Units Subcutaneous TID WC  . isosorbide mononitrate  15 mg Oral Daily  . mouth rinse  15 mL Mouth Rinse q12n4p  . pantoprazole  40 mg Oral QHS  . polyethylene glycol  17 g Oral Daily  . potassium chloride  20 mEq Oral Daily  . sodium chloride flush  3 mL Intravenous Q12H   Continuous Infusions: . sodium chloride Stopped (02/04/21 1800)  . sodium chloride    . sodium chloride 10 mL/hr at 02/05/21 [id number]  . heparin 1,350 Units/hr (02/05/21 0323)   PRN Meds: sodium chloride, acetaminophen, docusate sodium, ondansetron (ZOFRAN) IV, polyethylene glycol, sodium chloride flush   Vital Signs    Vitals:   02/05/21 0015 02/05/21 0500 02/05/21 0518 02/05/21 0800  BP:   (!) 153/58 (!) 139/50  Pulse:   76 69  Resp:   19   Temp:   98.6 F (37 C) 98.4 F (36.9 C)  TempSrc:   Oral Oral  SpO2:   93% 94%  Weight: 83.3 kg 83.3 kg    Height:        Intake/Output Summary (Last 24 hours) at 02/05/2021 0858 Last data filed at 02/05/2021 0800 Gross per 24 hour  Intake 1103.76 ml  Output 802 ml  Net 301.76 ml   Last 3 Weights 02/05/2021 02/05/2021 02/04/2021  Weight (lbs) 183 lb 9.6 oz 183 lb 9.6 oz 185 lb 1.6 oz  Weight (kg) 83.28 kg  83.28 kg 83.961 kg      Telemetry    Sinus rhythm with rare PVC- 02/05/2021    Physical Exam   Affect appropriate Healthy:  appears stated age 80: normal Neck supple with no adenopathy JVP normal no bruits no thyromegaly Lungs clear with no wheezing and good diaphragmatic motion Heart:  S1/S2 AS murmur, no rub, gallop or click PMI normal Abdomen: benighn, BS positve, no tenderness, no AAA no bruit.  No HSM or HJR Distal pulses intact with no bruits No edema Neuro non-focal Skin warm and dry No muscular weakness   Labs    High Sensitivity Troponin:   Recent Labs  Lab 01/30/21 2010 01/30/21 2202 02/02/21 1142  TROPONINIHS 23* 64* 416*      Chemistry Recent Labs  Lab 01/30/21 2010 01/30/21 2044 02/03/21 0255 02/04/21 0155 02/05/21 0700  NA 134*   < > 140 140 141  K 4.6   < > 3.6 3.8 4.0  CL 96*   < > 99 99 103  CO2 29   < > 33* 34* 29  GLUCOSE 302*   < >  100* 108* 127*  BUN 32*   < > 38* 48* 49*  CREATININE 1.65*   < > 1.68* 1.79* 1.75*  CALCIUM 8.9   < > 8.7* 8.6* 8.8*  PROT 7.7  --  5.9*  --   --   ALBUMIN 3.6  --  2.8*  --   --   AST 29  --  21  --   --   ALT 20  --  15  --   --   ALKPHOS 93  --  65  --   --   BILITOT 0.6  --  1.2  --   --   GFRNONAA 31*   < > 31* 28* 29*  ANIONGAP 9   < > '8 7 9   '$ < > = values in this interval not displayed.     Hematology Recent Labs  Lab 02/03/21 0255 02/04/21 0155 02/05/21 0700  WBC 5.1 4.8 5.4  RBC 3.56* 3.44* 3.46*  HGB 10.1* 9.8* 9.9*  HCT 31.6* 31.1* 31.3*  MCV 88.8 90.4 90.5  MCH 28.4 28.5 28.6  MCHC 32.0 31.5 31.6  RDW 14.0 14.2 14.4  PLT 278 306 302    BNP Recent Labs  Lab 01/30/21 2010  BNP 587.4*     Patient Profile     80 y.o. female with past medical history of coronary artery disease, ischemic cardiomyopathy, hyperlipidemia, aortic stenosis, prior CVA, diabetes mellitus, history of atrial flutter ablation, chronic stage III kidney disease, dementia, history of pulmonary emboli  being evaluated for possible non-STEMI and CHF.  Echocardiogram this admission shows ejection fraction 40 to 45%, severe left ventricular enlargement, grade 2 diastolic dysfunction, severe left atrial enlargement, mild mitral regurgitation, mild to moderate aortic stenosis with mean gradient 11 mmHg.  Assessment & Plan    1 acute pulmonary edema/non-nonST elevation myocardial infarction- Cr stable at baseline diuretic held for cath .   Continue aspirin, Plavix, beta-blocker, heparin and statin.  2 CHF:  EF 40-45% diuretic held for cath right cath with left today   3 cardiomyopathy-ischemic no ACE/ARB due to renal function   4 hypertension-Well controlled.  Continue current medications and low sodium Dash type diet.    5 HLD -continue statin.  6 AS:  Mild to moderate mean gradient only 11 mmHg AVA 1.3 cm2 in setting of EF 40-45%   7 CRF:  Cr 1.75 today near baseline diuretics held   8 PE:  Resume coumadin prior to d/c   For questions or updates, please contact [place] Please consult [email or web address] for contact info under        Signed, Jenkins Rouge, MD  02/05/2021, 8:58 AM

## 2021-02-05 NOTE — Interval H&P Note (Signed)
Cath Lab Visit (complete for each Cath Lab visit)  Clinical Evaluation Leading to the Procedure:   ACS: No.  Non-ACS:    Anginal Classification: No Symptoms  Anti-ischemic medical therapy: Maximal Therapy (2 or more classes of medications)  Non-Invasive Test Results: No non-invasive testing performed  Prior CABG: No previous CABG      History and Physical Interval Note:  02/05/2021 11:15 AM  [name]  has presented today for surgery, with the diagnosis of unstable angina.  The various methods of treatment have been discussed with the patient and family. After consideration of risks, benefits and other options for treatment, the patient has consented to  Procedure(s): LEFT HEART CATH AND CORONARY ANGIOGRAPHY (N/A) as a surgical intervention.  The patient's history has been reviewed, patient examined, no change in status, stable for surgery.  I have reviewed the patient's chart and labs.  Questions were answered to the patient's satisfaction.     Quay Burow

## 2021-02-05 NOTE — Progress Notes (Signed)
PROGRESS NOTE    [name]  [id number] DOB: 1941/01/17 DOA: 01/30/2021 PCP: Celene Squibb, MD   Chief Complaint  Patient presents with  . Shortness of Breath  . Code STEMI   Brief Narrative: 80 yo female with pmh afib on chronic coumadin, h/o pe's, h/o cva, hyperlipidemia, HTN, cad s/p DES to RCA 2006/2009/2021, HFpEF, dm2 who presented with progressive sob and hypoxic resp failure.  Per discussion with daughter, she notes SOB was sudden onset, no gradual progression.  On presentation, she was satting 78% and placed on NIV, but became unresponsive.  She was intubated for airway protection.  She's been diuresed over the past several days and has gradually improved.  She was transferred to [facility], [place] on 4/22.   Assessment & Plan:   Active Problems:   NSTEMI (non-ST elevated myocardial infarction) ([place])   Acute on chronic congestive heart failure [facility])   Hypertensive emergency  Acute Pulmonary Edema  NSTEMI  Hx CAD s/p DES In the setting of above, trop rising at presentation, continuing to rise today She's asymptomatic Continue plavix, aspirin, statin, coreg Cardiology c/s, appreciate recs - continue plavix, heparin  S/p cath 4/25 with patent LAD and RCA stents.  Moderate disease in [name] small ramus and nondominant circumflex.  Moderately elevated pulmonary capillary wedge pressure.  Suspect symptoms related to AS.  ? tavr candidate? Appreciate additional cardiology recs  Acute Hypoxic Respiratory Failure secondary to Acute Systolic and Diastolic Heart Failure Exacerbations Respiratory status improving - on RA today Diuresis per cardiology, on hold today with cath  Continue coreg.  Not on arb/entresto due to renal function. Hydral/nitrates per cards Cardiology c/s, appreciate recs Strict I/O, daily weights Echo with EF 40-45%, global hypokinesis, grade 2 diastolic dysfunction, mild to moderate aortic stenosis (see below)  Mild to Moderate Aortic Stenosis Needs follow up echoes  in the future  T2DM Hold glipizide Continue SSI for now Follow  Hx Atrial Fibrillation Heparin per pharmacy  Hx PE Heparin as noted above  CKD 3b Fluctuating, follow post cath  HLD Statin  GERD PPI  Concern for OSA Needs sleep study outpatient   Staph Epidermidis in 1/2 sets Likely contaminant, follow clinically  Pressure ulcer Pressure Injury 02/02/21 Coccyx Right;Left;Medial Stage 1 -  Intact skin with non-blanchable redness of Anaiyah [name] localized area usually over [name] bony prominence. (Active)  02/02/21 2000  Location: Coccyx  Location Orientation: Right;Left;Medial  Staging: Stage 1 -  Intact skin with non-blanchable redness of [name] localized area usually over [name] bony prominence.  Wound Description (Comments):   Present on Admission: Yes (per family)   DVT prophylaxis: heparin today, warfarin to start tomorrow Code Status: partial - no CPR, intubation ok Family Communication: daughter this AM at bedside Disposition:   Status is: Inpatient  Remains inpatient appropriate because:Inpatient level of care appropriate due to severity of illness   Dispo: The patient is from: Home              Anticipated d/c is to: Home              Patient currently is not medically stable to d/c.   Difficult to place patient No       Consultants:   PCCM  Procedures:  Echo IMPRESSIONS    1. Left ventricular ejection fraction, by estimation, is 40 to 45%. Left  ventricular ejection fraction by 3D volume is 42 %. The left ventricle has  mildly decreased function. The left ventricle demonstrates global  hypokinesis. The left  ventricular  internal cavity size was severely dilated. There is severe left  ventricular hypertrophy of the basal-septal segment. Left ventricular  diastolic parameters are consistent with Grade II diastolic dysfunction  (pseudonormalization). Elevated left ventricular  end-diastolic pressure. The average left ventricular global longitudinal  strain is  -9.1 %. The global longitudinal strain is abnormal.  2. Right ventricular systolic function is normal. The right ventricular  size is normal. There is mildly elevated pulmonary artery systolic  pressure. The estimated right ventricular systolic pressure is [id number] mmHg.  3. Left atrial size was severely dilated.  4. The mitral valve is degenerative. Mild mitral valve regurgitation.  Severe mitral annular calcification.The posterior MV leaflet appears fixed  and immobile.  5. The aortic valve is calcified. There is severe calcifcation of the  aortic valve. There is severe thickening of the aortic valve. Aortic valve  regurgitation is not visualized. Mild to moderate aortic valve stenosis.  Aortic valve mean gradient measures  11.0 mmHg. Aortic valve Vmax measures 2.16 m/s. In the setting of LV  dysfunction the AV gradient may be underestimated and likely is more on  the moderate AS side.  6. The inferior vena cava is normal in size with greater than 50%  respiratory variability, suggesting right atrial pressure of 3  mmHg.Compared to prior echo 2021, LVF appears similar, AV mean gradient  with no signfiicant change.   Antimicrobials:  Anti-infectives (From admission, onward)   None        Subjective: Feels ok after cath  Objective: Vitals:   02/05/21 1454 02/05/21 1502 02/05/21 1517 02/05/21 1548  BP: (!) 106/47 (!) 108/45 (!) 115/101 (!) 112/42  Pulse: 67 63 75 66  Resp:      Temp:      TempSrc:      SpO2: 93% 92% 92% (!) 75%  Weight:      Height:        Intake/Output Summary (Last 24 hours) at 02/05/2021 1558 Last data filed at 02/05/2021 1456 Gross per 24 hour  Intake 619.18 ml  Output 1101 ml  Net -481.82 ml   Filed Weights   02/04/21 0408 02/05/21 0015 02/05/21 0500  Weight: 84 kg 83.3 kg 83.3 kg    Examination:  General: No acute distress. Cardiovascular: Heart sounds show Newman Waren regular rate, and rhythm Lungs: Clear to auscultation bilaterally  Abdomen:  Soft, nontender, nondistended  Neurological: Alert and oriented 3. Moves all extremities 4 . Cranial nerves II through XII grossly intact. Skin: Warm and dry. No rashes or lesions. R groin dressing intact Extremities: No clubbing or cyanosis. No edema.   Data Reviewed: I have personally reviewed following labs and imaging studies  CBC: Recent Labs  Lab 01/30/21 2010 01/30/21 2044 02/01/21 0415 02/02/21 0425 02/03/21 0255 02/04/21 0155 02/05/21 0700  WBC 7.5   < > 7.5 4.7 5.1 4.8 5.4  NEUTROABS 2.6  --   --   --  2.0  --   --   HGB 13.0   < > 9.9* 9.7* 10.1* 9.8* 9.9*  HCT 41.5   < > 30.5* 30.2* 31.6* 31.1* 31.3*  MCV 92.4   < > 89.4 89.3 88.8 90.4 90.5  PLT 365   < > 262 262 278 306 302   < > = values in this interval not displayed.    Basic Metabolic Panel: Recent Labs  Lab 01/31/21 0222 02/01/21 0415 02/02/21 0425 02/03/21 0255 02/04/21 0155 02/05/21 0700  NA 134* 137 140 140 140 141  K  5.4* 3.7 3.5 3.6 3.8 4.0  CL 99 100 101 99 99 103  CO2 27 29 32 33* 34* 29  GLUCOSE 287* 71 92 100* 108* 127*  BUN 34* 40* 37* 38* 48* 49*  CREATININE 1.60* 1.89* 1.64* 1.68* 1.79* 1.75*  CALCIUM 8.4* 8.5* 8.6* 8.7* 8.6* 8.8*  MG 2.2  --   --  2.0  --   --   PHOS 4.4  --   --  3.7  --   --     GFR: Estimated Creatinine Clearance: 27.9 mL/min (Jahni Nazar) (by C-G formula based on SCr of 1.75 mg/dL (H)).  Liver Function Tests: Recent Labs  Lab 01/30/21 2010 02/03/21 0255  AST 29 21  ALT 20 15  ALKPHOS 93 65  BILITOT 0.6 1.2  PROT 7.7 5.9*  ALBUMIN 3.6 2.8*    CBG: Recent Labs  Lab 02/04/21 1155 02/04/21 1541 02/04/21 2107 02/05/21 0553 02/05/21 1207  GLUCAP 209* 258* 333* 117* 140*     Recent Results (from the past 240 hour(s))  Resp Panel by RT-PCR (Flu Easter Schinke&B, Covid) Nasopharyngeal Swab     Status: None   Collection Time: 01/30/21  7:46 PM   Specimen: Nasopharyngeal Swab; Nasopharyngeal(NP) swabs in vial transport medium  Result Value Ref Range Status   SARS  Coronavirus 2 by RT PCR NEGATIVE NEGATIVE Final    Comment: (NOTE) SARS-CoV-2 target nucleic acids are NOT DETECTED.  The SARS-CoV-2 RNA is generally detectable in upper respiratory specimens during the acute phase of infection. The lowest concentration of SARS-CoV-2 viral copies this assay can detect is 138 copies/mL. Julieann Drummonds negative result does not preclude SARS-Cov-2 infection and should not be used as the sole basis for treatment or other patient management decisions. Kenzel Ruesch negative result may occur with  improper specimen collection/handling, submission of specimen other than nasopharyngeal swab, presence of viral mutation(s) within the areas targeted by this assay, and inadequate number of viral copies(<138 copies/mL). Sharina Petre negative result must be combined with clinical observations, patient history, and epidemiological information. The expected result is Negative.  Fact Sheet for Patients:  EntrepreneurPulse.com.au  Fact Sheet for Healthcare Providers:  IncredibleEmployment.be  This test is no t yet approved or cleared by the [place] FDA and  has been authorized for detection and/or diagnosis of SARS-CoV-2 by FDA under an Emergency Use Authorization (EUA). This EUA will remain  in effect (meaning this test can be used) for the duration of the COVID-19 declaration under Section 564(b)(1) of the Act, 21 U.S.C.section 360bbb-3(b)(1), unless the authorization is terminated  or revoked sooner.       Influenza [place] by PCR NEGATIVE NEGATIVE Final   Influenza B by PCR NEGATIVE NEGATIVE Final    Comment: (NOTE) The Xpert Xpress SARS-CoV-2/FLU/RSV plus assay is intended as an aid in the diagnosis of influenza from Nasopharyngeal swab specimens and should not be used as Miela Desjardin sole basis for treatment. Nasal washings and aspirates are unacceptable for Xpert Xpress SARS-CoV-2/FLU/RSV testing.  Fact Sheet for  Patients: EntrepreneurPulse.com.au  Fact Sheet for Healthcare Providers: IncredibleEmployment.be  This test is not yet approved or cleared by the [place] FDA and has been authorized for detection and/or diagnosis of SARS-CoV-2 by FDA under an Emergency Use Authorization (EUA). This EUA will remain in effect (meaning this test can be used) for the duration of the COVID-19 declaration under Section 564(b)(1) of the Act, 21 U.S.C. section 360bbb-3(b)(1), unless the authorization is terminated or revoked.  Performed at [facility], [place]  [address], [place], [place] [address]   Culture, blood (routine x 2)     Status: None   Collection Time: 01/30/21  9:45 PM   Specimen: BLOOD RIGHT HAND  Result Value Ref Range Status   Specimen Description BLOOD RIGHT HAND  Final   Special Requests   Final    AEROBIC BOTTLE ONLY Blood Culture results may not be optimal due to an inadequate volume of blood received in culture bottles   Culture   Final    NO GROWTH 5 DAYS Performed at [facility], [place] [address], [place], [place] [address]    Report Status 02/05/2021 FINAL  Final  Culture, blood (routine x 2)     Status: Abnormal   Collection Time: 01/30/21  9:50 PM   Specimen: BLOOD RIGHT HAND  Result Value Ref Range Status   Specimen Description BLOOD RIGHT HAND  Final   Special Requests AEROBIC BOTTLE ONLY Blood Culture adequate volume  Final   Culture  Setup Time   Final    AEROBIC BOTTLE ONLY GRAM POSITIVE COCCI CRITICAL RESULT CALLED TO, READ BACK BY AND VERIFIED WITH: J LEDFORD PHARMD 02/01/21 0238 JDW    Culture (Alexes Lamarque)  Final    STAPHYLOCOCCUS EPIDERMIDIS THE SIGNIFICANCE OF ISOLATING THIS ORGANISM FROM Chelsi Warr SINGLE SET OF BLOOD CULTURES WHEN MULTIPLE SETS ARE DRAWN IS UNCERTAIN. PLEASE NOTIFY THE MICROBIOLOGY DEPARTMENT WITHIN ONE WEEK IF SPECIATION AND SENSITIVITIES ARE REQUIRED. Performed at [facility], [place] [address],  [place], [place] [address]    Report Status 02/02/2021 FINAL  Final  Blood Culture ID Panel (Reflexed)     Status: Abnormal   Collection Time: 01/30/21  9:50 PM  Result Value Ref Range Status   Enterococcus faecalis NOT DETECTED NOT DETECTED Final   Enterococcus Faecium NOT DETECTED NOT DETECTED Final   Listeria monocytogenes NOT DETECTED NOT DETECTED Final   Staphylococcus species DETECTED (Emmarose Klinke) NOT DETECTED Final    Comment: CRITICAL RESULT CALLED TO, READ BACK BY AND VERIFIED WITH: J LEDFORD PHARMD 02/01/21 0238 JDW    Staphylococcus aureus (BCID) NOT DETECTED NOT DETECTED Final   Staphylococcus epidermidis DETECTED (Kania Regnier) NOT DETECTED Final    Comment: Methicillin (oxacillin) resistant coagulase negative staphylococcus. Possible blood culture contaminant (unless isolated from more than one blood culture draw or clinical case suggests pathogenicity). No antibiotic treatment is indicated for blood  culture contaminants. CRITICAL RESULT CALLED TO, READ BACK BY AND VERIFIED WITH: J LEDFORD [facility] 02/01/21 0238 JDW    Staphylococcus lugdunensis NOT DETECTED NOT DETECTED Final   Streptococcus species NOT DETECTED NOT DETECTED Final   Streptococcus agalactiae NOT DETECTED NOT DETECTED Final   Streptococcus pneumoniae NOT DETECTED NOT DETECTED Final   Streptococcus pyogenes NOT DETECTED NOT DETECTED Final   Anzley Dibbern.calcoaceticus-baumannii NOT DETECTED NOT DETECTED Final   Bacteroides fragilis NOT DETECTED NOT DETECTED Final   Enterobacterales NOT DETECTED NOT DETECTED Final   Enterobacter cloacae complex NOT DETECTED NOT DETECTED Final   Escherichia coli NOT DETECTED NOT DETECTED Final   Klebsiella aerogenes NOT DETECTED NOT DETECTED Final   Klebsiella oxytoca NOT DETECTED NOT DETECTED Final   Klebsiella pneumoniae NOT DETECTED NOT DETECTED Final   Proteus species NOT DETECTED NOT DETECTED Final   Salmonella species NOT DETECTED NOT DETECTED Final   Serratia marcescens NOT DETECTED NOT DETECTED Final    Haemophilus influenzae NOT DETECTED NOT DETECTED Final   Neisseria meningitidis NOT DETECTED NOT DETECTED Final   Pseudomonas aeruginosa NOT DETECTED NOT DETECTED Final   Stenotrophomonas  maltophilia NOT DETECTED NOT DETECTED Final   Candida albicans NOT DETECTED NOT DETECTED Final   Candida auris NOT DETECTED NOT DETECTED Final   Candida glabrata NOT DETECTED NOT DETECTED Final   Candida krusei NOT DETECTED NOT DETECTED Final   Candida parapsilosis NOT DETECTED NOT DETECTED Final   Candida tropicalis NOT DETECTED NOT DETECTED Final   Cryptococcus neoformans/gattii NOT DETECTED NOT DETECTED Final   Methicillin resistance mecA/C DETECTED (Shandell Giovanni) NOT DETECTED Final    Comment: CRITICAL RESULT CALLED TO, READ BACK BY AND VERIFIED WITHLenna Sciara [facility] 02/01/21 0238 JDW Performed at [facility], [address], [place], [place] [address]   MRSA PCR Screening     Status: None   Collection Time: 01/31/21 12:36 AM   Specimen: Nasal Mucosa; Nasopharyngeal  Result Value Ref Range Status   MRSA by PCR NEGATIVE NEGATIVE Final    Comment:        The GeneXpert MRSA Assay (FDA approved for NASAL specimens only), is one component of Emmarie Sannes comprehensive MRSA colonization surveillance program. It is not intended to diagnose MRSA infection nor to guide or monitor treatment for MRSA infections. Performed at [facility], [place] [address] [place], [place] [address]   Expectorated Sputum Assessment w Gram Stain, Rflx to Resp Cult     Status: None   Collection Time: 02/02/21 10:30 PM   Specimen: Expectorated Sputum  Result Value Ref Range Status   Specimen Description EXPECTORATED SPUTUM  Final   Special Requests NONE  Final   Sputum evaluation   Final    THIS SPECIMEN IS ACCEPTABLE FOR SPUTUM CULTURE Performed at [facility], [place] [address], [place], [place] [address]    Report Status 02/03/2021 FINAL  Final  Culture, Respiratory w Gram Stain     Status: None   Collection Time:  02/02/21 10:30 PM  Result Value Ref Range Status   Specimen Description EXPECTORATED SPUTUM  Final   Special Requests NONE Reflexed from [id number]  Final   Gram Stain   Final    RARE WBC PRESENT,BOTH PMN AND MONONUCLEAR ABUNDANT GRAM POSITIVE COCCI IN PAIRS FEW GRAM NEGATIVE RODS    Culture   Final    RARE Normal respiratory flora-no Staph aureus or Pseudomonas seen Performed at [facility], [place] [address] [place], [place] [address]    Report Status 02/05/2021 FINAL  Final         Radiology Studies: CARDIAC CATHETERIZATION  Result Date: 02/05/2021  Previously placed Prox RCA to Dist RCA stent (unknown type) is widely patent.  Previously placed Mid LAD stent (unknown type) is widely patent.  1st Diag lesion is 75% stenosed.  Lat Ramus lesion is 99% stenosed.  Ramus lesion is 50% stenosed.  Ost Cx to Prox Cx lesion is 50% stenosed.  Mid LM to Dist LM lesion is 30% stenosed.  Hemodynamic findings consistent with aortic valve stenosis.  ARLENA HORSFORD is Ronte Parker 80 y.o. female  [id number] LOCATION:  FACILITY: [facility] PHYSICIAN: Quay Burow, M.D. 1940/12/02 DATE OF PROCEDURE:  02/05/2021 DATE OF DISCHARGE: CARDIAC CATHETERIZATION History obtained from chart review.80 y.o. female with past medical history of coronary artery disease, ischemic cardiomyopathy, hyperlipidemia, aortic stenosis, prior CVA, diabetes mellitus, history of atrial flutter ablation, chronic stage III kidney disease, dementia, history of pulmonary emboli being evaluated for possible non-STEMI and CHF.  Echocardiogram this admission shows ejection fraction 40 to 45%, severe left ventricular enlargement, grade 2 diastolic dysfunction, severe left atrial enlargement, mild  mitral regurgitation, mild to moderate aortic stenosis with mean gradient 11 mmHg.  Her enzymes were mildly elevated.  She had nonspecific ST and T wave changes.  She presents now for right and left heart cath to define her anatomy and physiology.  She did  have an RCA stent placed by Dr. Burt Knack late 2021.   Ms. Neisen's LAD and RCA stents are widely patent.  She has moderate disease in Lateefah Mallery small ramus and nondominant circumflex.  Her pulmonary capillary wedge pressure was moderately elevated.  I believe her symptoms are related to her aortic stenosis which I suspect is more severe than the ultrasound indicates probably from "low output" aortic stenosis.  She may be Shilah Hefel candidate for TAVR however she does have moderate renal insufficiency and cognitive impairment.  Ashon Rosenberg right common femoral angiogram was performed and [name] closure devices were successfully deployed in the right common femoral artery and vein achieving hemostasis.  The patient left lab in stable condition. Quay Burow. MD, [facility] 02/05/2021 12:01 PM        Scheduled Meds: . ALPRAZolam  0.125 mg Oral q AM   And  . ALPRAZolam  0.25 mg Oral QHS  . aspirin  81 mg Oral Daily  . atorvastatin  40 mg Oral Daily  . carvedilol  6.25 mg Oral BID  . chlorhexidine  15 mL Mouth Rinse BID  . Chlorhexidine Gluconate Cloth  6 each Topical Daily  . clopidogrel  75 mg Oral Daily  . docusate sodium  100 mg Oral BID  . Gerhardt's butt cream   Topical BID  . hydrALAZINE  10 mg Oral Q8H  . insulin aspart  0-5 Units Subcutaneous QHS  . insulin aspart  0-9 Units Subcutaneous TID WC  . insulin aspart  2 Units Subcutaneous TID WC  . isosorbide mononitrate  15 mg Oral Daily  . mouth rinse  15 mL Mouth Rinse q12n4p  . pantoprazole  40 mg Oral QHS  . polyethylene glycol  17 g Oral Daily  . potassium chloride  20 mEq Oral Daily  . sodium chloride flush  3 mL Intravenous Q12H  . Warfarin - Pharmacist Dosing Inpatient   Does not apply q1600   Continuous Infusions: . sodium chloride Stopped (02/04/21 1800)  . heparin       LOS: 6 days    Time spent: over 30 min    Fayrene Helper, MD [facility]   To contact the attending provider between 7A-7P or the covering provider during after  hours 7P-7A, please log into the web site [email or web address] and access using universal [place] password for that web site. If you do not have the password, please call the hospital operator.  02/05/2021, 3:58 PM

## 2021-02-05 NOTE — H&P (View-Only) (Signed)
Progress Note  Patient Name: [name] Date of Encounter: 02/05/2021  [facility] Cardiologist: Rozann Lesches, MD   Subjective   No chest pain Discussed cath with patient and daughter she is familiar from cath done 07/14/20   Inpatient Medications    Scheduled Meds: . ALPRAZolam  0.125 mg Oral q AM   And  . ALPRAZolam  0.25 mg Oral QHS  . aspirin  81 mg Oral Daily  . atorvastatin  40 mg Oral Daily  . carvedilol  6.25 mg Oral BID  . chlorhexidine  15 mL Mouth Rinse BID  . Chlorhexidine Gluconate Cloth  6 each Topical Daily  . clopidogrel  75 mg Oral Daily  . docusate sodium  100 mg Oral BID  . hydrALAZINE  10 mg Oral Q8H  . insulin aspart  0-5 Units Subcutaneous QHS  . insulin aspart  0-9 Units Subcutaneous TID WC  . insulin aspart  2 Units Subcutaneous TID WC  . isosorbide mononitrate  15 mg Oral Daily  . mouth rinse  15 mL Mouth Rinse q12n4p  . pantoprazole  40 mg Oral QHS  . polyethylene glycol  17 g Oral Daily  . potassium chloride  20 mEq Oral Daily  . sodium chloride flush  3 mL Intravenous Q12H   Continuous Infusions: . sodium chloride Stopped (02/04/21 1800)  . sodium chloride    . sodium chloride 10 mL/hr at 02/05/21 [id number]  . heparin 1,350 Units/hr (02/05/21 0323)   PRN Meds: sodium chloride, acetaminophen, docusate sodium, ondansetron (ZOFRAN) IV, polyethylene glycol, sodium chloride flush   Vital Signs    Vitals:   02/05/21 0015 02/05/21 0500 02/05/21 0518 02/05/21 0800  BP:   (!) 153/58 (!) 139/50  Pulse:   76 69  Resp:   19   Temp:   98.6 F (37 C) 98.4 F (36.9 C)  TempSrc:   Oral Oral  SpO2:   93% 94%  Weight: 83.3 kg 83.3 kg    Height:        Intake/Output Summary (Last 24 hours) at 02/05/2021 0858 Last data filed at 02/05/2021 0800 Gross per 24 hour  Intake 1103.76 ml  Output 802 ml  Net 301.76 ml   Last 3 Weights 02/05/2021 02/05/2021 02/04/2021  Weight (lbs) 183 lb 9.6 oz 183 lb 9.6 oz 185 lb 1.6 oz  Weight (kg) 83.28 kg  83.28 kg 83.961 kg      Telemetry    Sinus rhythm with rare PVC- 02/05/2021    Physical Exam   Affect appropriate Healthy:  appears stated age 80: normal Neck supple with no adenopathy JVP normal no bruits no thyromegaly Lungs clear with no wheezing and good diaphragmatic motion Heart:  S1/S2 AS murmur, no rub, gallop or click PMI normal Abdomen: benighn, BS positve, no tenderness, no AAA no bruit.  No HSM or HJR Distal pulses intact with no bruits No edema Neuro non-focal Skin warm and dry No muscular weakness   Labs    High Sensitivity Troponin:   Recent Labs  Lab 01/30/21 2010 01/30/21 2202 02/02/21 1142  TROPONINIHS 23* 64* 416*      Chemistry Recent Labs  Lab 01/30/21 2010 01/30/21 2044 02/03/21 0255 02/04/21 0155 02/05/21 0700  NA 134*   < > 140 140 141  K 4.6   < > 3.6 3.8 4.0  CL 96*   < > 99 99 103  CO2 29   < > 33* 34* 29  GLUCOSE 302*   < >  100* 108* 127*  BUN 32*   < > 38* 48* 49*  CREATININE 1.65*   < > 1.68* 1.79* 1.75*  CALCIUM 8.9   < > 8.7* 8.6* 8.8*  PROT 7.7  --  5.9*  --   --   ALBUMIN 3.6  --  2.8*  --   --   AST 29  --  21  --   --   ALT 20  --  15  --   --   ALKPHOS 93  --  65  --   --   BILITOT 0.6  --  1.2  --   --   GFRNONAA 31*   < > 31* 28* 29*  ANIONGAP 9   < > '8 7 9   '$ < > = values in this interval not displayed.     Hematology Recent Labs  Lab 02/03/21 0255 02/04/21 0155 02/05/21 0700  WBC 5.1 4.8 5.4  RBC 3.56* 3.44* 3.46*  HGB 10.1* 9.8* 9.9*  HCT 31.6* 31.1* 31.3*  MCV 88.8 90.4 90.5  MCH 28.4 28.5 28.6  MCHC 32.0 31.5 31.6  RDW 14.0 14.2 14.4  PLT 278 306 302    BNP Recent Labs  Lab 01/30/21 2010  BNP 587.4*     Patient Profile     80 y.o. female with past medical history of coronary artery disease, ischemic cardiomyopathy, hyperlipidemia, aortic stenosis, prior CVA, diabetes mellitus, history of atrial flutter ablation, chronic stage III kidney disease, dementia, history of pulmonary emboli  being evaluated for possible non-STEMI and CHF.  Echocardiogram this admission shows ejection fraction 40 to 45%, severe left ventricular enlargement, grade 2 diastolic dysfunction, severe left atrial enlargement, mild mitral regurgitation, mild to moderate aortic stenosis with mean gradient 11 mmHg.  Assessment & Plan    1 acute pulmonary edema/non-nonST elevation myocardial infarction- Cr stable at baseline diuretic held for cath .   Continue aspirin, Plavix, beta-blocker, heparin and statin.  2 CHF:  EF 40-45% diuretic held for cath right cath with left today   3 cardiomyopathy-ischemic no ACE/ARB due to renal function   4 hypertension-Well controlled.  Continue current medications and low sodium Dash type diet.    5 HLD -continue statin.  6 AS:  Mild to moderate mean gradient only 11 mmHg AVA 1.3 cm2 in setting of EF 40-45%   7 CRF:  Cr 1.75 today near baseline diuretics held   8 PE:  Resume coumadin prior to d/c   For questions or updates, please contact [facility] Please consult [email or web address] for contact info under        Signed, Jenkins Rouge, MD  02/05/2021, 8:58 AM

## 2021-02-05 NOTE — Consult Note (Signed)
[facility] Nurse wound consult note Consultation was completed by review of records, images and assistance from the bedside nurse/clinical staff.   Reason for Consult: pressure injury Wound type: verified with staff Stage 1 Pressure injury Pressure Injury POA: Yes Measurement: see nursing flowsheet Wound [id number] red non blanchable skin  Drainage (amount, consistency, odor) none Periwound: intact  Dressing procedure/placement/frequency: Continue silicone foam per skin care order set; change every 3 days.   Discussed POC with patient and bedside nurse.  Re consult if needed, will not follow at this time. Thanks  Aaron Boeh [employer], RN,CWOCN, CNS, [place] [phone])

## 2021-02-06 ENCOUNTER — Telehealth: Payer: Self-pay | Admitting: Cardiology

## 2021-02-06 DIAGNOSIS — I350 Nonrheumatic aortic (valve) stenosis: Secondary | ICD-10-CM

## 2021-02-06 DIAGNOSIS — I5030 Unspecified diastolic (congestive) heart failure: Secondary | ICD-10-CM

## 2021-02-06 LAB — CBC
HCT: 28.9 % — ABNORMAL LOW (ref 36.0–46.0)
Hemoglobin: 9.2 g/dL — ABNORMAL LOW (ref 12.0–15.0)
MCH: 28.8 pg (ref 26.0–34.0)
MCHC: 31.8 g/dL (ref 30.0–36.0)
MCV: 90.3 fL (ref 80.0–100.0)
Platelets: 290 10*3/uL (ref 150–400)
RBC: 3.2 MIL/uL — ABNORMAL LOW (ref 3.87–5.11)
RDW: 14.2 % (ref 11.5–15.5)
WBC: 5.9 10*3/uL (ref 4.0–10.5)
nRBC: 0 % (ref 0.0–0.2)

## 2021-02-06 LAB — COMPREHENSIVE METABOLIC PANEL
ALT: 19 U/L (ref 0–44)
AST: 22 U/L (ref 15–41)
Albumin: 2.7 g/dL — ABNORMAL LOW (ref 3.5–5.0)
Alkaline Phosphatase: 53 U/L (ref 38–126)
Anion gap: 8 (ref 5–15)
BUN: 50 mg/dL — ABNORMAL HIGH (ref 8–23)
CO2: 28 mmol/L (ref 22–32)
Calcium: 8.7 mg/dL — ABNORMAL LOW (ref 8.9–10.3)
Chloride: 106 mmol/L (ref 98–111)
Creatinine, Ser: 1.9 mg/dL — ABNORMAL HIGH (ref 0.44–1.00)
GFR, Estimated: 26 mL/min — ABNORMAL LOW (ref >60)
Glucose, Bld: 121 mg/dL — ABNORMAL HIGH (ref 70–99)
Potassium: 4.2 mmol/L (ref 3.5–5.1)
Sodium: 142 mmol/L (ref 135–145)
Total Bilirubin: 1.4 mg/dL — ABNORMAL HIGH (ref 0.3–1.2)
Total Protein: 5.6 g/dL — ABNORMAL LOW (ref 6.5–8.1)

## 2021-02-06 LAB — HEPARIN LEVEL (UNFRACTIONATED): Heparin Unfractionated: 0.27 IU/mL — ABNORMAL LOW (ref 0.30–0.70)

## 2021-02-06 LAB — GLUCOSE, CAPILLARY
Glucose-Capillary: 118 mg/dL — ABNORMAL HIGH (ref 70–99)
Glucose-Capillary: 238 mg/dL — ABNORMAL HIGH (ref 70–99)
Glucose-Capillary: 157 mg/dL — ABNORMAL HIGH (ref 70–99)

## 2021-02-06 LAB — MAGNESIUM: Magnesium: 2 mg/dL (ref 1.7–2.4)

## 2021-02-06 LAB — PROTIME-INR
INR: 1.3 — ABNORMAL HIGH (ref 0.8–1.2)
Prothrombin Time: 16.5 seconds — ABNORMAL HIGH (ref 11.4–15.2)

## 2021-02-06 LAB — PHOSPHORUS: Phosphorus: 3.9 mg/dL (ref 2.5–4.6)

## 2021-02-06 MED ORDER — ISOSORBIDE MONONITRATE ER 30 MG PO TB24: 15.0000 mg | ORAL_TABLET | Freq: Every day | ORAL | 0 refills | Status: DC

## 2021-02-06 MED ORDER — ATORVASTATIN CALCIUM 40 MG PO TABS: 40.0000 mg | ORAL_TABLET | Freq: Every day | ORAL | 1 refills | Status: DC

## 2021-02-06 MED ORDER — FUROSEMIDE 20 MG PO TABS: 20.0000 mg | ORAL_TABLET | Freq: Every day | ORAL | 3 refills | Status: DC

## 2021-02-06 MED ORDER — WARFARIN SODIUM 5 MG PO TABS
5.0000 mg | ORAL_TABLET | Freq: Once | ORAL | Status: AC
  Administered 2021-02-06: 5 mg via ORAL
  Filled 2021-02-06: qty 1

## 2021-02-06 MED ORDER — HEPARIN (PORCINE) 25000 UT/250ML-% IV SOLN
1300.0000 [IU]/h | INTRAVENOUS | Status: DC
  Administered 2021-02-06: 1300 [IU]/h via INTRAVENOUS

## 2021-02-06 MED ORDER — HYDRALAZINE HCL 10 MG PO TABS: 10.0000 mg | ORAL_TABLET | Freq: Three times a day (TID) | ORAL | 0 refills | Status: DC

## 2021-02-06 NOTE — Progress Notes (Signed)
Progress Note  Patient Name: [name] Date of Encounter: 02/06/2021  [facility] Cardiologist: Rozann Lesches, MD   Subjective   No complaints she has a dry cough   Inpatient Medications    Scheduled Meds: . ALPRAZolam  0.125 mg Oral q AM   And  . ALPRAZolam  0.25 mg Oral QHS  . aspirin  81 mg Oral Daily  . atorvastatin  40 mg Oral Daily  . carvedilol  6.25 mg Oral BID  . chlorhexidine  15 mL Mouth Rinse BID  . Chlorhexidine Gluconate Cloth  6 each Topical Daily  . clopidogrel  75 mg Oral Daily  . docusate sodium  100 mg Oral BID  . Gerhardt's butt cream   Topical BID  . hydrALAZINE  10 mg Oral Q8H  . insulin aspart  0-5 Units Subcutaneous QHS  . insulin aspart  0-9 Units Subcutaneous TID WC  . insulin aspart  2 Units Subcutaneous TID WC  . isosorbide mononitrate  15 mg Oral Daily  . mouth rinse  15 mL Mouth Rinse q12n4p  . pantoprazole  40 mg Oral QHS  . polyethylene glycol  17 g Oral Daily  . potassium chloride  20 mEq Oral Daily  . sodium chloride flush  3 mL Intravenous Q12H  . Warfarin - Pharmacist Dosing Inpatient   Does not apply q1600   Continuous Infusions: . sodium chloride Stopped (02/04/21 1800)  . heparin 1,300 Units/hr (02/05/21 1815)   PRN Meds: acetaminophen, docusate sodium, ondansetron (ZOFRAN) IV, polyethylene glycol   Vital Signs    Vitals:   02/05/21 1830 02/05/21 1900 02/05/21 1919 02/06/21 0323  BP: (!) 124/58 105/77 (!) 134/52 (!) 126/55  Pulse: 81 83 85 76  Resp:   16 18  Temp:   98.5 F (36.9 C) 98.6 F (37 C)  TempSrc:   Oral Oral  SpO2:   96% 96%  Weight:    84.4 kg  Height:        Intake/Output Summary (Last 24 hours) at 02/06/2021 [id number] Last data filed at 02/06/2021 0815 Gross per 24 hour  Intake 871.59 ml  Output 650 ml  Net 221.59 ml   Last 3 Weights 02/06/2021 02/05/2021 02/05/2021  Weight (lbs) 186 lb 1.6 oz 183 lb 9.6 oz 183 lb 9.6 oz  Weight (kg) 84.414 kg 83.28 kg 83.28 kg      Telemetry    Sinus  rhythm with rare PVC- 02/06/2021    Physical Exam   Affect appropriate Healthy:  appears stated age 80: normal Neck supple with no adenopathy JVP normal no bruits no thyromegaly Lungs clear with no wheezing and good diaphragmatic motion Heart:  S1/S2 AS murmur, no rub, gallop or click PMI normal Abdomen: benighn, BS positve, no tenderness, no AAA no bruit.  No HSM or HJR Distal pulses intact with no bruits No edema RFA cath site A with [name] closure    Labs    High Sensitivity Troponin:   Recent Labs  Lab 01/30/21 2010 01/30/21 2202 02/02/21 1142  TROPONINIHS 23* 64* 416*      Chemistry Recent Labs  Lab 01/30/21 2010 01/30/21 2044 02/03/21 0255 02/04/21 0155 02/05/21 0700 02/05/21 1129 02/05/21 1134 02/06/21 0352  NA 134*   < > 140 140 141 141  140 140 142  K 4.6   < > 3.6 3.8 4.0 4.3  4.3 4.2 4.2  CL 96*   < > 99 99 103  --   --  106  CO2  29   < > 33* 34* 29  --   --  28  GLUCOSE 302*   < > 100* 108* 127*  --   --  121*  BUN 32*   < > 38* 48* 49*  --   --  50*  CREATININE 1.65*   < > 1.68* 1.79* 1.75*  --   --  1.90*  CALCIUM 8.9   < > 8.7* 8.6* 8.8*  --   --  8.7*  PROT 7.7  --  5.9*  --   --   --   --  5.6*  ALBUMIN 3.6  --  2.8*  --   --   --   --  2.7*  AST 29  --  21  --   --   --   --  22  ALT 20  --  15  --   --   --   --  19  ALKPHOS 93  --  65  --   --   --   --  53  BILITOT 0.6  --  1.2  --   --   --   --  1.4*  GFRNONAA 31*   < > 31* 28* 29*  --   --  26*  ANIONGAP 9   < > '8 7 9  '$ --   --  8   < > = values in this interval not displayed.     Hematology Recent Labs  Lab 02/04/21 0155 02/05/21 0700 02/05/21 1129 02/05/21 1134 02/06/21 0352  WBC 4.8 5.4  --   --  5.9  RBC 3.44* 3.46*  --   --  3.20*  HGB 9.8* 9.9* 9.9*  10.2* 9.2* 9.2*  HCT 31.1* 31.3* 29.0*  30.0* 27.0* 28.9*  MCV 90.4 90.5  --   --  90.3  MCH 28.5 28.6  --   --  28.8  MCHC 31.5 31.6  --   --  31.8  RDW 14.2 14.4  --   --  14.2  PLT 306 302  --   --  290     BNP Recent Labs  Lab 01/30/21 2010  BNP 587.4*     Patient Profile     80 y.o. female with past medical history of coronary artery disease, ischemic cardiomyopathy, hyperlipidemia, aortic stenosis, prior CVA, diabetes mellitus, history of atrial flutter ablation, chronic stage III kidney disease, dementia, history of pulmonary emboli being evaluated for possible non-STEMI and CHF.  Echocardiogram this admission shows ejection fraction 40 to 45%, severe left ventricular enlargement, grade 2 diastolic dysfunction, severe left atrial enlargement, mild mitral regurgitation, mild to moderate aortic stenosis with mean gradient 11 mmHg.  Assessment & Plan    1 acute pulmonary edema/non-nonST elevation myocardial infarction- cath with no culprit [place] patient stents to RCA and LAD medical Rx   2 CHF:  EF 40-45%  Euvolemic resume diuretics post cath   3 cardiomyopathy-ischemic no ACE/ARB due to renal function   4 hypertension-Well controlled.  Continue current medications and low sodium Dash type diet.    5 HLD -continue statin.  6 AS:  Mild to moderate mean gradient only 11 mmHg AVA 1.3 cm2 in setting of EF 40-45%  Could not be crossed at cath but morphologically on echo does not look severe F/U echo  In 6 months no TAVR evaluation needed now   7 CRF:  Cr 1.9 post cath about baseline   8 PE:  Resume coumadin groin  looks good  And was closed with [name] I think she can be d/c with no bridging And have INR rise over 3-4 days  Will arrange outpatient f/u with Dr Domenic Polite with echo in 6 months   For questions or updates, please contact [facility] Please consult [email or web address] for contact info under        Signed, Jenkins Rouge, MD  02/06/2021, 8:22 AM

## 2021-02-06 NOTE — Progress Notes (Addendum)
PT Cancellation Note  Patient Details Name: [name] MRN: [id number] DOB: [birth year]   Cancelled Treatment:    Reason Eval/Treat Not Completed: PT screened, no needs identified, will sign off Pt previously seen and evaluated and discharged from PT services on 02/01/21. PT was reordered on 4/26. Spoke with OT who reports pt is at a mod I level and has no skilled PT needs. Will sign off. If needs change, please re-consult.   Reuel Derby, PT, DPT  Acute Rehabilitation Services  Pager: [phone] Office: [phone]    Rudean Hitt 02/06/2021, 11:59 AM

## 2021-02-06 NOTE — Telephone Encounter (Signed)
Spoke with daughter who also ask that Dr. Domenic Polite review chart and give insight on how he feels her mom will do at home in regards to quality of life. Daughter is unsure when pt will get out of the hospital so she is concerned that pt will not be out of the hospital in time for the visit. Please advise.

## 2021-02-06 NOTE — Discharge Summary (Signed)
Physician Discharge Summary  [name] [id number] DOB: 1940-11-06 DOA: 01/30/2021  PCP: Caitlin Squibb, MD  Admit date: 01/30/2021 Discharge date: 02/06/2021  Time spent: 40 minutes  Recommendations for Outpatient Follow-up:  1. Follow outpatient CBC/CMP 2. Creatinine up on day of discharge, holding lasix x48 hrs, then resuming --- Follow repeat labs outpatient - follow up volume status/creatinine/lytes - adjust regimen as needed 3. Resume warfarin outpatient, continue plavix - follow outpatient for INR checks 4. Needs follow up outpatient for aortic stenosis, per cardiology - needs follow up echo in 6 months 5. Consider outpatient sleep study  6. Follow CXR in 2-3 weeks  Discharge Diagnoses:  Active Problems:   NSTEMI (non-ST elevated myocardial infarction) ([place])   Acute on chronic congestive heart failure [facility])   Hypertensive emergency   Discharge Condition: stable  Diet recommendation: heart healthy  Filed Weights   02/05/21 0015 02/05/21 0500 02/06/21 0323  Weight: 83.3 kg 83.3 kg 84.4 kg    History of present illness:  80 yo female with pmh afib on chronic coumadin, h/o pe's, h/o cva, hyperlipidemia, HTN, cad s/p DES to RCA 2006/2009/2021, HFpEF, dm2 who presented with progressive sob and hypoxic resp failure.  Per discussion with daughter, she notes SOB was sudden onset, no gradual progression.  On presentation, she was satting 78% and placed on NIV, but became unresponsive.  She was intubated for airway protection.  She's been diuresed over the past several days and has gradually improved.  She was transferred to [facility] on 4/22.  She was noted to have an elevated troponin and concern for for NSTEMI precipitating pulmonary edema.  Cardiology was c/s and she's now s/p cath without any culprit lesions noted.  Concern that symptoms maybe related to aortic stenosis, being followed by cards.  Stable on 4/26 for discharge.  See below for additional details  Hospital Course:   Acute Pulmonary Edema  NSTEMI  Hx CAD s/p DES Concern for NSTEMI precipitating pulm edema Discharge on plavix, statin, coreg (and warfarin) Cardiology c/s, appreciate recs -  S/p cath 4/25 with patent LAD and RCA stents.  Moderate disease in [name] small ramus and nondominant circumflex.  Moderately elevated pulmonary capillary wedge pressure.  Suspect symptoms related to AS.  ? tavr candidate?  Cardiology recommending outpatient follow up for aortic stenosis.  Needs follow up echo in 6 months.  Appreciate additional cardiology recs  Acute Hypoxic Respiratory Failure secondary to Acute Systolic and Diastolic Heart Failure Exacerbations  Respiratory status improving - on RA today Creatinine up today, will hold lasix x48 hrs, then resume (discussed with cards)- follow outpatient with cardiology, needs follow up labs  Continue coreg.  Not on arb/entresto due to renal function. Hydral/nitrates per cards Cardiology c/s, appreciate recs Strict I/O, daily weights Echo with EF 40-45%, global hypokinesis, grade 2 diastolic dysfunction, mild to moderate aortic stenosis (see below)  Mild to Moderate Aortic Stenosis Needs follow up echoes in the future Follow up echo in 6 months - concern that this may have been precipitant per cath report, follow outpatient   T2DM Resume home meds  Hx Atrial Fibrillation Resume warfarin   Hx PE Resume warfarin   CKD 3b Baseline 1.5-1.7.  1.9 today, post cath.  Adequate UOP. Hold lasix x48 hrs then resume, needs follow up labs     HLD Statin  GERD PPI  Concern for OSA Needs sleep study outpatient   Staph Epidermidis in 1/2 sets Likely contaminant, follow clinically  Pressure ulcer Pressure Injury 02/02/21  Coccyx Right;Left;Medial Stage 1 -  Intact skin with non-blanchable redness of [name] localized area usually over [name] bony prominence. (Active)  02/02/21 2000  Location: Coccyx  Location Orientation: Right;Left;Medial  Staging: Stage 1 -   Intact skin with non-blanchable redness of [name] localized area usually over [name] bony prominence.  Wound Description (Comments):   Present on Admission: Yes (per family)  Wound care, frequent care, mobilization outpatient  Procedures: Cath History obtained from chart review.80 y.o.femalewith past medical history of coronary artery disease, ischemic cardiomyopathy, hyperlipidemia, aortic stenosis, prior CVA, diabetes mellitus, history of atrial flutter ablation, chronic stage III kidney disease, dementia, history of pulmonary emboli being evaluated for possible non-STEMI and CHF. Echocardiogram this admission shows ejection fraction 40 to 45%, severe left ventricular enlargement, grade 2 diastolic dysfunction, severe left atrial enlargement, mild mitral regurgitation, mild to moderate aortic stenosis with mean gradient 11 mmHg.  Her enzymes were mildly elevated.  She had nonspecific ST and T wave changes.  She presents now for right and left heart cath to define her anatomy and physiology.  She did have an RCA stent placed by [name] late 2021.   IMPRESSION: [name]'s LAD and RCA stents are widely patent.  She has moderate disease in [name] small ramus and nondominant circumflex.  Her pulmonary capillary wedge pressure was moderately elevated.  I believe her symptoms are related to her aortic stenosis which I suspect is more severe than the ultrasound indicates probably from "low output" aortic stenosis.  She may be [name] candidate for TAVR however she does have moderate renal insufficiency and cognitive impairment.  [name] right common femoral angiogram was performed and [name] closure devices were successfully deployed in the right common femoral artery and vein achieving hemostasis.  The patient left lab in stable condition.  Echo IMPRESSIONS    1. Left ventricular ejection fraction, by estimation, is 40 to 45%. Left  ventricular ejection fraction by 3D volume is 42 %. The left ventricle has  mildly  decreased function. The left ventricle demonstrates global  hypokinesis. The left ventricular  internal cavity size was severely dilated. There is severe left  ventricular hypertrophy of the basal-septal segment. Left ventricular  diastolic parameters are consistent with Grade II diastolic dysfunction  (pseudonormalization). Elevated left ventricular  end-diastolic pressure. The average left ventricular global longitudinal  strain is -9.1 %. The global longitudinal strain is abnormal.  2. Right ventricular systolic function is normal. The right ventricular  size is normal. There is mildly elevated pulmonary artery systolic  pressure. The estimated right ventricular systolic pressure is Q000111Q mmHg.  3. Left atrial size was severely dilated.  4. The mitral valve is degenerative. Mild mitral valve regurgitation.  Severe mitral annular calcification.The posterior MV leaflet appears fixed  and immobile.  5. The aortic valve is calcified. There is severe calcifcation of the  aortic valve. There is severe thickening of the aortic valve. Aortic valve  regurgitation is not visualized. Mild to moderate aortic valve stenosis.  Aortic valve mean gradient measures  11.0 mmHg. Aortic valve Vmax measures 2.16 m/s. In the setting of LV  dysfunction the AV gradient may be underestimated and likely is more on  the moderate AS side.  6. The inferior vena cava is normal in size with greater than 50%  respiratory variability, suggesting right atrial pressure of 3  mmHg.Compared to prior echo 2021, LVF appears similar, AV mean gradient  with no signfiicant change.   Intubation/extubation  Consultations:  Cardiology  PCCM  Discharge  Exam: Vitals:   02/06/21 0834 02/06/21 1137  BP: (!) 172/60 120/69  Pulse: 81 66  Resp: 18 18  Temp: 98.6 F (37 C) 98.4 F (36.9 C)  SpO2: 98% 92%   No complaints Feels well Discussed d/c plan with duaghter  General: No acute distress. Cardiovascular:  Heart sounds show Donne Baley regular rate, and rhythm. Lungs: Clear to auscultation bilaterally  Abdomen: Soft, nontender, nondistended Neurological: Alert and oriented 3. Moves all extremities 4. Cranial nerves II through XII grossly intact. Skin: Warm and dry. No rashes or lesions. Extremities: No clubbing or cyanosis. No edema.    Discharge Instructions   Discharge Instructions    Avoid straining   Complete by: As directed    Diet - low sodium heart healthy   Complete by: As directed    Discharge instructions   Complete by: As directed    You were seen for pulmonary edema (fluid on the lungs) that was the result of an NSTEMI (heart attack).  You had Jala Dundon catheterization done which showed that your stents looked good.  There's Treydon Henricks question of whether your aortic stenosis (narrowing of aortic valve) contributed to your presentation.  Continue your warfarin and plavix.  We've increased your atorvastatin to 40 mg daily.  We've started you on hydralazine and imdur.  Your kidney function is Yeiren Whitecotton little down, hold your lasix for 2 days (today and tomorrow) and resume on Thursday (4/28).  Weight yourself daily.  If your weight increases more than 2-3 lbs in Thurmond Hildebran day, while you're holding your lasix, go ahead and resume your lasix.  In general, if your weight increases more than 2-3 lbs in [place] Souter day (or 5 lbs in Susan Bleich week) call your doctor about your lasix dosing.  Follow up with labs to check your kidney function and electrolytes within Meya Clutter week.  You'll need an INR check as well.  Return for new, recurrent, or worsening symptoms.  Please ask your PCP to request records from this hospitalization so they know what was done and what the next steps will be.   For home use only DME Walker rolling   Complete by: As directed    Walker: With 5 Inch Wheels   Patient needs Mubarak Bevens walker to treat with the following condition: Physical deconditioning   Heart Failure patients record your daily weight using the same scale at the  same time of day   Complete by: As directed    Increase activity slowly   Complete by: As directed    STOP any activity that causes chest pain, shortness of breath, dizziness, sweating, or exessive weakness   Complete by: As directed      Allergies as of 02/06/2021   No Known Allergies     Medication List    TAKE these medications   albuterol 108 (90 Base) MCG/ACT inhaler Commonly known as: VENTOLIN HFA Inhale 2 puffs into the lungs every 6 (six) hours as needed for wheezing or shortness of breath.   ALPRAZolam 0.25 MG tablet Commonly known as: XANAX Take 0.125-0.25 mg by mouth See admin instructions. 0.125 in the morning. 0.25 mg at bedtime   atorvastatin 40 MG tablet Commonly known as: LIPITOR Take 1 tablet (40 mg total) by mouth daily. Start taking on: February 07, 2021 What changed:   medication strength  how much to take  when to take this   b complex vitamins capsule Take 1 capsule by mouth daily.   benzonatate 100 MG capsule Commonly known as: TESSALON Take  100 mg by mouth 3 (three) times daily as needed for cough.   carvedilol 6.25 MG tablet Commonly known as: COREG Take 1 tablet (6.25 mg total) by mouth 2 (two) times daily.   cholecalciferol 25 MCG (1000 UNIT) tablet Commonly known as: VITAMIN D3 Take 1,000 Units by mouth daily.   clopidogrel 75 MG tablet Commonly known as: PLAVIX Take 1 tablet by mouth once daily with breakfast   Co Q 10 100 MG Caps Take 100 mg by mouth daily.   furosemide 20 MG tablet Commonly known as: LASIX Take 1 tablet (20 mg total) by mouth daily. Start taking on: February 08, 2021 What changed: These instructions start on February 08, 2021. If you are unsure what to do until then, ask your doctor or other care provider.   glipiZIDE 5 MG 24 hr tablet Commonly known as: GLUCOTROL XL Take 5 mg by mouth every evening.   guaiFENesin 600 MG 12 hr tablet Commonly known as: MUCINEX Take 600 mg by mouth 2 (two) times daily as needed  for cough (congestion).   guaiFENesin-dextromethorphan 100-10 MG/5ML syrup Commonly known as: ROBITUSSIN DM Take 5 mLs by mouth every 4 (four) hours as needed for cough.   hydrALAZINE 10 MG tablet Commonly known as: APRESOLINE Take 1 tablet (10 mg total) by mouth every 8 (eight) hours.   isosorbide mononitrate 30 MG 24 hr tablet Commonly known as: IMDUR Take 0.5 tablets (15 mg total) by mouth daily. Start taking on: February 07, 2021   MEGARED OMEGA-3 KRILL OIL PO Take 1,000 mg by mouth daily. When she remembers   nitroGLYCERIN 0.4 MG SL tablet Commonly known as: NITROSTAT Place 1 tablet (0.4 mg total) under the tongue every 5 (five) minutes x 3 doses as needed for chest pain.   SYSTANE OP Apply 1 drop to eye 3 (three) times daily as needed (dry eyes).   VITAMIN C GUMMIES PO Take 2 each by mouth daily. 180 mg   vitamin E 180 MG (400 UNITS) capsule Take 400 Units by mouth daily.   warfarin 5 MG tablet Commonly known as: COUMADIN Take as directed. If you are unsure how to take this medication, talk to your nurse or doctor. Original instructions: Take 5 mg by mouth every evening.            Durable Medical Equipment  (From admission, onward)         Start     Ordered   02/06/21 0000  For home use only DME Walker rolling       Question Answer Comment  Walker: With 5 Inch Wheels   Patient needs Argelio Granier walker to treat with the following condition Physical deconditioning      02/06/21 1327         No Known Allergies    The results of significant diagnostics from this hospitalization (including imaging, microbiology, ancillary and laboratory) are listed below for reference.    Significant Diagnostic Studies: CARDIAC CATHETERIZATION  Result Date: 02/05/2021  Previously placed Prox RCA to Dist RCA stent (unknown type) is widely patent.  Previously placed Mid LAD stent (unknown type) is widely patent.  1st Diag lesion is 75% stenosed.  Lat Ramus lesion is 99%  stenosed.  Ramus lesion is 50% stenosed.  Ost Cx to Prox Cx lesion is 50% stenosed.  Mid LM to Dist LM lesion is 30% stenosed.  Hemodynamic findings consistent with aortic valve stenosis.  MADELEY ZAPALAC is Ryota Treece 80 y.o. female  MS:[id number] LOCATION:  FACILITY: [facility] PHYSICIAN: Quay Burow, M.D. [birth year] DATE OF PROCEDURE:  02/05/2021 DATE OF DISCHARGE: CARDIAC CATHETERIZATION History obtained from chart review.80 y.o. female with past medical history of coronary artery disease, ischemic cardiomyopathy, hyperlipidemia, aortic stenosis, prior CVA, diabetes mellitus, history of atrial flutter ablation, chronic stage III kidney disease, dementia, history of pulmonary emboli being evaluated for possible non-STEMI and CHF.  Echocardiogram this admission shows ejection fraction 40 to 45%, severe left ventricular enlargement, grade 2 diastolic dysfunction, severe left atrial enlargement, mild mitral regurgitation, mild to moderate aortic stenosis with mean gradient 11 mmHg.  Her enzymes were mildly elevated.  She had nonspecific ST and T wave changes.  She presents now for right and left heart cath to define her anatomy and physiology.  She did have an RCA stent placed by [name] late 2021.   Ms. Verbrugge's LAD and RCA stents are widely patent.  She has moderate disease in Moni Rothrock small ramus and nondominant circumflex.  Her pulmonary capillary wedge pressure was moderately elevated.  I believe her symptoms are related to her aortic stenosis which I suspect is more severe than the ultrasound indicates probably from "low output" aortic stenosis.  She may be Anola Mcgough candidate for TAVR however she does have moderate renal insufficiency and cognitive impairment.  Nathalya Wolanski right common femoral angiogram was performed and [name] closure devices were successfully deployed in the right common femoral artery and vein achieving hemostasis.  The patient left lab in stable condition. Quay Burow. MD, [facility] 02/05/2021 12:01 PM   DG CHEST PORT 1  VIEW  Result Date: 02/01/2021 CLINICAL DATA:  Cough EXAM: PORTABLE CHEST 1 VIEW COMPARISON:  01/31/2021 FINDINGS: Cardiomegaly, vascular congestion. Perihilar opacities and interstitial prominence, likely edema. No effusions. No acute bony abnormality. IMPRESSION: Cardiomegaly with vascular congestion and probable mild interstitial edema. Electronically Signed   By: Rolm Baptise M.D.   On: 02/01/2021 20:24   DG Chest Port 1 View  Result Date: 01/31/2021 CLINICAL DATA:  Hypoxia. EXAM: PORTABLE CHEST 1 VIEW COMPARISON:  01/30/2021. FINDINGS: Endotracheal tube and NG tube in stable position. Stable cardiomegaly. Multifocal bilateral pulmonary infiltrates/edema again noted without interim change. Atelectatic changes right upper lobe. Small bilateral pleural effusions. No pneumothorax. IMPRESSION: 1.  Lines and tubes in stable position. 2.  Stable cardiomegaly. 3. Multifocal bilateral pulmonary infiltrates/edema again noted without interim change. Atelectatic changes right upper lobe. Small bilateral pleural effusions. Electronically Signed   By: Marcello Moores  Register   On: 01/31/2021 06:05   DG Chest Portable 1 View  Result Date: 01/30/2021 CLINICAL DATA:  Intubation. EXAM: PORTABLE CHEST 1 VIEW COMPARISON:  Chest x-ray 11/10/2020 FINDINGS: The endotracheal tube is 5 cm above the carina. The NG tube is coursing down the esophagus and into the stomach. External pacer paddles are noted. Bilateral airspace opacities most significant in the right upper lobe suggesting multifocal pneumonia. No pleural effusions or pneumothorax. IMPRESSION: 1. Endotracheal tube and NG tubes in good position. 2. Bilateral airspace opacities suggesting multifocal pneumonia. Electronically Signed   By: Marijo Sanes M.D.   On: 01/30/2021 20:23   ECHOCARDIOGRAM COMPLETE  Result Date: 01/31/2021    ECHOCARDIOGRAM REPORT   Patient Name:   ELIZZIE FLAHERTY Date of Exam: 01/31/2021 Medical Rec #:  [id number]        Height: Accession #:     [id number]       Weight: Date of Birth:  [birth year]        BSA: Patient Age:    89 years  BP:           121/48 mmHg Patient Gender: F                HR:           78 bpm. Exam Location:  Inpatient Procedure: 2D Echo, 3D Echo, Cardiac Doppler and Color Doppler Indications:    Congestive Heart Failure I50.9  History:        Patient has prior history of Echocardiogram examinations. CAD,                 Stroke; Risk Factors:Diabetes, Dyslipidemia and Hypertension.                 Ischemic cardiomyopathy. On BiPap.  Sonographer:    Darlina Sicilian RDCS Referring Phys: [id number] Robinson MARSHALL  Sonographer Comments: Global longitudinal strain was attempted. IMPRESSIONS  1. Left ventricular ejection fraction, by estimation, is 40 to 45%. Left ventricular ejection fraction by 3D volume is 42 %. The left ventricle has mildly decreased function. The left ventricle demonstrates global hypokinesis. The left ventricular internal cavity size was severely dilated. There is severe left ventricular hypertrophy of the basal-septal segment. Left ventricular diastolic parameters are consistent with Grade II diastolic dysfunction (pseudonormalization). Elevated left ventricular  end-diastolic pressure. The average left ventricular global longitudinal strain is -9.1 %. The global longitudinal strain is abnormal.  2. Right ventricular systolic function is normal. The right ventricular size is normal. There is mildly elevated pulmonary artery systolic pressure. The estimated right ventricular systolic pressure is Q000111Q mmHg.  3. Left atrial size was severely dilated.  4. The mitral valve is degenerative. Mild mitral valve regurgitation. Severe mitral annular calcification.The posterior MV leaflet appears fixed and immobile.  5. The aortic valve is calcified. There is severe calcifcation of the aortic valve. There is severe thickening of the aortic valve. Aortic valve regurgitation is not visualized. Mild to moderate aortic valve  stenosis. Aortic valve mean gradient measures  11.0 mmHg. Aortic valve Vmax measures 2.16 m/s. In the setting of LV dysfunction the AV gradient may be underestimated and likely is more on the moderate AS side.  6. The inferior vena cava is normal in size with greater than 50% respiratory variability, suggesting right atrial pressure of 3 mmHg.Compared to prior echo 2021, LVF appears similar, AV mean gradient with no signfiicant change. FINDINGS  Left Ventricle: Left ventricular ejection fraction, by estimation, is 40 to 45%. Left ventricular ejection fraction by 3D volume is 42 %. The left ventricle has mildly decreased function. The left ventricle demonstrates global hypokinesis. The average left ventricular global longitudinal strain is -9.1 %. The global longitudinal strain is abnormal. The left ventricular internal cavity size was severely dilated. There is severe left ventricular hypertrophy of the basal-septal segment. Left ventricular diastolic parameters are consistent with Grade II diastolic dysfunction (pseudonormalization). Elevated left ventricular end-diastolic pressure. Right Ventricle: The right ventricular size is normal. No increase in right ventricular wall thickness. Right ventricular systolic function is normal. There is mildly elevated pulmonary artery systolic pressure. The tricuspid regurgitant velocity is 2.92  m/s, and with an assumed right atrial pressure of 3 mmHg, the estimated right ventricular systolic pressure is Q000111Q mmHg. Left Atrium: Left atrial size was severely dilated. Right Atrium: Right atrial size was normal in size. Pericardium: There is no evidence of pericardial effusion. Mitral Valve: The mitral valve is degenerative in appearance. Severely decreased mobility of the mitral valve leaflets. Severe mitral annular calcification. Mild mitral valve regurgitation. Tricuspid  Valve: The tricuspid valve is normal in structure. Tricuspid valve regurgitation is trivial. No evidence of  tricuspid stenosis. Aortic Valve: The aortic valve is calcified. There is severe calcifcation of the aortic valve. There is severe thickening of the aortic valve. Aortic valve regurgitation is not visualized. Mild to moderate aortic stenosis is present. Aortic valve mean gradient measures 11.0 mmHg. Aortic valve peak gradient measures 18.7 mmHg. Aortic valve area, by VTI measures 1.10 cm. Pulmonic Valve: The pulmonic valve was normal in structure. Pulmonic valve regurgitation is trivial. No evidence of pulmonic stenosis. Aorta: The aortic root is normal in size and structure. Venous: The inferior vena cava is normal in size with greater than 50% respiratory variability, suggesting right atrial pressure of 3 mmHg. IAS/Shunts: No atrial level shunt detected by color flow Doppler.  LEFT VENTRICLE PLAX 2D LVIDd:         6.30 cm         Diastology LVIDs:         4.50 cm         LV e' medial:    3.50 cm/s LV PW:         1.10 cm         LV E/e' medial:  38.3 LV IVS:        1.10 cm         LV e' lateral:   3.80 cm/s LVOT diam:     2.10 cm         LV E/e' lateral: 35.3 LV SV:         62 LVOT Area:     3.46 cm        2D                                Longitudinal                                Strain                                2D Strain GLS  -7.4 %                                (A2C):                                2D Strain GLS  -10.1 %                                (A3C):                                2D Strain GLS  -9.7 %                                (A4C):                                2D Strain GLS  -9.1 %  Avg:                                 3D Volume EF                                LV 3D EF:    Left                                             ventricular                                             ejection                                             fraction by                                             3D volume                                             is 42 %.                                  3D Volume EF:                                3D EF:        42 % LEFT ATRIUM            RIGHT ATRIUM LA diam:      4.80 cm  RA Volume:   24.00 ml LA Vol (A2C): 71.6 ml LA Vol (A4C): 107.0 ml  AORTIC VALVE AV Area (Vmax):    1.27 cm AV Area (VTI):     1.10 cm AV Vmax:           216.00 cm/s AV VTI:            0.563 m AV Peak Grad:      18.7 mmHg AV Mean Grad:      11.0 mmHg LVOT Vmax:         79.00 cm/s LVOT Vmean:        55.400 cm/s LVOT VTI:          0.178 m LVOT/AV VTI ratio: 0.32 MV E velocity: 134.00 cm/s  TRICUSPID VALVE MV Tawnee Clegg velocity: 72.20 cm/s   TR Peak grad:   34.1 mmHg MV E/Breken Nazari ratio:  1.86         TR Vmax:        292.00 cm/s  SHUNTS                             Systemic VTI:  0.18 m                             Systemic Diam: 2.10 cm Fransico Him MD Electronically signed by Fransico Him MD Signature Date/Time: 01/31/2021/10:40:41 AM    Final     Microbiology: Recent Results (from the past 240 hour(s))  Resp Panel by RT-PCR (Flu Keanen Dohse&B, Covid) Nasopharyngeal Swab     Status: None   Collection Time: 01/30/21  7:46 PM   Specimen: Nasopharyngeal Swab; Nasopharyngeal(NP) swabs in vial transport medium  Result Value Ref Range Status   SARS Coronavirus 2 by RT PCR NEGATIVE NEGATIVE Final    Comment: (NOTE) SARS-CoV-2 target nucleic acids are NOT DETECTED.  The SARS-CoV-2 RNA is generally detectable in upper respiratory specimens during the acute phase of infection. The lowest concentration of SARS-CoV-2 viral copies this assay can detect is 138 copies/mL. Jaqua Ching negative result does not preclude SARS-Cov-2 infection and should not be used as the sole basis for treatment or other patient management decisions. Ajani Schnieders negative result may occur with  improper specimen collection/handling, submission of specimen other than nasopharyngeal swab, presence of viral mutation(s) within the areas targeted by this assay, and inadequate number of viral copies(<138  copies/mL). Deionna Marcantonio negative result must be combined with clinical observations, patient history, and epidemiological information. The expected result is Negative.  Fact Sheet for Patients:  EntrepreneurPulse.com.au  Fact Sheet for Healthcare Providers:  IncredibleEmployment.be  This test is no t yet approved or cleared by the [place] FDA and  has been authorized for detection and/or diagnosis of SARS-CoV-2 by FDA under an Emergency Use Authorization (EUA). This EUA will remain  in effect (meaning this test can be used) for the duration of the COVID-19 declaration under Section 564(b)(1) of the Act, 21 U.S.C.section 360bbb-3(b)(1), unless the authorization is terminated  or revoked sooner.       Influenza Jalesha Plotz by PCR NEGATIVE NEGATIVE Final   Influenza B by PCR NEGATIVE NEGATIVE Final    Comment: (NOTE) The Xpert Xpress SARS-CoV-2/FLU/RSV plus assay is intended as an aid in the diagnosis of influenza from Nasopharyngeal swab specimens and should not be used as Elycia Woodside sole basis for treatment. Nasal washings and aspirates are unacceptable for Xpert Xpress SARS-CoV-2/FLU/RSV testing.  Fact Sheet for Patients: EntrepreneurPulse.com.au  Fact Sheet for Healthcare Providers: IncredibleEmployment.be  This test is not yet approved or cleared by the [place] FDA and has been authorized for detection and/or diagnosis of SARS-CoV-2 by FDA under an Emergency Use Authorization (EUA). This EUA will remain in effect (meaning this test can be used) for the duration of the COVID-19 declaration under Section 564(b)(1) of the Act, 21 U.S.C. section 360bbb-3(b)(1), unless the authorization is terminated or revoked.  Performed at [facility], [place] [address], [place], [place] [address]   Culture, blood (routine x 2)     Status: None   Collection Time: 01/30/21  9:45 PM   Specimen: BLOOD RIGHT HAND  Result Value Ref  Range Status   Specimen Description BLOOD RIGHT HAND  Final   Special Requests   Final    AEROBIC BOTTLE ONLY Blood Culture results may not be optimal due to an inadequate volume of blood received in culture bottles   Culture   Final  NO GROWTH 5 DAYS Performed at [facility], [place] [address] [place], [place] [address]    Report Status 02/05/2021 FINAL  Final  Culture, blood (routine x 2)     Status: Abnormal   Collection Time: 01/30/21  9:50 PM   Specimen: BLOOD RIGHT HAND  Result Value Ref Range Status   Specimen Description BLOOD RIGHT HAND  Final   Special Requests AEROBIC BOTTLE ONLY Blood Culture adequate volume  Final   Culture  Setup Time   Final    AEROBIC BOTTLE ONLY GRAM POSITIVE COCCI CRITICAL RESULT CALLED TO, READ BACK BY AND VERIFIED WITH: J LEDFORD PHARMD 02/01/21 0238 JDW    Culture (Larkyn Greenberger)  Final    STAPHYLOCOCCUS EPIDERMIDIS THE SIGNIFICANCE OF ISOLATING THIS ORGANISM FROM Asuncion Shibata SINGLE SET OF BLOOD CULTURES WHEN MULTIPLE SETS ARE DRAWN IS UNCERTAIN. PLEASE NOTIFY THE MICROBIOLOGY DEPARTMENT WITHIN ONE WEEK IF SPECIATION AND SENSITIVITIES ARE REQUIRED. Performed at [facility], [place] [address] [place], [place] [address]    Report Status 02/02/2021 FINAL  Final  Blood Culture ID Panel (Reflexed)     Status: Abnormal   Collection Time: 01/30/21  9:50 PM  Result Value Ref Range Status   Enterococcus faecalis NOT DETECTED NOT DETECTED Final   Enterococcus Faecium NOT DETECTED NOT DETECTED Final   Listeria monocytogenes NOT DETECTED NOT DETECTED Final   Staphylococcus species DETECTED (Eloise Mula) NOT DETECTED Final    Comment: CRITICAL RESULT CALLED TO, READ BACK BY AND VERIFIED WITH: J LEDFORD PHARMD 02/01/21 0238 JDW    Staphylococcus aureus (BCID) NOT DETECTED NOT DETECTED Final   Staphylococcus epidermidis DETECTED (Markiyah Gahm) NOT DETECTED Final    Comment: Methicillin (oxacillin) resistant coagulase negative staphylococcus. Possible blood culture contaminant (unless  isolated from more than one blood culture draw or clinical case suggests pathogenicity). No antibiotic treatment is indicated for blood  culture contaminants. CRITICAL RESULT CALLED TO, READ BACK BY AND VERIFIED WITH: J LEDFORD [facility] 02/01/21 0238 JDW    Staphylococcus lugdunensis NOT DETECTED NOT DETECTED Final   Streptococcus species NOT DETECTED NOT DETECTED Final   Streptococcus agalactiae NOT DETECTED NOT DETECTED Final   Streptococcus pneumoniae NOT DETECTED NOT DETECTED Final   Streptococcus pyogenes NOT DETECTED NOT DETECTED Final   Ruben Pyka.calcoaceticus-baumannii NOT DETECTED NOT DETECTED Final   Bacteroides fragilis NOT DETECTED NOT DETECTED Final   Enterobacterales NOT DETECTED NOT DETECTED Final   Enterobacter cloacae complex NOT DETECTED NOT DETECTED Final   Escherichia coli NOT DETECTED NOT DETECTED Final   Klebsiella aerogenes NOT DETECTED NOT DETECTED Final   Klebsiella oxytoca NOT DETECTED NOT DETECTED Final   Klebsiella pneumoniae NOT DETECTED NOT DETECTED Final   Proteus species NOT DETECTED NOT DETECTED Final   Salmonella species NOT DETECTED NOT DETECTED Final   Serratia marcescens NOT DETECTED NOT DETECTED Final   Haemophilus influenzae NOT DETECTED NOT DETECTED Final   Neisseria meningitidis NOT DETECTED NOT DETECTED Final   Pseudomonas aeruginosa NOT DETECTED NOT DETECTED Final   Stenotrophomonas maltophilia NOT DETECTED NOT DETECTED Final   Candida albicans NOT DETECTED NOT DETECTED Final   Candida auris NOT DETECTED NOT DETECTED Final   Candida glabrata NOT DETECTED NOT DETECTED Final   Candida krusei NOT DETECTED NOT DETECTED Final   Candida parapsilosis NOT DETECTED NOT DETECTED Final   Candida tropicalis NOT DETECTED NOT DETECTED Final   Cryptococcus neoformans/gattii NOT DETECTED NOT DETECTED Final   Methicillin resistance mecA/C DETECTED (Ashyla Luth) NOT DETECTED Final    Comment: CRITICAL RESULT CALLED TO,  READ BACK BY AND VERIFIED WITH: Karsten Ro [facility] 02/01/21  0238 JDW Performed at [place] [address] [place], [place] [address]   MRSA PCR Screening     Status: None   Collection Time: 01/31/21 12:36 AM   Specimen: Nasal Mucosa; Nasopharyngeal  Result Value Ref Range Status   MRSA by PCR NEGATIVE NEGATIVE Final    Comment:        The GeneXpert MRSA Assay (FDA approved for NASAL specimens only), is one component of Delmon Andrada comprehensive MRSA colonization surveillance program. It is not intended to diagnose MRSA infection nor to guide or monitor treatment for MRSA infections. Performed at [facility], [place] [address], [place], [place] [address]   Expectorated Sputum Assessment w Gram Stain, Rflx to Resp Cult     Status: None   Collection Time: 02/02/21 10:30 PM   Specimen: Expectorated Sputum  Result Value Ref Range Status   Specimen Description EXPECTORATED SPUTUM  Final   Special Requests NONE  Final   Sputum evaluation   Final    THIS SPECIMEN IS ACCEPTABLE FOR SPUTUM CULTURE Performed at [facility], [place] [address] [place], [place] [address]    Report Status 02/03/2021 FINAL  Final  Culture, Respiratory w Gram Stain     Status: None   Collection Time: 02/02/21 10:30 PM  Result Value Ref Range Status   Specimen Description EXPECTORATED SPUTUM  Final   Special Requests NONE Reflexed from [id number]  Final   Gram Stain   Final    RARE WBC PRESENT,BOTH PMN AND MONONUCLEAR ABUNDANT GRAM POSITIVE COCCI IN PAIRS FEW GRAM NEGATIVE RODS    Culture   Final    RARE Normal respiratory flora-no Staph aureus or Pseudomonas seen Performed at [facility], [place] [address] [place], [place] [address]    Report Status 02/05/2021 FINAL  Final     Labs: Basic Metabolic Panel: Recent Labs  Lab 01/31/21 0222 02/01/21 0415 02/02/21 0425 02/03/21 0255 02/04/21 0155 02/05/21 0700 02/05/21 1129 02/05/21 1134 02/06/21 0352  NA 134*   < > 140 140 140 141 141  140 140 142  K 5.4*   < > 3.5 3.6 3.8 4.0 4.3  4.3  4.2 4.2  CL 99   < > 101 99 99 103  --   --  106  CO2 27   < > 32 33* 34* 29  --   --  28  GLUCOSE 287*   < > 92 100* 108* 127*  --   --  121*  BUN 34*   < > 37* 38* 48* 49*  --   --  50*  CREATININE 1.60*   < > 1.64* 1.68* 1.79* 1.75*  --   --  1.90*  CALCIUM 8.4*   < > 8.6* 8.7* 8.6* 8.8*  --   --  8.7*  MG 2.2  --   --  2.0  --   --   --   --  2.0  PHOS 4.4  --   --  3.7  --   --   --   --  3.9   < > = values in this interval not displayed.   Liver Function Tests: Recent Labs  Lab 01/30/21 2010 02/03/21 0255 02/06/21 0352  AST '29 21 22  '$ ALT '20 15 19  '$ ALKPHOS 93 65 53  BILITOT 0.6 1.2 1.4*  PROT 7.7 5.9* 5.6*  ALBUMIN 3.6 2.8* 2.7*  No results for input(s): LIPASE, AMYLASE in the last 168 hours. No results for input(s): AMMONIA in the last 168 hours. CBC: Recent Labs  Lab 01/30/21 2010 01/30/21 2044 02/02/21 0425 02/03/21 0255 02/04/21 0155 02/05/21 0700 02/05/21 1129 02/05/21 1134 02/06/21 0352  WBC 7.5   < > 4.7 5.1 4.8 5.4  --   --  5.9  NEUTROABS 2.6  --   --  2.0  --   --   --   --   --   HGB 13.0   < > 9.7* 10.1* 9.8* 9.9* 9.9*  10.2* 9.2* 9.2*  HCT 41.5   < > 30.2* 31.6* 31.1* 31.3* 29.0*  30.0* 27.0* 28.9*  MCV 92.4   < > 89.3 88.8 90.4 90.5  --   --  90.3  PLT 365   < > 262 278 306 302  --   --  290   < > = values in this interval not displayed.   Cardiac Enzymes: No results for input(s): CKTOTAL, CKMB, CKMBINDEX, TROPONINI in the last 168 hours. BNP: BNP (last 3 results) Recent Labs    07/14/20 0212 11/10/20 0650 01/30/21 2010  BNP 397.2* 294.0* 587.4*    ProBNP (last 3 results) No results for input(s): PROBNP in the last 8760 hours.  CBG: Recent Labs  Lab 02/05/21 1207 02/05/21 1616 02/05/21 2100 02/06/21 0557 02/06/21 1136  GLUCAP 140* 171* 246* 118* 238*       Signed:  Fayrene Helper MD.  [facility] 02/06/2021, 1:52 PM

## 2021-02-06 NOTE — Progress Notes (Signed)
Occupational Therapy Treatment Patient Details Name: [name] MRN: [id number] DOB: 28-Aug-1941 Today's Date: 02/06/2021    History of present illness 80 yo admitted 4/19 with respiratory failure with pulmonary edema and heart failure, intubated 4/19-4/20. PMHx: Afib, CVA, HLD, HTN, CAD, HFpEF, DM   OT comments  Pt completed all OT goals this session. She demonstrated mod I for Dressing, toileting, grooming standing at the sink, and functional mobility with no physical assistance. Pt reviewed energy conservation techniques and fall prevention techniques for once she's home. Pt had all needs met and no further concerns. Acute OT to discharge.    Follow Up Recommendations  No OT follow up    Equipment Recommendations  Other (comment) (2W RW)    Recommendations for Other Services      Precautions / Restrictions Precautions Precautions: Fall Restrictions Weight Bearing Restrictions: No       Mobility Bed Mobility Overal bed mobility: Independent             General bed mobility comments: Pt able to complete bed mobility with HOB flat, using rails as needed.    Transfers Overall transfer level: Modified independent Equipment used: Rolling walker (2 wheeled)             General transfer comment: Mod I with RW for transfers on and off BSC, bed, and recliner.    Balance Overall balance assessment: Mild deficits observed, not formally tested Sitting-balance support: Feet supported Sitting balance-Leahy Scale: Good     Standing balance support: Bilateral upper extremity supported Standing balance-Leahy Scale: Fair Standing balance comment: able to stand for peri care and grooming task not holding onto RW                           ADL either performed or assessed with clinical judgement   ADL Overall ADL's : At baseline Eating/Feeding: Independent;Sitting   Grooming: Wash/dry hands;Oral care;Set up;Standing           Upper Body Dressing :  Set up;Sitting   Lower Body Dressing: Modified independent;Sit to/from stand;Sitting/lateral leans   Toilet Transfer: RW;Modified Dentist and Hygiene: Modified independent;Sit to/from stand       Functional mobility during ADLs: Modified independent;Rolling walker General ADL Comments: Pt is able to complete all ADL's back at her baseline level with intermittent supervision. Ambulating with mod I using RW.     Vision       Perception     Praxis      Cognition Arousal/Alertness: Awake/alert Behavior During Therapy: WFL for tasks assessed/performed Overall Cognitive Status: History of cognitive impairments - at baseline                                 General Comments: ST memory primarily affected.  Decreased safety at times.        Exercises     Shoulder Instructions       General Comments VSS on RA    Pertinent Vitals/ Pain       Pain Assessment: No/denies pain  Home Living                                          Prior Functioning/Environment  Frequency  Min 2X/week        Progress Toward Goals  OT Goals(current goals can now be found in the care plan section)  Progress towards OT goals: Goals met/education completed, patient discharged from OT  Acute Rehab OT Goals Patient Stated Goal: Return home OT Goal Formulation: With patient Time For Goal Achievement: 02/15/21 Potential to Achieve Goals: Good ADL Goals Pt Will Perform Grooming: with modified independence;standing Pt Will Perform Lower Body Bathing: with modified independence;sit to/from stand Pt Will Perform Lower Body Dressing: with modified independence;sit to/from stand;with adaptive equipment Pt Will Transfer to Toilet: with modified independence;ambulating;regular height toilet Pt Will Perform Toileting - Clothing Manipulation and hygiene: with modified independence;sit to/from stand  Plan All  goals met and education completed, patient discharged from OT services    Co-evaluation                 AM-PAC OT "6 Clicks" Daily Activity     Outcome Measure   Help from another person eating meals?: None Help from another person taking care of personal grooming?: None Help from another person toileting, which includes using toliet, bedpan, or urinal?: None Help from another person bathing (including washing, rinsing, drying)?: None Help from another person to put on and taking off regular upper body clothing?: None Help from another person to put on and taking off regular lower body clothing?: None 6 Click Score: 24    End of Session Equipment Utilized During Treatment: Rolling walker  OT Visit Diagnosis: Unsteadiness on feet (R26.81);Muscle weakness (generalized) (M62.81)   Activity Tolerance Patient tolerated treatment well   Patient Left in bed;with call bell/phone within reach   Nurse Communication Mobility status        Time: 1035-1101 OT Time Calculation (min): 26 min  Charges: OT General Charges $OT Visit: 1 Visit OT Treatments $Self Care/Home Management : 23-37 mins   H., OTR/L Acute Rehabilitation   [name]  02/06/2021, 12:45 PM    

## 2021-02-06 NOTE — Care Management Important Message (Signed)
Important Message  Patient Details  Name: CHRISTEEN LOVE MRN: [id number] Date of Birth: [birth year]   Medicare Important Message Given:  Yes     Shelda Altes 02/06/2021, 8:30 AM

## 2021-02-06 NOTE — Progress Notes (Signed)
ANTICOAGULATION CONSULT NOTE - Follow Up Consult  Pharmacy Consult for Heparin Indication: atrial flutter and hx PE 2009  No Known Allergies  Patient Measurements: Height: '5\' 6"'$  (167.6 cm) Weight: 84.4 kg (186 lb 1.6 oz) IBW/kg (Calculated) : 59.3  Vital Signs: Temp: 98.6 F (37 C) (04/26 0323) Temp Source: Oral (04/26 0323) BP: 126/55 (04/26 0323) Pulse Rate: 76 (04/26 0323)  Labs: Recent Labs    02/04/21 0155 02/05/21 0700 02/05/21 1129 02/05/21 1134 02/06/21 0352  HGB 9.8* 9.9* 9.9*  10.2* 9.2* 9.2*  HCT 31.1* 31.3* 29.0*  30.0* 27.0* 28.9*  PLT 306 302  --   --  290  LABPROT 21.9* 18.5*  --   --  16.5*  INR 1.9* 1.5*  --   --  1.3*  HEPARINUNFRC 0.56 0.69  --   --  0.27*  CREATININE 1.79* 1.75*  --   --  1.90*    Estimated Creatinine Clearance: 25.8 mL/min (A) (by C-G formula based on SCr of 1.9 mg/dL (H)).  Assessment: 53 YOF presented 01/30/21 with SOB, hx of PE and aflutter on warfarin PTA. INR subtherapeutic on admission at 1.7 and heparin drip begun while holding warfarin for procedures. Pharmacy consulted for heparin to start 6hr after sheath removal and warfarin dosing.   HL 0.27 slightly subtherapeutic after restarting at 1300 units/hr post-cath at 1800. Patient was almost supratherapeutic on 1350 units/hr before cath. H/H, plt stable. Ok to stop heparin today per Cardiology.  Of note, patient is now on aspirin and clopidogrel, prior to admission was not on aspirin. Per Cardiology, stop aspirin at discharge given triple therapy.  INR 1.3 today. Warfarin has been held since 01/31/21. Ok to restart 4/26 per Cardiology without bridging.   PTA warfarin regimen: 5 mg daily. Last outpatient dose 4/19.   Goal of Therapy:  Heparin level 0.3-0.7 units/ml Monitor platelets by anticoagulation protocol: Yes   Plan:  Stop heparin   Warfarin '5mg'$  x1 today For discharge, stop aspirin and resume home warfarin of '5mg'$  daily with INR check by 5/2   Monitor daily INR,  HL, CBC/plt Monitor for signs/symptoms of bleeding   Benetta Spar, PharmD, BCPS, BCCP Clinical Pharmacist  Please check AMION for all [place] phone numbers After 10:00 PM, call [place]

## 2021-02-06 NOTE — Consult Note (Signed)
   [facility] Inpatient Consult   02/06/2021  [name] 19-Nov-1940 [id number]   [name] [ACO] Patient: Medicare CMS DCE  Patient showing as high risk for unplanned readmission score.  Spoke with the patient regarding the benefits of [place] [facility] services.  Patient states she has go family support with transportation, food resources, and medications. Explained that [place] [facility] is a covered benefit of insurance with her primary care provider.  Primary Care Provider:    Celene Squibb, MD which is listed to provide the transition of care follow up calls and appointment.  Explained that [place] [facility] does not interfere with or replace any services arranged by the inpatient Transition of Care [TOC] team.  Patient deny an additional needs.  Plan:  Patient will have General EMMI Discharge and explained she may get a live nurse call.  Gave patient the [facility] 24 hour nurse advise line magnet as well.  Patient verbalized understanding.  For questions, please contact:  Caitlin Brood, RN BSN [facility] Liaison  [phone] business mobile phone Toll free office [phone]  Fax number: [phone] [name]

## 2021-02-06 NOTE — Telephone Encounter (Signed)
Pt's daughter notified and voiced how thankful she is.

## 2021-02-06 NOTE — Telephone Encounter (Signed)
I reviewed the hospital rounding note from today.  It does sound like cardiac catheterization was reassuring in terms of her coronaries and stents.  LVEF is reduced at 40 to 45% so keeping fluid status stabilized will be important in terms of her symptoms.  The degree of aortic stenosis looks to be only mild to moderate by echocardiography, and at this point she would not be considered for valve replacement.  If this were to progress however, she could be a potential candidate for TAVR.  I am not certain whether they had PT evaluate her in terms of any assistance at home, that would be an option prior to discharge.  We will keep the visit scheduled for May 3, although can certainly reschedule if they need to.

## 2021-02-06 NOTE — Telephone Encounter (Signed)
Patient is currently in [facility] an not sure she will be able to make to 5/3 appointment. She wants to know best guidance plan for her mother. Patient had stints placed in the right on Oct 2021 had a Heart Cath done yesterday by Dr.Berry and the stints appeared open. He is concerned it is her left side-related to her stenosis. She is wanting to know if Heart Valve replacement is an option with her age & condition. She would like to know what their options are but [facility] doctors have not gave her much details. She is wanting Dr. Myles Gip opinion. He has treated both the patient and her father Chriss Czar [place]/passed May 2021). Pam Danella Maiers (Daughter) is her Medical POA. She can be reach at [phone].

## 2021-02-06 NOTE — Plan of Care (Signed)
  Problem: Activity: Goal: Risk for activity intolerance will decrease Outcome: Progressing   Problem: Nutrition: Goal: Adequate nutrition will be maintained Outcome: Progressing   Problem: Coping: Goal: Level of anxiety will decrease Outcome: Progressing   Problem: Elimination: Goal: Will not experience complications related to bowel motility Outcome: Progressing Goal: Will not experience complications related to urinary retention Outcome: Progressing   

## 2021-02-07 DIAGNOSIS — I130 Hypertensive heart and chronic kidney disease with heart failure and stage 1 through stage 4 chronic kidney disease, or unspecified chronic kidney disease: Secondary | ICD-10-CM | POA: Diagnosis not present

## 2021-02-07 DIAGNOSIS — D508 Other iron deficiency anemias: Secondary | ICD-10-CM | POA: Diagnosis not present

## 2021-02-07 DIAGNOSIS — I350 Nonrheumatic aortic (valve) stenosis: Secondary | ICD-10-CM | POA: Diagnosis not present

## 2021-02-07 DIAGNOSIS — I10 Essential (primary) hypertension: Secondary | ICD-10-CM | POA: Diagnosis not present

## 2021-02-07 DIAGNOSIS — R051 Acute cough: Secondary | ICD-10-CM | POA: Diagnosis not present

## 2021-02-07 DIAGNOSIS — E1165 Type 2 diabetes mellitus with hyperglycemia: Secondary | ICD-10-CM | POA: Diagnosis not present

## 2021-02-07 DIAGNOSIS — I2510 Atherosclerotic heart disease of native coronary artery without angina pectoris: Secondary | ICD-10-CM | POA: Diagnosis not present

## 2021-02-07 DIAGNOSIS — J9601 Acute respiratory failure with hypoxia: Secondary | ICD-10-CM | POA: Diagnosis not present

## 2021-02-07 DIAGNOSIS — N1832 Chronic kidney disease, stage 3b: Secondary | ICD-10-CM | POA: Diagnosis not present

## 2021-02-07 DIAGNOSIS — I2111 ST elevation (STEMI) myocardial infarction involving right coronary artery: Secondary | ICD-10-CM | POA: Diagnosis not present

## 2021-02-07 DIAGNOSIS — E782 Mixed hyperlipidemia: Secondary | ICD-10-CM | POA: Diagnosis not present

## 2021-02-07 DIAGNOSIS — I482 Chronic atrial fibrillation, unspecified: Secondary | ICD-10-CM | POA: Diagnosis not present

## 2021-02-07 DIAGNOSIS — Z955 Presence of coronary angioplasty implant and graft: Secondary | ICD-10-CM | POA: Diagnosis not present

## 2021-02-07 DIAGNOSIS — I5031 Acute diastolic (congestive) heart failure: Secondary | ICD-10-CM | POA: Diagnosis not present

## 2021-02-07 DIAGNOSIS — D649 Anemia, unspecified: Secondary | ICD-10-CM | POA: Diagnosis not present

## 2021-02-07 DIAGNOSIS — Z8673 Personal history of transient ischemic attack (TIA), and cerebral infarction without residual deficits: Secondary | ICD-10-CM | POA: Diagnosis not present

## 2021-02-07 DIAGNOSIS — E785 Hyperlipidemia, unspecified: Secondary | ICD-10-CM | POA: Diagnosis not present

## 2021-02-07 DIAGNOSIS — R531 Weakness: Secondary | ICD-10-CM | POA: Diagnosis not present

## 2021-02-07 DIAGNOSIS — R5383 Other fatigue: Secondary | ICD-10-CM | POA: Diagnosis not present

## 2021-02-07 DIAGNOSIS — J069 Acute upper respiratory infection, unspecified: Secondary | ICD-10-CM | POA: Diagnosis not present

## 2021-02-07 DIAGNOSIS — F0150 Vascular dementia without behavioral disturbance: Secondary | ICD-10-CM | POA: Diagnosis not present

## 2021-02-09 DIAGNOSIS — J9601 Acute respiratory failure with hypoxia: Secondary | ICD-10-CM | POA: Diagnosis not present

## 2021-02-09 DIAGNOSIS — Z7902 Long term (current) use of antithrombotics/antiplatelets: Secondary | ICD-10-CM | POA: Diagnosis not present

## 2021-02-09 DIAGNOSIS — I350 Nonrheumatic aortic (valve) stenosis: Secondary | ICD-10-CM | POA: Diagnosis not present

## 2021-02-09 DIAGNOSIS — J189 Pneumonia, unspecified organism: Secondary | ICD-10-CM | POA: Diagnosis not present

## 2021-02-09 DIAGNOSIS — M1990 Unspecified osteoarthritis, unspecified site: Secondary | ICD-10-CM | POA: Diagnosis not present

## 2021-02-09 DIAGNOSIS — E785 Hyperlipidemia, unspecified: Secondary | ICD-10-CM | POA: Diagnosis not present

## 2021-02-09 DIAGNOSIS — J069 Acute upper respiratory infection, unspecified: Secondary | ICD-10-CM | POA: Diagnosis not present

## 2021-02-09 DIAGNOSIS — I2510 Atherosclerotic heart disease of native coronary artery without angina pectoris: Secondary | ICD-10-CM | POA: Diagnosis not present

## 2021-02-09 DIAGNOSIS — I110 Hypertensive heart disease with heart failure: Secondary | ICD-10-CM | POA: Diagnosis not present

## 2021-02-09 DIAGNOSIS — I2111 ST elevation (STEMI) myocardial infarction involving right coronary artery: Secondary | ICD-10-CM | POA: Diagnosis not present

## 2021-02-09 DIAGNOSIS — E119 Type 2 diabetes mellitus without complications: Secondary | ICD-10-CM | POA: Diagnosis not present

## 2021-02-09 DIAGNOSIS — L89151 Pressure ulcer of sacral region, stage 1: Secondary | ICD-10-CM | POA: Diagnosis not present

## 2021-02-09 DIAGNOSIS — I4891 Unspecified atrial fibrillation: Secondary | ICD-10-CM | POA: Diagnosis not present

## 2021-02-09 DIAGNOSIS — Z7901 Long term (current) use of anticoagulants: Secondary | ICD-10-CM | POA: Diagnosis not present

## 2021-02-09 DIAGNOSIS — Z955 Presence of coronary angioplasty implant and graft: Secondary | ICD-10-CM | POA: Diagnosis not present

## 2021-02-09 DIAGNOSIS — Z7984 Long term (current) use of oral hypoglycemic drugs: Secondary | ICD-10-CM | POA: Diagnosis not present

## 2021-02-09 DIAGNOSIS — I5031 Acute diastolic (congestive) heart failure: Secondary | ICD-10-CM | POA: Diagnosis not present

## 2021-02-12 DIAGNOSIS — J069 Acute upper respiratory infection, unspecified: Secondary | ICD-10-CM | POA: Diagnosis not present

## 2021-02-12 DIAGNOSIS — I5031 Acute diastolic (congestive) heart failure: Secondary | ICD-10-CM | POA: Diagnosis not present

## 2021-02-12 DIAGNOSIS — J9601 Acute respiratory failure with hypoxia: Secondary | ICD-10-CM | POA: Diagnosis not present

## 2021-02-12 DIAGNOSIS — J189 Pneumonia, unspecified organism: Secondary | ICD-10-CM | POA: Diagnosis not present

## 2021-02-12 DIAGNOSIS — I110 Hypertensive heart disease with heart failure: Secondary | ICD-10-CM | POA: Diagnosis not present

## 2021-02-12 DIAGNOSIS — I2111 ST elevation (STEMI) myocardial infarction involving right coronary artery: Secondary | ICD-10-CM | POA: Diagnosis not present

## 2021-02-12 NOTE — Progress Notes (Signed)
Cardiology Office Note  Date: 02/13/2021   ID: [name], DOB 04-19-1941, MRN [id number]  PCP:  Celene Squibb, MD  Cardiologist:  Rozann Lesches, MD Electrophysiologist:  None   Chief Complaint  Patient presents with  . Hospitalization Follow-up    History of Present Illness: [name] is an 80 y.o. female last assessed via telehealth encounter in January.  She presents now for a post hospital follow-up visit.  She presented with acute hypoxic respiratory failure in the setting of fluid overload, required ventilatory support initially with diuresis and further stabilization.  High-sensitivity troponin I level peaked at 416 consistent with demand ischemia.  She was followed by cardiology and underwent a cardiac catheterization demonstrating patent mid LAD and mid to distal RCA stent sites, otherwise branch vessel disease and plan for medical therapy.  She is here today with her daughter who had several questions about the hospital stay, also concerns about inadequate communication from some of the hospital providers.  We went over the results of hospital testing, discussed the implications and plan going forward.  [name] is back at home, using a walker.  She does have OT treatments at home.  Still fatigued and "washed out."  Weight has not climbed significantly since discharge, but is higher than documented back in January.  Not entirely clear that she is at her true baseline.  She had a visit with her PCP office following hospital discharge.  She was started on antibiotics for treatment of potential pneumonia.  No fevers or chills.  Chest x-ray from April 21 demonstrated cardiomegaly with vascular congestion, some interstitial edema but no infiltrates.  We are requesting interval lab work from PCP obtained recently.  Echocardiogram revealed LVEF 40 to 45% with severe LV dilatation and grade 2 diastolic dysfunction.  Aortic valve was significantly calcified with evidence  of mild to moderate aortic stenosis, mean gradient 11 mmHg.  We discussed these results today.  I reviewed her medications as noted below.  Plan to uptitrate Lasix gradually and reassess clinical response and renal function.  Past Medical History:  Diagnosis Date  . Arthritis   . Atrial flutter ([place])    a. 06/2013 s/p RFCA.  . Bilateral pulmonary embolism ([place]) 08/2008   a. Chronic coumadin.  . Carotid arterial disease ([place])    a. 03/2017 Carotid angio: RICA 0000000, LICA 99991111.  . CKD (chronic kidney disease), stage III ([place])   . Coronary artery disease    a. 05/2005 Inf STEMI/PCI: severe LAD/LCX/RCA dzs->CABG advised, pt preferred PCI-->Cypher DES to mRCA & mLAD; b. 02/2008 PCI: ISR of RCA->Cypher DES. LAD patent; c. 07/2020 Inf STEMI/PCI: LM 30d, LAD 25m D1 80(small), RI 75, LCX 80ost->distal (small), RCA 25p/m, 10262m (3.0x26 Resolute Onyx DES).  . Essential hypertension   . HFrEF (heart failure with reduced ejection fraction) ([place]   a. 07/2020 Echo: EF 45-50% in setting of inf STEMI; b. 01/2021 Echo: EF 40-45%, glob HK, sev basal-septal LVH w/ grII DD. Nl RV fxn, RVSP 37.62m76m, sev dil LA, mild MR, mod AS.  . HMarland Kitchenperlipidemia   . Ischemic cardiomyopathy    a. 07/2020 Echo: EF 45-50%; b. 01/2021 Echo: EF 40-45%.  . Memory disorder 07/08/2017  . Moderate aortic stenosis    a. 01/2021 Echo: Mild-mod AS w/ mean grad 162m33m AoV area 2.58m/48mmax). In setting of LV dysfxn, AS likely more moderate.  . Myocardial infarction (HCC) [name] Stroke (HCC) [name] Type 2 diabetes mellitus (HCC) [place]  Vascular dementia [name])     Past Surgical History:  Procedure Laterality Date  . ATRIAL FLUTTER ABLATION N/A 06/17/2013   Procedure: ATRIAL FLUTTER ABLATION;  Surgeon: Thompson Grayer, MD;  Location: [name] CATH LAB;  Service: Cardiovascular;  Laterality: N/A;  . CAROTID PTA/STENT INTERVENTION N/A 04/10/2017   Procedure: Carotid PTA/Stent Intervention;  Surgeon: Algernon Huxley, MD;  Location: [place] CV LAB;   Service: Cardiovascular;  Laterality: N/A;  . CHOLECYSTECTOMY    . CORONARY STENT INTERVENTION N/A 07/14/2020   Procedure: CORONARY STENT INTERVENTION;  Surgeon: Sherren Mocha, MD;  Location: [place] CV LAB;  Service: Cardiovascular;  Laterality: N/A;  . HIP SURGERY    . KNEE ARTHROSCOPY WITH MEDIAL MENISECTOMY Left 09/25/2017   Procedure: KNEE ARTHROSCOPY WITH MEDIAL AND LATERAL MENISECTOMY;  Surgeon: [name], MD;  Location: [facility] ORS;  Service: Orthopedics;  Laterality: Left;  . LEFT HEART CATH AND CORONARY ANGIOGRAPHY N/A 07/14/2020   Procedure: LEFT HEART CATH AND CORONARY ANGIOGRAPHY;  Surgeon: Sherren Mocha, MD;  Location: [facility] CV LAB;  Service: Cardiovascular;  Laterality: N/A;  . RIGHT/LEFT HEART CATH AND CORONARY ANGIOGRAPHY N/A 02/05/2021   Procedure: RIGHT/LEFT HEART CATH AND CORONARY ANGIOGRAPHY;  Surgeon: Lorretta Harp, MD;  Location: [place] [facility] CV LAB;  Service: Cardiovascular;  Laterality: N/A;    Current Outpatient Medications  Medication Sig Dispense Refill  . albuterol (VENTOLIN HFA) 108 (90 Base) MCG/ACT inhaler Inhale 2 puffs into the lungs every 6 (six) hours as needed for wheezing or shortness of breath. 8 g 2  . ALPRAZolam (XANAX) 0.25 MG tablet Take 0.125-0.25 mg by mouth See admin instructions. 0.125 in the morning. 0.25 mg at bedtime    . Ascorbic Acid (VITAMIN C GUMMIES PO) Take 2 each by mouth daily. 180 mg    . atorvastatin (LIPITOR) 40 MG tablet Take 1 tablet (40 mg total) by mouth daily. 30 tablet 1  . b complex vitamins capsule Take 1 capsule by mouth daily.    . carvedilol (COREG) 6.25 MG tablet Take 1 tablet (6.25 mg total) by mouth 2 (two) times daily. 180 tablet 3  . cholecalciferol (VITAMIN D3) 25 MCG (1000 UNIT) tablet Take 1,000 Units by mouth daily.    . clopidogrel (PLAVIX) 75 MG tablet Take 1 tablet by mouth once daily with breakfast 90 tablet 2  . Coenzyme Q10 (CO Q 10) 100 MG CAPS Take 100 mg by mouth daily.    Marland Kitchen glipiZIDE  (GLUCOTROL XL) 5 MG 24 hr tablet Take 5 mg by mouth every evening.    . hydrALAZINE (APRESOLINE) 10 MG tablet Take 1 tablet (10 mg total) by mouth every 8 (eight) hours. 90 tablet 0  . isosorbide mononitrate (IMDUR) 30 MG 24 hr tablet Take 0.5 tablets (15 mg total) by mouth daily. 15 tablet 0  . MEGARED OMEGA-3 KRILL OIL PO Take 1,000 mg by mouth daily. When she remembers    . nitroGLYCERIN (NITROSTAT) 0.4 MG SL tablet Place 1 tablet (0.4 mg total) under the tongue every 5 (five) minutes x 3 doses as needed for chest pain. 25 tablet 3  . Polyethyl Glycol-Propyl Glycol (SYSTANE OP) Apply 1 drop to eye 3 (three) times daily as needed (dry eyes).    . vitamin E 180 MG (400 UNITS) capsule Take 400 Units by mouth daily.    Marland Kitchen warfarin (COUMADIN) 5 MG tablet Take 5 mg by mouth every evening.    . furosemide (LASIX) 20 MG tablet Take 1-2 tablets (20-40 mg  total) by mouth every other day. 135 tablet 2   No current facility-administered medications for this visit.   Allergies:  Patient has no known allergies.   Social History: The patient  reports that she quit smoking about 42 years ago. Her smoking use included cigarettes. She quit after 10.00 years of use. She has never used smokeless tobacco. She reports that she does not drink alcohol and does not use drugs.   Family History: The patient's family history includes Arthritis in an other family member; Cancer in an other family member; Diabetes in an other family member; Heart disease in an other family member; Heart failure in her mother.   ROS: No palpitations or syncope.  No orthopnea or PND.  Physical Exam: VS:  BP (!) 114/50   Pulse 69   Ht '5\' 5"'$  (1.651 m)   Wt 189 lb (85.7 kg)   SpO2 96%   BMI 31.45 kg/m , BMI Body mass index is 31.45 kg/m.  Wt Readings from Last 3 Encounters:  02/13/21 189 lb (85.7 kg)  02/06/21 186 lb 1.6 oz (84.4 kg)  11/10/20 179 lb (81.2 kg)    General: Elderly woman, using a walker.  Appears comfortable at  rest. HEENT: Conjunctiva and lids normal, mask. Neck: Supple, no elevated JVP or carotid bruits, no thyromegaly. Lungs: Few crackles at the very base, left greater than right, no wheezing, nonlabored breathing at rest. Cardiac: Regular rate and rhythm, no S3, 99991111 systolic murmur, no pericardial rub. Abdomen: Soft, nontender, bowel sounds present. Extremities: No pitting edema, distal pulses 2+. Skin: Warm and dry. Musculoskeletal: No kyphosis. Neuropsychiatric: Alert and oriented x3, affect grossly appropriate.  ECG:  An ECG dated 02/02/2021 was personally reviewed today and demonstrated:  Sinus rhythm with IVCD/LVH, diffuse repolarization abnormalities, leftward axis.  Recent Labwork: 07/14/2020: TSH 2.309 01/30/2021: B Natriuretic Peptide 587.4 02/06/2021: ALT 19; AST 22; BUN 50; Creatinine, Ser 1.90; Hemoglobin 9.2; Magnesium 2.0; Platelets 290; Potassium 4.2; Sodium 142     Component Value Date/Time   CHOL 177 07/14/2020 0038   TRIG 78 07/14/2020 0212   HDL 62 07/14/2020 0038   CHOLHDL 2.9 07/14/2020 0038   VLDL 24 07/14/2020 0038   [place] 91 07/14/2020 0038    Other Studies Reviewed Today:  Echocardiogram 01/31/2021: 1. Left ventricular ejection fraction, by estimation, is 40 to 45%. Left  ventricular ejection fraction by 3D volume is 42 %. The left ventricle has  mildly decreased function. The left ventricle demonstrates global  hypokinesis. The left ventricular  internal cavity size was severely dilated. There is severe left  ventricular hypertrophy of the basal-septal segment. Left ventricular  diastolic parameters are consistent with Grade II diastolic dysfunction  (pseudonormalization). Elevated left ventricular  end-diastolic pressure. The average left ventricular global longitudinal  strain is -9.1 %. The global longitudinal strain is abnormal.  2. Right ventricular systolic function is normal. The right ventricular  size is normal. There is mildly elevated  pulmonary artery systolic  pressure. The estimated right ventricular systolic pressure is [id number] mmHg.  3. Left atrial size was severely dilated.  4. The mitral valve is degenerative. Mild mitral valve regurgitation.  Severe mitral annular calcification.The posterior MV leaflet appears fixed  and immobile.  5. The aortic valve is calcified. There is severe calcifcation of the  aortic valve. There is severe thickening of the aortic valve. Aortic valve  regurgitation is not visualized. Mild to moderate aortic valve stenosis.  Aortic valve mean gradient measures  11.0 mmHg. Aortic valve  Vmax measures 2.16 m/s. In the setting of LV  dysfunction the AV gradient may be underestimated and likely is more on  the moderate AS side.  6. The inferior vena cava is normal in size with greater than 50%  respiratory variability, suggesting right atrial pressure of 3  mmHg.Compared to prior echo 2021, LVF appears similar, AV mean gradient  with no signfiicant change.   Cardiac catheterization 02/05/2021:  Previously placed Prox RCA to Dist RCA stent (unknown type) is widely patent.  Previously placed Mid LAD stent (unknown type) is widely patent.  1st Diag lesion is 75% stenosed.  Lat Ramus lesion is 99% stenosed.  Ramus lesion is 50% stenosed.  Ost Cx to Prox Cx lesion is 50% stenosed.  Mid LM to Dist LM lesion is 30% stenosed.  Hemodynamic findings consistent with aortic valve stenosis.  Assessment and Plan:  1.  Chronic combined heart failure with LVEF 40 to 45% and grade 2 diastolic dysfunction by recent assessment.  RV contraction normal.  She is currently on Coreg, hydralazine, Imdur, and Lasix which will be uptitrated to 20 mg alternating with 40 mg every other day.  Not on ARB/ANRI/Aldactone with CKD stage IIIb and fluctuating renal function. Plan to recheck a BMET prior to next office visit.  2.  Aortic stenosis, overall moderate range based on echocardiography with relatively low  mean gradient of 11 mmHg in the setting of LV dysfunction dimensionless index 0.32.  Not clearly in range to consider TAVR, although this will be reconsidered with further follow-up.  Would repeat echocardiogram no longer than 6 months from now.  3.  CAD status post DES to the RCA and LAD as discussed above, patent that recent angiography.  High-sensitivity troponin I levels during recent hospital stay were more in range with demand ischemia and heart failure.  Continue Lipitor, Plavix, Imdur, and as needed nitroglycerin.  4.  CKD stage IIIb.  Recent creatinine 1.9 with normal potassium.  Recheck BMET after Lasix adjustments.  Medication Adjustments/Labs and Tests Ordered: Current medicines are reviewed at length with the patient today.  Concerns regarding medicines are outlined above.   Tests Ordered: Orders Placed This Encounter  Procedures  . Basic metabolic panel    Medication Changes: Meds ordered this encounter  Medications  . furosemide (LASIX) 20 MG tablet    Sig: Take 1-2 tablets (20-40 mg total) by mouth every other day.    Dispense:  135 tablet    Refill:  2    02/13/2021 dose increase    Disposition:  Follow up 2 weeks in the [place] office.  Signed, Satira Sark, MD, [facility] 02/13/2021 11:58 AM    [facility] [place], [place], [place] [address] Phone: [phone]; Fax: [phone]

## 2021-02-13 ENCOUNTER — Other Ambulatory Visit: Payer: Self-pay | Admitting: *Deleted

## 2021-02-13 ENCOUNTER — Encounter: Payer: Self-pay | Admitting: Cardiology

## 2021-02-13 ENCOUNTER — Ambulatory Visit (INDEPENDENT_AMBULATORY_CARE_PROVIDER_SITE_OTHER): Payer: Medicare Other | Admitting: Cardiology

## 2021-02-13 ENCOUNTER — Ambulatory Visit (INDEPENDENT_AMBULATORY_CARE_PROVIDER_SITE_OTHER): Payer: Medicare Other | Admitting: *Deleted

## 2021-02-13 ENCOUNTER — Encounter: Payer: Self-pay | Admitting: *Deleted

## 2021-02-13 VITALS — BP 114/50 | HR 69 | Ht 65.0 in | Wt 189.0 lb

## 2021-02-13 DIAGNOSIS — I5042 Chronic combined systolic (congestive) and diastolic (congestive) heart failure: Secondary | ICD-10-CM | POA: Diagnosis not present

## 2021-02-13 DIAGNOSIS — Z5181 Encounter for therapeutic drug level monitoring: Secondary | ICD-10-CM

## 2021-02-13 DIAGNOSIS — N1832 Chronic kidney disease, stage 3b: Secondary | ICD-10-CM | POA: Diagnosis not present

## 2021-02-13 DIAGNOSIS — I25119 Atherosclerotic heart disease of native coronary artery with unspecified angina pectoris: Secondary | ICD-10-CM

## 2021-02-13 DIAGNOSIS — I4892 Unspecified atrial flutter: Secondary | ICD-10-CM | POA: Diagnosis not present

## 2021-02-13 DIAGNOSIS — Z79899 Other long term (current) drug therapy: Secondary | ICD-10-CM | POA: Diagnosis not present

## 2021-02-13 DIAGNOSIS — I350 Nonrheumatic aortic (valve) stenosis: Secondary | ICD-10-CM

## 2021-02-13 LAB — POCT INR: INR: 2.2 (ref 2.0–3.0)

## 2021-02-13 MED ORDER — CARVEDILOL 6.25 MG PO TABS: 6.2500 mg | ORAL_TABLET | Freq: Two times a day (BID) | ORAL | 3 refills | Status: DC

## 2021-02-13 MED ORDER — FUROSEMIDE 20 MG PO TABS: 20.0000 mg | ORAL_TABLET | ORAL | 2 refills | Status: DC

## 2021-02-13 NOTE — Patient Instructions (Addendum)
Medication Instructions:   Your physician has recommended you make the following change in your medication:   Increase furosemide to 20 mg alternating with 40 mg every other day  Continue other medications the same  Labwork:  Your physician recommends that you return for non-fasting lab work in: 10 days to check your BMET. This may be done at [employer] [facility] or [facility].   Testing/Procedures:  none  Follow-Up:  Your physician recommends that you schedule a follow-up appointment in: 2-3 weeks at the [place] office.   Any Other Special Instructions Will Be Listed Below (If Applicable).  If you need a refill on your cardiac medications before your next appointment, please call your pharmacy.

## 2021-02-13 NOTE — Patient Instructions (Signed)
Continue warfarin '5mg'$  daily  Recheck in 2 wk in office

## 2021-02-14 DIAGNOSIS — J9601 Acute respiratory failure with hypoxia: Secondary | ICD-10-CM | POA: Diagnosis not present

## 2021-02-14 DIAGNOSIS — I2111 ST elevation (STEMI) myocardial infarction involving right coronary artery: Secondary | ICD-10-CM | POA: Diagnosis not present

## 2021-02-14 DIAGNOSIS — I5031 Acute diastolic (congestive) heart failure: Secondary | ICD-10-CM | POA: Diagnosis not present

## 2021-02-14 DIAGNOSIS — J069 Acute upper respiratory infection, unspecified: Secondary | ICD-10-CM | POA: Diagnosis not present

## 2021-02-14 DIAGNOSIS — J189 Pneumonia, unspecified organism: Secondary | ICD-10-CM | POA: Diagnosis not present

## 2021-02-14 DIAGNOSIS — I110 Hypertensive heart disease with heart failure: Secondary | ICD-10-CM | POA: Diagnosis not present

## 2021-02-16 DIAGNOSIS — J189 Pneumonia, unspecified organism: Secondary | ICD-10-CM | POA: Diagnosis not present

## 2021-02-16 DIAGNOSIS — J9601 Acute respiratory failure with hypoxia: Secondary | ICD-10-CM | POA: Diagnosis not present

## 2021-02-16 DIAGNOSIS — I5031 Acute diastolic (congestive) heart failure: Secondary | ICD-10-CM | POA: Diagnosis not present

## 2021-02-16 DIAGNOSIS — I110 Hypertensive heart disease with heart failure: Secondary | ICD-10-CM | POA: Diagnosis not present

## 2021-02-16 DIAGNOSIS — I2111 ST elevation (STEMI) myocardial infarction involving right coronary artery: Secondary | ICD-10-CM | POA: Diagnosis not present

## 2021-02-16 DIAGNOSIS — J069 Acute upper respiratory infection, unspecified: Secondary | ICD-10-CM | POA: Diagnosis not present

## 2021-02-19 DIAGNOSIS — I2111 ST elevation (STEMI) myocardial infarction involving right coronary artery: Secondary | ICD-10-CM | POA: Diagnosis not present

## 2021-02-19 DIAGNOSIS — I5031 Acute diastolic (congestive) heart failure: Secondary | ICD-10-CM | POA: Diagnosis not present

## 2021-02-19 DIAGNOSIS — I110 Hypertensive heart disease with heart failure: Secondary | ICD-10-CM | POA: Diagnosis not present

## 2021-02-19 DIAGNOSIS — J069 Acute upper respiratory infection, unspecified: Secondary | ICD-10-CM | POA: Diagnosis not present

## 2021-02-19 DIAGNOSIS — J189 Pneumonia, unspecified organism: Secondary | ICD-10-CM | POA: Diagnosis not present

## 2021-02-19 DIAGNOSIS — J9601 Acute respiratory failure with hypoxia: Secondary | ICD-10-CM | POA: Diagnosis not present

## 2021-02-20 DIAGNOSIS — I110 Hypertensive heart disease with heart failure: Secondary | ICD-10-CM | POA: Diagnosis not present

## 2021-02-20 DIAGNOSIS — I2111 ST elevation (STEMI) myocardial infarction involving right coronary artery: Secondary | ICD-10-CM | POA: Diagnosis not present

## 2021-02-20 DIAGNOSIS — I5031 Acute diastolic (congestive) heart failure: Secondary | ICD-10-CM | POA: Diagnosis not present

## 2021-02-20 DIAGNOSIS — J189 Pneumonia, unspecified organism: Secondary | ICD-10-CM | POA: Diagnosis not present

## 2021-02-20 DIAGNOSIS — J069 Acute upper respiratory infection, unspecified: Secondary | ICD-10-CM | POA: Diagnosis not present

## 2021-02-20 DIAGNOSIS — J9601 Acute respiratory failure with hypoxia: Secondary | ICD-10-CM | POA: Diagnosis not present

## 2021-02-22 DIAGNOSIS — J9601 Acute respiratory failure with hypoxia: Secondary | ICD-10-CM | POA: Diagnosis not present

## 2021-02-22 DIAGNOSIS — I2111 ST elevation (STEMI) myocardial infarction involving right coronary artery: Secondary | ICD-10-CM | POA: Diagnosis not present

## 2021-02-22 DIAGNOSIS — I110 Hypertensive heart disease with heart failure: Secondary | ICD-10-CM | POA: Diagnosis not present

## 2021-02-22 DIAGNOSIS — J189 Pneumonia, unspecified organism: Secondary | ICD-10-CM | POA: Diagnosis not present

## 2021-02-22 DIAGNOSIS — J069 Acute upper respiratory infection, unspecified: Secondary | ICD-10-CM | POA: Diagnosis not present

## 2021-02-22 DIAGNOSIS — I5031 Acute diastolic (congestive) heart failure: Secondary | ICD-10-CM | POA: Diagnosis not present

## 2021-02-23 DIAGNOSIS — F0150 Vascular dementia without behavioral disturbance: Secondary | ICD-10-CM | POA: Diagnosis not present

## 2021-02-23 DIAGNOSIS — I482 Chronic atrial fibrillation, unspecified: Secondary | ICD-10-CM | POA: Diagnosis not present

## 2021-02-23 DIAGNOSIS — E785 Hyperlipidemia, unspecified: Secondary | ICD-10-CM | POA: Diagnosis not present

## 2021-02-23 DIAGNOSIS — I350 Nonrheumatic aortic (valve) stenosis: Secondary | ICD-10-CM | POA: Diagnosis not present

## 2021-02-23 DIAGNOSIS — R5383 Other fatigue: Secondary | ICD-10-CM | POA: Diagnosis not present

## 2021-02-23 DIAGNOSIS — D508 Other iron deficiency anemias: Secondary | ICD-10-CM | POA: Diagnosis not present

## 2021-02-23 DIAGNOSIS — N1832 Chronic kidney disease, stage 3b: Secondary | ICD-10-CM | POA: Diagnosis not present

## 2021-02-23 DIAGNOSIS — Z8673 Personal history of transient ischemic attack (TIA), and cerebral infarction without residual deficits: Secondary | ICD-10-CM | POA: Diagnosis not present

## 2021-02-23 DIAGNOSIS — I10 Essential (primary) hypertension: Secondary | ICD-10-CM | POA: Diagnosis not present

## 2021-02-23 DIAGNOSIS — E1165 Type 2 diabetes mellitus with hyperglycemia: Secondary | ICD-10-CM | POA: Diagnosis not present

## 2021-02-23 DIAGNOSIS — R051 Acute cough: Secondary | ICD-10-CM | POA: Diagnosis not present

## 2021-02-23 DIAGNOSIS — D649 Anemia, unspecified: Secondary | ICD-10-CM | POA: Diagnosis not present

## 2021-02-26 DIAGNOSIS — I5031 Acute diastolic (congestive) heart failure: Secondary | ICD-10-CM | POA: Diagnosis not present

## 2021-02-26 DIAGNOSIS — J9601 Acute respiratory failure with hypoxia: Secondary | ICD-10-CM | POA: Diagnosis not present

## 2021-02-26 DIAGNOSIS — J189 Pneumonia, unspecified organism: Secondary | ICD-10-CM | POA: Diagnosis not present

## 2021-02-26 DIAGNOSIS — I2111 ST elevation (STEMI) myocardial infarction involving right coronary artery: Secondary | ICD-10-CM | POA: Diagnosis not present

## 2021-02-26 DIAGNOSIS — J069 Acute upper respiratory infection, unspecified: Secondary | ICD-10-CM | POA: Diagnosis not present

## 2021-02-26 DIAGNOSIS — I110 Hypertensive heart disease with heart failure: Secondary | ICD-10-CM | POA: Diagnosis not present

## 2021-02-27 DIAGNOSIS — I110 Hypertensive heart disease with heart failure: Secondary | ICD-10-CM | POA: Diagnosis not present

## 2021-02-27 DIAGNOSIS — J069 Acute upper respiratory infection, unspecified: Secondary | ICD-10-CM | POA: Diagnosis not present

## 2021-02-27 DIAGNOSIS — J189 Pneumonia, unspecified organism: Secondary | ICD-10-CM | POA: Diagnosis not present

## 2021-02-27 DIAGNOSIS — J9601 Acute respiratory failure with hypoxia: Secondary | ICD-10-CM | POA: Diagnosis not present

## 2021-02-27 DIAGNOSIS — I2111 ST elevation (STEMI) myocardial infarction involving right coronary artery: Secondary | ICD-10-CM | POA: Diagnosis not present

## 2021-02-27 DIAGNOSIS — I5031 Acute diastolic (congestive) heart failure: Secondary | ICD-10-CM | POA: Diagnosis not present

## 2021-02-28 ENCOUNTER — Institutional Professional Consult (permissible substitution): Payer: Medicare Other | Admitting: Pulmonary Disease

## 2021-02-28 DIAGNOSIS — J069 Acute upper respiratory infection, unspecified: Secondary | ICD-10-CM | POA: Diagnosis not present

## 2021-02-28 DIAGNOSIS — I509 Heart failure, unspecified: Secondary | ICD-10-CM | POA: Diagnosis not present

## 2021-02-28 DIAGNOSIS — M6281 Muscle weakness (generalized): Secondary | ICD-10-CM | POA: Diagnosis not present

## 2021-03-01 ENCOUNTER — Ambulatory Visit (INDEPENDENT_AMBULATORY_CARE_PROVIDER_SITE_OTHER): Payer: Medicare Other | Admitting: *Deleted

## 2021-03-01 ENCOUNTER — Other Ambulatory Visit: Payer: Self-pay

## 2021-03-01 DIAGNOSIS — J9601 Acute respiratory failure with hypoxia: Secondary | ICD-10-CM | POA: Diagnosis not present

## 2021-03-01 DIAGNOSIS — J189 Pneumonia, unspecified organism: Secondary | ICD-10-CM | POA: Diagnosis not present

## 2021-03-01 DIAGNOSIS — J069 Acute upper respiratory infection, unspecified: Secondary | ICD-10-CM | POA: Diagnosis not present

## 2021-03-01 DIAGNOSIS — Z5181 Encounter for therapeutic drug level monitoring: Secondary | ICD-10-CM

## 2021-03-01 DIAGNOSIS — I2111 ST elevation (STEMI) myocardial infarction involving right coronary artery: Secondary | ICD-10-CM | POA: Diagnosis not present

## 2021-03-01 DIAGNOSIS — I4892 Unspecified atrial flutter: Secondary | ICD-10-CM

## 2021-03-01 DIAGNOSIS — I5031 Acute diastolic (congestive) heart failure: Secondary | ICD-10-CM | POA: Diagnosis not present

## 2021-03-01 DIAGNOSIS — I110 Hypertensive heart disease with heart failure: Secondary | ICD-10-CM | POA: Diagnosis not present

## 2021-03-01 LAB — POCT INR: INR: 2.7 (ref 2.0–3.0)

## 2021-03-01 NOTE — Patient Instructions (Signed)
Continue warfarin '5mg'$  daily  Recheck in 3 wk in office

## 2021-03-02 DIAGNOSIS — I2111 ST elevation (STEMI) myocardial infarction involving right coronary artery: Secondary | ICD-10-CM | POA: Diagnosis not present

## 2021-03-02 DIAGNOSIS — J9601 Acute respiratory failure with hypoxia: Secondary | ICD-10-CM | POA: Diagnosis not present

## 2021-03-02 DIAGNOSIS — I110 Hypertensive heart disease with heart failure: Secondary | ICD-10-CM | POA: Diagnosis not present

## 2021-03-02 DIAGNOSIS — J189 Pneumonia, unspecified organism: Secondary | ICD-10-CM | POA: Diagnosis not present

## 2021-03-02 DIAGNOSIS — I5031 Acute diastolic (congestive) heart failure: Secondary | ICD-10-CM | POA: Diagnosis not present

## 2021-03-02 DIAGNOSIS — J069 Acute upper respiratory infection, unspecified: Secondary | ICD-10-CM | POA: Diagnosis not present

## 2021-03-07 DIAGNOSIS — J069 Acute upper respiratory infection, unspecified: Secondary | ICD-10-CM | POA: Diagnosis not present

## 2021-03-07 DIAGNOSIS — I110 Hypertensive heart disease with heart failure: Secondary | ICD-10-CM | POA: Diagnosis not present

## 2021-03-07 DIAGNOSIS — J9601 Acute respiratory failure with hypoxia: Secondary | ICD-10-CM | POA: Diagnosis not present

## 2021-03-07 DIAGNOSIS — I5031 Acute diastolic (congestive) heart failure: Secondary | ICD-10-CM | POA: Diagnosis not present

## 2021-03-07 DIAGNOSIS — J189 Pneumonia, unspecified organism: Secondary | ICD-10-CM | POA: Diagnosis not present

## 2021-03-07 DIAGNOSIS — I2111 ST elevation (STEMI) myocardial infarction involving right coronary artery: Secondary | ICD-10-CM | POA: Diagnosis not present

## 2021-03-08 ENCOUNTER — Other Ambulatory Visit: Payer: Self-pay

## 2021-03-08 ENCOUNTER — Encounter: Payer: Self-pay | Admitting: Student

## 2021-03-08 ENCOUNTER — Ambulatory Visit (INDEPENDENT_AMBULATORY_CARE_PROVIDER_SITE_OTHER): Payer: Medicare Other | Admitting: Student

## 2021-03-08 VITALS — BP 146/84 | HR 76 | Ht 65.5 in | Wt 189.0 lb

## 2021-03-08 DIAGNOSIS — N1832 Chronic kidney disease, stage 3b: Secondary | ICD-10-CM

## 2021-03-08 DIAGNOSIS — I5042 Chronic combined systolic (congestive) and diastolic (congestive) heart failure: Secondary | ICD-10-CM

## 2021-03-08 DIAGNOSIS — I2510 Atherosclerotic heart disease of native coronary artery without angina pectoris: Secondary | ICD-10-CM | POA: Diagnosis not present

## 2021-03-08 DIAGNOSIS — I25119 Atherosclerotic heart disease of native coronary artery with unspecified angina pectoris: Secondary | ICD-10-CM | POA: Diagnosis not present

## 2021-03-08 DIAGNOSIS — I4892 Unspecified atrial flutter: Secondary | ICD-10-CM | POA: Diagnosis not present

## 2021-03-08 DIAGNOSIS — I350 Nonrheumatic aortic (valve) stenosis: Secondary | ICD-10-CM | POA: Diagnosis not present

## 2021-03-08 MED ORDER — ISOSORBIDE MONONITRATE ER 30 MG PO TB24: 30.0000 mg | ORAL_TABLET | Freq: Every day | ORAL | 3 refills | Status: DC

## 2021-03-08 NOTE — Patient Instructions (Signed)
Medication Instructions:  Your physician has recommended you make the following change in your medication:  INCREASE Imdur (Isorsobide Mononitrate) to 30 mg daily.  *If you need a refill on your cardiac medications before your next appointment, please call your pharmacy*   Lab Work: We have requested lab work from your primary care provider If you have labs (blood work) drawn today and your tests are completely normal, you will receive your results only by: Marland Kitchen MyChart Message (if you have MyChart) OR . A paper copy in the mail If you have any lab test that is abnormal or we need to change your treatment, we will call you to review the results.   Testing/Procedures: None   Follow-Up: At [facility], you and your health needs are our priority.  As part of our continuing mission to provide you with exceptional heart care, we have created designated Provider Care Teams.  These Care Teams include your primary Cardiologist (physician) and Advanced Practice Providers (APPs -  Physician Assistants and Nurse Practitioners) who all work together to provide you with the care you need, when you need it.  We recommend signing up for the patient portal called "MyChart".  Sign up information is provided on this After Visit Summary.  MyChart is used to connect with patients for Virtual Visits (Telemedicine).  Patients are able to view lab/test results, encounter notes, upcoming appointments, etc.  Non-urgent messages can be sent to your provider as well.   To learn more about what you can do with MyChart, go to [email or web address].    Your next appointment:   2 month(s)  The format for your next appointment:   In Person  Provider:   Bernerd Pho, PA-C   Other Instructions

## 2021-03-08 NOTE — Progress Notes (Signed)
Cardiology Office Note    Date:  03/08/2021   ID:  [name], DOB [birth year], MRN [id number]  PCP:  Celene Squibb, MD  Cardiologist: Rozann Lesches, MD    Chief Complaint  Patient presents with  . Follow-up    3 week visit    History of Present Illness:    [name] is a 80 y.o. female with past medical history of CAD (s/p DES to mid-RCA and DES to mid-LAD in 2006, DES to ISR of RCA in 2009, inferior STEMI in 07/2020 with ISR of RCA and treated with DES), HFrEF/ICM (EF 40-45% by echo in 01/2021), atrial flutter (s/p ablation in 2014), history of PE, HTN, HLD, Type 2 DM, Stage 3 CKD, prior CVA and aortic stenosis who presents to the office today for 3-week follow-up.   She was recently admitted to [facility] in 01/2021 for an NSTEMI and underwent repeat cardiac catheterization which showed no culprit lesion with patent stents along the RCA and LAD with medical management recommended. There was concern that she may have low output AS by her cardiac catheterization but was felt to be mild to moderate by her echocardiogram and repeat echocardiogram was recommended in 6 months.  She did follow-up with Dr. Domenic Polite on 02/13/2021 and reported still having fatigue and feeling "washed out".  Was being treated for PNA by her PCP.  She was volume overloaded by examination and Lasix was titrated to 20 mg daily alternating with 40 mg daily. She was not on an ACE-I/ARB/ARNI due to variable renal function.   In talking with the patient and her daughter today, she reports still feeling fatigued but feels like her dyspnea has improved since her last visit.  She denies any specific orthopnea, PND or pitting edema. Her weight has been stable on her home scales. She denies any chest pain or palpitations.   Past Medical History:  Diagnosis Date  . Arthritis   . Atrial flutter ([place])    a. 06/2013 s/p RFCA.  . Bilateral pulmonary embolism ([place]) 08/2008   a. Chronic coumadin.  . Carotid  arterial disease ([place])    a. 03/2017 Carotid angio: RICA 0000000, LICA 99991111.  . CKD (chronic kidney disease), stage III ([place])   . Coronary artery disease    a. 05/2005 Inf STEMI/PCI: severe LAD/LCX/RCA dzs->CABG advised, pt preferred PCI-->Cypher DES to mRCA & mLAD; b. 02/2008 PCI: ISR of RCA->Cypher DES. LAD patent; c. 07/2020 Inf STEMI/PCI: LM 30d, LAD 33m D1 80(small), RI 75, LCX 80ost->distal (small), RCA 25p/m, 10346m (3.0x26 Resolute Onyx DES).  . Essential hypertension   . HFrEF (heart failure with reduced ejection fraction) ([place]   a. 07/2020 Echo: EF 45-50% in setting of inf STEMI; b. 01/2021 Echo: EF 40-45%, glob HK, sev basal-septal LVH w/ grII DD. Nl RV fxn, RVSP 37.46m54m, sev dil LA, mild MR, mod AS.  . HMarland Kitchenperlipidemia   . Ischemic cardiomyopathy    a. 07/2020 Echo: EF 45-50%; b. 01/2021 Echo: EF 40-45%.  . Memory disorder 07/08/2017  . Moderate aortic stenosis    a. 01/2021 Echo: Mild-mod AS w/ mean grad 146m34m AoV area 2.64m/80mmax). In setting of LV dysfxn, AS likely more moderate.  . Myocardial infarction (HCC) [place] Stroke (HCC) [place] Type 2 diabetes mellitus (HCC) [place] Vascular dementia (HCC)[facility] Past Surgical History:  Procedure Laterality Date  . ATRIAL FLUTTER ABLATION N/A 06/17/2013   Procedure: ATRIAL FLUTTER ABLATION;  Surgeon:  Thompson Grayer, MD;  Location: [facility] CATH LAB;  Service: Cardiovascular;  Laterality: N/A;  . CAROTID PTA/STENT INTERVENTION N/A 04/10/2017   Procedure: Carotid PTA/Stent Intervention;  Surgeon: Algernon Huxley, MD;  Location: [place] CV LAB;  Service: Cardiovascular;  Laterality: N/A;  . CHOLECYSTECTOMY    . CORONARY STENT INTERVENTION N/A 07/14/2020   Procedure: CORONARY STENT INTERVENTION;  Surgeon: Sherren Mocha, MD;  Location: [place] CV LAB;  Service: Cardiovascular;  Laterality: N/A;  . HIP SURGERY    . KNEE ARTHROSCOPY WITH MEDIAL MENISECTOMY Left 09/25/2017   Procedure: KNEE ARTHROSCOPY WITH MEDIAL AND LATERAL MENISECTOMY;  Surgeon:  [name], MD;  Location: [facility];  Service: Orthopedics;  Laterality: Left;  . LEFT HEART CATH AND CORONARY ANGIOGRAPHY N/A 07/14/2020   Procedure: LEFT HEART CATH AND CORONARY ANGIOGRAPHY;  Surgeon: Sherren Mocha, MD;  Location: [place] CV LAB;  Service: Cardiovascular;  Laterality: N/A;  . RIGHT/LEFT HEART CATH AND CORONARY ANGIOGRAPHY N/A 02/05/2021   Procedure: RIGHT/LEFT HEART CATH AND CORONARY ANGIOGRAPHY;  Surgeon: Lorretta Harp, MD;  Location: [place] CV LAB;  Service: Cardiovascular;  Laterality: N/A;    Current Medications: Outpatient Medications Prior to Visit  Medication Sig Dispense Refill  . albuterol (VENTOLIN HFA) 108 (90 Base) MCG/ACT inhaler Inhale 2 puffs into the lungs every 6 (six) hours as needed for wheezing or shortness of breath. 8 g 2  . ALPRAZolam (XANAX) 0.25 MG tablet Take 0.125-0.25 mg by mouth See admin instructions. 0.125 in the morning. 0.25 mg at bedtime    . Ascorbic Acid (VITAMIN C GUMMIES PO) Take 2 each by mouth daily. 180 mg    . atorvastatin (LIPITOR) 40 MG tablet Take 1 tablet (40 mg total) by mouth daily. 30 tablet 1  . b complex vitamins capsule Take 1 capsule by mouth daily.    . carvedilol (COREG) 6.25 MG tablet Take 1 tablet (6.25 mg total) by mouth 2 (two) times daily. 180 tablet 3  . cholecalciferol (VITAMIN D3) 25 MCG (1000 UNIT) tablet Take 1,000 Units by mouth daily.    . clopidogrel (PLAVIX) 75 MG tablet Take 1 tablet by mouth once daily with breakfast 90 tablet 2  . Coenzyme Q10 (CO Q 10) 100 MG CAPS Take 100 mg by mouth daily.    . furosemide (LASIX) 20 MG tablet Take 1-2 tablets (20-40 mg total) by mouth every other day. 135 tablet 2  . glipiZIDE (GLUCOTROL XL) 5 MG 24 hr tablet Take 5 mg by mouth every evening.    . hydrALAZINE (APRESOLINE) 10 MG tablet Take 1 tablet (10 mg total) by mouth every 8 (eight) hours. 90 tablet 0  . MEGARED OMEGA-3 KRILL OIL PO Take 1,000 mg by mouth daily. When she remembers    .  nitroGLYCERIN (NITROSTAT) 0.4 MG SL tablet Place 1 tablet (0.4 mg total) under the tongue every 5 (five) minutes x 3 doses as needed for chest pain. 25 tablet 3  . Polyethyl Glycol-Propyl Glycol (SYSTANE OP) Apply 1 drop to eye 3 (three) times daily as needed (dry eyes).    . vitamin E 180 MG (400 UNITS) capsule Take 400 Units by mouth daily.    Marland Kitchen warfarin (COUMADIN) 5 MG tablet Take 5 mg by mouth every evening.    . isosorbide mononitrate (IMDUR) 30 MG 24 hr tablet Take 0.5 tablets (15 mg total) by mouth daily. 15 tablet 0   No facility-administered medications prior to visit.     Allergies:   Patient  has no known allergies.   Social History   Socioeconomic History  . Marital status: Married    Spouse name: Not on file  . Number of children: 3  . Years of education: Some college  . Highest education level: Not on file  Occupational History  . Occupation: Retired  Tobacco Use  . Smoking status: Former Smoker    Years: 10.00    Types: Cigarettes    Quit date: 10/14/1978    Years since quitting: 42.4  . Smokeless tobacco: Never Used  Vaping Use  . Vaping Use: Never used  Substance and Sexual Activity  . Alcohol use: No  . Drug use: No  . Sexual activity: Not on file  Other Topics Concern  . Not on file  Social History Narrative   Lives in [place].  Her college aged grandson lives with her.  She ambulates with a walker and thus does not routinely exercise.   Social Determinants of Health   Financial Resource Strain: Not on file  Food Insecurity: Not on file  Transportation Needs: Not on file  Physical Activity: Not on file  Stress: Not on file  Social Connections: Not on file     Family History:  The patient's family history includes Arthritis in an other family member; Cancer in an other family member; Diabetes in an other family member; Heart disease in an other family member; Heart failure in her mother.   Review of Systems:    Please see the history of present  illness.     All other systems reviewed and are otherwise negative except as noted above.   Physical Exam:    VS:  BP (!) 146/84   Pulse 76   Ht 5' 5.5" (1.664 m)   Wt 189 lb (85.7 kg)   SpO2 98%   BMI 30.97 kg/m    General: Pleasant elderly female appearing in no acute distress. Head: Normocephalic, atraumatic. Neck: No carotid bruits. JVD not elevated.  Lungs: Respirations regular and unlabored, without wheezes or rales.  Heart: Regular rate and rhythm. 2/6 SEM along RUSB.  Abdomen: Appears non-distended. No obvious abdominal masses. Msk:  Strength and tone appear normal for age. No obvious joint deformities or effusions. Extremities: No clubbing or cyanosis. No pitting edema.  Distal pedal pulses are 2+ bilaterally. Neuro: Alert and oriented X 3. Moves all extremities spontaneously. No focal deficits noted. Psych:  Responds to questions appropriately with a normal affect. Skin: No rashes or lesions noted  Wt Readings from Last 3 Encounters:  03/08/21 189 lb (85.7 kg)  02/13/21 189 lb (85.7 kg)  02/06/21 186 lb 1.6 oz (84.4 kg)     Studies/Labs Reviewed:   EKG:  EKG is not ordered today.    Recent Labs: 07/14/2020: TSH 2.309 01/30/2021: B Natriuretic Peptide 587.4 02/06/2021: ALT 19; BUN 50; Creatinine, Ser 1.90; Hemoglobin 9.2; Magnesium 2.0; Platelets 290; Potassium 4.2; Sodium 142   Lipid Panel    Component Value Date/Time   CHOL 177 07/14/2020 0038   TRIG 78 07/14/2020 0212   HDL 62 07/14/2020 0038   CHOLHDL 2.9 07/14/2020 0038   VLDL 24 07/14/2020 0038   LDLCALC 91 07/14/2020 0038    Additional studies/ records that were reviewed today include:   Echocardiogram: 01/2021 IMPRESSIONS    1. Left ventricular ejection fraction, by estimation, is 40 to 45%. Left  ventricular ejection fraction by 3D volume is 42 %. The left ventricle has  mildly decreased function. The left ventricle demonstrates global  hypokinesis. The left ventricular  internal cavity  size was severely dilated. There is severe left  ventricular hypertrophy of the basal-septal segment. Left ventricular  diastolic parameters are consistent with Grade II diastolic dysfunction  (pseudonormalization). Elevated left ventricular  end-diastolic pressure. The average left ventricular global longitudinal  strain is -9.1 %. The global longitudinal strain is abnormal.  2. Right ventricular systolic function is normal. The right ventricular  size is normal. There is mildly elevated pulmonary artery systolic  pressure. The estimated right ventricular systolic pressure is [id number] mmHg.  3. Left atrial size was severely dilated.  4. The mitral valve is degenerative. Mild mitral valve regurgitation.  Severe mitral annular calcification.The posterior MV leaflet appears fixed  and immobile.  5. The aortic valve is calcified. There is severe calcifcation of the  aortic valve. There is severe thickening of the aortic valve. Aortic valve  regurgitation is not visualized. Mild to moderate aortic valve stenosis.  Aortic valve mean gradient measures  11.0 mmHg. Aortic valve Vmax measures 2.16 m/s. In the setting of LV  dysfunction the AV gradient may be underestimated and likely is more on  the moderate AS side.  6. The inferior vena cava is normal in size with greater than 50%  respiratory variability, suggesting right atrial pressure of 3  mmHg.Compared to prior echo 2021, LVF appears similar, AV mean gradient  with no signfiicant change.   R/LHC: 01/2021  Previously placed Prox RCA to Dist RCA stent (unknown type) is widely patent.  Previously placed Mid LAD stent (unknown type) is widely patent.  1st Diag lesion is 75% stenosed.  Lat Ramus lesion is 99% stenosed.  Ramus lesion is 50% stenosed.  Ost Cx to Prox Cx lesion is 50% stenosed.  Mid LM to Dist LM lesion is 30% stenosed.  Hemodynamic findings consistent with aortic valve stenosis.  IMPRESSION: Ms. Speciale's LAD  and RCA stents are widely patent.  She has moderate disease in a small ramus and nondominant circumflex.  Her pulmonary capillary wedge pressure was moderately elevated.  I believe her symptoms are related to her aortic stenosis which I suspect is more severe than the ultrasound indicates probably from "low output" aortic stenosis.  She may be a candidate for TAVR however she does have moderate renal insufficiency and cognitive impairment.  A right common femoral angiogram was performed and [name] closure devices were successfully deployed in the right common femoral artery and vein achieving hemostasis.  The patient left lab in stable condition.    Assessment:    1. Coronary artery disease involving native coronary artery of native heart without angina pectoris   2. Chronic combined systolic and diastolic heart failure ([place])   3. Atrial flutter, unspecified type ([place])   4. Nonrheumatic aortic valve stenosis   5. Stage 3b chronic kidney disease ([place])      Plan:   In order of problems listed above:  1. CAD - She is s/p DES to mid-RCA and DES to mid-LAD in 2006, DES to ISR of RCA in 2009, inferior STEMI in 07/2020 with ISR of RCA and treated with DES and most recently was admitted for an NSTEMI in 01/2021 with cath showing patent stents as outlined above.  - She denies any recent chest pain or progressive dyspnea on exertion.  - Continue current medication regimen with Plavix '75mg'$  daily, Coreg 6.'25mg'$  BID and Atorvastatin '40mg'$  daily. Not on ASA due to the use of Plavix and Coumadin.   2. HFrEF/ICM  - Her EF  was at 40-45% by echo in 01/2021. She denies any specific orthopnea, PND or lower extremity edema and appears euvolemic on examination today.  - Will continue Lasix '40mg'$  daily alternating with '20mg'$  daily. She did have labs with her PCP earlier this month and I will request a copy (her daughter thinks her creatinine was around 1.7 when they were called with results).  - Continue Coreg 6.'25mg'$   BID and Hydralazine '10mg'$  TID. She is on Imdur '15mg'$  daily and will titrate to '30mg'$  daily. I have asked them to keep a BP log as hopefully we can further titrate Hydralazine if BP allows. She is not on an ACE-I/ARB/ARNI/Spiro due to her variable renal function. GFR has been less than 30, therefore would not add SGLT2 inhibitor.   3. Atrial Flutter - She is s/p ablation in 2014. Denies any recent palpitations. Continue Coreg 6.'25mg'$  BID for rate-control. - No reports of active bleeding. She remains on Coumadin for anticoagulation. INR was at 2.2 when checked earlier this month.  4. Aortic Stenosis - Mild to moderate by most recent echocardiogram. Would plan for repeat imaging in 6 months.   5. Stage 3-4 CKD - Her creatinine was at 1.7 - 1.9 most recently. She did have labs with her PCP within the past 2 weeks and I will request a copy of these given the recent titration of Lasix.     Medication Adjustments/Labs and Tests Ordered: Current medicines are reviewed at length with the patient today.  Concerns regarding medicines are outlined above.  Medication changes, Labs and Tests ordered today are listed in the Patient Instructions below. Patient Instructions  Medication Instructions:  Your physician has recommended you make the following change in your medication:  INCREASE Imdur (Isorsobide Mononitrate) to 30 mg daily.  *If you need a refill on your cardiac medications before your next appointment, please call your pharmacy*   Lab Work: We have requested lab work from your primary care provider If you have labs (blood work) drawn today and your tests are completely normal, you will receive your results only by: Marland Kitchen MyChart Message (if you have MyChart) OR . A paper copy in the mail If you have any lab test that is abnormal or we need to change your treatment, we will call you to review the results.   Testing/Procedures: None   Follow-Up: At [facility], you and your health needs are  our priority.  As part of our continuing mission to provide you with exceptional heart care, we have created designated Provider Care Teams.  These Care Teams include your primary Cardiologist (physician) and Advanced Practice Providers (APPs -  Physician Assistants and Nurse Practitioners) who all work together to provide you with the care you need, when you need it.  We recommend signing up for the patient portal called "MyChart".  Sign up information is provided on this After Visit Summary.  MyChart is used to connect with patients for Virtual Visits (Telemedicine).  Patients are able to view lab/test results, encounter notes, upcoming appointments, etc.  Non-urgent messages can be sent to your provider as well.   To learn more about what you can do with MyChart, go to [email or web address].    Your next appointment:   2 month(s)  The format for your next appointment:   In Person  Provider:   Bernerd Pho, PA-C   Other Instructions       Signed, Erma Heritage, PA-C  03/08/2021 8:07 PM    [facility]  [address] [place], [place] [address] Phone: [phone] Fax: [phone]

## 2021-03-09 ENCOUNTER — Other Ambulatory Visit: Payer: Self-pay

## 2021-03-09 DIAGNOSIS — I110 Hypertensive heart disease with heart failure: Secondary | ICD-10-CM | POA: Diagnosis not present

## 2021-03-09 DIAGNOSIS — J069 Acute upper respiratory infection, unspecified: Secondary | ICD-10-CM | POA: Diagnosis not present

## 2021-03-09 DIAGNOSIS — I2111 ST elevation (STEMI) myocardial infarction involving right coronary artery: Secondary | ICD-10-CM | POA: Diagnosis not present

## 2021-03-09 DIAGNOSIS — J189 Pneumonia, unspecified organism: Secondary | ICD-10-CM | POA: Diagnosis not present

## 2021-03-09 DIAGNOSIS — J9601 Acute respiratory failure with hypoxia: Secondary | ICD-10-CM | POA: Diagnosis not present

## 2021-03-09 DIAGNOSIS — I5031 Acute diastolic (congestive) heart failure: Secondary | ICD-10-CM | POA: Diagnosis not present

## 2021-03-09 MED ORDER — HYDRALAZINE HCL 10 MG PO TABS: 10.0000 mg | ORAL_TABLET | Freq: Three times a day (TID) | ORAL | 6 refills | Status: DC

## 2021-03-09 NOTE — Telephone Encounter (Signed)
Received request from [facility] to refill hydralazine 10 mg q 8 hrs, #90, RF:3 e-scribed.

## 2021-03-11 DIAGNOSIS — E119 Type 2 diabetes mellitus without complications: Secondary | ICD-10-CM | POA: Diagnosis not present

## 2021-03-11 DIAGNOSIS — J069 Acute upper respiratory infection, unspecified: Secondary | ICD-10-CM | POA: Diagnosis not present

## 2021-03-11 DIAGNOSIS — M1990 Unspecified osteoarthritis, unspecified site: Secondary | ICD-10-CM | POA: Diagnosis not present

## 2021-03-11 DIAGNOSIS — Z7984 Long term (current) use of oral hypoglycemic drugs: Secondary | ICD-10-CM | POA: Diagnosis not present

## 2021-03-11 DIAGNOSIS — J9601 Acute respiratory failure with hypoxia: Secondary | ICD-10-CM | POA: Diagnosis not present

## 2021-03-11 DIAGNOSIS — L89151 Pressure ulcer of sacral region, stage 1: Secondary | ICD-10-CM | POA: Diagnosis not present

## 2021-03-11 DIAGNOSIS — I110 Hypertensive heart disease with heart failure: Secondary | ICD-10-CM | POA: Diagnosis not present

## 2021-03-11 DIAGNOSIS — Z7902 Long term (current) use of antithrombotics/antiplatelets: Secondary | ICD-10-CM | POA: Diagnosis not present

## 2021-03-11 DIAGNOSIS — E785 Hyperlipidemia, unspecified: Secondary | ICD-10-CM | POA: Diagnosis not present

## 2021-03-11 DIAGNOSIS — J189 Pneumonia, unspecified organism: Secondary | ICD-10-CM | POA: Diagnosis not present

## 2021-03-11 DIAGNOSIS — I5031 Acute diastolic (congestive) heart failure: Secondary | ICD-10-CM | POA: Diagnosis not present

## 2021-03-11 DIAGNOSIS — I2510 Atherosclerotic heart disease of native coronary artery without angina pectoris: Secondary | ICD-10-CM | POA: Diagnosis not present

## 2021-03-11 DIAGNOSIS — I350 Nonrheumatic aortic (valve) stenosis: Secondary | ICD-10-CM | POA: Diagnosis not present

## 2021-03-11 DIAGNOSIS — I2111 ST elevation (STEMI) myocardial infarction involving right coronary artery: Secondary | ICD-10-CM | POA: Diagnosis not present

## 2021-03-11 DIAGNOSIS — Z955 Presence of coronary angioplasty implant and graft: Secondary | ICD-10-CM | POA: Diagnosis not present

## 2021-03-11 DIAGNOSIS — Z7901 Long term (current) use of anticoagulants: Secondary | ICD-10-CM | POA: Diagnosis not present

## 2021-03-11 DIAGNOSIS — I4891 Unspecified atrial fibrillation: Secondary | ICD-10-CM | POA: Diagnosis not present

## 2021-03-15 DIAGNOSIS — J9601 Acute respiratory failure with hypoxia: Secondary | ICD-10-CM | POA: Diagnosis not present

## 2021-03-15 DIAGNOSIS — I5031 Acute diastolic (congestive) heart failure: Secondary | ICD-10-CM | POA: Diagnosis not present

## 2021-03-15 DIAGNOSIS — I110 Hypertensive heart disease with heart failure: Secondary | ICD-10-CM | POA: Diagnosis not present

## 2021-03-15 DIAGNOSIS — J189 Pneumonia, unspecified organism: Secondary | ICD-10-CM | POA: Diagnosis not present

## 2021-03-15 DIAGNOSIS — I2111 ST elevation (STEMI) myocardial infarction involving right coronary artery: Secondary | ICD-10-CM | POA: Diagnosis not present

## 2021-03-15 DIAGNOSIS — J069 Acute upper respiratory infection, unspecified: Secondary | ICD-10-CM | POA: Diagnosis not present

## 2021-03-16 DIAGNOSIS — I2111 ST elevation (STEMI) myocardial infarction involving right coronary artery: Secondary | ICD-10-CM | POA: Diagnosis not present

## 2021-03-16 DIAGNOSIS — J9601 Acute respiratory failure with hypoxia: Secondary | ICD-10-CM | POA: Diagnosis not present

## 2021-03-16 DIAGNOSIS — J189 Pneumonia, unspecified organism: Secondary | ICD-10-CM | POA: Diagnosis not present

## 2021-03-16 DIAGNOSIS — I110 Hypertensive heart disease with heart failure: Secondary | ICD-10-CM | POA: Diagnosis not present

## 2021-03-16 DIAGNOSIS — J069 Acute upper respiratory infection, unspecified: Secondary | ICD-10-CM | POA: Diagnosis not present

## 2021-03-16 DIAGNOSIS — I5031 Acute diastolic (congestive) heart failure: Secondary | ICD-10-CM | POA: Diagnosis not present

## 2021-03-21 DIAGNOSIS — I5031 Acute diastolic (congestive) heart failure: Secondary | ICD-10-CM | POA: Diagnosis not present

## 2021-03-21 DIAGNOSIS — J189 Pneumonia, unspecified organism: Secondary | ICD-10-CM | POA: Diagnosis not present

## 2021-03-21 DIAGNOSIS — I2111 ST elevation (STEMI) myocardial infarction involving right coronary artery: Secondary | ICD-10-CM | POA: Diagnosis not present

## 2021-03-21 DIAGNOSIS — J9601 Acute respiratory failure with hypoxia: Secondary | ICD-10-CM | POA: Diagnosis not present

## 2021-03-21 DIAGNOSIS — J069 Acute upper respiratory infection, unspecified: Secondary | ICD-10-CM | POA: Diagnosis not present

## 2021-03-21 DIAGNOSIS — I110 Hypertensive heart disease with heart failure: Secondary | ICD-10-CM | POA: Diagnosis not present

## 2021-03-22 ENCOUNTER — Ambulatory Visit (INDEPENDENT_AMBULATORY_CARE_PROVIDER_SITE_OTHER): Payer: Medicare Other | Admitting: *Deleted

## 2021-03-22 DIAGNOSIS — I4892 Unspecified atrial flutter: Secondary | ICD-10-CM

## 2021-03-22 DIAGNOSIS — Z5181 Encounter for therapeutic drug level monitoring: Secondary | ICD-10-CM | POA: Diagnosis not present

## 2021-03-22 LAB — POCT INR: INR: 1.6 — AB (ref 2.0–3.0)

## 2021-03-22 NOTE — Patient Instructions (Signed)
Take warfarin 1 1/2 tablets tonight and tomorrow night then resume 1 tablet daily  Recheck in 3 wk in office

## 2021-03-23 DIAGNOSIS — J069 Acute upper respiratory infection, unspecified: Secondary | ICD-10-CM | POA: Diagnosis not present

## 2021-03-23 DIAGNOSIS — J9601 Acute respiratory failure with hypoxia: Secondary | ICD-10-CM | POA: Diagnosis not present

## 2021-03-23 DIAGNOSIS — J189 Pneumonia, unspecified organism: Secondary | ICD-10-CM | POA: Diagnosis not present

## 2021-03-23 DIAGNOSIS — I110 Hypertensive heart disease with heart failure: Secondary | ICD-10-CM | POA: Diagnosis not present

## 2021-03-23 DIAGNOSIS — I2111 ST elevation (STEMI) myocardial infarction involving right coronary artery: Secondary | ICD-10-CM | POA: Diagnosis not present

## 2021-03-23 DIAGNOSIS — I5031 Acute diastolic (congestive) heart failure: Secondary | ICD-10-CM | POA: Diagnosis not present

## 2021-03-28 DIAGNOSIS — I5031 Acute diastolic (congestive) heart failure: Secondary | ICD-10-CM | POA: Diagnosis not present

## 2021-03-28 DIAGNOSIS — J189 Pneumonia, unspecified organism: Secondary | ICD-10-CM | POA: Diagnosis not present

## 2021-03-28 DIAGNOSIS — J069 Acute upper respiratory infection, unspecified: Secondary | ICD-10-CM | POA: Diagnosis not present

## 2021-03-28 DIAGNOSIS — I2111 ST elevation (STEMI) myocardial infarction involving right coronary artery: Secondary | ICD-10-CM | POA: Diagnosis not present

## 2021-03-28 DIAGNOSIS — J9601 Acute respiratory failure with hypoxia: Secondary | ICD-10-CM | POA: Diagnosis not present

## 2021-03-28 DIAGNOSIS — I110 Hypertensive heart disease with heart failure: Secondary | ICD-10-CM | POA: Diagnosis not present

## 2021-04-12 ENCOUNTER — Ambulatory Visit (INDEPENDENT_AMBULATORY_CARE_PROVIDER_SITE_OTHER): Payer: Medicare Other | Admitting: *Deleted

## 2021-04-12 DIAGNOSIS — Z5181 Encounter for therapeutic drug level monitoring: Secondary | ICD-10-CM | POA: Diagnosis not present

## 2021-04-12 DIAGNOSIS — I4892 Unspecified atrial flutter: Secondary | ICD-10-CM | POA: Diagnosis not present

## 2021-04-12 LAB — POCT INR: INR: 3.1 — AB (ref 2.0–3.0)

## 2021-04-12 NOTE — Patient Instructions (Signed)
Continue warfarin 1 tablet daily  Recheck in 4 wk in office

## 2021-05-03 NOTE — Progress Notes (Signed)
Cardiology Office Note    Date:  05/04/2021   ID:  TENEQUA [name], DOB 08-Feb-1941, MRN [id number]  PCP:  Caitlin Squibb, MD  Cardiologist: Caitlin Lesches, MD    Chief Complaint  Patient presents with   Follow-up    2 month visit    History of Present Illness:    [name] is a 80 y.o. female with past medical history of CAD (s/p DES to mid-RCA and DES to mid-LAD in 2006, DES to ISR of RCA in 2009, inferior STEMI in 07/2020 with ISR of RCA and treated with DESs/p NSTEMI in 01/2021 with cath showing patent stents with no culprit lesions), HFrEF/ICM (EF 40-45% by echo in 01/2021), atrial flutter (s/p ablation in 2014), history of PE (on Coumadin), HTN, HLD, Type 2 DM, Stage 3 CKD, prior CVA and aortic stenosis who presents to the office today for 52-monthfollow-up.  She was last examined by myself in 02/2021 and reported still having fatigue at that time but felt like her dyspnea had improved since her prior visit with Dr. MDomenic Politeduring which her Lasix had been titrated to 20 mg daily alternating with 40 mg daily. She was currently on Coreg and Hydralazine for cardiomyopathy and Imdur was titrated from 15 mg daily to 30 mg daily. It was previously recommended that she have a repeat echocardiogram in 6 months for reassessment of her aortic stenosis.  In talking with the patient and her daughter today, she reports overall doing well since her last office visit. [facility] [place] and is enjoying the social interactions. She feels like she is also more physically active when getting out of the house. Reports her breathing has been stable and denies any specific orthopnea, PND or pitting edema. No recent chest pain or palpitations.  Her blood pressure has been well controlled [facility] at home [facility]    Past Medical History:  Diagnosis Date   Arthritis    Atrial flutter ([place]    a. 06/2013 s/p RFCA.    Bilateral pulmonary embolism ([place] 08/2008   a. Chronic coumadin.   Carotid arterial disease ([place]    a. 03/2017 Carotid angio: RICA 10000000 LICA [id number]   CKD (chronic kidney disease), stage III (HCC)    Coronary artery disease    a. 05/2005 Inf STEMI/PCI: severe LAD/LCX/RCA dzs->CABG advised, pt preferred PCI-->Cypher DES to mRCA & mLAD; b. 02/2008 PCI: ISR of RCA->Cypher DES. LAD patent; c. 07/2020 Inf STEMI/PCI: LM 30d, LAD 264mD1 80(small), RI 75, LCX 80ost->distal (small), RCA 25p/m, 10050m(3.0x26 Resolute Onyx DES).   Essential hypertension    HFrEF (heart failure with reduced ejection fraction) ([place]  a. 07/2020 Echo: EF 45-50% in setting of inf STEMI; b. 01/2021 Echo: EF 40-45%, glob HK, sev basal-septal LVH w/ grII DD. Nl RV fxn, RVSP 37.1mm41m sev dil LA, mild MR, mod AS.   Hyperlipidemia    Ischemic cardiomyopathy    a. 07/2020 Echo: EF 45-50%; b. 01/2021 Echo: EF 40-45%.   Memory disorder 07/08/2017   Moderate aortic stenosis    a. 01/2021 Echo: Mild-mod AS w/ mean grad 11mm24mAoV area 2.50m/s9max). In setting of LV dysfxn, AS likely more moderate.   [facility]HCC[facility]  [facility] [place] [facility]:  Procedure Laterality  Date   ATRIAL FLUTTER ABLATION N/A 06/17/2013   Procedure: ATRIAL FLUTTER ABLATION;  Surgeon: Thompson Grayer, MD;  Location: [facility] CATH LAB;  Service: Cardiovascular;  Laterality: N/A;   CAROTID PTA/STENT INTERVENTION N/A 04/10/2017   Procedure: Carotid PTA/Stent Intervention;  Surgeon: Algernon Huxley, MD;  Location: [place] CV LAB;  Service: Cardiovascular;  Laterality: N/A;   CHOLECYSTECTOMY     CORONARY STENT INTERVENTION N/A 07/14/2020   Procedure: CORONARY STENT INTERVENTION;  Surgeon: Caitlin Mocha, MD;  Location: [place] CV LAB;  Service: Cardiovascular;  Laterality: N/A;   HIP SURGERY     KNEE ARTHROSCOPY WITH MEDIAL MENISECTOMY Left 09/25/2017   Procedure: KNEE  ARTHROSCOPY WITH MEDIAL AND LATERAL MENISECTOMY;  Surgeon: Caitlin Civil, MD;  Location: [facility]S;  Service: Orthopedics;  Laterality: Left;   LEFT HEART CATH AND CORONARY ANGIOGRAPHY N/A 07/14/2020   Procedure: LEFT HEART CATH AND CORONARY ANGIOGRAPHY;  Surgeon: Caitlin Mocha, MD;  Location: [place] CV LAB;  Service: Cardiovascular;  Laterality: N/A;   RIGHT/LEFT HEART CATH AND CORONARY ANGIOGRAPHY N/A 02/05/2021   Procedure: RIGHT/LEFT HEART CATH AND CORONARY ANGIOGRAPHY;  Surgeon: Caitlin Harp, MD;  Location: [place] CV LAB;  Service: Cardiovascular;  Laterality: N/A;    Current Medications: Outpatient Medications Prior to Visit  Medication Sig Dispense Refill   albuterol (VENTOLIN HFA) 108 (90 Base) MCG/ACT inhaler Inhale 2 puffs into the lungs every 6 (six) hours as needed for wheezing or shortness of breath. 8 g 2   ALPRAZolam (XANAX) 0.25 MG tablet Take 0.125-0.25 mg by mouth See admin instructions. 0.125 in the morning. 0.25 mg at bedtime     Ascorbic Acid (VITAMIN C GUMMIES PO) Take 2 each by mouth daily. 180 mg     atorvastatin (LIPITOR) 40 MG tablet Take 1 tablet (40 mg total) by mouth daily. 30 tablet 1   b complex vitamins capsule Take 1 capsule by mouth daily.     carvedilol (COREG) 6.25 MG tablet Take 1 tablet (6.25 mg total) by mouth 2 (two) times daily. 180 tablet 3   cholecalciferol (VITAMIN D3) 25 MCG (1000 UNIT) tablet Take 1,000 Units by mouth daily.     clopidogrel (PLAVIX) 75 MG tablet Take 1 tablet by mouth once daily with breakfast 90 tablet 2   Coenzyme Q10 (CO Q 10) 100 MG CAPS Take 100 mg by mouth daily.     furosemide (LASIX) 20 MG tablet Take 1-2 tablets (20-40 mg total) by mouth every other day. 135 tablet 2   glipiZIDE (GLUCOTROL XL) 5 MG 24 hr tablet Take 5 mg by mouth every evening.     hydrALAZINE (APRESOLINE) 10 MG tablet Take 1 tablet (10 mg total) by mouth every 8 (eight) hours. 90 tablet 6   isosorbide mononitrate (IMDUR) 30 MG 24 hr  tablet Take 1 tablet (30 mg total) by mouth daily. 90 tablet 3   MEGARED OMEGA-3 KRILL OIL PO Take 1,000 mg by mouth daily. When she remembers     nitroGLYCERIN (NITROSTAT) 0.4 MG SL tablet Place 1 tablet (0.4 mg total) under the tongue every 5 (five) minutes x 3 doses as needed for chest pain. 25 tablet 3   Polyethyl Glycol-Propyl Glycol (SYSTANE OP) Apply 1 drop to eye 3 (three) times daily as needed (dry eyes).     vitamin E 180 MG (400 UNITS) capsule Take 400 Units by mouth daily.     warfarin (COUMADIN) 5 MG tablet Take 5 mg by mouth every evening.  No facility-administered medications prior to visit.     Allergies:   Patient has no known allergies.   Social History   Socioeconomic History   Marital status: Married    Spouse name: Not on file   Number of children: 3   Years of education: Some college   Highest education level: Not on file  Occupational History   Occupation: Retired  Tobacco Use   Smoking status: Former    Years: 10.00    Types: Cigarettes    Quit date: 10/14/1978    Years since quitting: 42.5   Smokeless tobacco: Never  Vaping Use   Vaping Use: Never used  Substance and Sexual Activity   Alcohol use: No   Drug use: No   Sexual activity: Not on file  Other Topics Concern   Not on file  Social History Narrative   Lives in [place].  Her college aged grandson lives with her.  She ambulates with a walker and thus does not routinely exercise.   Social Determinants of Health   Financial Resource Strain: Not on file  Food Insecurity: Not on file  Transportation Needs: Not on file  Physical Activity: Not on file  Stress: Not on file  Social Connections: Not on file     Family History:  The patient's family history includes Arthritis in an other family member; Cancer in an other family member; Diabetes in an other family member; Heart disease in an other family member; Heart failure in her mother.   Review of Systems:    Please see the history of  present illness.     All other systems reviewed and are otherwise negative except as noted above.   Physical Exam:    VS:  BP 122/60   Pulse 72   Wt 193 lb (87.5 kg)   SpO2 98%   BMI 31.63 kg/m    General: Well developed, well nourished,female appearing in no acute distress. Head: Normocephalic, atraumatic. Neck: No carotid bruits. JVD not elevated.  Lungs: Respirations regular and unlabored, without wheezes or rales.  Heart: Regular rate and rhythm. No S3 or S4.  2/6 SEM along RUSB.  Abdomen: Appears non-distended. No obvious abdominal masses. Msk:  Strength and tone appear normal for age. No obvious joint deformities or effusions. Extremities: No clubbing or cyanosis. No pitting edema.  Distal pedal pulses are 2+ bilaterally. Neuro: Alert and oriented X 3. Moves all extremities spontaneously. No focal deficits noted. Psych:  Responds to questions appropriately with a normal affect. Skin: No rashes or lesions noted  Wt Readings from Last 3 Encounters:  05/04/21 193 lb (87.5 kg)  03/08/21 189 lb (85.7 kg)  02/13/21 189 lb (85.7 kg)     Studies/Labs Reviewed:   EKG:  EKG is not ordered today.    Recent Labs: 07/14/2020: TSH 2.309 01/30/2021: B Natriuretic Peptide 587.4 02/06/2021: ALT 19; BUN 50; Creatinine, Ser 1.90; Hemoglobin 9.2; Magnesium 2.0; Platelets 290; Potassium 4.2; Sodium 142   Lipid Panel    Component Value Date/Time   CHOL 177 07/14/2020 0038   TRIG 78 07/14/2020 0212   HDL 62 07/14/2020 0038   CHOLHDL 2.9 07/14/2020 0038   VLDL 24 07/14/2020 0038   LDLCALC 91 07/14/2020 0038    Additional studies/ records that were reviewed today include:   Echocardiogram: 01/2021 IMPRESSIONS     1. Left ventricular ejection fraction, by estimation, is 40 to 45%. Left  ventricular ejection fraction by 3D volume is 42 %. The left ventricle has  mildly decreased function. The left ventricle demonstrates global  hypokinesis. The left ventricular  internal cavity  size was severely dilated. There is severe left  ventricular hypertrophy of the basal-septal segment. Left ventricular  diastolic parameters are consistent with Grade II diastolic dysfunction  (pseudonormalization). Elevated left ventricular   end-diastolic pressure. The average left ventricular global longitudinal  strain is -9.1 %. The global longitudinal strain is abnormal.   2. Right ventricular systolic function is normal. The right ventricular  size is normal. There is mildly elevated pulmonary artery systolic  pressure. The estimated right ventricular systolic pressure is [id number] mmHg.   3. Left atrial size was severely dilated.   4. The mitral valve is degenerative. Mild mitral valve regurgitation.  Severe mitral annular calcification.The posterior MV leaflet appears fixed  and immobile.   5. The aortic valve is calcified. There is severe calcifcation of the  aortic valve. There is severe thickening of the aortic valve. Aortic valve  regurgitation is not visualized. Mild to moderate aortic valve stenosis.  Aortic valve mean gradient measures   11.0 mmHg. Aortic valve Vmax measures 2.16 m/s. In the setting of LV  dysfunction the AV gradient may be underestimated and likely is more on  the moderate AS side.   6. The inferior vena cava is normal in size with greater than 50%  respiratory variability, suggesting right atrial pressure of 3  mmHg.Compared to prior echo 2021, LVF appears similar, AV mean gradient  with no signfiicant change.   Cardiac Catheterization: 01/2021 Previously placed Prox RCA to Dist RCA stent (unknown type) is widely patent. Previously placed Mid LAD stent (unknown type) is widely patent. 1st Diag lesion is 75% stenosed. Lat Ramus lesion is 99% stenosed. Ramus lesion is 50% stenosed. Ost Cx to Prox Cx lesion is 50% stenosed. Mid LM to Dist LM lesion is 30% stenosed. Hemodynamic findings consistent with aortic valve stenosis.  IMPRESSION: Ms. Petrich's LAD  and RCA stents are widely patent.  She has moderate disease in a small ramus and nondominant circumflex.  Her pulmonary capillary wedge pressure was moderately elevated.  I believe her symptoms are related to her aortic stenosis which I suspect is more severe than the ultrasound indicates probably from "low output" aortic stenosis.  She may be a candidate for TAVR however she does have moderate renal insufficiency and cognitive impairment.  A right common femoral angiogram was performed and [name] closure devices were successfully deployed in the right common femoral artery and vein achieving hemostasis.  The patient left lab in stable condition.  Assessment:    1. Coronary artery disease involving native coronary artery of native heart without angina pectoris   2. Chronic combined systolic and diastolic heart failure ([place])   3. Atrial flutter, unspecified type ([place])   4. Nonrheumatic aortic valve stenosis   5. Stage 3b chronic kidney disease ([place])      Plan:   In order of problems listed above:  1. CAD - She has multiple stents as outlined above with most recent catheterization in 01/2021 showing patent stents with no culprit lesions identified at that time. - She continues to increase her activity level and denies any recent anginal symptoms. - Continue Atorvastatin 40 mg daily, Coreg 6.25 mg twice daily, Plavix 75 mg daily and Imdur 30 mg daily.    2. HFrEF/ICM - Her EF was at 40-45% by echo in 01/2021. She reports her breathing has overall been stable and appears euvolemic by examination today. Will continue current medical  therapy with Lasix 40 mg daily alternating with 20 mg daily, Coreg 6.25 mg twice daily, Imdur 30 mg daily and Hydralazine 10 mg TID. She has not been started on an ACE-I/ARB/ARNI/Spiro/SGLT2 inhibitor given her variable renal function. Will plan for a repeat echocardiogram in approximately 3 months for reassessment of her EF.  3. Atrial Flutter - She is s/p ablation in  2014 and denies any recent palpitations. She is in normal sinus rhythm by examination today. Remains on Coreg 6.25 mg twice daily for rate-control. She is on Coumadin for anticoagulation given her prior atrial flutter and history of PE.  4. Aortic Stenosis - She had mild to moderate AS by echocardiogram in 01/2021. Will plan to reassess in approximately 3 more months given her upcoming repeat echo.  5. Stage 3-4 CKD - Creatinine was stable at 1.77 by most recent labs in 02/2021.   Medication Adjustments/Labs and Tests Ordered: Current medicines are reviewed at length with the patient today.  Concerns regarding medicines are outlined above.  Medication changes, Labs and Tests ordered today are listed in the Patient Instructions below. Patient Instructions  Medication Instructions:  Your physician recommends that you continue on your current medications as directed. Please refer to the Current Medication list given to you today.  *If you need a refill on your cardiac medications before your next appointment, please call your pharmacy*   Lab Work: NONE   If you have labs (blood work) drawn today and your tests are completely normal, you will receive your results only by: [place] (if you have MyChart) OR A paper copy in the mail If you have any lab test that is abnormal or we need to change your treatment, we will call you to review the results.   Testing/Procedures: Your physician has requested that you have an echocardiogram. Echocardiography is a painless test that uses sound waves to create images of your heart. It provides your doctor with information about the size and shape of your heart and how well your heart's chambers and valves are working. This procedure takes approximately one hour. There are no restrictions for this procedure.    Follow-Up: At [facility], you and your health needs are our priority.  As part of our continuing mission to provide you with  exceptional heart care, we have created designated Provider Care Teams.  These Care Teams include your primary Cardiologist (physician) and Advanced Practice Providers (APPs -  Physician Assistants and Nurse Practitioners) who all work together to provide you with the care you need, when you need it.  We recommend signing up for the patient portal called "MyChart".  Sign up information is provided on this After Visit Summary.  MyChart is used to connect with patients for Virtual Visits (Telemedicine).  Patients are able to view lab/test results, encounter notes, upcoming appointments, etc.  Non-urgent messages can be sent to your provider as well.   To learn more about what you can do with MyChart, go to [email or web address].    Your next appointment:   3 month(s)  The format for your next appointment:   In Person  Provider:   Rozann Lesches, MD   Other Instructions Thank you for choosing [facility]!     Signed, Erma Heritage, PA-C  05/04/2021 4:02 PM    [place] [address] [place], [place] [address] Phone: [phone] Fax: [phone]

## 2021-05-04 ENCOUNTER — Other Ambulatory Visit: Payer: Self-pay

## 2021-05-04 ENCOUNTER — Encounter: Payer: Self-pay | Admitting: Student

## 2021-05-04 ENCOUNTER — Ambulatory Visit (INDEPENDENT_AMBULATORY_CARE_PROVIDER_SITE_OTHER): Payer: Medicare Other | Admitting: Student

## 2021-05-04 VITALS — BP 122/60 | HR 72 | Wt 193.0 lb

## 2021-05-04 DIAGNOSIS — I350 Nonrheumatic aortic (valve) stenosis: Secondary | ICD-10-CM

## 2021-05-04 DIAGNOSIS — N1832 Chronic kidney disease, stage 3b: Secondary | ICD-10-CM

## 2021-05-04 DIAGNOSIS — I5042 Chronic combined systolic (congestive) and diastolic (congestive) heart failure: Secondary | ICD-10-CM

## 2021-05-04 DIAGNOSIS — I25119 Atherosclerotic heart disease of native coronary artery with unspecified angina pectoris: Secondary | ICD-10-CM | POA: Diagnosis not present

## 2021-05-04 DIAGNOSIS — I4892 Unspecified atrial flutter: Secondary | ICD-10-CM

## 2021-05-04 DIAGNOSIS — I2510 Atherosclerotic heart disease of native coronary artery without angina pectoris: Secondary | ICD-10-CM | POA: Diagnosis not present

## 2021-05-04 NOTE — Patient Instructions (Signed)
Medication Instructions:  Your physician recommends that you continue on your current medications as directed. Please refer to the Current Medication list given to you today.  *If you need a refill on your cardiac medications before your next appointment, please call your pharmacy*   Lab Work: NONE   If you have labs (blood work) drawn today and your tests are completely normal, you will receive your results only by: [place] (if you have MyChart) OR A paper copy in the mail If you have any lab test that is abnormal or we need to change your treatment, we will call you to review the results.   Testing/Procedures: Your physician has requested that you have an echocardiogram. Echocardiography is a painless test that uses sound waves to create images of your heart. It provides your doctor with information about the size and shape of your heart and how well your heart's chambers and valves are working. This procedure takes approximately one hour. There are no restrictions for this procedure.    Follow-Up: At [facility], you and your health needs are our priority.  As part of our continuing mission to provide you with exceptional heart care, we have created designated Provider Care Teams.  These Care Teams include your primary Cardiologist (physician) and Advanced Practice Providers (APPs -  Physician Assistants and Nurse Practitioners) who all work together to provide you with the care you need, when you need it.  We recommend signing up for the patient portal called "MyChart".  Sign up information is provided on this After Visit Summary.  MyChart is used to connect with patients for Virtual Visits (Telemedicine).  Patients are able to view lab/test results, encounter notes, upcoming appointments, etc.  Non-urgent messages can be sent to your provider as well.   To learn more about what you can do with MyChart, go to [email or web address].    Your next appointment:   3  month(s)  The format for your next appointment:   In Person  Provider:   Rozann Lesches, MD   Other Instructions Thank you for choosing [facility]!

## 2021-05-10 ENCOUNTER — Ambulatory Visit (INDEPENDENT_AMBULATORY_CARE_PROVIDER_SITE_OTHER): Payer: Medicare Other | Admitting: *Deleted

## 2021-05-10 ENCOUNTER — Other Ambulatory Visit: Payer: Self-pay

## 2021-05-10 DIAGNOSIS — I4892 Unspecified atrial flutter: Secondary | ICD-10-CM | POA: Diagnosis not present

## 2021-05-10 DIAGNOSIS — Z5181 Encounter for therapeutic drug level monitoring: Secondary | ICD-10-CM | POA: Diagnosis not present

## 2021-05-10 LAB — POCT INR: INR: 2.2 (ref 2.0–3.0)

## 2021-05-10 NOTE — Patient Instructions (Signed)
Continue warfarin 1 tablet daily  Recheck in 4 wk in office

## 2021-06-11 ENCOUNTER — Ambulatory Visit (INDEPENDENT_AMBULATORY_CARE_PROVIDER_SITE_OTHER): Payer: Medicare Other | Admitting: *Deleted

## 2021-06-11 ENCOUNTER — Other Ambulatory Visit: Payer: Self-pay

## 2021-06-11 DIAGNOSIS — I4892 Unspecified atrial flutter: Secondary | ICD-10-CM | POA: Diagnosis not present

## 2021-06-11 DIAGNOSIS — Z5181 Encounter for therapeutic drug level monitoring: Secondary | ICD-10-CM

## 2021-06-11 LAB — POCT INR: INR: 2.4 (ref 2.0–3.0)

## 2021-06-11 NOTE — Patient Instructions (Signed)
Continue warfarin 1 tablet daily  Recheck in 5 wk in office

## 2021-06-19 DIAGNOSIS — M7662 Achilles tendinitis, left leg: Secondary | ICD-10-CM | POA: Diagnosis not present

## 2021-06-19 DIAGNOSIS — M722 Plantar fascial fibromatosis: Secondary | ICD-10-CM | POA: Diagnosis not present

## 2021-06-19 DIAGNOSIS — M79672 Pain in left foot: Secondary | ICD-10-CM | POA: Diagnosis not present

## 2021-06-20 ENCOUNTER — Telehealth: Payer: Self-pay | Admitting: Cardiology

## 2021-06-20 NOTE — Telephone Encounter (Signed)
We generally recommend to continue anticoagulation uninterrupted for 1 or 2 dental extractions.

## 2021-06-20 NOTE — Telephone Encounter (Signed)
   Patient Name: [name]  DOB: 01/28/1941 MRN: [id number]  Primary Cardiologist: Rozann Lesches, MD  Chart reviewed as part of pre-operative protocol coverage.   Simple dental extractions of 1 or 2 teeth are considered low risk procedures per guidelines and generally do not require any specific cardiac clearance. It is also generally accepted that for simple extractions and dental cleanings, there is no need to interrupt blood thinner therapy.   SBE prophylaxis is not required for the patient from a cardiac standpoint.  I will route this recommendation to the requesting party via Epic fax function and remove from pre-op pool.  Please call with questions.  Ledora Bottcher, PA 06/20/2021, 6:39 PM

## 2021-06-20 NOTE — Telephone Encounter (Signed)
Faxed request received from [facility]. [facility], D.D.S. office. "Need to remove nos. 9 and 10 and add to partial.  Would you please advise [place] as to stoppage of blood thinner?"

## 2021-06-21 NOTE — Telephone Encounter (Signed)
This was addressed yesterday. Please reach out to the dental office and make sure they received my fax yesterday.

## 2021-06-21 NOTE — Telephone Encounter (Signed)
   [facility] Pre-operative Risk Assessment    Patient Name: [name]  DOB: [birth year] MRN: [id number]  HEARTCARE STAFF:  - IMPORTANT!!!!!! Under Visit Info/Reason for Call, type in Other and utilize the format Clearance MM/DD/YY or Clearance TBD. Do not use dashes or single digits. - Please review there is not already an duplicate clearance open for this procedure. - If request is for dental extraction, please clarify the # of teeth to be extracted. - If the patient is currently at the dentist's office, call Pre-Op Callback Staff (MA/nurse) to input urgent request.  - If the patient is not currently in the dentist office, please route to the Pre-Op pool.  Request for surgical clearance:  What type of surgery is being performed? 2 extractions   When is this surgery scheduled? TBD   What type of clearance is required (medical clearance vs. Pharmacy clearance to hold med vs. Both)? Both   Are there any medications that need to be held prior to surgery and how long? "Blood Thinners"  Practice name and name of physician performing surgery? Dr. Clemens Catholic  What is the office phone number? [phone]   7.   What is the office fax number? [phone]  8.   Anesthesia type (None, local, MAC, general) ? Local    [name] 06/21/2021, 8:57 AM  _________________________________________________________________   (provider comments below)

## 2021-06-27 DIAGNOSIS — I10 Essential (primary) hypertension: Secondary | ICD-10-CM | POA: Diagnosis not present

## 2021-06-27 DIAGNOSIS — E1122 Type 2 diabetes mellitus with diabetic chronic kidney disease: Secondary | ICD-10-CM | POA: Diagnosis not present

## 2021-06-30 DIAGNOSIS — D509 Iron deficiency anemia, unspecified: Secondary | ICD-10-CM | POA: Diagnosis not present

## 2021-06-30 DIAGNOSIS — E782 Mixed hyperlipidemia: Secondary | ICD-10-CM | POA: Diagnosis not present

## 2021-06-30 DIAGNOSIS — D631 Anemia in chronic kidney disease: Secondary | ICD-10-CM | POA: Diagnosis not present

## 2021-06-30 DIAGNOSIS — E6609 Other obesity due to excess calories: Secondary | ICD-10-CM | POA: Diagnosis not present

## 2021-06-30 DIAGNOSIS — Z0001 Encounter for general adult medical examination with abnormal findings: Secondary | ICD-10-CM | POA: Diagnosis not present

## 2021-06-30 DIAGNOSIS — F0150 Vascular dementia without behavioral disturbance: Secondary | ICD-10-CM | POA: Diagnosis not present

## 2021-06-30 DIAGNOSIS — I482 Chronic atrial fibrillation, unspecified: Secondary | ICD-10-CM | POA: Diagnosis not present

## 2021-06-30 DIAGNOSIS — R809 Proteinuria, unspecified: Secondary | ICD-10-CM | POA: Diagnosis not present

## 2021-06-30 DIAGNOSIS — Z23 Encounter for immunization: Secondary | ICD-10-CM | POA: Diagnosis not present

## 2021-06-30 DIAGNOSIS — N184 Chronic kidney disease, stage 4 (severe): Secondary | ICD-10-CM | POA: Diagnosis not present

## 2021-06-30 DIAGNOSIS — E1165 Type 2 diabetes mellitus with hyperglycemia: Secondary | ICD-10-CM | POA: Diagnosis not present

## 2021-06-30 DIAGNOSIS — Z8673 Personal history of transient ischemic attack (TIA), and cerebral infarction without residual deficits: Secondary | ICD-10-CM | POA: Diagnosis not present

## 2021-07-09 ENCOUNTER — Telehealth: Payer: Self-pay

## 2021-07-09 NOTE — Telephone Encounter (Signed)
We do not usually hold for dental extraction but she can hold Warfarin the night before procedure and resume night of procedure.  Thanks  Lattie Haw

## 2021-07-09 NOTE — Telephone Encounter (Signed)
Warfarin 

## 2021-07-09 NOTE — Telephone Encounter (Signed)
Dr. Dorthula Perfect' office called and wants permission to stop pt's eliquis for dental procedure. Please advise.

## 2021-07-11 DIAGNOSIS — D631 Anemia in chronic kidney disease: Secondary | ICD-10-CM | POA: Diagnosis not present

## 2021-07-11 DIAGNOSIS — Z79899 Other long term (current) drug therapy: Secondary | ICD-10-CM | POA: Diagnosis not present

## 2021-07-11 DIAGNOSIS — I129 Hypertensive chronic kidney disease with stage 1 through stage 4 chronic kidney disease, or unspecified chronic kidney disease: Secondary | ICD-10-CM | POA: Diagnosis not present

## 2021-07-11 DIAGNOSIS — N189 Chronic kidney disease, unspecified: Secondary | ICD-10-CM | POA: Diagnosis not present

## 2021-07-11 DIAGNOSIS — E1122 Type 2 diabetes mellitus with diabetic chronic kidney disease: Secondary | ICD-10-CM | POA: Diagnosis not present

## 2021-07-11 DIAGNOSIS — I5042 Chronic combined systolic (congestive) and diastolic (congestive) heart failure: Secondary | ICD-10-CM | POA: Diagnosis not present

## 2021-07-16 ENCOUNTER — Ambulatory Visit (HOSPITAL_COMMUNITY)
Admission: RE | Admit: 2021-07-16 | Discharge: 2021-07-16 | Disposition: A | Discharge status: Home / Self Care | Payer: Medicare Other | Source: Ambulatory Visit | Attending: Internal Medicine | Admitting: Internal Medicine

## 2021-07-16 ENCOUNTER — Other Ambulatory Visit: Payer: Self-pay

## 2021-07-16 DIAGNOSIS — I350 Nonrheumatic aortic (valve) stenosis: Secondary | ICD-10-CM | POA: Insufficient documentation

## 2021-07-16 LAB — ECHOCARDIOGRAM COMPLETE
AR max vel: 1.61 cm2
AV Area VTI: 1.49 cm2
AV Area mean vel: 1.48 cm2
AV Mean grad: 12.5 mmHg
AV Peak grad: 21 mmHg
Ao pk vel: 2.29 m/s
Area-P 1/2: 4.06 cm2
MV VTI: 1.73 cm2
S' Lateral: 4.7 cm

## 2021-07-16 NOTE — Progress Notes (Signed)
*  PRELIMINARY RESULTS* Echocardiogram 2D Echocardiogram has been performed.  [name] 07/16/2021, 11:27 AM

## 2021-07-23 ENCOUNTER — Ambulatory Visit (INDEPENDENT_AMBULATORY_CARE_PROVIDER_SITE_OTHER): Payer: Medicare Other | Admitting: *Deleted

## 2021-07-23 DIAGNOSIS — I4892 Unspecified atrial flutter: Secondary | ICD-10-CM

## 2021-07-23 DIAGNOSIS — Z5181 Encounter for therapeutic drug level monitoring: Secondary | ICD-10-CM

## 2021-07-23 LAB — POCT INR: INR: 3 (ref 2.0–3.0)

## 2021-07-23 NOTE — Patient Instructions (Signed)
Continue warfarin 1 tablet daily  Recheck in 6 wk in office

## 2021-07-30 NOTE — Progress Notes (Signed)
Virtual Visit via Telephone Note   This visit type was conducted due to national recommendations for restrictions regarding the COVID-19 Pandemic (e.g. social distancing) in an effort to limit this patient's exposure and mitigate transmission in our community.  Due to her co-morbid illnesses, this patient is at least at moderate risk for complications without adequate follow up.  This format is felt to be most appropriate for this patient at this time.  The patient did not have access to video technology/had technical difficulties with video requiring transitioning to audio format only (telephone).  All issues noted in this document were discussed and addressed.  No physical exam could be performed with this format.  Please refer to the patient's chart for her  consent to telehealth for [facility].    Date:  07/31/2021   ID:  [name], DOB 12-19-1940, MRN [id number] The patient was identified using 2 identifiers.  Patient Location: Home Provider Location: Office/Clinic  PCP:  Celene Squibb, MD   [facility] Providers Cardiologist:  Rozann Lesches, MD     Evaluation Performed:  Follow-Up Visit  Chief Complaint: Follow-up from recent echocardiogram  History of Present Illness:    [name] is a 80 y.o. female with past medical history of CAD (s/p DES to mid-RCA and DES to mid-LAD in 2006, DES to ISR of RCA in 2009, inferior STEMI in 07/2020 with ISR of RCA and treated with DES, s/p NSTEMI in 01/2021 with cath showing patent stents with no culprit lesions), HFrEF/ICM (EF 40-45% by echo in 01/2021), atrial flutter (s/p ablation in 2014), history of PE (on Coumadin), HTN, HLD, Type 2 DM, Stage 3 CKD, prior CVA and aortic stenosis who presents for a 47-monthfollow-up telehealth visit.   She was last examined by myself in 04/2021 and was doing well at that time and [facility] She denied any recent chest pain or progressive  dyspnea. She was continued on Lasix '40mg'$  daily alternating with '20mg'$  daily, Coreg, Imdur and Hydralazine as she was not on an ACE-I/ARB/ARNI/Sprio/SGLT2 inhibitor given her variable renal function. She did have a repeat echo in 07/2021 which showed her EF had improved to 45-50% with Grade 2 DD, a trivial pericardial effusion, mild MR and moderate AS.   During the telephone call today, most history is provided by the patient's daughter as the patient is tired given she had another medical visit earlier this morning with subsequent labs.  Her daughter reports she is overall doing very well and has not experienced any recent chest pain or dyspnea on exertion. No recent orthopnea or PND and she has been sleeping in her bed as compared to sleeping in the recliner or on the couch. [place] and enjoys the exercise and socialization she receives with this.  She has been taking Lasix 40 mg daily alternating with 20 mg daily but does take an extra 20 mg if needed for worsening edema or weight gain. She is followed by Dr. BTheador Hawthornefor CKD.   The patient does not have symptoms concerning for COVID-19 infection (fever, chills, cough, or new shortness of breath).    Past Medical History:  Diagnosis Date   Arthritis    Atrial flutter ([place] [place]    a. 06/2013 s/p RFCA.   Bilateral pulmonary embolism ([place] 08/2008   a. Chronic coumadin.   Carotid arterial disease ([facility]  a. 03/2017 Carotid angio: RICA 0000000, LICA [id number].   CKD (chronic kidney disease), stage III (HCC)    Coronary artery disease    a. 05/2005 Inf STEMI/PCI: severe LAD/LCX/RCA dzs->CABG advised, pt preferred PCI-->Cypher DES to mRCA & mLAD; b. 02/2008 PCI: ISR of RCA->Cypher DES. LAD patent; c. 07/2020 Inf STEMI/PCI: LM 30d, LAD 66m D1 80(small), RI 75, LCX 80ost->distal (small), RCA 25p/m, 1015m (3.0x26 Resolute Onyx DES).   Essential hypertension    HFrEF (heart failure with reduced ejection  fraction) ([place]   a. 07/2020 Echo: EF 45-50% in setting of inf STEMI; b. 01/2021 Echo: EF 40-45%, glob HK, sev basal-septal LVH w/ grII DD. Nl RV fxn, RVSP 37.56m32m, sev dil LA, mild MR, mod AS.   Hyperlipidemia    Ischemic cardiomyopathy    a. 07/2020 Echo: EF 45-50%; b. 01/2021 Echo: EF 40-45%.   Memory disorder 07/08/2017   Moderate aortic stenosis    a. 01/2021 Echo: Mild-mod AS w/ mean grad 156m74m AoV area 2.66m/356mmax). In setting of LV dysfxn, AS likely more moderate.   [facility] [place] [facility] [place]:  Procedure Laterality Date   ATRIAL FLUTTER ABLATION N/A 06/17/2013   Procedure: ATRIAL FLUTTER ABLATION;  Surgeon: JamesThompson Grayer  [facility];  Service: Cardiovascular;  Laterality: N/A;   CAROTID PTA/STENT INTERVENTION N/A 04/10/2017   Procedure: Carotid PTA/Stent Intervention;  Surgeon: Dew, Algernon Huxley  [facility];  Service: Cardiovascular;  Laterality: N/A;   CHOLECYSTECTOMY     CORONARY STENT INTERVENTION N/A 07/14/2020   Procedure: CORONARY STENT INTERVENTION;  Surgeon: CoopeSherren Mocha  [facility];  Service: Cardiovascular;  Laterality: N/A;   HIP SURGERY     KNEE ARTHROSCOPY WITH MEDIAL MENISECTOMY Left 09/25/2017   Procedure: KNEE ARTHROSCOPY WITH MEDIAL AND LATERAL MENISECTOMY;  Surgeon: HarriCarole Civil  Location: [facility];  Service: Orthopedics;  Laterality: Left;   LEFT HEART CATH AND CORONARY ANGIOGRAPHY N/A 07/14/2020   Procedure: LEFT HEART CATH AND CORONARY ANGIOGRAPHY;  Surgeon: CoopeSherren Mocha  [facility];  Service: Cardiovascular;  Laterality: N/A;   RIGHT/LEFT HEART CATH AND CORONARY ANGIOGRAPHY N/A 02/05/2021   Procedure: RIGHT/LEFT HEART CATH AND CORONARY ANGIOGRAPHY;  Surgeon: BerryLorretta Harp  [facility];  Service: Cardiovascular;  Laterality: N/A;      Current Meds  Medication Sig   ALPRAZolam (XANAX) 0.25 MG tablet Take 0.125-0.25 mg by mouth See admin instructions. 0.125 in the morning. 0.25 mg at bedtime   Ascorbic Acid (VITAMIN C GUMMIES PO) Take 2 each by mouth daily. 180 mg   atorvastatin (LIPITOR) 40 MG tablet Take 1 tablet (40 mg total) by mouth daily.   b complex vitamins capsule Take 1 capsule by mouth daily.   carvedilol (COREG) 6.25 MG tablet Take 1 tablet (6.25 mg total) by mouth 2 (two) times daily.   cholecalciferol (VITAMIN D3) 25 MCG (1000 UNIT) tablet Take 1,000 Units by mouth daily.   clopidogrel (PLAVIX) 75 MG tablet Take 1 tablet by mouth once daily with breakfast   Coenzyme Q10 (CO Q 10) 100 MG CAPS Take 100 mg by mouth daily.   furosemide (LASIX) 20 MG tablet Take 1-2 tablets (20-40 mg total) by mouth every other day. (Patient taking differently: Take 20-40 mg by mouth every other day. Takes  40 mg  on Tues, Thur.)   glipiZIDE (GLUCOTROL XL) 5 MG 24 hr tablet Take 5 mg by mouth in the morning and at bedtime.   hydrALAZINE (APRESOLINE) 10 MG tablet Take 1 tablet (10 mg total) by mouth every 8 (eight) hours.   isosorbide mononitrate (IMDUR) 30 MG 24 hr tablet Take 1 tablet (30 mg total) by mouth daily.   MEGARED OMEGA-3 KRILL OIL PO Take 1,000 mg by mouth daily. When she remembers   nitroGLYCERIN (NITROSTAT) 0.4 MG SL tablet Place 1 tablet (0.4 mg total) under the tongue every 5 (five) minutes x 3 doses as needed for chest pain.   Polyethyl Glycol-Propyl Glycol (SYSTANE OP) Apply 1 drop to eye 3 (three) times daily as needed (dry eyes).   vitamin E 180 MG (400 UNITS) capsule Take 400 Units by mouth daily.   warfarin (COUMADIN) 5 MG tablet Take 5 mg by mouth every evening.     Allergies:   Patient has no known allergies.   Social History   Tobacco Use   Smoking status: Former    Years: 10.00    Types: Cigarettes    Quit date: 10/14/1978    Years since quitting: 42.8   Smokeless tobacco: Never  Vaping Use    Vaping Use: Never used  Substance Use Topics   Alcohol use: No   Drug use: No     Family Hx: The patient's family history includes Arthritis in an other family member; Cancer in an other family member; Diabetes in an other family member; Heart disease in an other family member; Heart failure in her mother.  ROS:   Please see the history of present illness.     All other systems reviewed and are negative.   Prior CV studies:   The following studies were reviewed today:   Cardiac Catheterization: 01/2021 Previously placed Prox RCA to Dist RCA stent (unknown type) is widely patent. Previously placed Mid LAD stent (unknown type) is widely patent. 1st Diag lesion is 75% stenosed. Lat Ramus lesion is 99% stenosed. Ramus lesion is 50% stenosed. Ost Cx to Prox Cx lesion is 50% stenosed. Mid LM to Dist LM lesion is 30% stenosed. Hemodynamic findings consistent with aortic valve stenosis.  IMPRESSION: Ms. Pardee's LAD and RCA stents are widely patent.  She has moderate disease in a small ramus and nondominant circumflex.  Her pulmonary capillary wedge pressure was moderately elevated.  I believe her symptoms are related to her aortic stenosis which I suspect is more severe than the ultrasound indicates probably from "low output" aortic stenosis.  She may be a candidate for TAVR however she does have moderate renal insufficiency and cognitive impairment.  A right common femoral angiogram was performed and [name] closure devices were successfully deployed in the right common femoral artery and vein achieving hemostasis.  The patient left lab in stable condition.   Echocardiogram: 07/2021 IMPRESSIONS     1. Left ventricular ejection fraction, by estimation, is 45 to 50%. The  left ventricle has mildly decreased function. The left ventricle  demonstrates global hypokinesis. The left ventricular internal cavity size  was mildly dilated. There is mild left  ventricular hypertrophy. Left  ventricular diastolic parameters are  consistent with Grade II diastolic dysfunction (pseudonormalization).   2. Right ventricular systolic function is normal. The right ventricular  size is normal. There is normal pulmonary artery systolic pressure. The  estimated right ventricular systolic pressure is [id number] mmHg.   3. Left atrial size was markedly dilated.  4. There is a trivial pericardial effusion posterior to the left  ventricle.   5. The mitral valve is abnormal. Mild mitral valve regurgitation. The  mean mitral valve gradient is 2.0 mmHg. Moderate mitral annular  calcification.   6. The aortic valve is tricuspid. There is moderate calcification of the  aortic valve. Aortic valve regurgitation is not visualized. Moderate  aortic valve stenosis. Aortic valve area, by VTI measures 1.49 cm. Aortic  valve mean gradient measures 12.5  mmHg. Dimentionless index 0.39.   7. The inferior vena cava is normal in size with greater than 50%  respiratory variability, suggesting right atrial pressure of 3 mmHg.   Comparison(s): Prior images reviewed side by side. LVEF has improved.  Aortic stenosis is moderate range.   Labs/Other Tests and Data Reviewed:    EKG:  No ECG reviewed.  Recent Labs: 01/30/2021: B Natriuretic Peptide 587.4 02/06/2021: ALT 19; BUN 50; Creatinine, Ser 1.90; Hemoglobin 9.2; Magnesium 2.0; Platelets 290; Potassium 4.2; Sodium 142   Recent Lipid Panel Lab Results  Component Value Date/Time   CHOL 177 07/14/2020 12:38 AM   TRIG 78 07/14/2020 02:12 AM   HDL 62 07/14/2020 12:38 AM   CHOLHDL 2.9 07/14/2020 12:38 AM   LDLCALC 91 07/14/2020 12:38 AM    Wt Readings from Last 3 Encounters:  07/31/21 182 lb 3.2 oz (82.6 kg)  05/04/21 193 lb (87.5 kg)  03/08/21 189 lb (85.7 kg)        Objective:    Vital Signs:  BP (!) 165/96   Pulse 98   Ht '5\' 5"'$  (1.651 m)   Wt 182 lb 3.2 oz (82.6 kg)   BMI 30.32 kg/m    No physical exam performed. Phone visit only.    ASSESSMENT & PLAN:    1. CAD - She has undergone multiple interventions in the past dating back to 2006 with most recent catheterization in 01/2021 showing patent stents with no culprit lesion identified for her NSTEMI at that time.  - She has increased her activity level and denies any recent chest pain or dyspnea on exertion.  - Continue current medication regimen with Plavix 75 mg daily, Atorvastatin 40 mg daily, Coreg 6.25 mg twice daily and Imdur 30 mg daily. If a Lipid Panel was not checked with her labs today, would recommend checking on her next visit to make sure her LDL is at goal of less than 70.  2. HFmrEF - Her ejection fraction was at 40 to 45% by echocardiogram in 01/2021 and improved to 45 to 50% by most recent echocardiogram earlier this month. She denies any recent orthopnea, PND or pitting edema. Her weight has overall been well controlled when checked at home and she does take an extra 20 mg of Lasix if needed. At this time, will continue Lasix 40 mg daily alternating with 20 mg daily along with Coreg 6.25 mg twice daily, Imdur 30 mg daily and Hydralazine 10 mg 3 times daily. I did encourage them to follow BP at home and report back with readings. If her blood pressure remains elevated, would increase Hydralazine to 25 mg 3 times daily. As mentioned above, she has not been on an ACE-I/ARB/ARNI/Sprio/SGLT2 inhibitor given her variable renal function.  3. Paroxysmal Atrial Flutter - She is s/p prior ablation in 2014 and has not reported any recent palpitations. She remains on Coreg 6.25 mg twice daily for rate control.   - No reports of active bleeding. She is on Coumadin for anticoagulation given her  known history of atrial flutter and prior PE.    4. Aortic stenosis - Repeat echocardiogram earlier this month showed her aortic stenosis remains in a moderate range. Will continue to follow and anticipate repeat imaging next year.    5. Stage 3-4 CKD - Her creatinine was at 1.77  in 02/2021 and she did have repeat labs earlier this morning for Dr. Theador Hawthorne. Will request a copy of these.     COVID-19 Education: The signs and symptoms of COVID-19 were discussed with the patient and how to seek care for testing (follow up with PCP or arrange E-visit). The importance of social distancing was discussed today.  Time:   Today, I have spent 18 minutes with the patient with telehealth technology discussing the above problems.     Medication Adjustments/Labs and Tests Ordered: Current medicines are reviewed at length with the patient today.  Concerns regarding medicines are outlined above.   Tests Ordered: No orders of the defined types were placed in this encounter.   Medication Changes: No orders of the defined types were placed in this encounter.   Follow Up:  In Person in 6 month(s)  Signed, Erma Heritage, PA-C  07/31/2021 4:36 PM    [facility]

## 2021-07-31 ENCOUNTER — Other Ambulatory Visit: Payer: Self-pay

## 2021-07-31 ENCOUNTER — Ambulatory Visit (INDEPENDENT_AMBULATORY_CARE_PROVIDER_SITE_OTHER): Payer: Medicare Other | Admitting: Student

## 2021-07-31 ENCOUNTER — Encounter: Payer: Self-pay | Admitting: Student

## 2021-07-31 VITALS — BP 165/96 | HR 98 | Ht 65.0 in | Wt 182.2 lb

## 2021-07-31 DIAGNOSIS — M722 Plantar fascial fibromatosis: Secondary | ICD-10-CM | POA: Diagnosis not present

## 2021-07-31 DIAGNOSIS — N1832 Chronic kidney disease, stage 3b: Secondary | ICD-10-CM | POA: Diagnosis not present

## 2021-07-31 DIAGNOSIS — I4892 Unspecified atrial flutter: Secondary | ICD-10-CM | POA: Diagnosis not present

## 2021-07-31 DIAGNOSIS — D631 Anemia in chronic kidney disease: Secondary | ICD-10-CM | POA: Diagnosis not present

## 2021-07-31 DIAGNOSIS — M7662 Achilles tendinitis, left leg: Secondary | ICD-10-CM | POA: Diagnosis not present

## 2021-07-31 DIAGNOSIS — I5042 Chronic combined systolic (congestive) and diastolic (congestive) heart failure: Secondary | ICD-10-CM

## 2021-07-31 DIAGNOSIS — I129 Hypertensive chronic kidney disease with stage 1 through stage 4 chronic kidney disease, or unspecified chronic kidney disease: Secondary | ICD-10-CM | POA: Diagnosis not present

## 2021-07-31 DIAGNOSIS — I2510 Atherosclerotic heart disease of native coronary artery without angina pectoris: Secondary | ICD-10-CM

## 2021-07-31 DIAGNOSIS — E1122 Type 2 diabetes mellitus with diabetic chronic kidney disease: Secondary | ICD-10-CM | POA: Diagnosis not present

## 2021-07-31 DIAGNOSIS — I350 Nonrheumatic aortic (valve) stenosis: Secondary | ICD-10-CM | POA: Diagnosis not present

## 2021-07-31 DIAGNOSIS — N189 Chronic kidney disease, unspecified: Secondary | ICD-10-CM | POA: Diagnosis not present

## 2021-07-31 DIAGNOSIS — M79672 Pain in left foot: Secondary | ICD-10-CM | POA: Diagnosis not present

## 2021-07-31 NOTE — Patient Instructions (Signed)
Medication Instructions:   Your provider would like you to keep a blood pressure log for 1-2 weeks.   Your physician recommends that you continue on your current medications as directed. Please refer to the Current Medication list given to you today.  *If you need a refill on your cardiac medications before your next appointment, please call your pharmacy*   Lab Work: NONE   If you have labs (blood work) drawn today and your tests are completely normal, you will receive your results only by: [place] (if you have MyChart) OR A paper copy in the mail If you have any lab test that is abnormal or we need to change your treatment, we will call you to review the results.   Testing/Procedures: NONE    Follow-Up: At [facility], you and your health needs are our priority.  As part of our continuing mission to provide you with exceptional heart care, we have created designated Provider Care Teams.  These Care Teams include your primary Cardiologist (physician) and Advanced Practice Providers (APPs -  Physician Assistants and Nurse Practitioners) who all work together to provide you with the care you need, when you need it.  We recommend signing up for the patient portal called "MyChart".  Sign up information is provided on this After Visit Summary.  MyChart is used to connect with patients for Virtual Visits (Telemedicine).  Patients are able to view lab/test results, encounter notes, upcoming appointments, etc.  Non-urgent messages can be sent to your provider as well.   To learn more about what you can do with MyChart, go to [email or web address].    Your next appointment:   6 month(s)  The format for your next appointment:   In Person  Provider:   Rozann Lesches, MD or Bernerd Pho, PA-C   Other Instructions Thank you for choosing [facility]!

## 2021-08-10 ENCOUNTER — Other Ambulatory Visit: Payer: Self-pay | Admitting: Nephrology

## 2021-08-10 ENCOUNTER — Other Ambulatory Visit (HOSPITAL_COMMUNITY): Payer: Self-pay | Admitting: Nephrology

## 2021-08-10 DIAGNOSIS — E211 Secondary hyperparathyroidism, not elsewhere classified: Secondary | ICD-10-CM | POA: Diagnosis not present

## 2021-08-10 DIAGNOSIS — E1129 Type 2 diabetes mellitus with other diabetic kidney complication: Secondary | ICD-10-CM | POA: Diagnosis not present

## 2021-08-10 DIAGNOSIS — E1122 Type 2 diabetes mellitus with diabetic chronic kidney disease: Secondary | ICD-10-CM

## 2021-08-10 DIAGNOSIS — D631 Anemia in chronic kidney disease: Secondary | ICD-10-CM | POA: Diagnosis not present

## 2021-08-10 DIAGNOSIS — E790 Hyperuricemia without signs of inflammatory arthritis and tophaceous disease: Secondary | ICD-10-CM | POA: Diagnosis not present

## 2021-08-10 DIAGNOSIS — N189 Chronic kidney disease, unspecified: Secondary | ICD-10-CM | POA: Diagnosis not present

## 2021-08-10 DIAGNOSIS — I129 Hypertensive chronic kidney disease with stage 1 through stage 4 chronic kidney disease, or unspecified chronic kidney disease: Secondary | ICD-10-CM

## 2021-08-10 DIAGNOSIS — I5042 Chronic combined systolic (congestive) and diastolic (congestive) heart failure: Secondary | ICD-10-CM | POA: Diagnosis not present

## 2021-08-10 DIAGNOSIS — R809 Proteinuria, unspecified: Secondary | ICD-10-CM | POA: Diagnosis not present

## 2021-08-22 ENCOUNTER — Encounter (HOSPITAL_COMMUNITY): Payer: Self-pay

## 2021-08-22 ENCOUNTER — Ambulatory Visit (HOSPITAL_COMMUNITY): Payer: Medicare Other

## 2021-09-03 ENCOUNTER — Ambulatory Visit (INDEPENDENT_AMBULATORY_CARE_PROVIDER_SITE_OTHER): Payer: Medicare Other | Admitting: *Deleted

## 2021-09-03 DIAGNOSIS — I4892 Unspecified atrial flutter: Secondary | ICD-10-CM

## 2021-09-03 DIAGNOSIS — Z5181 Encounter for therapeutic drug level monitoring: Secondary | ICD-10-CM | POA: Diagnosis not present

## 2021-09-03 LAB — POCT INR: INR: 1.9 — AB (ref 2.0–3.0)

## 2021-09-03 NOTE — Patient Instructions (Signed)
Description    Take 1.5 tablets of warfarin today and then continue warfarin 1 tablet daily.  Recheck INR in 4 weeks.

## 2021-09-24 DIAGNOSIS — E083293 Diabetes mellitus due to underlying condition with mild nonproliferative diabetic retinopathy without macular edema, bilateral: Secondary | ICD-10-CM | POA: Diagnosis not present

## 2021-09-27 DIAGNOSIS — E785 Hyperlipidemia, unspecified: Secondary | ICD-10-CM | POA: Diagnosis not present

## 2021-09-27 DIAGNOSIS — D509 Iron deficiency anemia, unspecified: Secondary | ICD-10-CM | POA: Diagnosis not present

## 2021-09-27 DIAGNOSIS — E119 Type 2 diabetes mellitus without complications: Secondary | ICD-10-CM | POA: Diagnosis not present

## 2021-09-27 DIAGNOSIS — I10 Essential (primary) hypertension: Secondary | ICD-10-CM | POA: Diagnosis not present

## 2021-09-29 DIAGNOSIS — N184 Chronic kidney disease, stage 4 (severe): Secondary | ICD-10-CM | POA: Diagnosis not present

## 2021-09-29 DIAGNOSIS — D509 Iron deficiency anemia, unspecified: Secondary | ICD-10-CM | POA: Diagnosis not present

## 2021-09-29 DIAGNOSIS — Z6830 Body mass index (BMI) 30.0-30.9, adult: Secondary | ICD-10-CM | POA: Diagnosis not present

## 2021-09-29 DIAGNOSIS — I5033 Acute on chronic diastolic (congestive) heart failure: Secondary | ICD-10-CM | POA: Diagnosis not present

## 2021-09-29 DIAGNOSIS — E782 Mixed hyperlipidemia: Secondary | ICD-10-CM | POA: Diagnosis not present

## 2021-09-29 DIAGNOSIS — I482 Chronic atrial fibrillation, unspecified: Secondary | ICD-10-CM | POA: Diagnosis not present

## 2021-09-29 DIAGNOSIS — E1165 Type 2 diabetes mellitus with hyperglycemia: Secondary | ICD-10-CM | POA: Diagnosis not present

## 2021-09-29 DIAGNOSIS — R809 Proteinuria, unspecified: Secondary | ICD-10-CM | POA: Diagnosis not present

## 2021-09-29 DIAGNOSIS — E6609 Other obesity due to excess calories: Secondary | ICD-10-CM | POA: Diagnosis not present

## 2021-09-29 DIAGNOSIS — I2510 Atherosclerotic heart disease of native coronary artery without angina pectoris: Secondary | ICD-10-CM | POA: Diagnosis not present

## 2021-09-29 DIAGNOSIS — Z86711 Personal history of pulmonary embolism: Secondary | ICD-10-CM | POA: Diagnosis not present

## 2021-09-29 DIAGNOSIS — F0150 Vascular dementia without behavioral disturbance: Secondary | ICD-10-CM | POA: Diagnosis not present

## 2021-10-01 ENCOUNTER — Ambulatory Visit (INDEPENDENT_AMBULATORY_CARE_PROVIDER_SITE_OTHER): Payer: Medicare Other | Admitting: *Deleted

## 2021-10-01 DIAGNOSIS — I4892 Unspecified atrial flutter: Secondary | ICD-10-CM | POA: Diagnosis not present

## 2021-10-01 DIAGNOSIS — Z5181 Encounter for therapeutic drug level monitoring: Secondary | ICD-10-CM | POA: Diagnosis not present

## 2021-10-01 LAB — POCT INR: INR: 2.5 (ref 2.0–3.0)

## 2021-10-01 NOTE — Patient Instructions (Signed)
Continue warfarin 1 tablet daily  Recheck INR in 4 weeks.  

## 2021-10-10 ENCOUNTER — Encounter (HOSPITAL_BASED_OUTPATIENT_CLINIC_OR_DEPARTMENT_OTHER): Payer: Self-pay

## 2021-10-10 ENCOUNTER — Emergency Department (HOSPITAL_BASED_OUTPATIENT_CLINIC_OR_DEPARTMENT_OTHER): Payer: Medicare Other | Admitting: Radiology

## 2021-10-10 ENCOUNTER — Other Ambulatory Visit: Payer: Self-pay

## 2021-10-10 DIAGNOSIS — Z20822 Contact with and (suspected) exposure to covid-19: Secondary | ICD-10-CM | POA: Insufficient documentation

## 2021-10-10 DIAGNOSIS — Z7902 Long term (current) use of antithrombotics/antiplatelets: Secondary | ICD-10-CM | POA: Diagnosis not present

## 2021-10-10 DIAGNOSIS — N183 Chronic kidney disease, stage 3 unspecified: Secondary | ICD-10-CM | POA: Insufficient documentation

## 2021-10-10 DIAGNOSIS — I5042 Chronic combined systolic (congestive) and diastolic (congestive) heart failure: Secondary | ICD-10-CM | POA: Diagnosis not present

## 2021-10-10 DIAGNOSIS — Z955 Presence of coronary angioplasty implant and graft: Secondary | ICD-10-CM | POA: Insufficient documentation

## 2021-10-10 DIAGNOSIS — E1122 Type 2 diabetes mellitus with diabetic chronic kidney disease: Secondary | ICD-10-CM | POA: Diagnosis not present

## 2021-10-10 DIAGNOSIS — J9801 Acute bronchospasm: Secondary | ICD-10-CM | POA: Diagnosis not present

## 2021-10-10 DIAGNOSIS — Z87891 Personal history of nicotine dependence: Secondary | ICD-10-CM | POA: Diagnosis not present

## 2021-10-10 DIAGNOSIS — Z7984 Long term (current) use of oral hypoglycemic drugs: Secondary | ICD-10-CM | POA: Insufficient documentation

## 2021-10-10 DIAGNOSIS — I130 Hypertensive heart and chronic kidney disease with heart failure and stage 1 through stage 4 chronic kidney disease, or unspecified chronic kidney disease: Secondary | ICD-10-CM | POA: Insufficient documentation

## 2021-10-10 DIAGNOSIS — F0390 Unspecified dementia without behavioral disturbance: Secondary | ICD-10-CM | POA: Insufficient documentation

## 2021-10-10 DIAGNOSIS — Z7901 Long term (current) use of anticoagulants: Secondary | ICD-10-CM | POA: Diagnosis not present

## 2021-10-10 DIAGNOSIS — B349 Viral infection, unspecified: Secondary | ICD-10-CM | POA: Insufficient documentation

## 2021-10-10 DIAGNOSIS — R059 Cough, unspecified: Secondary | ICD-10-CM | POA: Diagnosis present

## 2021-10-10 DIAGNOSIS — R0602 Shortness of breath: Secondary | ICD-10-CM | POA: Diagnosis not present

## 2021-10-10 DIAGNOSIS — B9789 Other viral agents as the cause of diseases classified elsewhere: Secondary | ICD-10-CM | POA: Diagnosis not present

## 2021-10-10 LAB — CBC
HCT: 34.4 % — ABNORMAL LOW (ref 36.0–46.0)
Hemoglobin: 11.1 g/dL — ABNORMAL LOW (ref 12.0–15.0)
MCH: 29.4 pg (ref 26.0–34.0)
MCHC: 32.3 g/dL (ref 30.0–36.0)
MCV: 91.2 fL (ref 80.0–100.0)
Platelets: 295 10*3/uL (ref 150–400)
RBC: 3.77 MIL/uL — ABNORMAL LOW (ref 3.87–5.11)
RDW: 13.7 % (ref 11.5–15.5)
WBC: 7.5 10*3/uL (ref 4.0–10.5)
nRBC: 0 % (ref 0.0–0.2)

## 2021-10-10 LAB — COMPREHENSIVE METABOLIC PANEL
ALT: 19 U/L (ref 0–44)
AST: 17 U/L (ref 15–41)
Albumin: 3.8 g/dL (ref 3.5–5.0)
Alkaline Phosphatase: 67 U/L (ref 38–126)
Anion gap: 9 (ref 5–15)
BUN: 50 mg/dL — ABNORMAL HIGH (ref 8–23)
CO2: 26 mmol/L (ref 22–32)
Calcium: 8.5 mg/dL — ABNORMAL LOW (ref 8.9–10.3)
Chloride: 104 mmol/L (ref 98–111)
Creatinine, Ser: 2.1 mg/dL — ABNORMAL HIGH (ref 0.44–1.00)
GFR, Estimated: 23 mL/min — ABNORMAL LOW (ref >60)
Glucose, Bld: 261 mg/dL — ABNORMAL HIGH (ref 70–99)
Potassium: 4.5 mmol/L (ref 3.5–5.1)
Sodium: 139 mmol/L (ref 135–145)
Total Bilirubin: 0.8 mg/dL (ref 0.3–1.2)
Total Protein: 6.5 g/dL (ref 6.5–8.1)

## 2021-10-10 LAB — RESP PANEL BY RT-PCR (FLU A&B, COVID) ARPGX2
Influenza A by PCR: NEGATIVE
Influenza B by PCR: NEGATIVE
SARS Coronavirus 2 by RT PCR: NEGATIVE

## 2021-10-10 NOTE — ED Triage Notes (Signed)
Pt presents to the ED with cough, wheezing, SHOB, nasal congestion, and fatigue. Pt has a hx of CHF.

## 2021-10-11 ENCOUNTER — Encounter (HOSPITAL_COMMUNITY): Payer: Self-pay | Admitting: Emergency Medicine

## 2021-10-11 ENCOUNTER — Other Ambulatory Visit: Payer: Self-pay

## 2021-10-11 ENCOUNTER — Emergency Department (HOSPITAL_BASED_OUTPATIENT_CLINIC_OR_DEPARTMENT_OTHER)
Admission: EM | Admit: 2021-10-11 | Discharge: 2021-10-11 | Discharge status: Against Medical Advice | Payer: Medicare Other | Attending: Emergency Medicine | Admitting: Emergency Medicine

## 2021-10-11 ENCOUNTER — Emergency Department (HOSPITAL_COMMUNITY)
Admission: EM | Admit: 2021-10-11 | Discharge: 2021-10-11 | Disposition: A | Discharge status: Home / Self Care | Payer: Medicare Other | Source: Home / Self Care | Attending: Emergency Medicine | Admitting: Emergency Medicine

## 2021-10-11 DIAGNOSIS — I2510 Atherosclerotic heart disease of native coronary artery without angina pectoris: Secondary | ICD-10-CM | POA: Insufficient documentation

## 2021-10-11 DIAGNOSIS — N183 Chronic kidney disease, stage 3 unspecified: Secondary | ICD-10-CM | POA: Insufficient documentation

## 2021-10-11 DIAGNOSIS — E1122 Type 2 diabetes mellitus with diabetic chronic kidney disease: Secondary | ICD-10-CM | POA: Insufficient documentation

## 2021-10-11 DIAGNOSIS — I130 Hypertensive heart and chronic kidney disease with heart failure and stage 1 through stage 4 chronic kidney disease, or unspecified chronic kidney disease: Secondary | ICD-10-CM | POA: Insufficient documentation

## 2021-10-11 DIAGNOSIS — B9789 Other viral agents as the cause of diseases classified elsewhere: Secondary | ICD-10-CM | POA: Diagnosis not present

## 2021-10-11 DIAGNOSIS — F0150 Vascular dementia without behavioral disturbance: Secondary | ICD-10-CM | POA: Insufficient documentation

## 2021-10-11 DIAGNOSIS — B348 Other viral infections of unspecified site: Secondary | ICD-10-CM | POA: Insufficient documentation

## 2021-10-11 DIAGNOSIS — J9801 Acute bronchospasm: Secondary | ICD-10-CM | POA: Insufficient documentation

## 2021-10-11 DIAGNOSIS — I161 Hypertensive emergency: Secondary | ICD-10-CM | POA: Insufficient documentation

## 2021-10-11 DIAGNOSIS — I5043 Acute on chronic combined systolic (congestive) and diastolic (congestive) heart failure: Secondary | ICD-10-CM | POA: Insufficient documentation

## 2021-10-11 DIAGNOSIS — B349 Viral infection, unspecified: Secondary | ICD-10-CM | POA: Diagnosis not present

## 2021-10-11 DIAGNOSIS — Z87891 Personal history of nicotine dependence: Secondary | ICD-10-CM | POA: Insufficient documentation

## 2021-10-11 DIAGNOSIS — Z7984 Long term (current) use of oral hypoglycemic drugs: Secondary | ICD-10-CM | POA: Insufficient documentation

## 2021-10-11 DIAGNOSIS — Z7901 Long term (current) use of anticoagulants: Secondary | ICD-10-CM | POA: Insufficient documentation

## 2021-10-11 HISTORY — DX: Heart failure, unspecified: I50.9

## 2021-10-11 MED ORDER — ALBUTEROL SULFATE HFA 108 (90 BASE) MCG/ACT IN AERS
2.0000 | INHALATION_SPRAY | Freq: Once | RESPIRATORY_TRACT | Status: AC
  Administered 2021-10-11: 05:00:00 2 via RESPIRATORY_TRACT
  Filled 2021-10-11: qty 6.7

## 2021-10-11 MED ORDER — ALBUTEROL SULFATE (2.5 MG/3ML) 0.083% IN NEBU
5.0000 mg | INHALATION_SOLUTION | Freq: Once | RESPIRATORY_TRACT | Status: AC
  Administered 2021-10-11: 04:00:00 5 mg via RESPIRATORY_TRACT
  Filled 2021-10-11: qty 6

## 2021-10-11 MED ORDER — AEROCHAMBER PLUS FLO-VU MISC
1.0000 | Freq: Once | Status: AC
  Administered 2021-10-11: 05:00:00 1
  Filled 2021-10-11: qty 1

## 2021-10-11 MED ORDER — IPRATROPIUM-ALBUTEROL 0.5-2.5 (3) MG/3ML IN SOLN
3.0000 mL | Freq: Once | RESPIRATORY_TRACT | Status: AC
  Administered 2021-10-11: 02:00:00 3 mL via RESPIRATORY_TRACT
  Filled 2021-10-11: qty 3

## 2021-10-11 MED ORDER — PREDNISONE 50 MG PO TABS
60.0000 mg | ORAL_TABLET | Freq: Once | ORAL | Status: AC
  Administered 2021-10-11: 04:00:00 60 mg via ORAL
  Filled 2021-10-11: qty 1

## 2021-10-11 MED ORDER — PREDNISONE 50 MG PO TABS: ORAL_TABLET | ORAL | 0 refills | Status: DC

## 2021-10-11 NOTE — ED Triage Notes (Signed)
Pt just left [facility] without being seen. She had testing done there.

## 2021-10-11 NOTE — ED Provider Notes (Signed)
[facility] EMERGENCY DEPARTMENT Provider Note   CSN: [id number] Arrival date & time: 10/11/21  0137     History Chief Complaint  Patient presents with   Cough    [name] is a 80 y.o. female.  The history is provided by the patient and a relative.  Cough Severity:  Moderate Onset quality:  Gradual Duration:  2 days Timing:  Intermittent Progression:  Worsening Chronicity:  New Smoker: no   Relieved by:  Nothing Worsened by:  Nothing Associated symptoms: shortness of breath, sinus congestion and wheezing   Associated symptoms: no chest pain and no fever   Patient with extensive history including atrial flutter, PE, chronic kidney disease presents with cough and congestion.  Patient reports for the past 2 days she has had cough, congestion and some wheezing.  There is mild shortness of breath.  No chest pain.  No extremity edema. No vomiting or diarrhea is reported No previous history of chronic lung disease    Past Medical History:  Diagnosis Date   Arthritis    Atrial flutter ([place])    a. 06/2013 s/p RFCA.   Bilateral pulmonary embolism ([place]) 08/2008   a. Chronic coumadin.   Carotid arterial disease ([place])    a. 03/2017 Carotid angio: RICA 32-67%, LICA 12-45%.   CHF (congestive heart failure) (HCC)    CKD (chronic kidney disease), stage III (HCC)    Coronary artery disease    a. 05/2005 Inf STEMI/PCI: severe LAD/LCX/RCA dzs->CABG advised, pt preferred PCI-->Cypher DES to mRCA & mLAD; b. 02/2008 PCI: ISR of RCA->Cypher DES. LAD patent; c. 07/2020 Inf STEMI/PCI: LM 30d, LAD 47m, D1 80(small), RI 75, LCX 80ost->distal (small), RCA 25p/m, 151m/d (3.0x26 Resolute Onyx DES).   Essential hypertension    HFrEF (heart failure with reduced ejection fraction) ([place])    a. 07/2020 Echo: EF 45-50% in setting of inf STEMI; b. 01/2021 Echo: EF 40-45%, glob HK, sev basal-septal LVH w/ grII DD. Nl RV fxn, RVSP 37.75mmHg, sev dil LA, mild MR, mod AS.   Hyperlipidemia    Ischemic  cardiomyopathy    a. 07/2020 Echo: EF 45-50%; b. 01/2021 Echo: EF 40-45%.   Memory disorder 07/08/2017   Moderate aortic stenosis    a. 01/2021 Echo: Mild-mod AS w/ mean grad 57mmHg. AoV area 2.39m/s (Vmax). In setting of LV dysfxn, AS likely more moderate.   Myocardial infarction [facility])    Stroke ([employer])    Type 2 diabetes mellitus ([place])    Vascular dementia [facility])     Patient Active Problem List   Diagnosis Date Noted   Hypertensive emergency    Acute on chronic congestive heart failure ([place]) 80/99/8338   Systolic and diastolic CHF, chronic ([place]) 11/28/2020   Acute respiratory failure due to COVID-19 [facility]) 11/10/2020   Hyperlipidemia    Acute ST elevation myocardial infarction (STEMI) involving right coronary artery ([place])    Acute heart failure ([place]) 07/14/2020   Acute coronary syndrome (HCC)    Acute pulmonary edema (HCC)    Respiratory failure (HCC)    NSTEMI (non-ST elevated myocardial infarction) ([place])    S/P left knee arthroscopy 09/25/17 10/02/2017   Derangement of posterior horn of medial meniscus of left knee    Derangement of posterior horn of lateral meniscus of left knee    Primary osteoarthritis of left knee    Memory disorder 07/08/2017   Carotid artery stenosis 04/08/2017   Left arm numbness 03/25/2017   Chest pain 03/25/2017   DM type 2 (diabetes  mellitus, type 2) ([place]) 03/25/2017   CKD (chronic kidney disease), stage III ([place]) 03/25/2017   Long term (current) use of anticoagulants [Z79.01] 04/22/2016   Facial droop    Encounter for therapeutic drug monitoring 12/01/2013   Atrial flutter ([place]) 06/09/2013   Essential hypertension 06/09/2013   CAD (coronary artery disease) 06/09/2013   CLOSED FRACTURE OF SURGICAL NECK OF HUMERUS 04/12/2010   HIP, ARTHRITIS, DEGEN./OSTEO 03/01/2009   HIP PAIN 03/01/2009    Past Surgical History:  Procedure Laterality Date   ATRIAL FLUTTER ABLATION N/A 06/17/2013   Procedure: ATRIAL FLUTTER ABLATION;  Surgeon: Thompson Grayer, MD;   Location: [facility] CATH LAB;  Service: Cardiovascular;  Laterality: N/A;   CAROTID PTA/STENT INTERVENTION N/A 04/10/2017   Procedure: Carotid PTA/Stent Intervention;  Surgeon: Algernon Huxley, MD;  Location: [place] CV LAB;  Service: Cardiovascular;  Laterality: N/A;   CHOLECYSTECTOMY     CORONARY STENT INTERVENTION N/A 07/14/2020   Procedure: CORONARY STENT INTERVENTION;  Surgeon: Sherren Mocha, MD;  Location: [place] CV LAB;  Service: Cardiovascular;  Laterality: N/A;   HIP SURGERY     KNEE ARTHROSCOPY WITH MEDIAL MENISECTOMY Left 09/25/2017   Procedure: KNEE ARTHROSCOPY WITH MEDIAL AND LATERAL MENISECTOMY;  Surgeon: [name], MD;  Location: [facility] ORS;  Service: Orthopedics;  Laterality: Left;   LEFT HEART CATH AND CORONARY ANGIOGRAPHY N/A 07/14/2020   Procedure: LEFT HEART CATH AND CORONARY ANGIOGRAPHY;  Surgeon: Sherren Mocha, MD;  Location: [place] CV LAB;  Service: Cardiovascular;  Laterality: N/A;   RIGHT/LEFT HEART CATH AND CORONARY ANGIOGRAPHY N/A 02/05/2021   Procedure: RIGHT/LEFT HEART CATH AND CORONARY ANGIOGRAPHY;  Surgeon: Lorretta Harp, MD;  Location: [place] CV LAB;  Service: Cardiovascular;  Laterality: N/A;     OB History   No obstetric history on file.     Family History  Problem Relation Age of Onset   Heart disease Other    Arthritis Other    Cancer Other    Diabetes Other    Heart failure Mother     Social History   Tobacco Use   Smoking status: Former    Years: 10.00    Types: Cigarettes    Quit date: 10/14/1978    Years since quitting: 43.0   Smokeless tobacco: Never  Vaping Use   Vaping Use: Never used  Substance Use Topics   Alcohol use: No   Drug use: No    Home Medications Prior to Admission medications   Medication Sig Start Date End Date Taking? Authorizing Provider  albuterol (VENTOLIN HFA) 108 (90 Base) MCG/ACT inhaler Inhale 2 puffs into the lungs every 6 (six) hours as needed for wheezing or shortness of  breath. Patient not taking: Reported on 07/31/2021 11/11/20   Heath Lark D, DO  ALPRAZolam Duanne Moron) 0.25 MG tablet Take 0.125-0.25 mg by mouth See admin instructions. 0.125 in the morning. 0.25 mg at bedtime 06/29/20   [provider]  Ascorbic Acid (VITAMIN C GUMMIES PO) Take 2 each by mouth daily. 180 mg    [provider]  atorvastatin (LIPITOR) 40 MG tablet Take 1 tablet (40 mg total) by mouth daily. 02/07/21 07/31/21  Elodia Florence., MD  b complex vitamins capsule Take 1 capsule by mouth daily.    [provider]  carvedilol (COREG) 6.25 MG tablet Take 1 tablet (6.25 mg total) by mouth 2 (two) times daily. 02/13/21   Satira Sark, MD  cholecalciferol (VITAMIN D3) 25 MCG (1000 UNIT) tablet Take 1,000  Units by mouth daily.    [provider]  clopidogrel (PLAVIX) 75 MG tablet Take 1 tablet by mouth once daily with breakfast 01/11/21   Kathyrn Drown D, NP  Coenzyme Q10 (CO Q 10) 100 MG CAPS Take 100 mg by mouth daily.    [provider]  furosemide (LASIX) 20 MG tablet Take 1-2 tablets (20-40 mg total) by mouth every other day. Patient taking differently: Take 20-40 mg by mouth every other day. Takes 40 mg  on Tues, Thur. 02/13/21   Satira Sark, MD  glipiZIDE (GLUCOTROL XL) 5 MG 24 hr tablet Take 5 mg by mouth in the morning and at bedtime. 09/12/20   [provider]  hydrALAZINE (APRESOLINE) 10 MG tablet Take 1 tablet (10 mg total) by mouth every 8 (eight) hours. 03/09/21 07/31/21  Strader, Fransisco Hertz, PA-C  isosorbide mononitrate (IMDUR) 30 MG 24 hr tablet Take 1 tablet (30 mg total) by mouth daily. 03/08/21 03/03/22  Strader, Fransisco Hertz, PA-C  MEGARED OMEGA-3 KRILL OIL PO Take 1,000 mg by mouth daily. When she remembers    [provider]  nitroGLYCERIN (NITROSTAT) 0.4 MG SL tablet Place 1 tablet (0.4 mg total) under the tongue every 5 (five) minutes x 3 doses as needed for chest pain. 08/03/20   Verta Ellen., NP   Polyethyl Glycol-Propyl Glycol (SYSTANE OP) Apply 1 drop to eye 3 (three) times daily as needed (dry eyes).    [provider]  vitamin E 180 MG (400 UNITS) capsule Take 400 Units by mouth daily.    [provider]  warfarin (COUMADIN) 5 MG tablet Take 5 mg by mouth every evening. 05/31/18   [provider]    Allergies    Patient has no known allergies.  Review of Systems   Review of Systems  Constitutional:  Negative for fever.  Respiratory:  Positive for cough, shortness of breath and wheezing.   Cardiovascular:  Negative for chest pain.  Gastrointestinal:  Negative for diarrhea and vomiting.  All other systems reviewed and are negative.  Physical Exam Updated Vital Signs BP (!) 106/56    Pulse 95    Temp 98 F (36.7 C)    Resp 16    Ht 1.651 m (5\' 5" )    Wt 82.1 kg    SpO2 97%    BMI 30.12 kg/m   Physical Exam CONSTITUTIONAL: Elderly, no acute distress HEAD: Normocephalic/atraumatic EYES: EOMI/PERRL ENMT: No stridor or dysphonia NECK: supple no meningeal signs SPINE/BACK:entire spine nontender CV: No loud harsh murmur LUNGS: Scattered wheeze bilaterally, mild tachypnea ABDOMEN: soft, nontender, no rebound or guarding, bowel sounds noted throughout abdomen GU:no cva tenderness NEURO: Pt is awake/alert/appropriate, moves all extremitiesx4.  No facial droop.   EXTREMITIES: pulses normal/equal, full ROM, no lower extremity edema SKIN: warm, color normal PSYCH: no abnormalities of mood noted, alert and oriented to situation  ED Results / Procedures / Treatments   Labs (all labs ordered are listed, but only abnormal results are displayed) Labs Reviewed - No data to display  EKG None  Radiology DG Chest 2 View  Result Date: 10/10/2021 CLINICAL DATA:  Shortness of breath EXAM: CHEST - 2 VIEW COMPARISON:  02/01/2021 FINDINGS: The heart size and mediastinal contours are within normal limits. Both lungs are clear. The visualized skeletal  structures are unremarkable. IMPRESSION: No active cardiopulmonary disease. Electronically Signed   By: Ulyses Jarred M.D.   On: 10/10/2021 22:55    Procedures Procedures   Medications  Ordered in ED Medications  albuterol (VENTOLIN HFA) 108 (90 Base) MCG/ACT inhaler 2 puff (has no administration in time range)  aerochamber plus with mask device 1 each (has no administration in time range)  ipratropium-albuterol (DUONEB) 0.5-2.5 (3) MG/3ML nebulizer solution 3 mL (3 mLs Nebulization Given 10/11/21 0214)  predniSONE (DELTASONE) tablet 60 mg (60 mg Oral Given 10/11/21 0349)  albuterol (PROVENTIL) (2.5 MG/3ML) 0.083% nebulizer solution 5 mg (5 mg Nebulization Given 10/11/21 0353)    ED Course  I have reviewed the triage vital signs and the nursing notes.  Pertinent labs & imaging results that were available during my care of the patient were reviewed by me and considered in my medical decision making (see chart for details).    MDM Rules/Calculators/A&P                          This patient presents to the ED for concern of cough, this involves an extensive number of treatment options, and is a complaint that carries with it a high risk of complications and morbidity.  The differential diagnosis includes pneumonia, CHF, COVID-19, bronchospasm   Additional history obtained:  Additional history obtained from daughter    Lab Tests:  I Ordered, reviewed, and interpreted labs.  The pertinent results include: No acute finding   Imaging Studies ordered:  I ordered imaging studies including chest x-ray I independently visualized and interpreted imaging which showed no acute finding I agree with the radiologist interpretation     Medicines ordered and prescription drug management:  I ordered medication including albuterol and prednisone for wheezing Reevaluation of the patient after these medicines showed that the patient improved I have reviewed the patients home medicines and  have made adjustments as needed      Reevaluation:  After the interventions noted above, I reevaluated the patient and found that they have :improved   Dispostion:  After consideration of the diagnostic results and the patients response to treatment feel that the patent would benefit from discharge home and outpatient managed.   5:04 AM Patient reports recent cough and congestion.  Patient has scattered wheezing on exam but is overall in no acute distress.  Imaging and labs from recent ED visit were reviewed.  She is negative for COVID-19.  Chest x-ray was negative. Patient likely has viral URI triggering bronchospasm She is safe for treatment at home.  No hypoxia, no tachypnea, patient appears comfortable She has no signs of CHF. Discussed appropriate use of medications with patient and daughter, and we discussed strict return precaution They will monitor her glucose at home while taking prednisone.  She will be discharged home with an albuterol mdi  Final Clinical Impression(s) / ED Diagnoses Final diagnoses:  Acute bronchospasm due to viral infection    Rx / DC Orders ED Discharge Orders          Ordered    predniSONE (DELTASONE) 50 MG tablet        10/11/21 0458             Ripley Fraise, MD 10/11/21 0505

## 2021-10-11 NOTE — ED Triage Notes (Signed)
Pt c/o cough and wheezing x 2 days.

## 2021-10-17 DIAGNOSIS — R062 Wheezing: Secondary | ICD-10-CM | POA: Diagnosis not present

## 2021-10-17 DIAGNOSIS — J40 Bronchitis, not specified as acute or chronic: Secondary | ICD-10-CM | POA: Diagnosis not present

## 2021-10-24 ENCOUNTER — Other Ambulatory Visit: Payer: Self-pay | Admitting: Cardiology

## 2021-10-24 NOTE — Telephone Encounter (Signed)
This is a [facility] pt.  °

## 2021-10-29 ENCOUNTER — Other Ambulatory Visit: Payer: Self-pay

## 2021-10-29 ENCOUNTER — Ambulatory Visit (INDEPENDENT_AMBULATORY_CARE_PROVIDER_SITE_OTHER): Payer: Medicare Other | Admitting: *Deleted

## 2021-10-29 DIAGNOSIS — Z5181 Encounter for therapeutic drug level monitoring: Secondary | ICD-10-CM | POA: Diagnosis not present

## 2021-10-29 DIAGNOSIS — I4892 Unspecified atrial flutter: Secondary | ICD-10-CM

## 2021-10-29 LAB — POCT INR: INR: 2.3 (ref 2.0–3.0)

## 2021-10-29 NOTE — Patient Instructions (Signed)
Continue warfarin 1 tablet daily  Recheck INR in 6 weeks.  

## 2021-11-01 ENCOUNTER — Other Ambulatory Visit: Payer: Self-pay | Admitting: Cardiology

## 2021-11-13 DIAGNOSIS — I10 Essential (primary) hypertension: Secondary | ICD-10-CM | POA: Diagnosis not present

## 2021-11-13 DIAGNOSIS — E782 Mixed hyperlipidemia: Secondary | ICD-10-CM | POA: Diagnosis not present

## 2021-11-20 ENCOUNTER — Other Ambulatory Visit: Payer: Self-pay | Admitting: Student

## 2021-11-20 MED ORDER — FUROSEMIDE 20 MG PO TABS: 20.0000 mg | ORAL_TABLET | Freq: Two times a day (BID) | ORAL | 2 refills | Status: DC

## 2021-12-14 ENCOUNTER — Other Ambulatory Visit: Payer: Self-pay | Admitting: Cardiology

## 2021-12-24 DIAGNOSIS — J441 Chronic obstructive pulmonary disease with (acute) exacerbation: Secondary | ICD-10-CM | POA: Diagnosis not present

## 2021-12-24 DIAGNOSIS — R062 Wheezing: Secondary | ICD-10-CM | POA: Diagnosis not present

## 2021-12-24 DIAGNOSIS — R059 Cough, unspecified: Secondary | ICD-10-CM | POA: Diagnosis not present

## 2021-12-31 DIAGNOSIS — I10 Essential (primary) hypertension: Secondary | ICD-10-CM | POA: Diagnosis not present

## 2021-12-31 DIAGNOSIS — R062 Wheezing: Secondary | ICD-10-CM | POA: Diagnosis not present

## 2021-12-31 DIAGNOSIS — D509 Iron deficiency anemia, unspecified: Secondary | ICD-10-CM | POA: Diagnosis not present

## 2021-12-31 DIAGNOSIS — E119 Type 2 diabetes mellitus without complications: Secondary | ICD-10-CM | POA: Diagnosis not present

## 2021-12-31 DIAGNOSIS — J441 Chronic obstructive pulmonary disease with (acute) exacerbation: Secondary | ICD-10-CM | POA: Diagnosis not present

## 2021-12-31 DIAGNOSIS — J309 Allergic rhinitis, unspecified: Secondary | ICD-10-CM | POA: Diagnosis not present

## 2021-12-31 DIAGNOSIS — R059 Cough, unspecified: Secondary | ICD-10-CM | POA: Diagnosis not present

## 2022-01-02 ENCOUNTER — Telehealth: Payer: Self-pay | Admitting: Cardiology

## 2022-01-02 DIAGNOSIS — R809 Proteinuria, unspecified: Secondary | ICD-10-CM | POA: Diagnosis not present

## 2022-01-02 DIAGNOSIS — N184 Chronic kidney disease, stage 4 (severe): Secondary | ICD-10-CM | POA: Diagnosis not present

## 2022-01-02 DIAGNOSIS — Z6830 Body mass index (BMI) 30.0-30.9, adult: Secondary | ICD-10-CM | POA: Diagnosis not present

## 2022-01-02 DIAGNOSIS — Z7901 Long term (current) use of anticoagulants: Secondary | ICD-10-CM | POA: Diagnosis not present

## 2022-01-02 DIAGNOSIS — R059 Cough, unspecified: Secondary | ICD-10-CM | POA: Diagnosis not present

## 2022-01-02 DIAGNOSIS — I2510 Atherosclerotic heart disease of native coronary artery without angina pectoris: Secondary | ICD-10-CM | POA: Diagnosis not present

## 2022-01-02 DIAGNOSIS — D509 Iron deficiency anemia, unspecified: Secondary | ICD-10-CM | POA: Diagnosis not present

## 2022-01-02 DIAGNOSIS — J441 Chronic obstructive pulmonary disease with (acute) exacerbation: Secondary | ICD-10-CM | POA: Diagnosis not present

## 2022-01-02 DIAGNOSIS — E1165 Type 2 diabetes mellitus with hyperglycemia: Secondary | ICD-10-CM | POA: Diagnosis not present

## 2022-01-02 DIAGNOSIS — I5033 Acute on chronic diastolic (congestive) heart failure: Secondary | ICD-10-CM | POA: Diagnosis not present

## 2022-01-02 DIAGNOSIS — I482 Chronic atrial fibrillation, unspecified: Secondary | ICD-10-CM | POA: Diagnosis not present

## 2022-01-02 DIAGNOSIS — E782 Mixed hyperlipidemia: Secondary | ICD-10-CM | POA: Diagnosis not present

## 2022-01-02 NOTE — Telephone Encounter (Signed)
Daughter is asking that you give her a call. Please advise ?

## 2022-01-02 NOTE — Telephone Encounter (Signed)
Returned a call to the pt's dtr, [name], on a DPR, she started speaking and then the phone hung up.  ?Called [name] back and she stated she took the pt to her PCP office today to follow after her illness and they checked her INR and it was 1.9 and they gave her instructions. She stated they gave her instructions to take 1.5 tablets of warfarin today them resume taking her normal 1 tablet daily and they are planning to recheck her INR next week. She states she does plan to follow up with our clinic but while the pt has this acute illness she prefers to keep her in one place. Advised that I would update [name] and let her know. Also, daughter is aware to call back if needed. She stated she would call back next week to keep [place] updated.  ?

## 2022-01-10 DIAGNOSIS — J441 Chronic obstructive pulmonary disease with (acute) exacerbation: Secondary | ICD-10-CM | POA: Diagnosis not present

## 2022-01-10 DIAGNOSIS — E1165 Type 2 diabetes mellitus with hyperglycemia: Secondary | ICD-10-CM | POA: Diagnosis not present

## 2022-01-10 DIAGNOSIS — E782 Mixed hyperlipidemia: Secondary | ICD-10-CM | POA: Diagnosis not present

## 2022-01-10 DIAGNOSIS — I482 Chronic atrial fibrillation, unspecified: Secondary | ICD-10-CM | POA: Diagnosis not present

## 2022-01-10 LAB — POCT INR: INR: 1.7 — AB (ref 2.0–3.0)

## 2022-01-15 ENCOUNTER — Telehealth: Payer: Self-pay | Admitting: Cardiology

## 2022-01-15 ENCOUNTER — Ambulatory Visit (INDEPENDENT_AMBULATORY_CARE_PROVIDER_SITE_OTHER): Payer: Medicare Other | Admitting: *Deleted

## 2022-01-15 DIAGNOSIS — I4892 Unspecified atrial flutter: Secondary | ICD-10-CM | POA: Diagnosis not present

## 2022-01-15 DIAGNOSIS — Z5181 Encounter for therapeutic drug level monitoring: Secondary | ICD-10-CM

## 2022-01-15 NOTE — Patient Instructions (Signed)
INR was checked last Thursday at Dr Juel Burrow office. ?They called daughter this morning.  Pt is to take 1 tablet daily except 2 tablets tonight and Friday night and recheck next week.  She will recheck at at Dr Juel Burrow office next week and return here on 4 18/23 ?Daughter Pam to call with INR results from next week. ?

## 2022-01-15 NOTE — Telephone Encounter (Signed)
Patient's daughter requesting Lattie Haw call her back for the INR readings. Phone: [phone] ? ?

## 2022-01-15 NOTE — Telephone Encounter (Signed)
Called and spoke with daughter [name].  See coumadin note from today. ?

## 2022-01-29 ENCOUNTER — Ambulatory Visit (INDEPENDENT_AMBULATORY_CARE_PROVIDER_SITE_OTHER): Payer: Medicare Other | Admitting: *Deleted

## 2022-01-29 DIAGNOSIS — I4892 Unspecified atrial flutter: Secondary | ICD-10-CM

## 2022-01-29 DIAGNOSIS — Z5181 Encounter for therapeutic drug level monitoring: Secondary | ICD-10-CM

## 2022-01-29 LAB — POCT INR: INR: 2.7 (ref 2.0–3.0)

## 2022-01-29 NOTE — Patient Instructions (Signed)
Continue warfarin 1 tablet daily ?Recheck in 3 wks ?

## 2022-01-29 NOTE — Progress Notes (Signed)
? ?Cardiology Office Note   ? ?Date:  01/30/2022  ? ?ID:  [name], DOB May 09, 1941, MRN [id number] ? ?PCP:  Celene Squibb, MD  ?Cardiologist: Rozann Lesches, MD   ? ?Chief Complaint  ?Patient presents with  ? Follow-up  ?  6 month visit  ? ? ?History of Present Illness:   ? ?[name] is a 81 y.o. female with past medical history of CAD (s/p DES to mid-RCA and DES to mid-LAD in 2006, DES to ISR of RCA in 2009, inferior STEMI in 07/2020 with ISR of RCA and treated with DES, s/p NSTEMI in 01/2021 with cath showing patent stents with no culprit lesions), HFrEF/ICM (EF 40-45% by echo in 01/2021, EF at 45-50% in 07/2021), atrial flutter (s/p ablation in 2014), history of PE (on Coumadin), HTN, HLD, Type 2 DM, Stage 3 CKD, prior CVA and aortic stenosis who presents to the office today for 74-month follow-up. ? ?She most recently had a telehealth visit with myself in 07/2021 and reported overall doing well at that time and denied any recent anginal symptoms. She was going to the [facility] at least 2 days a week. She was continued on her current cardiac medication regimen including Lasix 40 mg daily alternating with 20 mg daily. ? ?In talking with the patient and her daughter today, she reports overall feeling well from a cardiac perspective since her last office visit. She reports her breathing has been stable and denies any recent orthopnea, PND or pitting edema. She does report having 2 recent respiratory infections within the past several months and was treated with antibiotics and steroids. She does take her Lasix as 20 mg twice daily as she had more weakness when taking this as 40 mg once daily. She denies any recent chest pain or palpitations. Has overall been unaware of her arrhythmia and is unsure when this recurred (was in normal sinus rhythm at the time of echocardiogram in 07/2021 and her daughter thinks her heart rate might have been elevated in 09/2021 but no recent issues). ? ?Past Medical  History:  ?Diagnosis Date  ? Arthritis   ? Atrial flutter ([place])   ? a. 06/2013 s/p RFCA.  ? Bilateral pulmonary embolism ([place]) 08/2008  ? a. Chronic coumadin.  ? Carotid arterial disease ([place])   ? a. 03/2017 Carotid angio: RICA 53-66%, LICA 44-03%.  ? CHF (congestive heart failure) ([place])   ? CKD (chronic kidney disease), stage III ([place])   ? Coronary artery disease   ? a. 05/2005 Inf STEMI/PCI: severe LAD/LCX/RCA dzs->CABG advised, pt preferred PCI-->Cypher DES to mRCA & mLAD; b. 02/2008 PCI: ISR of RCA->Cypher DES. LAD patent; c. 07/2020 Inf STEMI/PCI: LM 30d, LAD 31m, D1 80(small), RI 75, LCX 80ost->distal (small), RCA 25p/m, 171m/d (3.0x26 Resolute Onyx DES).  ? Essential hypertension   ? HFrEF (heart failure with reduced ejection fraction) ([place])   ? a. 07/2020 Echo: EF 45-50% in setting of inf STEMI; b. 01/2021 Echo: EF 40-45%, glob HK, sev basal-septal LVH w/ grII DD. Nl RV fxn, RVSP 37.42mmHg, sev dil LA, mild MR, mod AS.  ? Hyperlipidemia   ? Ischemic cardiomyopathy   ? a. 07/2020 Echo: EF 45-50%; b. 01/2021 Echo: EF 40-45%.  ? Memory disorder 07/08/2017  ? Moderate aortic stenosis   ? a. 01/2021 Echo: Mild-mod AS w/ mean grad 40mmHg. AoV area 2.53m/s (Vmax). In setting of LV dysfxn, AS likely more moderate.  ? Myocardial infarction [facility])   ? Stroke [facility])   ?  Type 2 diabetes mellitus ([place])   ? Vascular dementia ([place])   ? ? ?Past Surgical History:  ?Procedure Laterality Date  ? ATRIAL FLUTTER ABLATION N/A 06/17/2013  ? Procedure: ATRIAL FLUTTER ABLATION;  Surgeon: Thompson Grayer, MD;  Location: [facility] CATH LAB;  Service: Cardiovascular;  Laterality: N/A;  ? CAROTID PTA/STENT INTERVENTION N/A 04/10/2017  ? Procedure: Carotid PTA/Stent Intervention;  Surgeon: Algernon Huxley, MD;  Location: [place] CV LAB;  Service: Cardiovascular;  Laterality: N/A;  ? CHOLECYSTECTOMY    ? CORONARY STENT INTERVENTION N/A 07/14/2020  ? Procedure: CORONARY STENT INTERVENTION;  Surgeon: Sherren Mocha, MD;  Location: [place] CV LAB;  Service:  Cardiovascular;  Laterality: N/A;  ? HIP SURGERY    ? KNEE ARTHROSCOPY WITH MEDIAL MENISECTOMY Left 09/25/2017  ? Procedure: KNEE ARTHROSCOPY WITH MEDIAL AND LATERAL MENISECTOMY;  Surgeon: [name], MD;  Location: [facility];  Service: Orthopedics;  Laterality: Left;  ? LEFT HEART CATH AND CORONARY ANGIOGRAPHY N/A 07/14/2020  ? Procedure: LEFT HEART CATH AND CORONARY ANGIOGRAPHY;  Surgeon: Sherren Mocha, MD;  Location: [place] CV LAB;  Service: Cardiovascular;  Laterality: N/A;  ? RIGHT/LEFT HEART CATH AND CORONARY ANGIOGRAPHY N/A 02/05/2021  ? Procedure: RIGHT/LEFT HEART CATH AND CORONARY ANGIOGRAPHY;  Surgeon: Lorretta Harp, MD;  Location: [place] CV LAB;  Service: Cardiovascular;  Laterality: N/A;  ? ? ?Current Medications: ?Outpatient Medications Prior to Visit  ?Medication Sig Dispense Refill  ? albuterol (VENTOLIN HFA) 108 (90 Base) MCG/ACT inhaler Inhale 2 puffs into the lungs every 6 (six) hours as needed for wheezing or shortness of breath. 8 g 2  ? ALPRAZolam (XANAX) 0.25 MG tablet Take 0.125-0.25 mg by mouth See admin instructions. 0.125 in the morning. 0.25 mg at bedtime    ? Ascorbic Acid (VITAMIN C GUMMIES PO) Take 2 each by mouth daily. 180 mg    ? atorvastatin (LIPITOR) 40 MG tablet Take 1 tablet (40 mg total) by mouth daily. 30 tablet 1  ? b complex vitamins capsule Take 1 capsule by mouth daily.    ? cholecalciferol (VITAMIN D3) 25 MCG (1000 UNIT) tablet Take 1,000 Units by mouth daily.    ? clopidogrel (PLAVIX) 75 MG tablet Take 1 tablet by mouth once daily with breakfast 90 tablet 2  ? Coenzyme Q10 (CO Q 10) 100 MG CAPS Take 100 mg by mouth daily.    ? furosemide (LASIX) 20 MG tablet Take 1 tablet (20 mg total) by mouth 2 (two) times daily. 180 tablet 2  ? glipiZIDE (GLUCOTROL XL) 5 MG 24 hr tablet Take 5 mg by mouth in the morning and at bedtime.    ? hydrALAZINE (APRESOLINE) 10 MG tablet Take 1 tablet (10 mg total) by mouth every 8 (eight) hours. 90 tablet 6  ? isosorbide  mononitrate (IMDUR) 30 MG 24 hr tablet Take 1 tablet (30 mg total) by mouth daily. 90 tablet 3  ? MEGARED OMEGA-3 KRILL OIL PO Take 1,000 mg by mouth daily. When she remembers    ? nitroGLYCERIN (NITROSTAT) 0.4 MG SL tablet Place 1 tablet (0.4 mg total) under the tongue every 5 (five) minutes x 3 doses as needed for chest pain. 25 tablet 3  ? Polyethyl Glycol-Propyl Glycol (SYSTANE OP) Apply 1 drop to eye 3 (three) times daily as needed (dry eyes).    ? vitamin E 180 MG (400 UNITS) capsule Take 400 Units by mouth daily.    ? warfarin (COUMADIN) 5 MG tablet Take 5 mg by mouth  every evening.    ? carvedilol (COREG) 6.25 MG tablet Take 1 tablet (6.25 mg total) by mouth 2 (two) times daily. 180 tablet 3  ? predniSONE (DELTASONE) 50 MG tablet 1 tablet PO QD X4 days 4 tablet 0  ? ?No facility-administered medications prior to visit.  ?  ? ?Allergies:   Patient has no known allergies.  ? ?Social History  ? ?Socioeconomic History  ? Marital status: Married  ?  Spouse name: Not on file  ? Number of children: 3  ? Years of education: Some college  ? Highest education level: Not on file  ?Occupational History  ? Occupation: Retired  ?Tobacco Use  ? Smoking status: Former  ?  Years: 10.00  ?  Types: Cigarettes  ?  Quit date: 10/14/1978  ?  Years since quitting: 43.3  ? Smokeless tobacco: Never  ?Vaping Use  ? Vaping Use: Never used  ?Substance and Sexual Activity  ? Alcohol use: No  ? Drug use: No  ? Sexual activity: Not on file  ?Other Topics Concern  ? Not on file  ?Social History Narrative  ? Lives in [place].  Her college aged grandson lives with her.  She ambulates with a walker and thus does not routinely exercise.  ? ?Social Determinants of Health  ? ?Financial Resource Strain: Not on file  ?Food Insecurity: Not on file  ?Transportation Needs: Not on file  ?Physical Activity: Not on file  ?Stress: Not on file  ?Social Connections: Not on file  ?  ? ?Family History:  The patient's family history includes Arthritis in an other  family member; Cancer in an other family member; Diabetes in an other family member; Heart disease in an other family member; Heart failure in her mother.  ? ?Review of Systems:   ? ?Please see the history

## 2022-01-30 ENCOUNTER — Ambulatory Visit (INDEPENDENT_AMBULATORY_CARE_PROVIDER_SITE_OTHER): Payer: Medicare Other | Admitting: Student

## 2022-01-30 ENCOUNTER — Encounter: Payer: Self-pay | Admitting: Student

## 2022-01-30 VITALS — BP 126/60 | HR 104 | Ht 65.5 in | Wt 175.4 lb

## 2022-01-30 DIAGNOSIS — I5042 Chronic combined systolic (congestive) and diastolic (congestive) heart failure: Secondary | ICD-10-CM | POA: Diagnosis not present

## 2022-01-30 DIAGNOSIS — I2510 Atherosclerotic heart disease of native coronary artery without angina pectoris: Secondary | ICD-10-CM | POA: Diagnosis not present

## 2022-01-30 DIAGNOSIS — Z86711 Personal history of pulmonary embolism: Secondary | ICD-10-CM

## 2022-01-30 DIAGNOSIS — I4892 Unspecified atrial flutter: Secondary | ICD-10-CM

## 2022-01-30 DIAGNOSIS — I350 Nonrheumatic aortic (valve) stenosis: Secondary | ICD-10-CM | POA: Diagnosis not present

## 2022-01-30 MED ORDER — CARVEDILOL 12.5 MG PO TABS: 12.5000 mg | ORAL_TABLET | Freq: Two times a day (BID) | ORAL | 3 refills | Status: DC

## 2022-01-30 NOTE — Patient Instructions (Addendum)
Medication Instructions:  ?Increase Coreg to 12.5 mg tablets twice daily ? ?Follow-Up: ?Follow up with Dr. Domenic Polite or Bernerd Pho, PA-C in 2-3 months.  ? ?Any Other Special Instructions Will Be Listed Below (If Applicable). ? ?Please track heart rate and blood pressure for 2 weeks and report back to [place]. You may send a MyChart message.  ? ? ? ?If you need a refill on your cardiac medications before your next appointment, please call your pharmacy. ? ?

## 2022-02-13 ENCOUNTER — Telehealth: Payer: Self-pay | Admitting: Student

## 2022-02-13 NOTE — Telephone Encounter (Signed)
Returned call to [facility] daughter. No answer. Left msg to call back.  ?

## 2022-02-13 NOTE — Telephone Encounter (Signed)
? ? ?  Given the recent increase of Coreg, please have her STOP Hydralazine. Continue to follow HR and BP and report back with readings in a week.  If BP remains soft, we may need to switch Coreg to Toprol-XL. ? ?Signed, ?Erma Heritage, PA-C ?02/13/2022, 4:07 PM ? ? ?

## 2022-02-13 NOTE — Telephone Encounter (Signed)
Discussed low blood pressures after carvedilol uptitration.  She is on hydralazine.  Reviewed chart.  Plan to decrease hydralazine dose.  Confirmed plan.  Will back off on carvedilol if this doesn't work.  I query utility of isosorbide. ? ?C. Albertha Ghee, MD ?Cardiology Fellow ?[facility] ? ?

## 2022-02-13 NOTE — Telephone Encounter (Signed)
Pt c/o BP issue: STAT if pt c/o blurred vision, one-sided weakness or slurred speech ? ?1. What are your last 5 BP readings? 106/74; 80; 69/51; 89; 111/67; 104/69; 111/64 ? ?2. Are you having any other symptoms (ex. Dizziness, headache, blurred vision, passed out)? Weak ? ?3. What is your BP issue? Patient feels really bad and is really weak  ?

## 2022-02-13 NOTE — Telephone Encounter (Signed)
Spoke with daughter [name] who states that pt's BP is low in the morning after taking her meds. Daughter reports that they take pt's BP 2 hours after taking morning meds. She reports that pt is feeling better at this time and BP is 107/?. Daughter will keep a closer watch on evening BP's and report. Please advise.  ?

## 2022-02-14 NOTE — Telephone Encounter (Signed)
Pt's daughter notified of providers advice. Pt will stop Hydralazine and continue to track hr/bp for a week, then update office. Pt's daughter had no other questions or concerns at this time.  ?

## 2022-03-15 ENCOUNTER — Other Ambulatory Visit: Payer: Self-pay | Admitting: Student

## 2022-03-19 ENCOUNTER — Ambulatory Visit (INDEPENDENT_AMBULATORY_CARE_PROVIDER_SITE_OTHER): Payer: Medicare Other | Admitting: *Deleted

## 2022-03-19 DIAGNOSIS — Z5181 Encounter for therapeutic drug level monitoring: Secondary | ICD-10-CM

## 2022-03-19 DIAGNOSIS — I4892 Unspecified atrial flutter: Secondary | ICD-10-CM | POA: Diagnosis not present

## 2022-03-19 LAB — POCT INR: INR: 2.9 (ref 2.0–3.0)

## 2022-03-19 NOTE — Patient Instructions (Signed)
Continue warfarin 1 tablet daily Recheck in 4 wks 

## 2022-04-12 DIAGNOSIS — E785 Hyperlipidemia, unspecified: Secondary | ICD-10-CM | POA: Diagnosis not present

## 2022-04-12 DIAGNOSIS — I10 Essential (primary) hypertension: Secondary | ICD-10-CM | POA: Diagnosis not present

## 2022-04-19 ENCOUNTER — Encounter: Payer: Self-pay | Admitting: *Deleted

## 2022-05-01 DIAGNOSIS — I482 Chronic atrial fibrillation, unspecified: Secondary | ICD-10-CM | POA: Diagnosis not present

## 2022-05-01 DIAGNOSIS — Z86711 Personal history of pulmonary embolism: Secondary | ICD-10-CM | POA: Diagnosis not present

## 2022-05-01 DIAGNOSIS — D509 Iron deficiency anemia, unspecified: Secondary | ICD-10-CM | POA: Diagnosis not present

## 2022-05-01 DIAGNOSIS — I10 Essential (primary) hypertension: Secondary | ICD-10-CM | POA: Diagnosis not present

## 2022-05-01 DIAGNOSIS — J309 Allergic rhinitis, unspecified: Secondary | ICD-10-CM | POA: Diagnosis not present

## 2022-05-01 DIAGNOSIS — I5033 Acute on chronic diastolic (congestive) heart failure: Secondary | ICD-10-CM | POA: Diagnosis not present

## 2022-05-01 DIAGNOSIS — I2510 Atherosclerotic heart disease of native coronary artery without angina pectoris: Secondary | ICD-10-CM | POA: Diagnosis not present

## 2022-05-01 DIAGNOSIS — E1165 Type 2 diabetes mellitus with hyperglycemia: Secondary | ICD-10-CM | POA: Diagnosis not present

## 2022-05-01 DIAGNOSIS — F419 Anxiety disorder, unspecified: Secondary | ICD-10-CM | POA: Diagnosis not present

## 2022-05-01 DIAGNOSIS — N184 Chronic kidney disease, stage 4 (severe): Secondary | ICD-10-CM | POA: Diagnosis not present

## 2022-05-01 DIAGNOSIS — E782 Mixed hyperlipidemia: Secondary | ICD-10-CM | POA: Diagnosis not present

## 2022-05-01 DIAGNOSIS — R809 Proteinuria, unspecified: Secondary | ICD-10-CM | POA: Diagnosis not present

## 2022-05-02 NOTE — Progress Notes (Signed)
Cardiology Office Note    Date:  05/03/2022   ID:  [name], DOB May 04, 1941, MRN [id number]  PCP:  Celene Squibb, MD  Cardiologist: Rozann Lesches, MD    Chief Complaint  Patient presents with   Follow-up    3 month visit    History of Present Illness:    [name] is a 81 y.o. female with past medical history of CAD (s/p DES to mid-RCA and DES to mid-LAD in 2006, DES to ISR of RCA in 2009, inferior STEMI in 07/2020 with ISR of RCA and treated with DES, s/p NSTEMI in 01/2021 with cath showing patent stents with no culprit lesions), HFrEF/ICM (EF 40-45% by echo in 01/2021, EF at 45-50% in 07/2021), atrial flutter (s/p ablation in 2014), history of PE (on Coumadin), HTN, HLD, Type 2 DM, Stage 3 CKD, prior CVA and aortic stenosis who presents to the office today for 79-month follow-up.  She was examined by myself in 01/2022 and reported that her breathing had been stable and denied any recent chest pain or palpitations. She had been treated for respiratory infections by her PCP with improvement in her symptoms. Repeat EKG did show that she was in atrial flutter and given rates in the 90's to low 100's, Coreg was titrated to 12.5 mg twice daily. A possible DCCV was reviewed but she wished to avoid further procedures at that time. She did call the office back reporting issues with hypotension and Hydralazine was discontinued.  In talking with the patient and her daughter today, she reports overall feeling very well since her last office visit. She continues to go to the [facility] several days a week and enjoys exercise classes and activities there. She denies any recent chest pain or palpitations. No recent orthopnea, PND or pitting edema. She remains on Coumadin for anticoagulation with no recent melena, hematochezia or hematuria. Was previously having epistaxis during the winter months but this has improved.   Past Medical History:  Diagnosis Date   Arthritis    Atrial  flutter ([place])    a. 06/2013 s/p RFCA.   Bilateral pulmonary embolism ([place]) 08/2008   a. Chronic coumadin.   Carotid arterial disease ([place])    a. 03/2017 Carotid angio: RICA 89-38%, LICA 10-17%.   CHF (congestive heart failure) (HCC)    CKD (chronic kidney disease), stage III (HCC)    Coronary artery disease    a. 05/2005 Inf STEMI/PCI: severe LAD/LCX/RCA dzs->CABG advised, pt preferred PCI-->Cypher DES to mRCA & mLAD; b. 02/2008 PCI: ISR of RCA->Cypher DES. LAD patent; c. 07/2020 Inf STEMI/PCI: LM 30d, LAD 49m, D1 80(small), RI 75, LCX 80ost->distal (small), RCA 25p/m, 126m/d (3.0x26 Resolute Onyx DES).   Essential hypertension    HFrEF (heart failure with reduced ejection fraction) ([place])    a. 07/2020 Echo: EF 45-50% in setting of inf STEMI; b. 01/2021 Echo: EF 40-45%, glob HK, sev basal-septal LVH w/ grII DD. Nl RV fxn, RVSP 37.28mmHg, sev dil LA, mild MR, mod AS.   Hyperlipidemia    Ischemic cardiomyopathy    a. 07/2020 Echo: EF 45-50%; b. 01/2021 Echo: EF 40-45%.   Memory disorder 07/08/2017   Moderate aortic stenosis    a. 01/2021 Echo: Mild-mod AS w/ mean grad 81mmHg. AoV area 2.16m/s (Vmax). In setting of LV dysfxn, AS likely more moderate.   Myocardial infarction [facility])    Stroke [facility])    Type 2 diabetes mellitus ([place])    Vascular dementia ([place])  Past Surgical History:  Procedure Laterality Date   ATRIAL FLUTTER ABLATION N/A 06/17/2013   Procedure: ATRIAL FLUTTER ABLATION;  Surgeon: Thompson Grayer, MD;  Location: [facility] CATH LAB;  Service: Cardiovascular;  Laterality: N/A;   CAROTID PTA/STENT INTERVENTION N/A 04/10/2017   Procedure: Carotid PTA/Stent Intervention;  Surgeon: Algernon Huxley, MD;  Location: [place] CV LAB;  Service: Cardiovascular;  Laterality: N/A;   CHOLECYSTECTOMY     CORONARY STENT INTERVENTION N/A 07/14/2020   Procedure: CORONARY STENT INTERVENTION;  Surgeon: Sherren Mocha, MD;  Location: [place] CV LAB;  Service: Cardiovascular;  Laterality: N/A;   HIP SURGERY      KNEE ARTHROSCOPY WITH MEDIAL MENISECTOMY Left 09/25/2017   Procedure: KNEE ARTHROSCOPY WITH MEDIAL AND LATERAL MENISECTOMY;  Surgeon: [name], MD;  Location: [facility];  Service: Orthopedics;  Laterality: Left;   LEFT HEART CATH AND CORONARY ANGIOGRAPHY N/A 07/14/2020   Procedure: LEFT HEART CATH AND CORONARY ANGIOGRAPHY;  Surgeon: Sherren Mocha, MD;  Location: [place] CV LAB;  Service: Cardiovascular;  Laterality: N/A;   RIGHT/LEFT HEART CATH AND CORONARY ANGIOGRAPHY N/A 02/05/2021   Procedure: RIGHT/LEFT HEART CATH AND CORONARY ANGIOGRAPHY;  Surgeon: Lorretta Harp, MD;  Location: [place] CV LAB;  Service: Cardiovascular;  Laterality: N/A;    Current Medications: Outpatient Medications Prior to Visit  Medication Sig Dispense Refill   albuterol (VENTOLIN HFA) 108 (90 Base) MCG/ACT inhaler Inhale 2 puffs into the lungs every 6 (six) hours as needed for wheezing or shortness of breath. 8 g 2   ALPRAZolam (XANAX) 0.25 MG tablet Take 0.125-0.25 mg by mouth See admin instructions. 0.125 in the morning. 0.25 mg at bedtime     Ascorbic Acid (VITAMIN C GUMMIES PO) Take 2 each by mouth daily. 180 mg     atorvastatin (LIPITOR) 40 MG tablet Take 1 tablet (40 mg total) by mouth daily. 30 tablet 1   b complex vitamins capsule Take 1 capsule by mouth daily.     carvedilol (COREG) 12.5 MG tablet Take 1 tablet (12.5 mg total) by mouth 2 (two) times daily. 180 tablet 3   cholecalciferol (VITAMIN D3) 25 MCG (1000 UNIT) tablet Take 1,000 Units by mouth daily.     clopidogrel (PLAVIX) 75 MG tablet Take 1 tablet by mouth once daily with breakfast 90 tablet 2   Coenzyme Q10 (CO Q 10) 100 MG CAPS Take 100 mg by mouth daily.     furosemide (LASIX) 20 MG tablet Take 1 tablet (20 mg total) by mouth 2 (two) times daily. 180 tablet 2   glipiZIDE (GLUCOTROL XL) 5 MG 24 hr tablet Take 5 mg by mouth in the morning and at bedtime.     isosorbide mononitrate (IMDUR) 30 MG 24 hr tablet Take 1 tablet by  mouth once daily 90 tablet 1   MEGARED OMEGA-3 KRILL OIL PO Take 1,000 mg by mouth daily. When she remembers     nitroGLYCERIN (NITROSTAT) 0.4 MG SL tablet Place 1 tablet (0.4 mg total) under the tongue every 5 (five) minutes x 3 doses as needed for chest pain. 25 tablet 3   Polyethyl Glycol-Propyl Glycol (SYSTANE OP) Apply 1 drop to eye 3 (three) times daily as needed (dry eyes).     vitamin E 180 MG (400 UNITS) capsule Take 400 Units by mouth daily.     warfarin (COUMADIN) 5 MG tablet Take 5 mg by mouth every evening.     No facility-administered medications prior to visit.     Allergies:  Patient has no known allergies.   Social History   Socioeconomic History   Marital status: Married    Spouse name: Not on file   Number of children: 3   Years of education: Some college   Highest education level: Not on file  Occupational History   Occupation: Retired  Tobacco Use   Smoking status: Former    Years: 10.00    Types: Cigarettes    Quit date: 10/14/1978    Years since quitting: 43.5   Smokeless tobacco: Never  Vaping Use   Vaping Use: Never used  Substance and Sexual Activity   Alcohol use: No   Drug use: No   Sexual activity: Not on file  Other Topics Concern   Not on file  Social History Narrative   Lives in [place].  Her college aged grandson lives with her.  She ambulates with a walker and thus does not routinely exercise.   Social Determinants of Health   Financial Resource Strain: Not on file  Food Insecurity: Not on file  Transportation Needs: Not on file  Physical Activity: Not on file  Stress: Not on file  Social Connections: Not on file     Family History:  The patient's family history includes Arthritis in an other family member; Cancer in an other family member; Diabetes in an other family member; Heart disease in an other family member; Heart failure in her mother.   Review of Systems:    Please see the history of present illness.     All other systems  reviewed and are otherwise negative except as noted above.   Physical Exam:    VS:  BP 100/62   Pulse 74   Ht 5' 5.5" (1.664 m)   Wt 183 lb (83 kg)   SpO2 96%   BMI 29.99 kg/m    General: Pleasant elderly female appearing in no acute distress. Head: Normocephalic, atraumatic. Neck: No carotid bruits. JVD not elevated.  Lungs: Respirations regular and unlabored, without wheezes or rales.  Heart: Irregular irregular. No S3 or S4. 2/6 SEM along RUSB Abdomen: Appears non-distended. No obvious abdominal masses. Msk:  Strength and tone appear normal for age. No obvious joint deformities or effusions. Extremities: No clubbing or cyanosis. No pitting edema.  Distal pedal pulses are 2+ bilaterally. Neuro: Alert and oriented X 3. Moves all extremities spontaneously. No focal deficits noted. Psych:  Responds to questions appropriately with a normal affect. Skin: No rashes or lesions noted  Wt Readings from Last 3 Encounters:  05/03/22 183 lb (83 kg)  01/30/22 175 lb 6.4 oz (79.6 kg)  10/11/21 181 lb (82.1 kg)     Studies/Labs Reviewed:   EKG:  EKG is not ordered today. The ekg ordered today demonstrates rate-controlled atrial flutter, HR 76 with RAD and no acute ST changes.   Recent Labs: 10/10/2021: ALT 19; BUN 50; Creatinine, Ser 2.10; Hemoglobin 11.1; Platelets 295; Potassium 4.5; Sodium 139   Lipid Panel    Component Value Date/Time   CHOL 177 07/14/2020 0038   TRIG 78 07/14/2020 0212   HDL 62 07/14/2020 0038   CHOLHDL 2.9 07/14/2020 0038   VLDL 24 07/14/2020 0038   LDLCALC 91 07/14/2020 0038    Additional studies/ records that were reviewed today include:   R/LHC: 01/2021 Previously placed Prox RCA to Dist RCA stent (unknown type) is widely patent. Previously placed Mid LAD stent (unknown type) is widely patent. 1st Diag lesion is 75% stenosed. Lat Ramus lesion is 99% stenosed.  Ramus lesion is 50% stenosed. Ost Cx to Prox Cx lesion is 50% stenosed. Mid LM to Dist LM  lesion is 30% stenosed. Hemodynamic findings consistent with aortic valve stenosis. IMPRESSION: Ms. Hutsell's LAD and RCA stents are widely patent.  She has moderate disease in a small ramus and nondominant circumflex.  Her pulmonary capillary wedge pressure was moderately elevated.  I believe her symptoms are related to her aortic stenosis which I suspect is more severe than the ultrasound indicates probably from "low output" aortic stenosis.  She may be a candidate for TAVR however she does have moderate renal insufficiency and cognitive impairment.  A right common femoral angiogram was performed and [name] closure devices were successfully deployed in the right common femoral artery and vein achieving hemostasis.  The patient left lab in stable condition.   Echocardiogram: 07/2021 IMPRESSIONS     1. Left ventricular ejection fraction, by estimation, is 45 to 50%. The  left ventricle has mildly decreased function. The left ventricle  demonstrates global hypokinesis. The left ventricular internal cavity size  was mildly dilated. There is mild left  ventricular hypertrophy. Left ventricular diastolic parameters are  consistent with Grade II diastolic dysfunction (pseudonormalization).   2. Right ventricular systolic function is normal. The right ventricular  size is normal. There is normal pulmonary artery systolic pressure. The  estimated right ventricular systolic pressure is 67.6 mmHg.   3. Left atrial size was markedly dilated.   4. There is a trivial pericardial effusion posterior to the left  ventricle.   5. The mitral valve is abnormal. Mild mitral valve regurgitation. The  mean mitral valve gradient is 2.0 mmHg. Moderate mitral annular  calcification.   6. The aortic valve is tricuspid. There is moderate calcification of the  aortic valve. Aortic valve regurgitation is not visualized. Moderate  aortic valve stenosis. Aortic valve area, by VTI measures 1.49 cm. Aortic  valve mean  gradient measures 12.5  mmHg. Dimentionless index 0.39.   7. The inferior vena cava is normal in size with greater than 50%  respiratory variability, suggesting right atrial pressure of 3 mmHg.   Comparison(s): Prior images reviewed side by side. LVEF has improved.  Aortic stenosis is moderate range.   Assessment:    1. Coronary artery disease involving native coronary artery of native heart without angina pectoris   2. Atrial flutter, unspecified type ([place])   3. Chronic combined systolic and diastolic heart failure ([place])   4. Nonrheumatic aortic valve stenosis   5. History of pulmonary embolus (PE)      Plan:   In order of problems listed above:  1. CAD - She is s/p DES to mid-RCA and DES to mid-LAD in 2006, DES to ISR of RCA in 2009, inferior STEMI in 07/2020 with ISR of RCA and treated with DES and NSTEMI in 01/2021 with cath showing patent stents with no culprit lesions.  - She remains active at baseline for her age and denies any recent anginal symptoms. Continue Atorvastatin 40 mg daily, Imdur 30 mg daily and Coreg 12.5 mg twice daily. She is no longer on ASA given the need for anticoagulation.  2. Atypical Atrial Flutter - She underwent prior ablation in 2014 and was found to have recurrent atrial flutter during recent office visit in 01/2022. She did not wish to pursue further procedures at that time and a rate control strategy was pursued with Coreg being titrated from 6.25 mg twice daily to 12.5 mg twice daily.   - She  overall feels well with this and denies any palpitations. Her heart rate has improved in the 70's.  Continue Coreg at current dosing. She is on Coumadin for anticoagulation.  3. HFrEF/ICM - Her EF was at 45-50% in 07/2021. She appears euvolemic on examination today and denies any recent orthopnea, PND or pitting edema. Will plan for a repeat echocardiogram prior to her next visit. Continue current medical therapy with Coreg 12.5 mg twice daily, Lasix 20 mg  twice daily and Imdur 30 mg daily. BP has not allowed for further titration of medical therapy. Could consider an SGLT2 inhibitor if EF remains reduced.  4. Aortic Stenosis - This was moderate by echocardiogram in 07/2021. Will plan for repeat imaging prior to her next visit.  5. History of PE  - She remains on Coumadin for anticoagulation which is followed by the Coumadin clinic in [place]. INR was at 2.9 when checked on 03/19/2022 and she says this was checked by her PCP recently and stable. No reports of active bleeding.     Medication Adjustments/Labs and Tests Ordered: Current medicines are reviewed at length with the patient today.  Concerns regarding medicines are outlined above.  Medication changes, Labs and Tests ordered today are listed in the Patient Instructions below. Patient Instructions  Medication Instructions:  Your physician recommends that you continue on your current medications as directed. Please refer to the Current Medication list given to you today.  *If you need a refill on your cardiac medications before your next appointment, please call your pharmacy*   Lab Work: NONE   If you have labs (blood work) drawn today and your tests are completely normal, you will receive your results only by: [place] (if you have MyChart) OR A paper copy in the mail If you have any lab test that is abnormal or we need to change your treatment, we will call you to review the results.   Testing/Procedures: Your physician has requested that you have an echocardiogram. Echocardiography is a painless test that uses sound waves to create images of your heart. It provides your doctor with information about the size and shape of your heart and how well your heart's chambers and valves are working. This procedure takes approximately one hour. There are no restrictions for this procedure.    Follow-Up: At [facility], you and your health needs are our priority.  As part of our  continuing mission to provide you with exceptional heart care, we have created designated Provider Care Teams.  These Care Teams include your primary Cardiologist (physician) and Advanced Practice Providers (APPs -  Physician Assistants and Nurse Practitioners) who all work together to provide you with the care you need, when you need it.  We recommend signing up for the patient portal called "MyChart".  Sign up information is provided on this After Visit Summary.  MyChart is used to connect with patients for Virtual Visits (Telemedicine).  Patients are able to view lab/test results, encounter notes, upcoming appointments, etc.  Non-urgent messages can be sent to your provider as well.   To learn more about what you can do with MyChart, go to [email or web address].    Your next appointment:   6 month(s)  The format for your next appointment:   In Person  Provider:   You may see Rozann Lesches, MD or one of the following Advanced Practice Providers on your designated Care Team:   Bernerd Pho, PA-C  Ermalinda Barrios, PA-C     Other Instructions Thank you  for choosing [facility]!    Important Information About Sugar         Signed, Erma Heritage, PA-C  05/03/2022 4:06 PM    [facility] [address] [place], [place] [address] Phone: [phone] Fax: [phone]

## 2022-05-03 ENCOUNTER — Encounter: Payer: Self-pay | Admitting: *Deleted

## 2022-05-03 ENCOUNTER — Ambulatory Visit (INDEPENDENT_AMBULATORY_CARE_PROVIDER_SITE_OTHER): Payer: Medicare Other | Admitting: Student

## 2022-05-03 ENCOUNTER — Encounter: Payer: Self-pay | Admitting: Student

## 2022-05-03 VITALS — BP 100/62 | HR 74 | Ht 65.5 in | Wt 183.0 lb

## 2022-05-03 DIAGNOSIS — I2510 Atherosclerotic heart disease of native coronary artery without angina pectoris: Secondary | ICD-10-CM

## 2022-05-03 DIAGNOSIS — Z86711 Personal history of pulmonary embolism: Secondary | ICD-10-CM | POA: Diagnosis not present

## 2022-05-03 DIAGNOSIS — I4892 Unspecified atrial flutter: Secondary | ICD-10-CM | POA: Diagnosis not present

## 2022-05-03 DIAGNOSIS — I5042 Chronic combined systolic (congestive) and diastolic (congestive) heart failure: Secondary | ICD-10-CM

## 2022-05-03 DIAGNOSIS — I350 Nonrheumatic aortic (valve) stenosis: Secondary | ICD-10-CM

## 2022-05-03 NOTE — Patient Instructions (Signed)
Medication Instructions:  Your physician recommends that you continue on your current medications as directed. Please refer to the Current Medication list given to you today.  *If you need a refill on your cardiac medications before your next appointment, please call your pharmacy*   Lab Work: NONE   If you have labs (blood work) drawn today and your tests are completely normal, you will receive your results only by: [place] (if you have MyChart) OR A paper copy in the mail If you have any lab test that is abnormal or we need to change your treatment, we will call you to review the results.   Testing/Procedures: Your physician has requested that you have an echocardiogram. Echocardiography is a painless test that uses sound waves to create images of your heart. It provides your doctor with information about the size and shape of your heart and how well your heart's chambers and valves are working. This procedure takes approximately one hour. There are no restrictions for this procedure.    Follow-Up: At [facility], you and your health needs are our priority.  As part of our continuing mission to provide you with exceptional heart care, we have created designated Provider Care Teams.  These Care Teams include your primary Cardiologist (physician) and Advanced Practice Providers (APPs -  Physician Assistants and Nurse Practitioners) who all work together to provide you with the care you need, when you need it.  We recommend signing up for the patient portal called "MyChart".  Sign up information is provided on this After Visit Summary.  MyChart is used to connect with patients for Virtual Visits (Telemedicine).  Patients are able to view lab/test results, encounter notes, upcoming appointments, etc.  Non-urgent messages can be sent to your provider as well.   To learn more about what you can do with MyChart, go to [email or web address].    Your next appointment:   6  month(s)  The format for your next appointment:   In Person  Provider:   You may see Rozann Lesches, MD or one of the following Advanced Practice Providers on your designated Care Team:   Bernerd Pho, PA-C  Ermalinda Barrios, PA-C     Other Instructions Thank you for choosing [facility]!    Important Information About Sugar

## 2022-05-06 ENCOUNTER — Encounter: Payer: Self-pay | Admitting: Internal Medicine

## 2022-06-22 ENCOUNTER — Other Ambulatory Visit: Payer: Self-pay | Admitting: Student

## 2022-07-18 ENCOUNTER — Other Ambulatory Visit: Payer: Self-pay | Admitting: Cardiology

## 2022-07-19 DIAGNOSIS — R350 Frequency of micturition: Secondary | ICD-10-CM | POA: Diagnosis not present

## 2022-07-19 DIAGNOSIS — M6281 Muscle weakness (generalized): Secondary | ICD-10-CM | POA: Diagnosis not present

## 2022-07-29 DIAGNOSIS — Z23 Encounter for immunization: Secondary | ICD-10-CM | POA: Diagnosis not present

## 2022-07-29 DIAGNOSIS — R319 Hematuria, unspecified: Secondary | ICD-10-CM | POA: Diagnosis not present

## 2022-07-30 ENCOUNTER — Other Ambulatory Visit (HOSPITAL_COMMUNITY): Payer: Self-pay | Admitting: Family Medicine

## 2022-07-30 DIAGNOSIS — R319 Hematuria, unspecified: Secondary | ICD-10-CM

## 2022-07-31 ENCOUNTER — Other Ambulatory Visit (HOSPITAL_COMMUNITY): Payer: Self-pay | Admitting: Family Medicine

## 2022-07-31 ENCOUNTER — Ambulatory Visit (HOSPITAL_COMMUNITY)
Admission: RE | Admit: 2022-07-31 | Discharge: 2022-07-31 | Disposition: A | Discharge status: Home / Self Care | Payer: Medicare Other | Source: Ambulatory Visit | Attending: Family Medicine | Admitting: Family Medicine

## 2022-07-31 DIAGNOSIS — R319 Hematuria, unspecified: Secondary | ICD-10-CM | POA: Diagnosis not present

## 2022-07-31 LAB — POCT I-STAT CREATININE: Creatinine, Ser: 2.1 mg/dL — ABNORMAL HIGH (ref 0.44–1.00)

## 2022-07-31 MED ORDER — IOHEXOL 350 MG/ML SOLN: 80.0000 mL | Freq: Once | INTRAVENOUS | Status: DC | PRN

## 2022-08-05 DIAGNOSIS — I10 Essential (primary) hypertension: Secondary | ICD-10-CM | POA: Diagnosis not present

## 2022-08-05 DIAGNOSIS — E785 Hyperlipidemia, unspecified: Secondary | ICD-10-CM | POA: Diagnosis not present

## 2022-08-05 DIAGNOSIS — E119 Type 2 diabetes mellitus without complications: Secondary | ICD-10-CM | POA: Diagnosis not present

## 2022-08-09 DIAGNOSIS — I10 Essential (primary) hypertension: Secondary | ICD-10-CM | POA: Diagnosis not present

## 2022-08-09 DIAGNOSIS — R809 Proteinuria, unspecified: Secondary | ICD-10-CM | POA: Diagnosis not present

## 2022-08-09 DIAGNOSIS — E782 Mixed hyperlipidemia: Secondary | ICD-10-CM | POA: Diagnosis not present

## 2022-08-09 DIAGNOSIS — E1165 Type 2 diabetes mellitus with hyperglycemia: Secondary | ICD-10-CM | POA: Diagnosis not present

## 2022-08-09 DIAGNOSIS — N184 Chronic kidney disease, stage 4 (severe): Secondary | ICD-10-CM | POA: Diagnosis not present

## 2022-08-09 DIAGNOSIS — Z86711 Personal history of pulmonary embolism: Secondary | ICD-10-CM | POA: Diagnosis not present

## 2022-08-09 DIAGNOSIS — R319 Hematuria, unspecified: Secondary | ICD-10-CM | POA: Diagnosis not present

## 2022-08-09 DIAGNOSIS — D509 Iron deficiency anemia, unspecified: Secondary | ICD-10-CM | POA: Diagnosis not present

## 2022-08-09 DIAGNOSIS — I5033 Acute on chronic diastolic (congestive) heart failure: Secondary | ICD-10-CM | POA: Diagnosis not present

## 2022-08-09 DIAGNOSIS — I482 Chronic atrial fibrillation, unspecified: Secondary | ICD-10-CM | POA: Diagnosis not present

## 2022-08-09 DIAGNOSIS — E875 Hyperkalemia: Secondary | ICD-10-CM | POA: Diagnosis not present

## 2022-08-12 ENCOUNTER — Encounter: Payer: Self-pay | Admitting: Family Medicine

## 2022-09-13 ENCOUNTER — Telehealth: Payer: Self-pay | Admitting: Cardiology

## 2022-09-13 MED ORDER — FUROSEMIDE 20 MG PO TABS: 20.0000 mg | ORAL_TABLET | Freq: Two times a day (BID) | ORAL | 2 refills | Status: DC

## 2022-09-13 NOTE — Telephone Encounter (Signed)
Completed.

## 2022-09-13 NOTE — Telephone Encounter (Signed)
Patient needs a refill on Furosemide 20mg . She has none left and, no refills.

## 2022-09-16 IMAGING — DX DG CHEST 1V PORT
1 series · 1 of 1 positions shown · non-contrast
Comparison: October 11, 2014

CLINICAL DATA: Chest pain.

EXAM:
PORTABLE CHEST 1 VIEW

[chest ap]
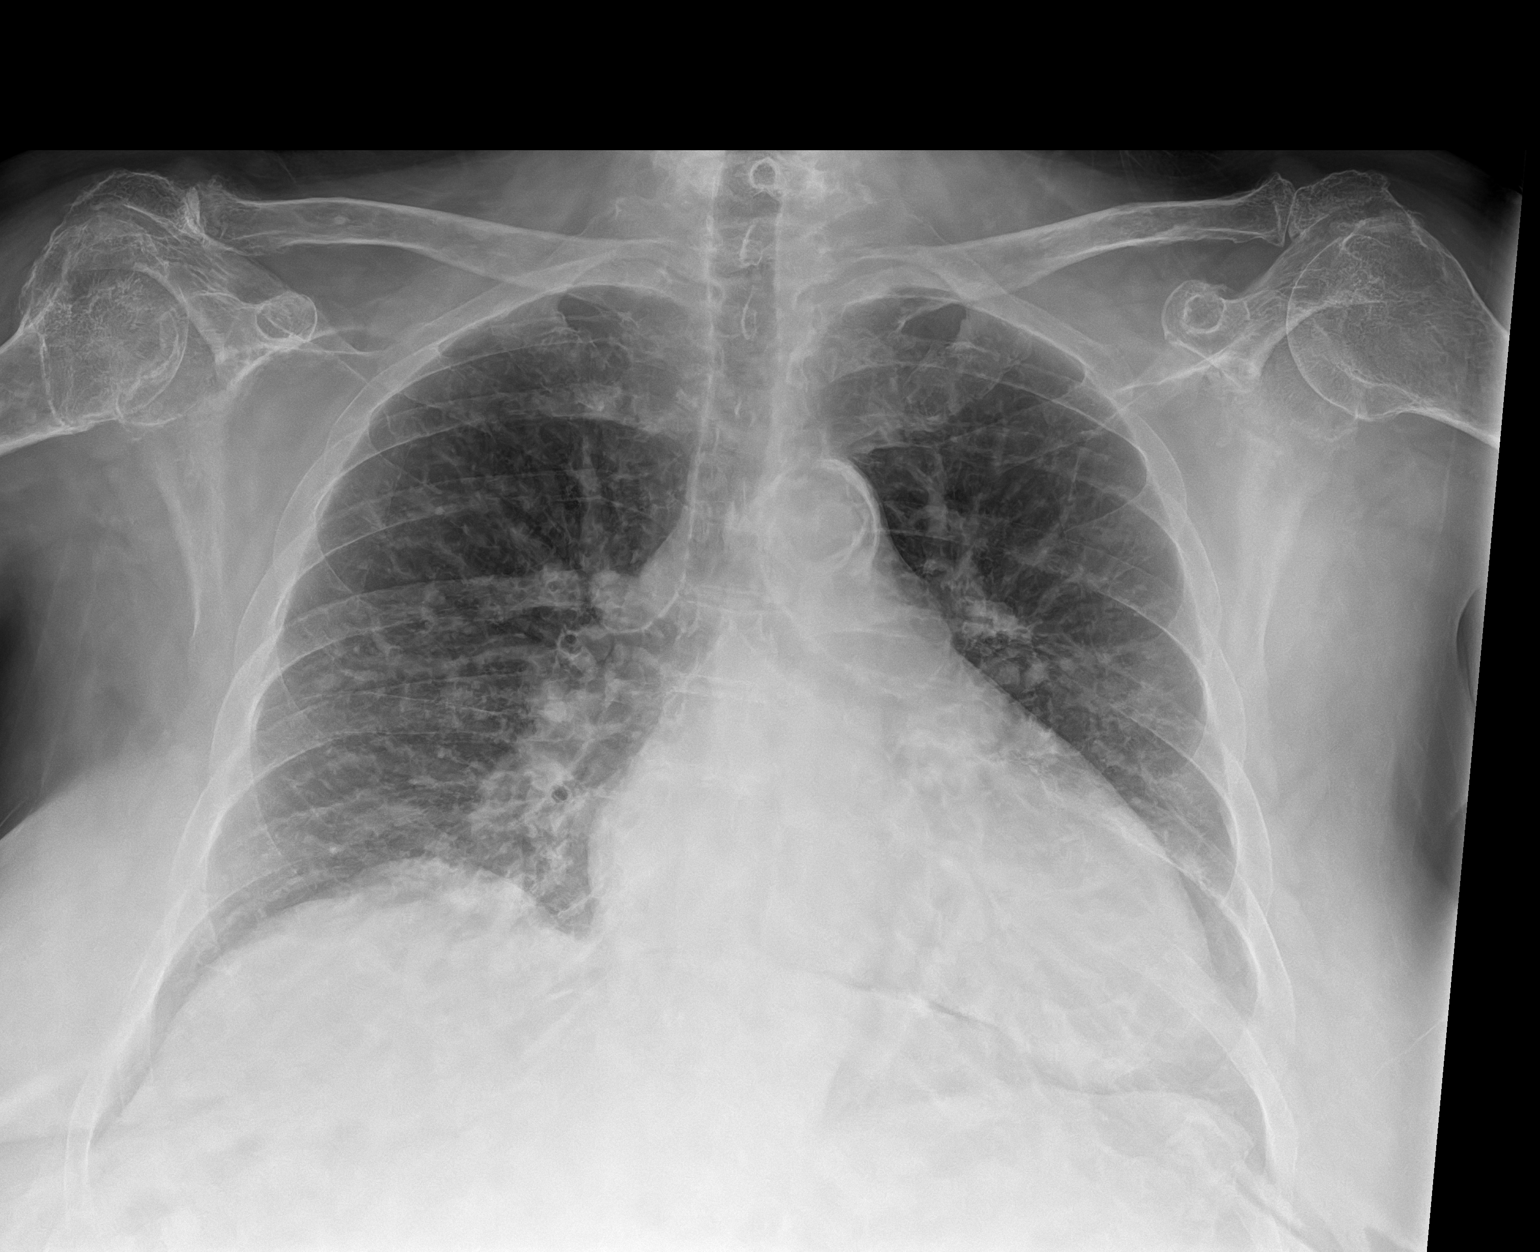

[1 of 1 positions shown; findings below may reference images not displayed]

FINDINGS: Mild to moderate severity diffuse chronic appearing increased
interstitial lung markings are noted. There is no evidence of focal
consolidation, pleural effusion or pneumothorax. The cardiac
silhouette is moderately enlarged. There is marked severity
calcification of the aortic arch. A chronic deformity is seen
involving the right humeral head.
IMPRESSION: Chronic appearing increased interstitial lung markings without
evidence of acute or active cardiopulmonary disease.

## 2022-09-16 IMAGING — DX DG CHEST 1V PORT
1 series · 1 of 1 positions shown · non-contrast
Comparison: 07/14/2020

CLINICAL DATA: OG and endotracheal tube placements

EXAM:
PORTABLE CHEST 1 VIEW

[chest]
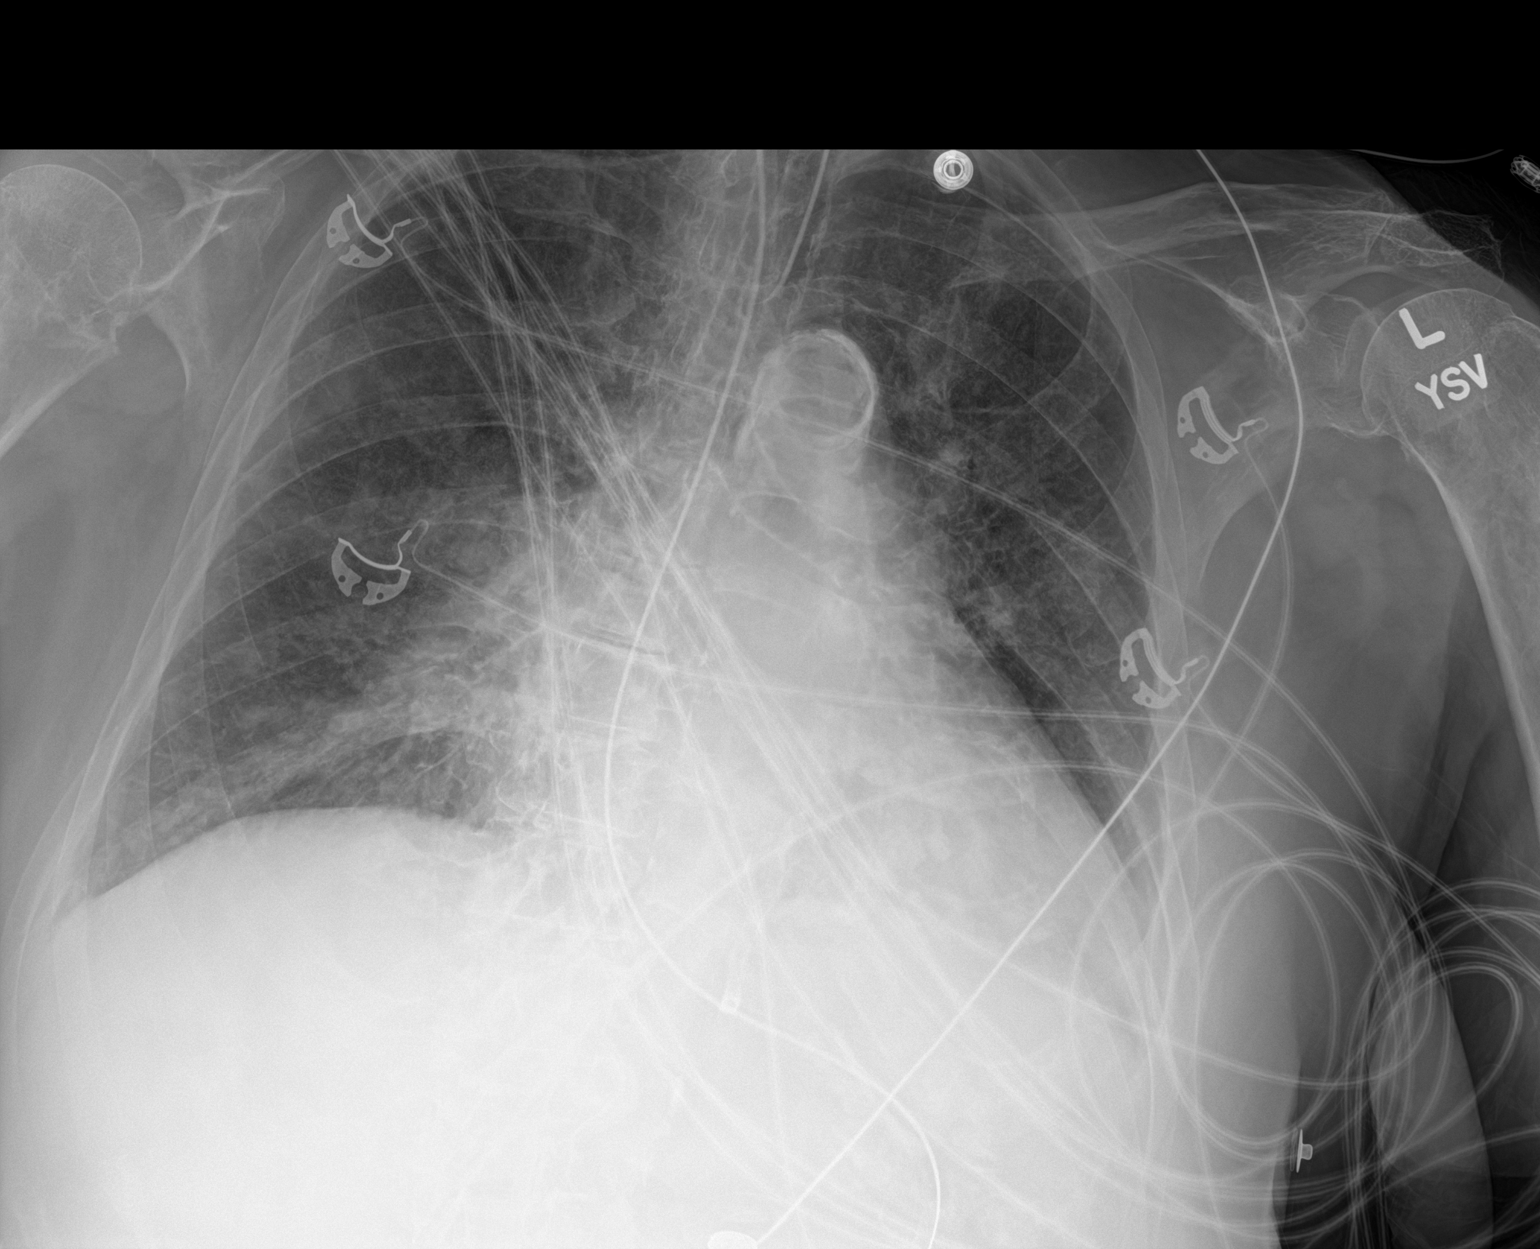

[1 of 1 positions shown; findings below may reference images not displayed]

FINDINGS: Endotracheal tube placed with tip measuring 4.8 cm above the carina.
Enteric tube tip is off the field of view but below the left
hemidiaphragm. Cardiac enlargement. Perihilar infiltration or edema.
Right upper lung consolidation is improving since prior study. No
pleural effusions. No pneumothorax. Calcified and tortuous aorta.
IMPRESSION: Appliances appear in satisfactory position. Cardiac enlargement with
perihilar infiltration or edema. Improving consolidation in the
right upper lung.

## 2022-09-16 IMAGING — DX DG ABD PORTABLE 1V
1 series · 2 of 2 positions shown · non-contrast
Comparison: None.

CLINICAL DATA: OG tube placement

EXAM:
PORTABLE ABDOMEN - 1 VIEW

[Series 1: abdomen · 0.14mm/px · 2 of 2 slices shown]
[im 1/2]
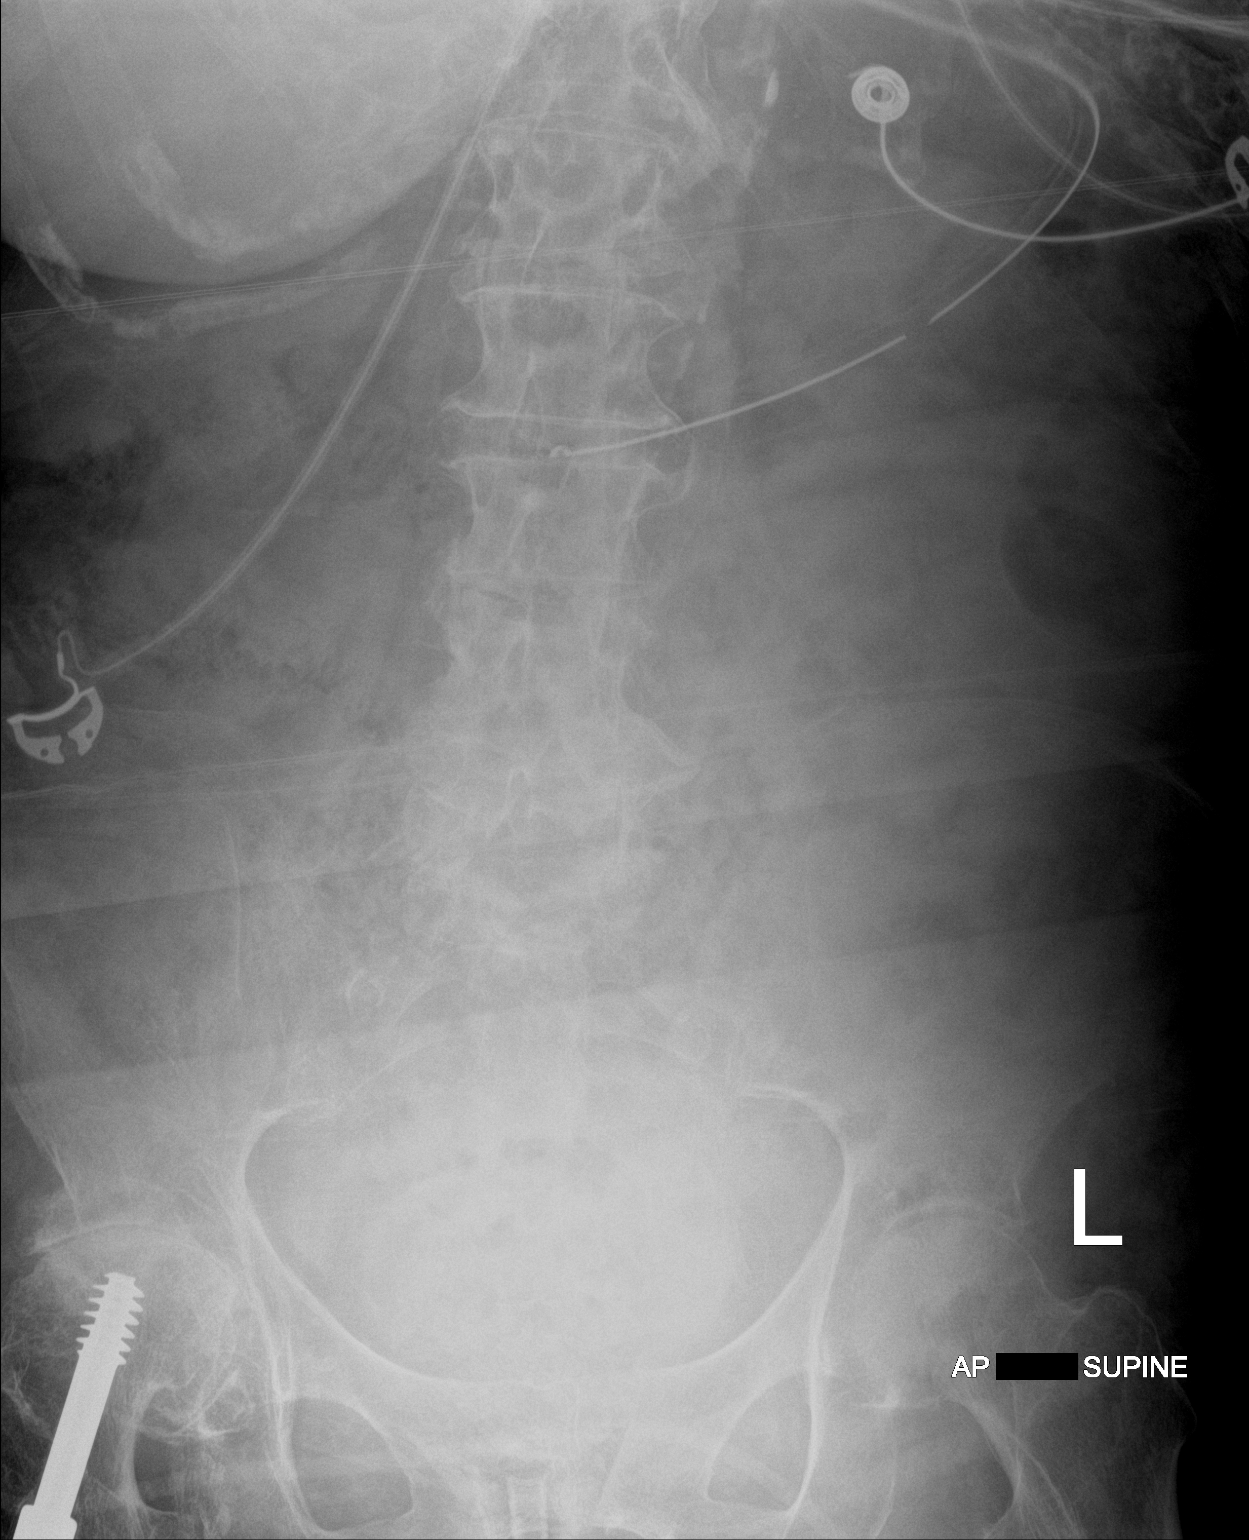
[im 2/2]
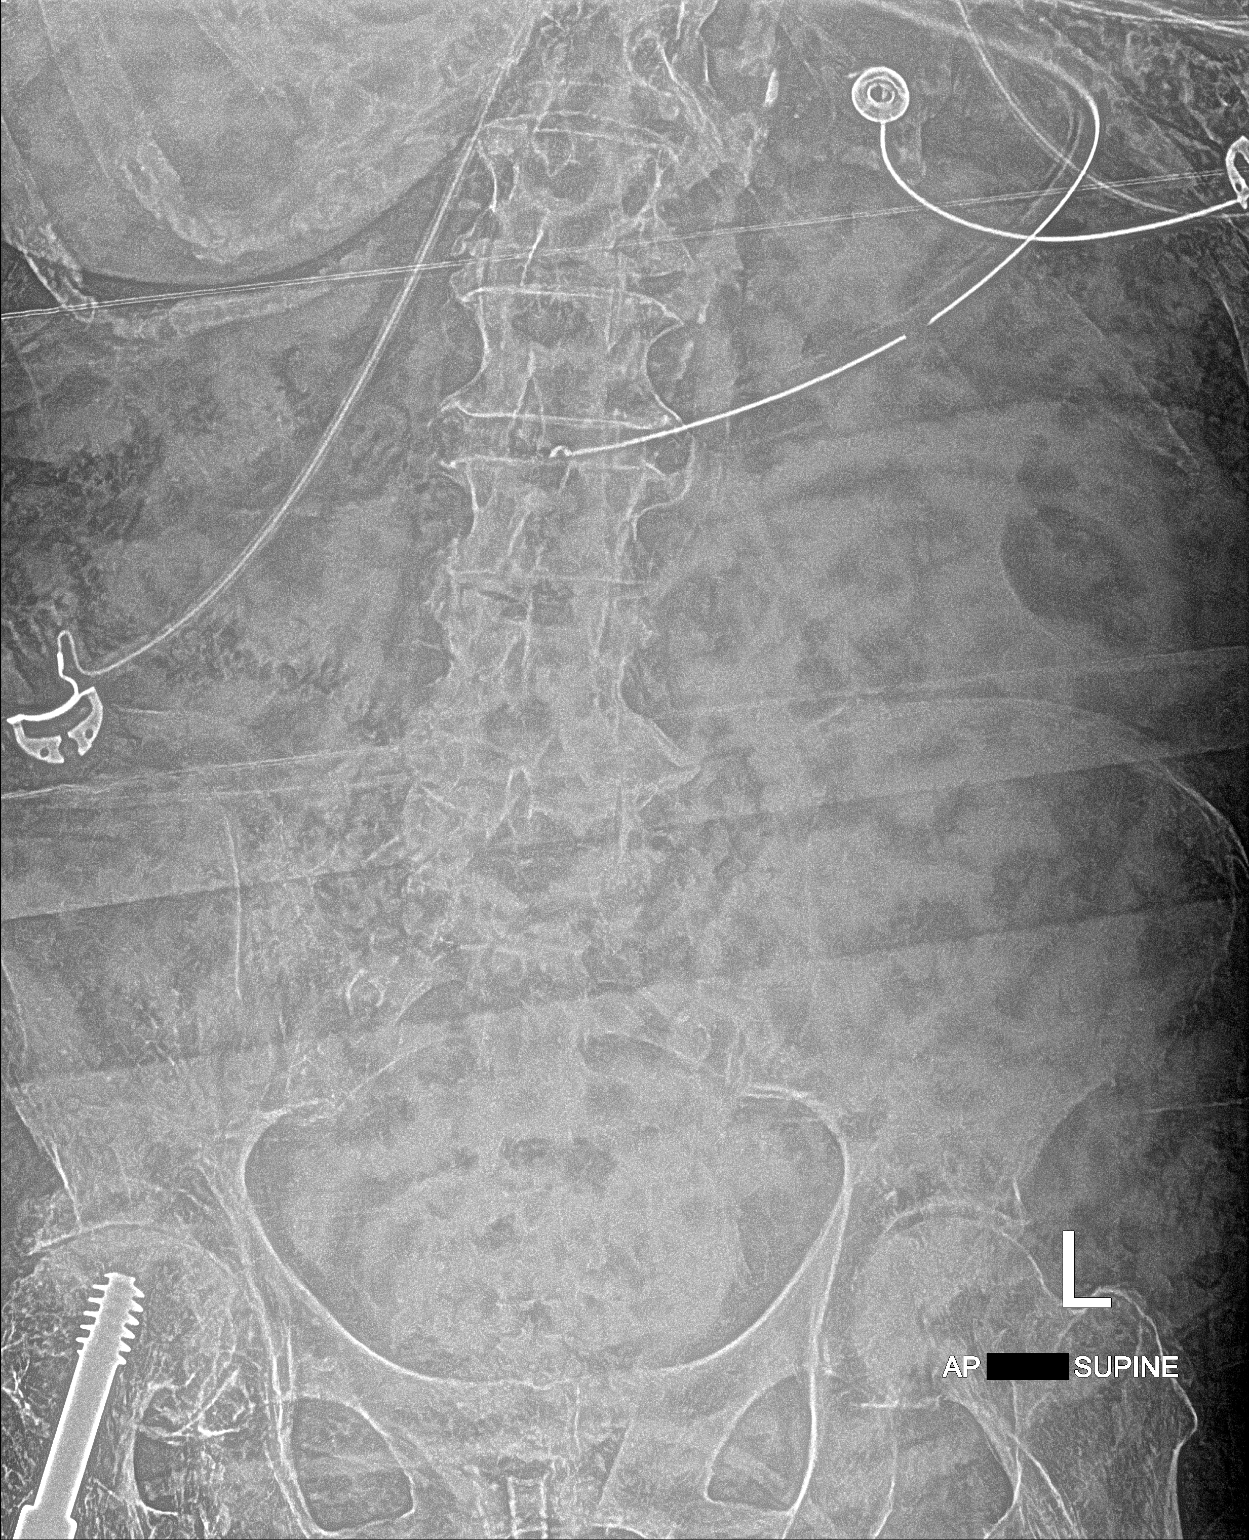

[2 of 2 positions shown; findings below may reference images not displayed]

FINDINGS: Enteric tube tip is in the upper abdomen consistent with location in
the distal stomach. Gas and stool throughout the colon. No small or
large bowel distention. Degenerative changes in the spine and hips.
Postoperative change in the right hip.
IMPRESSION: Enteric tube tip in the upper abdomen consistent with location in
the distal stomach.

## 2022-09-16 IMAGING — DX DG CHEST 1V PORT
1 series · 1 of 1 positions shown · non-contrast
Comparison: 07/14/2020

CLINICAL DATA: Shortness of breath. Chest pain and numbness to the
left fingers. Swelling in the left foot and ankle

EXAM:
PORTABLE CHEST 1 VIEW

[chest ap]
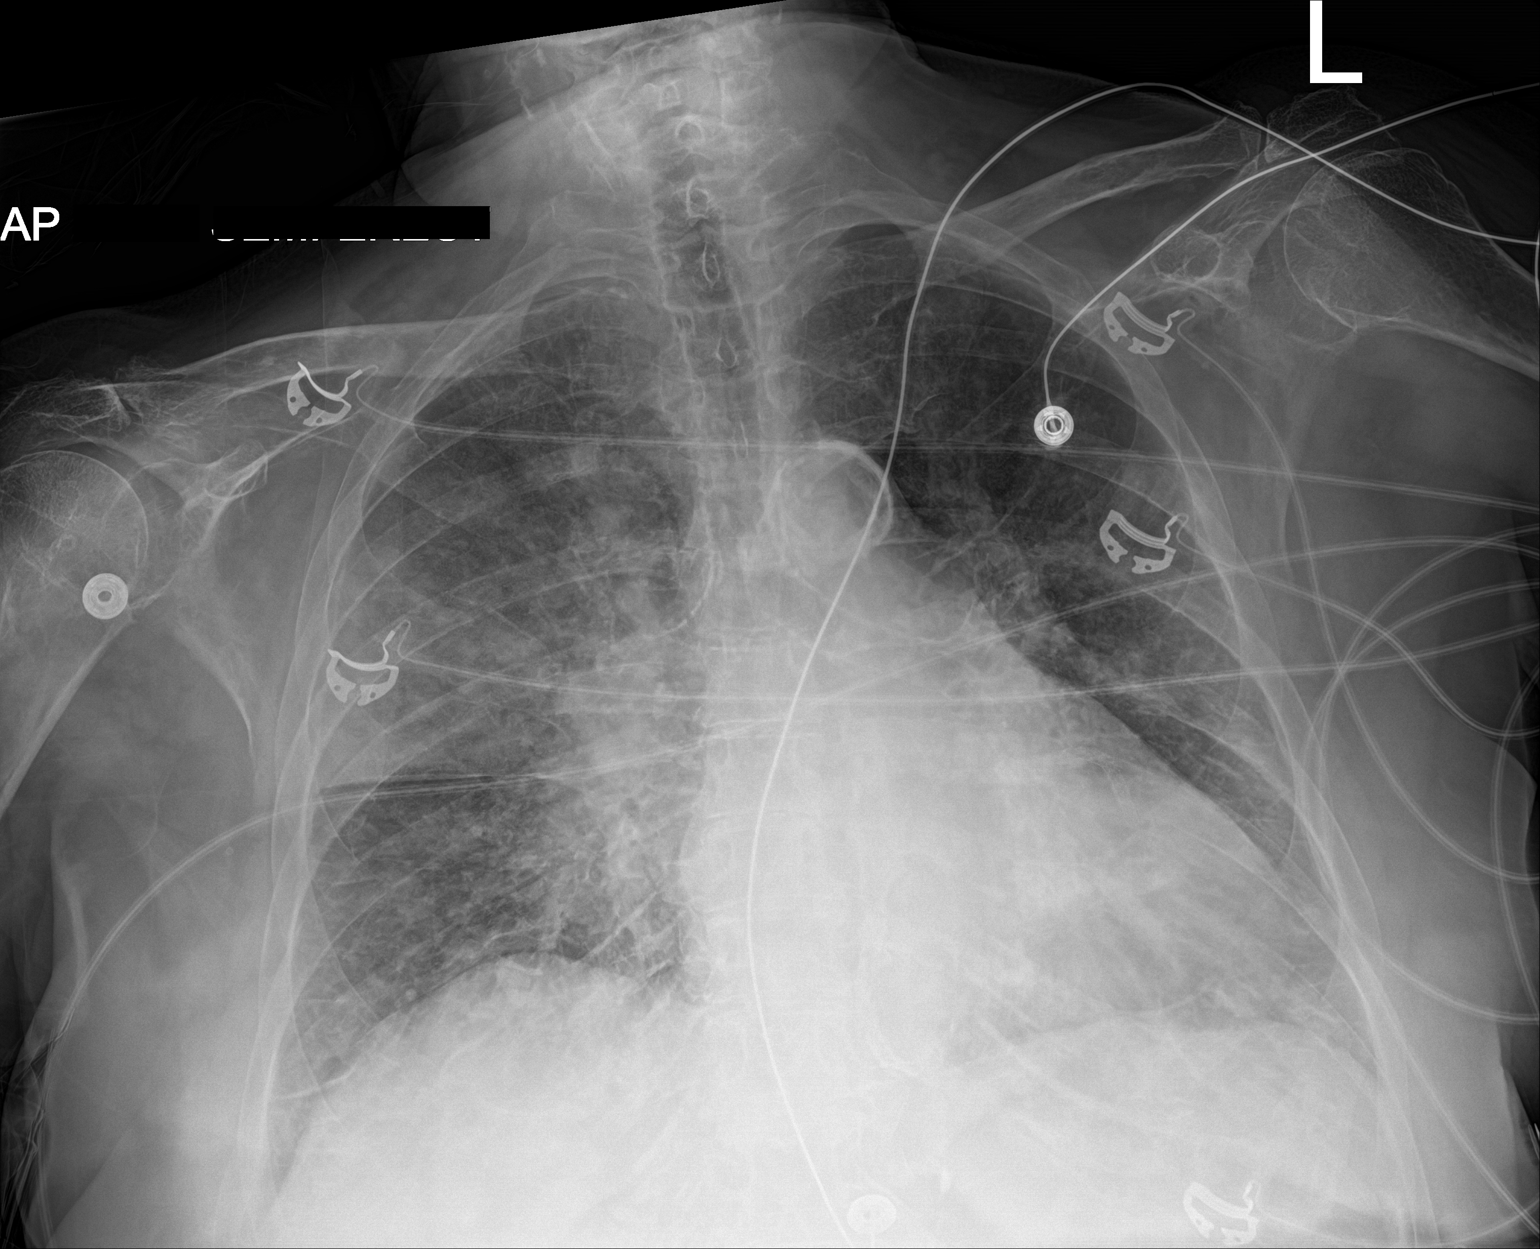

[1 of 1 positions shown; findings below may reference images not displayed]

FINDINGS: Shallow inspiration. Cardiac enlargement. Infiltration and
consolidation in the right upper lung likely representing pneumonia.
Perihilar infiltrates and interstitial changes may represent edema
or multifocal pneumonia. No pleural effusions. No pneumothorax.
Calcification of the aorta. Degenerative changes in the spine and
shoulders.
IMPRESSION: Cardiac enlargement with bilateral perihilar infiltrates and
consolidation in the right upper lung. There is progression since
the previous study from earlier today.

## 2022-09-18 IMAGING — DX DG CHEST 1V PORT
1 series · 1 of 1 positions shown · non-contrast
Comparison: Chest radiograph from one day prior.

CLINICAL DATA: Hypoxemia

EXAM:
PORTABLE CHEST 1 VIEW

[chest]
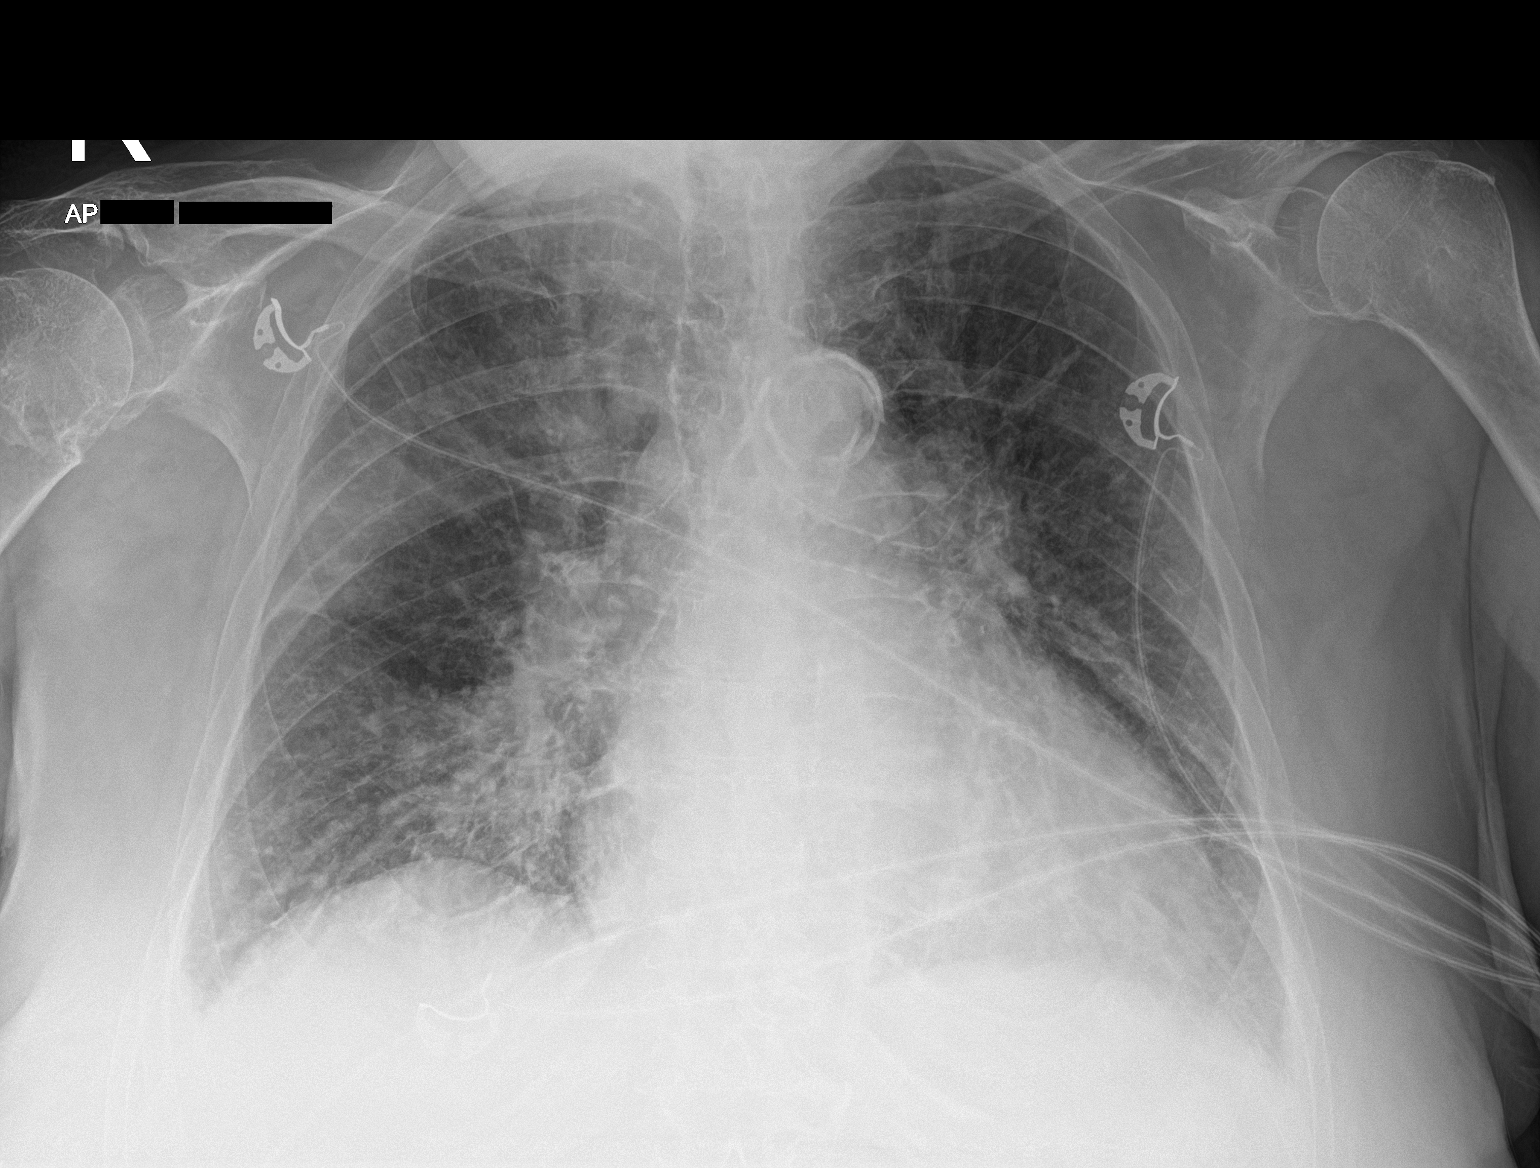

[1 of 1 positions shown; findings below may reference images not displayed]

FINDINGS: Stable cardiomediastinal silhouette with mild cardiomegaly. No
pneumothorax. Small bilateral pleural effusions, slightly increased.
Mild diffuse prominence of the parahilar interstitial markings and
increased hazy opacity in upper right lung.
IMPRESSION: Stable mild cardiomegaly. Mild diffuse prominence of the parahilar
interstitial markings and increased hazy opacity in the upper right
lung, favor worsening asymmetric pulmonary edema. Small bilateral
pleural effusions, slightly increased.

## 2022-10-08 DIAGNOSIS — E113293 Type 2 diabetes mellitus with mild nonproliferative diabetic retinopathy without macular edema, bilateral: Secondary | ICD-10-CM | POA: Diagnosis not present

## 2022-11-04 ENCOUNTER — Ambulatory Visit (HOSPITAL_COMMUNITY)
Admission: RE | Admit: 2022-11-04 | Discharge: 2022-11-04 | Disposition: A | Discharge status: Home / Self Care | Payer: Medicare Other | Source: Ambulatory Visit | Attending: Student | Admitting: Student

## 2022-11-04 DIAGNOSIS — I350 Nonrheumatic aortic (valve) stenosis: Secondary | ICD-10-CM | POA: Diagnosis not present

## 2022-11-04 LAB — ECHOCARDIOGRAM COMPLETE
AR max vel: 1.39 cm2
AV Area VTI: 1.43 cm2
AV Area mean vel: 1.39 cm2
AV Mean grad: 14 mmHg
AV Peak grad: 23.4 mmHg
Ao pk vel: 2.42 m/s
Area-P 1/2: 4.06 cm2
MV VTI: 2.28 cm2
S' Lateral: 3.2 cm

## 2022-11-04 NOTE — Progress Notes (Signed)
*  PRELIMINARY RESULTS* Echocardiogram 2D Echocardiogram has been performed.  [name] 11/04/2022, 2:51 PM

## 2022-11-05 ENCOUNTER — Telehealth: Payer: Self-pay

## 2022-11-05 DIAGNOSIS — Z79899 Other long term (current) drug therapy: Secondary | ICD-10-CM

## 2022-11-05 NOTE — Telephone Encounter (Signed)
I spoke with patient and discussed echo results. She has no c/o CP or SOB.She said she feels "just fine". She has an appointment with Dr.McDowelll on 1/29 and would like to make one trip and have her lab work done when she is at the office.

## 2022-11-05 NOTE — Telephone Encounter (Signed)
-----  Message from Charlie Pitter, PA-C sent at 11/05/2022  7:33 AM EST ----- Covering B. Strader's inbox. Patient had repeat echo ordered to follow LV dysfunction. Please let patient know pump function shows improved function. There is mild leaking of aortic valve. Of note, there is a moderate fluid collection around her heart which has increased from prior. Please find out if she is having any new symptoms. If she is having any new CP or SOB, this would be an indication to proceed to ER for closer evaluation, but if asymptomatic would bring in for outpatient CBC and BMET to ensure stable and otherwise keep follow-up with Dr. Domenic Polite as planned. Please let me know her reply.

## 2022-11-05 NOTE — Telephone Encounter (Addendum)
Will route to Dr. Domenic Polite so he is aware - covering B. Strader's inbox, echo showing new moderate pericardial effusion, no evidence of tamponade; EF improved from prior. If she wishes to wait on labs, would otherwise hold off on lab orders pending that appointment so Dr. Domenic Polite can decide if additional testing needs to be done so that she can get everything drawn at once.

## 2022-11-05 NOTE — Telephone Encounter (Signed)
Lab orders cancelled

## 2022-11-11 ENCOUNTER — Encounter: Payer: Self-pay | Admitting: Cardiology

## 2022-11-11 ENCOUNTER — Ambulatory Visit: Payer: Medicare Other | Attending: Cardiology | Admitting: Cardiology

## 2022-11-11 VITALS — BP 124/82 | HR 90 | Ht 65.0 in | Wt 179.2 lb

## 2022-11-11 DIAGNOSIS — I429 Cardiomyopathy, unspecified: Secondary | ICD-10-CM | POA: Insufficient documentation

## 2022-11-11 DIAGNOSIS — I25119 Atherosclerotic heart disease of native coronary artery with unspecified angina pectoris: Secondary | ICD-10-CM | POA: Insufficient documentation

## 2022-11-11 DIAGNOSIS — Z86711 Personal history of pulmonary embolism: Secondary | ICD-10-CM | POA: Insufficient documentation

## 2022-11-11 DIAGNOSIS — Z8679 Personal history of other diseases of the circulatory system: Secondary | ICD-10-CM | POA: Diagnosis not present

## 2022-11-11 DIAGNOSIS — I484 Atypical atrial flutter: Secondary | ICD-10-CM | POA: Diagnosis not present

## 2022-11-11 DIAGNOSIS — I3139 Other pericardial effusion (noninflammatory): Secondary | ICD-10-CM | POA: Diagnosis not present

## 2022-11-11 NOTE — Patient Instructions (Addendum)
Medication Instructions:  Your physician recommends that you continue on your current medications as directed. Please refer to the Current Medication list given to you today.   Labwork: None today  Testing/Procedures: Echo cardiogram in 6 months  Follow-Up: After Echocardiogram in 6 months with Dr.McDowell   See Edrick Oh, RN,  in [facility] at the [place] office  Any Other Special Instructions Will Be Listed Below (If Applicable).  If you need a refill on your cardiac medications before your next appointment, please call your pharmacy.

## 2022-11-11 NOTE — Progress Notes (Signed)
Cardiology Office Note  Date: 11/11/2022   ID: [name], DOB [birth year], MRN [id number]  PCP:  Celene Squibb, MD  Cardiologist:  Rozann Lesches, MD Electrophysiologist:  None   Chief Complaint  Patient presents with   Cardiac follow-up    History of Present Illness: [name] is an 82 y.o. female last seen in July 2023 by Ms. Strader PA-C, I reviewed the note.  She is here today with her daughter for a follow-up visit.  Overall no reported sense of palpitations, no chest pain or syncope.  She is still exercising regularly at the [facility].  Recent echocardiogram revealed LVEF 60 to 65%, severe left atrial enlargement, mild aortic stenosis with mean AV gradient 14 mmHg, and what was described as a moderate-sized pericardial effusion.  I reviewed the images myself, the pericardial effusion is actually small overall, small to moderate collection posteriorly.  We discussed these results today.  She remains on Coumadin with follow-up by PCP, although does not sound like PT/INR checks have been very regular.  They asked about reestablishing in our anticoagulation clinic.  I reviewed her medications.  Heart rate today is in the 90s after being seated for 10 minutes.  She has been in persistent atypical atrial flutter with variable block which we are managing with heart rate control strategy.  She does not report any unusual weight gain, no orthopnea or PND.  Continues on Lasix without potassium supplement.  Past Medical History:  Diagnosis Date   Arthritis    Atrial flutter ([place])    a. 06/2013 s/p RFCA.   Bilateral pulmonary embolism ([place]) 08/2008   a. Chronic coumadin.   Carotid arterial disease ([place])    a. 03/2017 Carotid angio: RICA 16-60%, LICA 63-01%.   CHF (congestive heart failure) (HCC)    CKD (chronic kidney disease), stage III (HCC)    Coronary artery disease    a. 05/2005 Inf STEMI/PCI: severe LAD/LCX/RCA dzs->CABG advised, pt preferred PCI-->Cypher DES to  mRCA & mLAD; b. 02/2008 PCI: ISR of RCA->Cypher DES. LAD patent; c. 07/2020 Inf STEMI/PCI: LM 30d, LAD 55m, D1 80(small), RI 75, LCX 80ost->distal (small), RCA 25p/m, 152m/d (3.0x26 Resolute Onyx DES).   Essential hypertension    HFrEF (heart failure with reduced ejection fraction) ([place])    a. 07/2020 Echo: EF 45-50% in setting of inf STEMI; b. 01/2021 Echo: EF 40-45%, glob HK, sev basal-septal LVH w/ grII DD. Nl RV fxn, RVSP 37.90mmHg, sev dil LA, mild MR, mod AS.   Hyperlipidemia    Ischemic cardiomyopathy    a. 07/2020 Echo: EF 45-50%; b. 01/2021 Echo: EF 40-45%.   Memory disorder 07/08/2017   Moderate aortic stenosis    a. 01/2021 Echo: Mild-mod AS w/ mean grad 31mmHg. AoV area 2.62m/s (Vmax). In setting of LV dysfxn, AS likely more moderate.   Myocardial infarction [facility])    Stroke ([place])    Type 2 diabetes mellitus ([place])    Vascular dementia ([place])     Current Outpatient Medications  Medication Sig Dispense Refill   albuterol (VENTOLIN HFA) 108 (90 Base) MCG/ACT inhaler Inhale 2 puffs into the lungs every 6 (six) hours as needed for wheezing or shortness of breath. 8 g 2   ALPRAZolam (XANAX) 0.25 MG tablet Take 0.125-0.25 mg by mouth See admin instructions. 0.125 in the morning. 0.25 mg at bedtime     Ascorbic Acid (VITAMIN C GUMMIES PO) Take 2 each by mouth daily. 180 mg     atorvastatin (LIPITOR) 40  MG tablet Take 1 tablet (40 mg total) by mouth daily. 30 tablet 1   b complex vitamins capsule Take 1 capsule by mouth daily.     carvedilol (COREG) 12.5 MG tablet Take 1 tablet (12.5 mg total) by mouth 2 (two) times daily. 180 tablet 3   cholecalciferol (VITAMIN D3) 25 MCG (1000 UNIT) tablet Take 1,000 Units by mouth daily.     clopidogrel (PLAVIX) 75 MG tablet Take 1 tablet by mouth once daily with breakfast 90 tablet 3   Coenzyme Q10 (CO Q 10) 100 MG CAPS Take 100 mg by mouth daily.     furosemide (LASIX) 20 MG tablet Take 1 tablet (20 mg total) by mouth 2 (two) times daily. 180 tablet 2    glipiZIDE (GLUCOTROL XL) 5 MG 24 hr tablet Take 5 mg by mouth in the morning and at bedtime.     isosorbide mononitrate (IMDUR) 30 MG 24 hr tablet Take 1 tablet by mouth once daily 90 tablet 3   MEGARED OMEGA-3 KRILL OIL PO Take 1,000 mg by mouth daily. When she remembers     nitroGLYCERIN (NITROSTAT) 0.4 MG SL tablet Place 1 tablet (0.4 mg total) under the tongue every 5 (five) minutes x 3 doses as needed for chest pain. 25 tablet 3   Polyethyl Glycol-Propyl Glycol (SYSTANE OP) Apply 1 drop to eye 3 (three) times daily as needed (dry eyes).     vitamin E 180 MG (400 UNITS) capsule Take 400 Units by mouth daily.     warfarin (COUMADIN) 5 MG tablet Take 5 mg by mouth every evening.     No current facility-administered medications for this visit.   Allergies:  Patient has no known allergies.   ROS: No leg edema.  Physical Exam: VS:  BP 124/82   Pulse 90   Ht 5\' 5"  (1.651 m)   Wt 179 lb 3.2 oz (81.3 kg)   SpO2 98%   BMI 29.82 kg/m , BMI Body mass index is 29.82 kg/m.  Wt Readings from Last 3 Encounters:  11/11/22 179 lb 3.2 oz (81.3 kg)  05/03/22 183 lb (83 kg)  01/30/22 175 lb 6.4 oz (79.6 kg)    General: Patient appears comfortable at rest. HEENT: Conjunctiva and lids normal. Neck: Supple, no elevated JVP. Lungs: Clear to auscultation, nonlabored breathing at rest. Cardiac: Irregularly irregular, 2/6 stock murmur without gallop. Extremities: No pitting edema.  ECG:  An ECG dated 05/03/2022 was personally reviewed today and demonstrated:  Atypical atrial flutter with 4:1 block, low voltage.  Recent Labwork: July 2023: Hemoglobin A1c 8.8%, cholesterol 137, triglycerides 88, HDL 45, LDL 75, AST 27, ALT 43, potassium 4.4, creatinine 1.85, hemoglobin 12.0 07/31/2022: Creatinine, Ser 2.10   Other Studies Reviewed Today:  Echocardiogram 11/04/2022:  1. Left ventricular ejection fraction, by estimation, is 60 to 65%. The  left ventricle has normal function. The left ventricle has  no regional  wall motion abnormalities. There is mild left ventricular hypertrophy.  Left ventricular diastolic parameters  are indeterminate.   2. Right ventricular systolic function is normal. The right ventricular  size is normal. There is normal pulmonary artery systolic pressure.   3. Left atrial size was severely dilated.   4. Moderate pericardial effusion. The pericardial effusion is  circumferential. There is no evidence of cardiac tamponade.   5. The mitral valve is normal in structure. No evidence of mitral valve  regurgitation. No evidence of mitral stenosis. Severe mitral annular  calcification.   6. The tricuspid  valve is abnormal.   7. The aortic valve is tricuspid. There is severe calcifcation of the  aortic valve. There is severe thickening of the aortic valve. Aortic valve  regurgitation is not visualized. Mild aortic valve stenosis. Aortic valve  mean gradient measures 14.0 mmHg.  Aortic valve peak gradient measures 23.4 mmHg. Aortic valve area, by VTI  measures 1.43 cm.   8. The inferior vena cava is normal in size with greater than 50%  respiratory variability, suggesting right atrial pressure of 3 mmHg.   Assessment and Plan:  1.  Persistent/permanent atypical atrial flutter with plan for heart rate control strategy.  CHA2DS2-VASc score is 8.  She is on Coumadin with concurrent diagnosis of prior DVT and plan for long-term anticoagulation.  Plan to reestablish her in our anticoagulation clinic.  Continue Coreg at current dose for heart rate control.  2.  HFrecEF with ischemic cardiomyopathy, LVEF improved to the range of 60 to 65% by most recent echocardiogram.  Continue Lasix and Coreg.  Blood pressure and CKD with propensity to hyperkalemia limited GDMT in terms of MRA and Entresto.  3.  Aortic stenosis, mild by most recent echocardiogram with mean AV gradient 14 mmHg.  Continue to track over time.  4.  Pericardial effusion, overall small with moderate collection  posteriorly.  Will follow-up echocardiogram for her next visit.  5.  CKD stage IIIb, recent creatinine 2.1.  6.  CAD with history of DES to mid-RCA and DES to mid-LAD in 2006, DES to ISR of RCA in 2009, inferior STEMI in 07/2020 with ISR of RCA and treated with DES and NSTEMI in 01/2021 with cath showing patent stents with no culprit lesions.  Continue regular echo size plan through the [facility].  Medication Adjustments/Labs and Tests Ordered: Current medicines are reviewed at length with the patient today.  Concerns regarding medicines are outlined above.   Tests Ordered: Orders Placed This Encounter  Procedures   ECHOCARDIOGRAM COMPLETE    Medication Changes: No orders of the defined types were placed in this encounter.   Disposition:  Follow up  6 months.  Signed, Satira Sark, MD, [facility] 11/11/2022 2:10 PM    [place] at [facility] [address] [place], [place] [address] Phone: [phone]; Fax: [phone]

## 2022-11-12 ENCOUNTER — Ambulatory Visit: Payer: Medicare Other | Attending: Internal Medicine | Admitting: *Deleted

## 2022-11-12 DIAGNOSIS — Z86711 Personal history of pulmonary embolism: Secondary | ICD-10-CM | POA: Insufficient documentation

## 2022-11-12 DIAGNOSIS — I484 Atypical atrial flutter: Secondary | ICD-10-CM

## 2022-11-12 DIAGNOSIS — Z5181 Encounter for therapeutic drug level monitoring: Secondary | ICD-10-CM | POA: Insufficient documentation

## 2022-11-12 LAB — POCT INR: INR: 2.7 (ref 2.0–3.0)

## 2022-11-12 NOTE — Patient Instructions (Signed)
Continue warfarin 1 tablet daily °Recheck in 6 wks °

## 2022-12-05 DIAGNOSIS — N184 Chronic kidney disease, stage 4 (severe): Secondary | ICD-10-CM | POA: Diagnosis not present

## 2022-12-05 DIAGNOSIS — E1165 Type 2 diabetes mellitus with hyperglycemia: Secondary | ICD-10-CM | POA: Diagnosis not present

## 2022-12-25 DIAGNOSIS — Z86711 Personal history of pulmonary embolism: Secondary | ICD-10-CM | POA: Diagnosis not present

## 2022-12-25 DIAGNOSIS — I110 Hypertensive heart disease with heart failure: Secondary | ICD-10-CM | POA: Diagnosis not present

## 2022-12-25 DIAGNOSIS — N184 Chronic kidney disease, stage 4 (severe): Secondary | ICD-10-CM | POA: Diagnosis not present

## 2022-12-25 DIAGNOSIS — I509 Heart failure, unspecified: Secondary | ICD-10-CM | POA: Diagnosis not present

## 2022-12-25 DIAGNOSIS — E1122 Type 2 diabetes mellitus with diabetic chronic kidney disease: Secondary | ICD-10-CM | POA: Diagnosis not present

## 2022-12-25 DIAGNOSIS — J069 Acute upper respiratory infection, unspecified: Secondary | ICD-10-CM | POA: Diagnosis not present

## 2022-12-25 DIAGNOSIS — R809 Proteinuria, unspecified: Secondary | ICD-10-CM | POA: Diagnosis not present

## 2022-12-25 DIAGNOSIS — E782 Mixed hyperlipidemia: Secondary | ICD-10-CM | POA: Diagnosis not present

## 2022-12-25 DIAGNOSIS — D509 Iron deficiency anemia, unspecified: Secondary | ICD-10-CM | POA: Diagnosis not present

## 2022-12-25 DIAGNOSIS — I482 Chronic atrial fibrillation, unspecified: Secondary | ICD-10-CM | POA: Diagnosis not present

## 2022-12-25 DIAGNOSIS — E1165 Type 2 diabetes mellitus with hyperglycemia: Secondary | ICD-10-CM | POA: Diagnosis not present

## 2022-12-25 DIAGNOSIS — N189 Chronic kidney disease, unspecified: Secondary | ICD-10-CM | POA: Diagnosis not present

## 2022-12-25 LAB — POCT INR: INR: 3.6 — AB (ref 2.0–3.0)

## 2022-12-26 ENCOUNTER — Ambulatory Visit (INDEPENDENT_AMBULATORY_CARE_PROVIDER_SITE_OTHER): Payer: Medicare Other | Admitting: *Deleted

## 2022-12-26 DIAGNOSIS — Z5181 Encounter for therapeutic drug level monitoring: Secondary | ICD-10-CM

## 2022-12-26 DIAGNOSIS — I82401 Acute embolism and thrombosis of unspecified deep veins of right lower extremity: Secondary | ICD-10-CM | POA: Diagnosis not present

## 2022-12-26 NOTE — Patient Instructions (Signed)
Spoke with daughter [name]. Pt had INR checked at PCP office yesterday.  INR 3.6.  Pt took regular dose of warfarin last night. Pt to hold warfarin tonight then resume 1 tablet daily Recheck in 3 wks

## 2022-12-30 DIAGNOSIS — B9689 Other specified bacterial agents as the cause of diseases classified elsewhere: Secondary | ICD-10-CM | POA: Diagnosis not present

## 2022-12-30 DIAGNOSIS — J208 Acute bronchitis due to other specified organisms: Secondary | ICD-10-CM | POA: Diagnosis not present

## 2023-01-03 DIAGNOSIS — R062 Wheezing: Secondary | ICD-10-CM | POA: Diagnosis not present

## 2023-01-03 DIAGNOSIS — N1831 Chronic kidney disease, stage 3a: Secondary | ICD-10-CM | POA: Diagnosis not present

## 2023-01-03 DIAGNOSIS — E1165 Type 2 diabetes mellitus with hyperglycemia: Secondary | ICD-10-CM | POA: Diagnosis not present

## 2023-01-03 DIAGNOSIS — I25119 Atherosclerotic heart disease of native coronary artery with unspecified angina pectoris: Secondary | ICD-10-CM | POA: Diagnosis not present

## 2023-01-03 DIAGNOSIS — Z1330 Encounter for screening examination for mental health and behavioral disorders, unspecified: Secondary | ICD-10-CM | POA: Diagnosis not present

## 2023-01-03 DIAGNOSIS — I483 Typical atrial flutter: Secondary | ICD-10-CM | POA: Diagnosis not present

## 2023-01-03 DIAGNOSIS — J189 Pneumonia, unspecified organism: Secondary | ICD-10-CM | POA: Diagnosis not present

## 2023-01-03 DIAGNOSIS — I5042 Chronic combined systolic (congestive) and diastolic (congestive) heart failure: Secondary | ICD-10-CM | POA: Diagnosis not present

## 2023-02-05 ENCOUNTER — Other Ambulatory Visit: Payer: Self-pay | Admitting: Student

## 2023-03-19 ENCOUNTER — Other Ambulatory Visit (HOSPITAL_COMMUNITY): Payer: Self-pay | Admitting: Adult Health Nurse Practitioner

## 2023-03-19 ENCOUNTER — Ambulatory Visit (HOSPITAL_COMMUNITY)
Admission: RE | Admit: 2023-03-19 | Discharge: 2023-03-19 | Disposition: A | Discharge status: Home / Self Care | Payer: Medicare Other | Source: Ambulatory Visit | Attending: Adult Health Nurse Practitioner | Admitting: Adult Health Nurse Practitioner

## 2023-03-19 DIAGNOSIS — R2232 Localized swelling, mass and lump, left upper limb: Secondary | ICD-10-CM

## 2023-03-19 DIAGNOSIS — I82602 Acute embolism and thrombosis of unspecified veins of left upper extremity: Secondary | ICD-10-CM

## 2023-03-19 DIAGNOSIS — Z5181 Encounter for therapeutic drug level monitoring: Secondary | ICD-10-CM | POA: Diagnosis not present

## 2023-03-19 DIAGNOSIS — S40022A Contusion of left upper arm, initial encounter: Secondary | ICD-10-CM | POA: Diagnosis not present

## 2023-03-19 DIAGNOSIS — Z7901 Long term (current) use of anticoagulants: Secondary | ICD-10-CM | POA: Diagnosis not present

## 2023-03-19 DIAGNOSIS — T148XXA Other injury of unspecified body region, initial encounter: Secondary | ICD-10-CM

## 2023-03-19 DIAGNOSIS — I483 Typical atrial flutter: Secondary | ICD-10-CM | POA: Diagnosis not present

## 2023-04-02 ENCOUNTER — Ambulatory Visit: Payer: Medicare Other | Attending: Internal Medicine | Admitting: *Deleted

## 2023-04-02 DIAGNOSIS — Z86711 Personal history of pulmonary embolism: Secondary | ICD-10-CM | POA: Insufficient documentation

## 2023-04-02 DIAGNOSIS — I484 Atypical atrial flutter: Secondary | ICD-10-CM | POA: Diagnosis not present

## 2023-04-02 DIAGNOSIS — Z5181 Encounter for therapeutic drug level monitoring: Secondary | ICD-10-CM | POA: Diagnosis not present

## 2023-04-02 LAB — POCT INR: INR: 3.1 — AB (ref 2.0–3.0)

## 2023-04-02 NOTE — Patient Instructions (Signed)
Decrease warfarin to 1 tablet daily except 1/2 tablet on Wednesdays Recheck in 3 wks

## 2023-04-04 ENCOUNTER — Other Ambulatory Visit (HOSPITAL_COMMUNITY): Payer: Self-pay | Admitting: Adult Health Nurse Practitioner

## 2023-04-04 DIAGNOSIS — R600 Localized edema: Secondary | ICD-10-CM | POA: Diagnosis not present

## 2023-04-04 DIAGNOSIS — Z79899 Other long term (current) drug therapy: Secondary | ICD-10-CM | POA: Diagnosis not present

## 2023-04-04 DIAGNOSIS — N184 Chronic kidney disease, stage 4 (severe): Secondary | ICD-10-CM | POA: Diagnosis not present

## 2023-04-04 DIAGNOSIS — M8589 Other specified disorders of bone density and structure, multiple sites: Secondary | ICD-10-CM

## 2023-04-04 DIAGNOSIS — E1165 Type 2 diabetes mellitus with hyperglycemia: Secondary | ICD-10-CM | POA: Diagnosis not present

## 2023-04-04 DIAGNOSIS — N186 End stage renal disease: Secondary | ICD-10-CM | POA: Diagnosis not present

## 2023-04-04 DIAGNOSIS — E1122 Type 2 diabetes mellitus with diabetic chronic kidney disease: Secondary | ICD-10-CM | POA: Diagnosis not present

## 2023-04-04 DIAGNOSIS — E782 Mixed hyperlipidemia: Secondary | ICD-10-CM | POA: Diagnosis not present

## 2023-04-04 DIAGNOSIS — Z0000 Encounter for general adult medical examination without abnormal findings: Secondary | ICD-10-CM | POA: Diagnosis not present

## 2023-04-04 DIAGNOSIS — E559 Vitamin D deficiency, unspecified: Secondary | ICD-10-CM | POA: Diagnosis not present

## 2023-04-23 ENCOUNTER — Ambulatory Visit: Payer: Medicare Other | Attending: Cardiology | Admitting: *Deleted

## 2023-04-23 LAB — POCT INR: INR: 4.2 — AB (ref 2.0–3.0)

## 2023-04-23 NOTE — Patient Instructions (Signed)
Hold warfarin tonight then decrease dose to 1/2 tablet daily except 1 tablet on Mondays, Wednesdays and Fridays Recheck in 2 wks 

## 2023-05-01 ENCOUNTER — Other Ambulatory Visit (HOSPITAL_COMMUNITY): Payer: Medicare Other

## 2023-05-07 ENCOUNTER — Ambulatory Visit: Payer: Medicare Other | Attending: Cardiology | Admitting: *Deleted

## 2023-05-07 DIAGNOSIS — Z5181 Encounter for therapeutic drug level monitoring: Secondary | ICD-10-CM | POA: Diagnosis not present

## 2023-05-07 DIAGNOSIS — Z86711 Personal history of pulmonary embolism: Secondary | ICD-10-CM | POA: Insufficient documentation

## 2023-05-07 DIAGNOSIS — I484 Atypical atrial flutter: Secondary | ICD-10-CM | POA: Insufficient documentation

## 2023-05-07 LAB — POCT INR: INR: 1.8 — AB (ref 2.0–3.0)

## 2023-05-07 NOTE — Patient Instructions (Signed)
Increase warfarin to 1 tablet daily except 1/2 tablet on Sundays, Tuesdays and Thursdays Recheck in 3 wks

## 2023-05-13 ENCOUNTER — Ambulatory Visit (HOSPITAL_COMMUNITY)
Admission: RE | Admit: 2023-05-13 | Discharge: 2023-05-13 | Disposition: A | Discharge status: Home / Self Care | Payer: Medicare Other | Source: Ambulatory Visit | Attending: Cardiology | Admitting: Cardiology

## 2023-05-13 DIAGNOSIS — Z86711 Personal history of pulmonary embolism: Secondary | ICD-10-CM | POA: Insufficient documentation

## 2023-05-13 DIAGNOSIS — I484 Atypical atrial flutter: Secondary | ICD-10-CM | POA: Diagnosis not present

## 2023-05-13 LAB — ECHOCARDIOGRAM COMPLETE
AR max vel: 1.44 cm2
AV Area VTI: 1.55 cm2
AV Area mean vel: 1.51 cm2
AV Mean grad: 15 mmHg
AV Peak grad: 23.8 mmHg
Ao pk vel: 2.44 m/s
Area-P 1/2: 3.27 cm2
S' Lateral: 3.6 cm

## 2023-05-13 NOTE — Progress Notes (Signed)
*  PRELIMINARY RESULTS* Echocardiogram 2D Echocardiogram has been performed.  Stacey Drain 05/13/2023, 2:02 PM

## 2023-05-15 ENCOUNTER — Telehealth: Payer: Self-pay | Admitting: Cardiology

## 2023-05-15 NOTE — Telephone Encounter (Signed)
Caitlin Sidle, MD 05/13/2023  8:26 PM EDT     Results reviewed. LVEF remains normal at 55-60%. Aortic stenosis remains mild and pericardial effusion stable. Keep follow-up as scheduled.    Results discussed with daughter.has f/u next week

## 2023-05-15 NOTE — Telephone Encounter (Signed)
Pt's daughter returning call to nurse regarding Echo results. Please advise

## 2023-05-19 ENCOUNTER — Encounter: Payer: Self-pay | Admitting: *Deleted

## 2023-05-20 ENCOUNTER — Encounter: Payer: Self-pay | Admitting: Cardiology

## 2023-05-20 ENCOUNTER — Ambulatory Visit: Payer: Medicare Other | Attending: Cardiology | Admitting: Cardiology

## 2023-05-20 VITALS — BP 120/62 | HR 82 | Ht 65.0 in | Wt 186.0 lb

## 2023-05-20 DIAGNOSIS — I350 Nonrheumatic aortic (valve) stenosis: Secondary | ICD-10-CM | POA: Insufficient documentation

## 2023-05-20 DIAGNOSIS — I25119 Atherosclerotic heart disease of native coronary artery with unspecified angina pectoris: Secondary | ICD-10-CM | POA: Insufficient documentation

## 2023-05-20 DIAGNOSIS — I484 Atypical atrial flutter: Secondary | ICD-10-CM | POA: Insufficient documentation

## 2023-05-20 DIAGNOSIS — Z8679 Personal history of other diseases of the circulatory system: Secondary | ICD-10-CM | POA: Diagnosis not present

## 2023-05-20 DIAGNOSIS — I3139 Other pericardial effusion (noninflammatory): Secondary | ICD-10-CM | POA: Diagnosis not present

## 2023-05-20 NOTE — Patient Instructions (Signed)
Medication Instructions:  Your physician recommends that you continue on your current medications as directed. Please refer to the Current Medication list given to you today.   Labwork: None today  Testing/Procedures: None today  Follow-Up: 6 months  Any Other Special Instructions Will Be Listed Below (If Applicable).  If you need a refill on your cardiac medications before your next appointment, please call your pharmacy.  

## 2023-05-20 NOTE — Progress Notes (Signed)
Cardiology Office Note  Date: 05/20/2023   ID: [name], DOB Apr 16, 1941, MRN [id number]  History of Present Illness: [name] is an 82 y.o. female last seen in January.  She is here today with her daughter for follow-up visit.  Reports no angina or sense of palpitations at current level of activity.  She is using a walker today.  Stable NYHA class II dyspnea.  She is on Coumadin with follow-up in the anticoagulation clinic.  She does not report any spontaneous bleeding problems.  Follow-up echocardiogram as noted below.  Overall stable findings, we discussed this today.  I reviewed her medications.  She is tolerating therapy well and heart rate is controlled today in atrial fibrillation.  ECG today shows rate controlled atrial fibrillation with IVCD and poor R wave progression, rule out old ant infarct pattern.  Physical Exam: VS:  BP 120/62   Pulse 82   Ht 5\' 5"  (1.651 m)   Wt 186 lb (84.4 kg)   SpO2 98%   BMI 30.95 kg/m , BMI Body mass index is 30.95 kg/m.  Wt Readings from Last 3 Encounters:  05/20/23 186 lb (84.4 kg)  11/11/22 179 lb 3.2 oz (81.3 kg)  05/03/22 183 lb (83 kg)    General: Patient appears comfortable at rest. HEENT: Conjunctiva and lids normal. Neck: Supple, no elevated JVP or carotid bruits. Lungs: Clear to auscultation, nonlabored breathing at rest. Cardiac: Irregularly irregular, 2/6 systolic murmur. Extremities: No pitting edema.  ECG:  An ECG dated 05/03/2022 was personally reviewed today and demonstrated:  Atypical atrial flutter with 4:1 block and low voltage.  Labwork:  July 2023: Hemoglobin A1c 8.8%, cholesterol 137, triglycerides 88, HDL 45, LDL 75, AST 27, ALT 43, potassium 4.4, creatinine 1.85, hemoglobin 12.0   Other Studies Reviewed Today:  Echocardiogram 05/13/2023:  1. Left ventricular ejection fraction, by estimation, is 55 to 60%. The  left ventricle has normal function. The left ventricle has no regional  wall  motion abnormalities. There is mild left ventricular hypertrophy.  Left ventricular diastolic parameters  are indeterminate.   2. Right ventricular systolic function is normal. The right ventricular  size is normal. There is normal pulmonary artery systolic pressure.   3. Left atrial size was severely dilated.   4. Right atrial size was moderately dilated.   5. Moderate pericardial effusion. The pericardial effusion is  circumferential. There is no evidence of cardiac tamponade.   6. The mitral valve is abnormal. Mild mitral valve regurgitation. No  evidence of mitral stenosis. Moderate mitral annular calcification.   7. The tricuspid valve is abnormal.   8. The aortic valve has an indeterminant number of cusps. There is severe  calcifcation of the aortic valve. There is severe thickening of the aortic  valve. Aortic valve regurgitation is not visualized. Mild aortic valve  stenosis. Aortic valve mean  gradient measures 15.0 mmHg. Aortic valve peak gradient measures 23.8  mmHg. Aortic valve area, by VTI measures 1.55 cm.   9. The inferior vena cava is dilated in size with >50% respiratory  variability, suggesting right atrial pressure of 8 mmHg.   Assessment and Plan:  1.  Persistent/permanent atypical atrial flutter with plan for heart rate control strategy.  CHA2DS2-VASc score is 8.  She is on Coumadin with follow-up in the anticoagulation clinic.  Heart rate controlled on Coreg, no changes were made.   2.  HFrecEF with ischemic cardiomyopathy, LVEF 55 to 60% by recent echocardiogram.  Blood pressure and  CKD with propensity to hyperkalemia limit GDMT in terms of MRA and Entresto.  Currently on Coreg, Ozempic and Lasix.   3.  Aortic stenosis, mild by recent follow-up echocardiogram with mean AV gradient 15 mmHg.   4.  Pericardial effusion, stable and moderate in size overall without evidence of hemodynamic compromise by recent echocardiogram.   5.  CKD stage IIIb, creatinine 2.1 in  October 2023.   6.  CAD with history of DES to mid-RCA and DES to mid-LAD in 2006, DES to ISR of RCA in 2009, inferior STEMI in 07/2020 with ISR of RCA and treated with DES and NSTEMI in 01/2021 with cath showing patent stents with no culprit lesions.  No active angina at current level of activity.  Continue Plavix, Lipitor, Imdur, and as needed nitroglycerin.  Disposition:  Follow up  6 months.  Signed, Jonelle Sidle, M.D., F.A.C.C. [facility] at [facility]

## 2023-05-29 ENCOUNTER — Ambulatory Visit: Payer: Medicare Other | Attending: Cardiology | Admitting: *Deleted

## 2023-05-29 DIAGNOSIS — Z5181 Encounter for therapeutic drug level monitoring: Secondary | ICD-10-CM

## 2023-05-29 DIAGNOSIS — Z86711 Personal history of pulmonary embolism: Secondary | ICD-10-CM | POA: Diagnosis not present

## 2023-05-29 DIAGNOSIS — I484 Atypical atrial flutter: Secondary | ICD-10-CM | POA: Diagnosis not present

## 2023-05-29 LAB — POCT INR: INR: 1.9 — AB (ref 2.0–3.0)

## 2023-05-29 NOTE — Patient Instructions (Signed)
Increase warfarin to 1 tablet daily except 1/2 tablet on Sundays and Wednesdays Recheck in 4 wks

## 2023-06-04 ENCOUNTER — Other Ambulatory Visit: Payer: Self-pay | Admitting: Cardiology

## 2023-07-04 ENCOUNTER — Other Ambulatory Visit: Payer: Self-pay | Admitting: Cardiology

## 2023-07-25 ENCOUNTER — Telehealth: Payer: Self-pay | Admitting: Cardiology

## 2023-07-25 NOTE — Telephone Encounter (Signed)
Pt's daughter Elita Quick came into office and stated that she called [facility] pharmacy on Wednesday to request a refill for pt's clopidogrel. She has not received a confirmation that the RX has been filled and she is concerned it did not get sent it. The Pt is completely out of this medication. I looked in chart and do not see where it was sent in but could be overlooking. [phone] is best number to reach [facility].

## 2023-07-25 NOTE — Telephone Encounter (Signed)
  Disp Refills Start End    clopidogrel (PLAVIX) 75 MG tablet 90 tablet 2 07/04/2023 --   Sig: Take 1 tablet by mouth once daily with breakfast   Sent to pharmacy as: clopidogrel (PLAVIX) 75 MG tablet   E-Prescribing Status: Receipt confirmed by pharmacy (07/04/2023  8:27 AM EDT)      Rx confirmed received 07/04/23 0827 hrs     Reached daughters voicemail and told her rx was [facility] on 07/04/23

## 2023-07-31 ENCOUNTER — Telehealth: Payer: Self-pay | Admitting: Cardiology

## 2023-07-31 DIAGNOSIS — R600 Localized edema: Secondary | ICD-10-CM | POA: Diagnosis not present

## 2023-07-31 DIAGNOSIS — I509 Heart failure, unspecified: Secondary | ICD-10-CM | POA: Diagnosis not present

## 2023-07-31 DIAGNOSIS — Z7901 Long term (current) use of anticoagulants: Secondary | ICD-10-CM | POA: Diagnosis not present

## 2023-07-31 NOTE — Telephone Encounter (Signed)
Patient daughter (DPR) notified and verbalized understanding. Pt's daughter had no further questions or concerns at this time.

## 2023-07-31 NOTE — Telephone Encounter (Signed)
   Reviewed Dr. Ival Bible last office note as he saw her in 05/2023. Weight was at 186 lbs at that time and would encourage them to follow daily weights. Agree with elevating her lower extremities and using compression stockings if able. Would also try to limit her sodium intake and keep fluid intake to approximately 2 L per day. She can increase Lasix from 20 mg twice daily to 40 mg in AM/20 mg in PM. If needing to do this for more than 5 days, she should make [place] aware as we would need to obtain follow-up labs (would check BNP and BMET).   Signed, Ellsworth Lennox, PA-C 07/31/2023, 3:57 PM Pager: [phone]

## 2023-07-31 NOTE — Telephone Encounter (Signed)
Spoke to pts daughter who stated pt is having swelling in her ankles- left is larger than the right. Pt denies sob/cp. Pt's daughter took pt to [facility] this morning where they had recommended that pt elevate her legs for relief. Pt's daughter stated that pt is currently taking lasix as prescribed 20 mg BID. Pts daughter is requesting to increase lasix for the next few days to help with swelling.   Please advise

## 2023-07-31 NOTE — Telephone Encounter (Signed)
Pt's daughter came into the office today and stated that she took the pt to urgent care this morning. The pt is experiencing swelling of her ankles. Pt's daughter denies any other symptoms. The pt is already on lasix and the daughter is wanting to know if they can increase the dose. [phone] is the best number to reach the daughter. Pt also sees [place].

## 2023-08-22 ENCOUNTER — Telehealth: Payer: Self-pay | Admitting: Cardiology

## 2023-08-22 DIAGNOSIS — R600 Localized edema: Secondary | ICD-10-CM

## 2023-08-22 DIAGNOSIS — Z79899 Other long term (current) drug therapy: Secondary | ICD-10-CM

## 2023-08-22 DIAGNOSIS — R609 Edema, unspecified: Secondary | ICD-10-CM

## 2023-08-22 MED ORDER — FUROSEMIDE 20 MG PO TABS: ORAL_TABLET | ORAL | 3 refills | Status: DC

## 2023-08-22 NOTE — Telephone Encounter (Signed)
Per last telephone note:     Reviewed Dr. Ival Bible last office note as he saw her in 05/2023. Weight was at 186 lbs at that time and would encourage them to follow daily weights. Agree with elevating her lower extremities and using compression stockings if able. Would also try to limit her sodium intake and keep fluid intake to approximately 2 L per day. She can increase Lasix from 20 mg twice daily to 40 mg in AM/20 mg in PM. If needing to do this for more than 5 days, she should make [place] aware as we would need to obtain follow-up labs (would check BNP and BMET).    Signed, Ellsworth Lennox, PA-C 07/31/2023, 3:57 PM Pager: [phone]       Please advise if pt needs lab work completed

## 2023-08-22 NOTE — Telephone Encounter (Signed)
Pt c/o medication issue:  1. Name of Medication: furosemide (LASIX) 20 MG tablet   2. How are you currently taking this medication (dosage and times per day)? 3x a day on tues, thurs, & fri. 2x a day on mon & wed  3. Are you having a reaction (difficulty breathing--STAT)? No   4. What is your medication issue? Is needing prescription rewritten and sent to [facility] pharmacy so she does not run out before prescription is due for a refill.   Please advise.

## 2023-08-22 NOTE — Telephone Encounter (Signed)
I refilled lasix to reflect new dose of lasix 40 mg am and 20 mg pm  Labs ordered for bmet,bnp to be done at [facility]

## 2023-08-28 ENCOUNTER — Other Ambulatory Visit: Payer: Self-pay | Admitting: Cardiology

## 2023-08-29 DIAGNOSIS — Z23 Encounter for immunization: Secondary | ICD-10-CM | POA: Diagnosis not present

## 2023-09-17 DIAGNOSIS — J0100 Acute maxillary sinusitis, unspecified: Secondary | ICD-10-CM | POA: Diagnosis not present

## 2023-09-17 DIAGNOSIS — R040 Epistaxis: Secondary | ICD-10-CM | POA: Diagnosis not present

## 2023-10-06 DIAGNOSIS — E782 Mixed hyperlipidemia: Secondary | ICD-10-CM | POA: Diagnosis not present

## 2023-10-06 DIAGNOSIS — N184 Chronic kidney disease, stage 4 (severe): Secondary | ICD-10-CM | POA: Diagnosis not present

## 2023-10-06 DIAGNOSIS — N186 End stage renal disease: Secondary | ICD-10-CM | POA: Diagnosis not present

## 2023-10-06 DIAGNOSIS — E1122 Type 2 diabetes mellitus with diabetic chronic kidney disease: Secondary | ICD-10-CM | POA: Diagnosis not present

## 2023-11-26 ENCOUNTER — Ambulatory Visit: Payer: Medicare Other | Admitting: Cardiology

## 2023-12-01 ENCOUNTER — Ambulatory Visit: Payer: Medicare Other | Attending: Physician Assistant | Admitting: Physician Assistant

## 2023-12-01 ENCOUNTER — Encounter: Payer: Self-pay | Admitting: Physician Assistant

## 2023-12-01 VITALS — BP 104/60 | HR 74 | Ht 65.0 in | Wt 185.8 lb

## 2023-12-01 DIAGNOSIS — N1832 Chronic kidney disease, stage 3b: Secondary | ICD-10-CM | POA: Diagnosis not present

## 2023-12-01 DIAGNOSIS — I484 Atypical atrial flutter: Secondary | ICD-10-CM

## 2023-12-01 DIAGNOSIS — I5032 Chronic diastolic (congestive) heart failure: Secondary | ICD-10-CM | POA: Diagnosis not present

## 2023-12-01 DIAGNOSIS — Z79899 Other long term (current) drug therapy: Secondary | ICD-10-CM

## 2023-12-01 DIAGNOSIS — I25119 Atherosclerotic heart disease of native coronary artery with unspecified angina pectoris: Secondary | ICD-10-CM

## 2023-12-01 DIAGNOSIS — I350 Nonrheumatic aortic (valve) stenosis: Secondary | ICD-10-CM | POA: Diagnosis not present

## 2023-12-01 DIAGNOSIS — R600 Localized edema: Secondary | ICD-10-CM

## 2023-12-01 NOTE — Progress Notes (Signed)
Cardiology Office Note:  .   Date:  12/01/2023  ID:  Don Perking, DOB 1941/08/05, MRN [id number] PCP: Roe [name], NP   [facility] Providers Cardiologist:  Nona Dell, MD    History of Present Illness: .   [name] is a 83 y.o. female with history of CAD, permanent atrial flutter, HFrecEF/ICM, AS, CKD3b, pericardial effusion.   Patient comes in with her daughter. She has had some left ankle swelling and takes another lasix but doesn't seem to help. She sleeps a lot during the day. Goes to a senior center 3 days a week and does exercises. Denies chest pain, dyspnea, dizziness,palpitations. Hasn't had INR checked since August.   ROS:    Studies Reviewed: Marland Kitchen         Prior CV Studies:   Echocardiogram 05/13/2023:  1. Left ventricular ejection fraction, by estimation, is 55 to 60%. The  left ventricle has normal function. The left ventricle has no regional  wall motion abnormalities. There is mild left ventricular hypertrophy.  Left ventricular diastolic parameters  are indeterminate.   2. Right ventricular systolic function is normal. The right ventricular  size is normal. There is normal pulmonary artery systolic pressure.   3. Left atrial size was severely dilated.   4. Right atrial size was moderately dilated.   5. Moderate pericardial effusion. The pericardial effusion is  circumferential. There is no evidence of cardiac tamponade.   6. The mitral valve is abnormal. Mild mitral valve regurgitation. No  evidence of mitral stenosis. Moderate mitral annular calcification.   7. The tricuspid valve is abnormal.   8. The aortic valve has an indeterminant number of cusps. There is severe  calcifcation of the aortic valve. There is severe thickening of the aortic  valve. Aortic valve regurgitation is not visualized. Mild aortic valve  stenosis. Aortic valve mean  gradient measures 15.0 mmHg. Aortic valve peak gradient measures 23.8  mmHg. Aortic valve  area, by VTI measures 1.55 cm.   9. The inferior vena cava is dilated in size with >50% respiratory  variability, suggesting right atrial pressure of 8 mmHg.     Risk Assessment/Calculations:    CHA2DS2-VASc Score = 7   This indicates a 11.2% annual risk of stroke. The patient's score is based upon: CHF History: 1 HTN History: 1 Diabetes History: 1 Stroke History: 0 Vascular Disease History: 1 Age Score: 2 Gender Score: 1            Physical Exam:   VS:  BP 104/60 (BP Location: Right Arm, Patient Position: Sitting, Cuff Size: Normal)   Pulse 74   Ht 5\' 5"  (1.651 m)   Wt 185 lb 12.8 oz (84.3 kg)   SpO2 95%   BMI 30.92 kg/m    Wt Readings from Last 3 Encounters:  12/01/23 185 lb 12.8 oz (84.3 kg)  05/20/23 186 lb (84.4 kg)  11/11/22 179 lb 3.2 oz (81.3 kg)    GEN: Well nourished, well developed in no acute distress NECK: No JVD; No carotid bruits CARDIAC: irreg 2/6 systolic murmur RSB/LSB, no rubs, gallops RESPIRATORY:  Clear to auscultation without rales, wheezing or rhonchi  ABDOMEN: Soft, non-tender, non-distended EXTREMITIES:  plus 2 edema; No deformity   ASSESSMENT AND PLAN: .    Permanent Aflutter-rate controlled on coreg, also on coumadin but hasn't had INR checked since Aug. Will check today. No bleeding problems.   HFrecEF with ischemic CM EF 55-60%, hyperkalemia in past limiting GDMT-leg edema with no improvement  with extra lasix, won't wear compression hose. 2 gm sodium diet, keep legs elevated, can wrap with Ace bandages  Aortic stenosis mild on echo-f/u in 6 months.  CAD  DES to mid-RCA and DES to mid-LAD in 2006, DES to ISR of RCA in 2009, inferior STEMI in 07/2020 with ISR of RCA and treated with DES and NSTEMI in 01/2021 with cath showing patent stents with no culprit lesions.  No active angina.  Continue Plavix, Lipitor, Imdur, and as needed nitroglycerin.Check labs today  CKD stage 3b-check labs        Dispo: f/u with Dr. Diona Browner in 6  months.  Signed, Jacolyn Reedy, PA-C

## 2023-12-01 NOTE — Patient Instructions (Addendum)
Medication Instructions:  Your physician recommends that you continue on your current medications as directed. Please refer to the Current Medication list given to you today.  Labwork: CMET, CBC, INR today at [facility]  Testing/Procedures: none  Follow-Up: Your physician recommends that you schedule a follow-up appointment in: 6 months  Any Other Special Instructions Will Be Listed Below (If Applicable).  If you need a refill on your cardiac medications before your next appointment, please call your pharmacy.

## 2023-12-09 ENCOUNTER — Telehealth: Payer: Self-pay

## 2023-12-09 MED ORDER — FUROSEMIDE 20 MG PO TABS: ORAL_TABLET | ORAL | 3 refills | Status: DC

## 2023-12-09 NOTE — Telephone Encounter (Signed)
 Patients daughter walked into the office. Pt is currently out of Furosemide. Pt's daughter also wanted lab orders printed to take pt to [facility] as [facility] lab has closed for the day.   Medication refill completed.

## 2023-12-29 ENCOUNTER — Encounter (HOSPITAL_COMMUNITY): Payer: Self-pay | Admitting: Emergency Medicine

## 2023-12-29 ENCOUNTER — Emergency Department (HOSPITAL_COMMUNITY)

## 2023-12-29 ENCOUNTER — Inpatient Hospital Stay (HOSPITAL_COMMUNITY)
Admission: EM | Admit: 2023-12-29 | Discharge: 2024-01-13 | DRG: 291 | Disposition: E | Attending: Internal Medicine | Admitting: Internal Medicine

## 2023-12-29 ENCOUNTER — Other Ambulatory Visit: Payer: Self-pay

## 2023-12-29 DIAGNOSIS — J9602 Acute respiratory failure with hypercapnia: Secondary | ICD-10-CM | POA: Diagnosis present

## 2023-12-29 DIAGNOSIS — Z66 Do not resuscitate: Secondary | ICD-10-CM | POA: Diagnosis present

## 2023-12-29 DIAGNOSIS — R600 Localized edema: Secondary | ICD-10-CM | POA: Diagnosis not present

## 2023-12-29 DIAGNOSIS — E785 Hyperlipidemia, unspecified: Secondary | ICD-10-CM | POA: Diagnosis present

## 2023-12-29 DIAGNOSIS — R0689 Other abnormalities of breathing: Secondary | ICD-10-CM | POA: Diagnosis not present

## 2023-12-29 DIAGNOSIS — N189 Chronic kidney disease, unspecified: Secondary | ICD-10-CM | POA: Diagnosis not present

## 2023-12-29 DIAGNOSIS — E11649 Type 2 diabetes mellitus with hypoglycemia without coma: Secondary | ICD-10-CM | POA: Diagnosis present

## 2023-12-29 DIAGNOSIS — N183 Chronic kidney disease, stage 3 unspecified: Secondary | ICD-10-CM | POA: Diagnosis present

## 2023-12-29 DIAGNOSIS — R0989 Other specified symptoms and signs involving the circulatory and respiratory systems: Secondary | ICD-10-CM | POA: Diagnosis not present

## 2023-12-29 DIAGNOSIS — E1122 Type 2 diabetes mellitus with diabetic chronic kidney disease: Secondary | ICD-10-CM | POA: Diagnosis present

## 2023-12-29 DIAGNOSIS — N179 Acute kidney failure, unspecified: Secondary | ICD-10-CM | POA: Diagnosis present

## 2023-12-29 DIAGNOSIS — L89152 Pressure ulcer of sacral region, stage 2: Secondary | ICD-10-CM | POA: Diagnosis present

## 2023-12-29 DIAGNOSIS — I2510 Atherosclerotic heart disease of native coronary artery without angina pectoris: Secondary | ICD-10-CM | POA: Diagnosis present

## 2023-12-29 DIAGNOSIS — R918 Other nonspecific abnormal finding of lung field: Secondary | ICD-10-CM | POA: Diagnosis not present

## 2023-12-29 DIAGNOSIS — J9801 Acute bronchospasm: Secondary | ICD-10-CM | POA: Diagnosis not present

## 2023-12-29 DIAGNOSIS — Z951 Presence of aortocoronary bypass graft: Secondary | ICD-10-CM | POA: Diagnosis not present

## 2023-12-29 DIAGNOSIS — I959 Hypotension, unspecified: Secondary | ICD-10-CM | POA: Diagnosis not present

## 2023-12-29 DIAGNOSIS — I130 Hypertensive heart and chronic kidney disease with heart failure and stage 1 through stage 4 chronic kidney disease, or unspecified chronic kidney disease: Secondary | ICD-10-CM | POA: Diagnosis present

## 2023-12-29 DIAGNOSIS — J9811 Atelectasis: Secondary | ICD-10-CM | POA: Diagnosis not present

## 2023-12-29 DIAGNOSIS — I110 Hypertensive heart disease with heart failure: Secondary | ICD-10-CM | POA: Diagnosis not present

## 2023-12-29 DIAGNOSIS — S42201A Unspecified fracture of upper end of right humerus, initial encounter for closed fracture: Secondary | ICD-10-CM | POA: Diagnosis not present

## 2023-12-29 DIAGNOSIS — L539 Erythematous condition, unspecified: Secondary | ICD-10-CM | POA: Diagnosis present

## 2023-12-29 DIAGNOSIS — J90 Pleural effusion, not elsewhere classified: Secondary | ICD-10-CM | POA: Diagnosis not present

## 2023-12-29 DIAGNOSIS — I10 Essential (primary) hypertension: Secondary | ICD-10-CM | POA: Diagnosis present

## 2023-12-29 DIAGNOSIS — I350 Nonrheumatic aortic (valve) stenosis: Secondary | ICD-10-CM | POA: Diagnosis present

## 2023-12-29 DIAGNOSIS — R791 Abnormal coagulation profile: Secondary | ICD-10-CM | POA: Diagnosis present

## 2023-12-29 DIAGNOSIS — Z789 Other specified health status: Secondary | ICD-10-CM

## 2023-12-29 DIAGNOSIS — I5033 Acute on chronic diastolic (congestive) heart failure: Secondary | ICD-10-CM | POA: Insufficient documentation

## 2023-12-29 DIAGNOSIS — Z515 Encounter for palliative care: Secondary | ICD-10-CM | POA: Diagnosis not present

## 2023-12-29 DIAGNOSIS — Z8261 Family history of arthritis: Secondary | ICD-10-CM

## 2023-12-29 DIAGNOSIS — N170 Acute kidney failure with tubular necrosis: Secondary | ICD-10-CM | POA: Diagnosis not present

## 2023-12-29 DIAGNOSIS — Z86711 Personal history of pulmonary embolism: Secondary | ICD-10-CM

## 2023-12-29 DIAGNOSIS — Z809 Family history of malignant neoplasm, unspecified: Secondary | ICD-10-CM

## 2023-12-29 DIAGNOSIS — I4891 Unspecified atrial fibrillation: Secondary | ICD-10-CM | POA: Diagnosis present

## 2023-12-29 DIAGNOSIS — Z7901 Long term (current) use of anticoagulants: Secondary | ICD-10-CM | POA: Diagnosis not present

## 2023-12-29 DIAGNOSIS — Z1152 Encounter for screening for COVID-19: Secondary | ICD-10-CM

## 2023-12-29 DIAGNOSIS — I4892 Unspecified atrial flutter: Secondary | ICD-10-CM | POA: Diagnosis present

## 2023-12-29 DIAGNOSIS — I255 Ischemic cardiomyopathy: Secondary | ICD-10-CM | POA: Diagnosis present

## 2023-12-29 DIAGNOSIS — J9601 Acute respiratory failure with hypoxia: Secondary | ICD-10-CM | POA: Diagnosis not present

## 2023-12-29 DIAGNOSIS — F0150 Vascular dementia without behavioral disturbance: Secondary | ICD-10-CM | POA: Diagnosis present

## 2023-12-29 DIAGNOSIS — Z8249 Family history of ischemic heart disease and other diseases of the circulatory system: Secondary | ICD-10-CM

## 2023-12-29 DIAGNOSIS — N178 Other acute kidney failure: Secondary | ICD-10-CM | POA: Diagnosis not present

## 2023-12-29 DIAGNOSIS — R4182 Altered mental status, unspecified: Secondary | ICD-10-CM | POA: Diagnosis not present

## 2023-12-29 DIAGNOSIS — I517 Cardiomegaly: Secondary | ICD-10-CM | POA: Diagnosis not present

## 2023-12-29 DIAGNOSIS — Z7902 Long term (current) use of antithrombotics/antiplatelets: Secondary | ICD-10-CM

## 2023-12-29 DIAGNOSIS — I5031 Acute diastolic (congestive) heart failure: Secondary | ICD-10-CM | POA: Diagnosis not present

## 2023-12-29 DIAGNOSIS — T383X5A Adverse effect of insulin and oral hypoglycemic [antidiabetic] drugs, initial encounter: Secondary | ICD-10-CM | POA: Diagnosis present

## 2023-12-29 DIAGNOSIS — R0902 Hypoxemia: Secondary | ICD-10-CM | POA: Diagnosis not present

## 2023-12-29 DIAGNOSIS — I509 Heart failure, unspecified: Secondary | ICD-10-CM | POA: Diagnosis not present

## 2023-12-29 DIAGNOSIS — Z452 Encounter for adjustment and management of vascular access device: Secondary | ICD-10-CM | POA: Diagnosis not present

## 2023-12-29 DIAGNOSIS — L8990 Pressure ulcer of unspecified site, unspecified stage: Secondary | ICD-10-CM | POA: Diagnosis present

## 2023-12-29 DIAGNOSIS — E162 Hypoglycemia, unspecified: Secondary | ICD-10-CM | POA: Diagnosis not present

## 2023-12-29 DIAGNOSIS — R0603 Acute respiratory distress: Secondary | ICD-10-CM | POA: Diagnosis not present

## 2023-12-29 DIAGNOSIS — I5043 Acute on chronic combined systolic (congestive) and diastolic (congestive) heart failure: Secondary | ICD-10-CM | POA: Diagnosis present

## 2023-12-29 DIAGNOSIS — Z955 Presence of coronary angioplasty implant and graft: Secondary | ICD-10-CM

## 2023-12-29 DIAGNOSIS — T45515A Adverse effect of anticoagulants, initial encounter: Secondary | ICD-10-CM | POA: Diagnosis present

## 2023-12-29 DIAGNOSIS — Z79899 Other long term (current) drug therapy: Secondary | ICD-10-CM

## 2023-12-29 DIAGNOSIS — Z7984 Long term (current) use of oral hypoglycemic drugs: Secondary | ICD-10-CM

## 2023-12-29 DIAGNOSIS — Z87891 Personal history of nicotine dependence: Secondary | ICD-10-CM

## 2023-12-29 DIAGNOSIS — Z833 Family history of diabetes mellitus: Secondary | ICD-10-CM

## 2023-12-29 DIAGNOSIS — I3139 Other pericardial effusion (noninflammatory): Secondary | ICD-10-CM | POA: Diagnosis not present

## 2023-12-29 DIAGNOSIS — J811 Chronic pulmonary edema: Secondary | ICD-10-CM | POA: Diagnosis not present

## 2023-12-29 DIAGNOSIS — D631 Anemia in chronic kidney disease: Secondary | ICD-10-CM | POA: Diagnosis present

## 2023-12-29 DIAGNOSIS — I252 Old myocardial infarction: Secondary | ICD-10-CM

## 2023-12-29 DIAGNOSIS — Z8673 Personal history of transient ischemic attack (TIA), and cerebral infarction without residual deficits: Secondary | ICD-10-CM

## 2023-12-29 DIAGNOSIS — Z7985 Long-term (current) use of injectable non-insulin antidiabetic drugs: Secondary | ICD-10-CM

## 2023-12-29 LAB — COMPREHENSIVE METABOLIC PANEL
ALT: 24 U/L (ref 0–44)
AST: 22 U/L (ref 15–41)
Albumin: 3.4 g/dL — ABNORMAL LOW (ref 3.5–5.0)
Alkaline Phosphatase: 56 U/L (ref 38–126)
Anion gap: 10 (ref 5–15)
BUN: 93 mg/dL — ABNORMAL HIGH (ref 8–23)
CO2: 31 mmol/L (ref 22–32)
Calcium: 9.4 mg/dL (ref 8.9–10.3)
Chloride: 98 mmol/L (ref 98–111)
Creatinine, Ser: 2.67 mg/dL — ABNORMAL HIGH (ref 0.44–1.00)
GFR, Estimated: 17 mL/min — ABNORMAL LOW (ref >60)
Glucose, Bld: 223 mg/dL — ABNORMAL HIGH (ref 70–99)
Potassium: 4.4 mmol/L (ref 3.5–5.1)
Sodium: 139 mmol/L (ref 135–145)
Total Bilirubin: 1.3 mg/dL — ABNORMAL HIGH (ref 0.0–1.2)
Total Protein: 6.8 g/dL (ref 6.5–8.1)

## 2023-12-29 LAB — BLOOD GAS, ARTERIAL
Acid-Base Excess: 7.2 mmol/L — ABNORMAL HIGH (ref 0.0–2.0)
Acid-Base Excess: 8.5 mmol/L — ABNORMAL HIGH (ref 0.0–2.0)
Bicarbonate: 33.9 mmol/L — ABNORMAL HIGH (ref 20.0–28.0)
Bicarbonate: 35.3 mmol/L — ABNORMAL HIGH (ref 20.0–28.0)
Drawn by: 35043
Drawn by: 35043
O2 Saturation: 92.4 %
O2 Saturation: 99 %
Patient temperature: 36.8
Patient temperature: 37
pCO2 arterial: 60 mmHg — ABNORMAL HIGH (ref 32–48)
pCO2 arterial: 57 mmHg — ABNORMAL HIGH (ref 32–48)
pH, Arterial: 7.36 (ref 7.35–7.45)
pH, Arterial: 7.4 (ref 7.35–7.45)
pO2, Arterial: 58 mmHg — ABNORMAL LOW (ref 83–108)
pO2, Arterial: 89 mmHg (ref 83–108)

## 2023-12-29 LAB — CBC WITH DIFFERENTIAL/PLATELET
Abs Immature Granulocytes: 0.03 10*3/uL (ref 0.00–0.07)
Basophils Absolute: 0 10*3/uL (ref 0.0–0.1)
Basophils Relative: 1 %
Eosinophils Absolute: 0 10*3/uL (ref 0.0–0.5)
Eosinophils Relative: 0 %
HCT: 32.5 % — ABNORMAL LOW (ref 36.0–46.0)
Hemoglobin: 10.2 g/dL — ABNORMAL LOW (ref 12.0–15.0)
Immature Granulocytes: 1 %
Lymphocytes Relative: 17 %
Lymphs Abs: 0.8 10*3/uL (ref 0.7–4.0)
MCH: 31.3 pg (ref 26.0–34.0)
MCHC: 31.4 g/dL (ref 30.0–36.0)
MCV: 99.7 fL (ref 80.0–100.0)
Monocytes Absolute: 0.4 10*3/uL (ref 0.1–1.0)
Monocytes Relative: 10 %
Neutro Abs: 3.3 10*3/uL (ref 1.7–7.7)
Neutrophils Relative %: 71 %
Platelets: 230 10*3/uL (ref 150–400)
RBC: 3.26 MIL/uL — ABNORMAL LOW (ref 3.87–5.11)
RDW: 15.9 % — ABNORMAL HIGH (ref 11.5–15.5)
WBC: 4.5 10*3/uL (ref 4.0–10.5)
nRBC: 0 % (ref 0.0–0.2)

## 2023-12-29 LAB — RESP PANEL BY RT-PCR (RSV, FLU A&B, COVID)  RVPGX2
Influenza A by PCR: NEGATIVE
Influenza B by PCR: NEGATIVE
Resp Syncytial Virus by PCR: NEGATIVE
SARS Coronavirus 2 by RT PCR: NEGATIVE

## 2023-12-29 LAB — PROTIME-INR
INR: 7.1 (ref 0.8–1.2)
Prothrombin Time: 61.2 s — ABNORMAL HIGH (ref 11.4–15.2)

## 2023-12-29 LAB — LACTIC ACID, PLASMA: Lactic Acid, Venous: 1.1 mmol/L (ref 0.5–1.9)

## 2023-12-29 LAB — BRAIN NATRIURETIC PEPTIDE: B Natriuretic Peptide: 686 pg/mL — ABNORMAL HIGH (ref 0.0–100.0)

## 2023-12-29 LAB — APTT: aPTT: 58 s — ABNORMAL HIGH (ref 24–36)

## 2023-12-29 MED ORDER — FUROSEMIDE 10 MG/ML IJ SOLN
40.0000 mg | INTRAMUSCULAR | Status: AC
  Administered 2023-12-29: 40 mg via INTRAVENOUS
  Filled 2023-12-29: qty 4

## 2023-12-29 MED ORDER — SODIUM CHLORIDE 0.9 % IV SOLN
500.0000 mg | Freq: Once | INTRAVENOUS | Status: AC
  Administered 2023-12-29: 500 mg via INTRAVENOUS
  Filled 2023-12-29: qty 5

## 2023-12-29 MED ORDER — SODIUM CHLORIDE 0.9 % IV SOLN
2.0000 g | Freq: Once | INTRAVENOUS | Status: AC
  Administered 2023-12-29: 2 g via INTRAVENOUS
  Filled 2023-12-29: qty 20

## 2023-12-29 MED ORDER — IPRATROPIUM-ALBUTEROL 0.5-2.5 (3) MG/3ML IN SOLN
3.0000 mL | Freq: Once | RESPIRATORY_TRACT | Status: AC
  Administered 2023-12-29: 3 mL via RESPIRATORY_TRACT
  Filled 2023-12-29: qty 3

## 2023-12-29 NOTE — ED Provider Notes (Signed)
 [place] EMERGENCY DEPARTMENT AT [facility] Provider Note   CSN: [id number] Arrival date & time: 12/29/23  1619     History  Chief Complaint  Patient presents with   Shortness of Breath    [name] is a 83 y.o. female.   Shortness of Breath  This patient is an 40 female, she presents to the hospital for shortness of breath, she has been sick for the last day or 2, she has had oxygen down to about 80%, states that she has not been feeling well with shortness of breath and according to the daughter was excessively sleepy.  No vomiting or diarrhea, requiring oxygen prehospital.  Patient denies history of congestive heart failure but is a diabetic and has hypertension.  Echocardiogram was reviewed from July 2024 showing ejection fraction of 55 to 60%, there is mild aortic stenosis and a mild pericardial effusion which was stable.    Home Medications Prior to Admission medications   Medication Sig Start Date End Date Taking? Authorizing Provider  albuterol (VENTOLIN HFA) 108 (90 Base) MCG/ACT inhaler Inhale 2 puffs into the lungs every 6 (six) hours as needed for wheezing or shortness of breath. 11/11/20   Sherryll Burger, Pratik D, DO  ALPRAZolam Prudy Feeler) 0.25 MG tablet Take 0.125-0.25 mg by mouth See admin instructions. 0.125 in the morning. 0.25 mg at bedtime 06/29/20   [provider]  Ascorbic Acid (VITAMIN C GUMMIES PO) Take 2 each by mouth daily. 180 mg    [provider]  atorvastatin (LIPITOR) 40 MG tablet Take 1 tablet (40 mg total) by mouth daily. 02/07/21 11/30/96  Zigmund Daniel., MD  b complex vitamins capsule Take 1 capsule by mouth daily.    [provider]  carvedilol (COREG) 12.5 MG tablet Take 1 tablet by mouth twice daily 02/05/23   Iran Ouch, [place] M, PA-C  cetirizine (ZYRTEC) 10 MG tablet Take by mouth at bedtime. 05/09/23   [provider]  cholecalciferol (VITAMIN D3) 25 MCG (1000 UNIT) tablet Take 1,000 Units by  mouth daily.    [provider]  clopidogrel (PLAVIX) 75 MG tablet Take 1 tablet by mouth once daily with breakfast 07/04/23   Jonelle Sidle, MD  Coenzyme Q10 (CO Q 10) 100 MG CAPS Take 100 mg by mouth daily.    [provider]  furosemide (LASIX) 20 MG tablet Take 40 mg am and 20 mg pm 12/09/23   Jonelle Sidle, MD  glipiZIDE (GLUCOTROL XL) 5 MG 24 hr tablet Take 5 mg by mouth in the morning and at bedtime. 09/12/20   [provider]  isosorbide mononitrate (IMDUR) 30 MG 24 hr tablet Take 1 tablet by mouth once daily 08/28/23   Jonelle Sidle, MD  [facility] OMEGA-3 KRILL OIL PO Take 1,000 mg by mouth daily. When she remembers    [provider]  nitroGLYCERIN (NITROSTAT) 0.4 MG SL tablet Place 1 tablet (0.4 mg total) under the tongue every 5 (five) minutes x 3 doses as needed for chest pain. 08/03/20   Netta Neat., NP  pioglitazone (ACTOS) 30 MG tablet Take by mouth daily. 01/03/23   [provider]  Polyethyl Glycol-Propyl Glycol (SYSTANE OP) Apply 1 drop to eye 3 (three) times daily as needed (dry eyes).    [provider]  Semaglutide,0.25 or 0.5MG /DOS, (OZEMPIC, 0.25 OR 0.5 MG/DOSE,) 2 MG/1.5ML SOPN Inject 0.25 mg into the skin once a week.    [provider]  vitamin  E 180 MG (400 UNITS) capsule Take 400 Units by mouth daily.    [provider]  warfarin (COUMADIN) 5 MG tablet Take 5 mg by mouth every evening. Except 2.5 mg on Sunday & Wednesday 05/31/18   [provider]      Allergies    Patient has no known allergies.    Review of Systems   Review of Systems  Respiratory:  Positive for shortness of breath.   All other systems reviewed and are negative.   Physical Exam Updated Vital Signs BP 104/60 (BP Location: Left Arm)   Pulse 100   Temp 98.3 F (36.8 C) (Oral)   Resp (!) 22   Ht 1.651 m (5\' 5" )   Wt 85 kg   SpO2 (!) 80%   BMI 31.18 kg/m  Physical Exam Vitals and nursing  note reviewed.  Constitutional:      General: She is in acute distress.     Appearance: She is well-developed.  HENT:     Head: Normocephalic and atraumatic.     Mouth/Throat:     Pharynx: No oropharyngeal exudate.  Eyes:     General: No scleral icterus.       Right eye: No discharge.        Left eye: No discharge.     Conjunctiva/sclera: Conjunctivae normal.     Pupils: Pupils are equal, round, and reactive to light.  Neck:     Thyroid: No thyromegaly.     Vascular: No JVD.  Cardiovascular:     Rate and Rhythm: Regular rhythm. Tachycardia present.     Heart sounds: Normal heart sounds. No murmur heard.    No friction rub. No gallop.  Pulmonary:     Effort: Respiratory distress present.     Breath sounds: Wheezing, rhonchi and rales present.  Abdominal:     General: Bowel sounds are normal. There is no distension.     Palpations: Abdomen is soft. There is no mass.     Tenderness: There is no abdominal tenderness.  Musculoskeletal:        General: No tenderness. Normal range of motion.     Cervical back: Normal range of motion and neck supple.     Right lower leg: No edema.     Left lower leg: No edema.  Lymphadenopathy:     Cervical: No cervical adenopathy.  Skin:    General: Skin is warm and dry.     Findings: No erythema or rash.  Neurological:     General: No focal deficit present.     Mental Status: She is alert.     Coordination: Coordination normal.  Psychiatric:        Behavior: Behavior normal.     ED Results / Procedures / Treatments   Labs (all labs ordered are listed, but only abnormal results are displayed) Labs Reviewed  RESP PANEL BY RT-PCR (RSV, FLU A&B, COVID)  [id number]  CULTURE, BLOOD (ROUTINE X 2)  CULTURE, BLOOD (ROUTINE X 2)  LACTIC ACID, PLASMA  LACTIC ACID, PLASMA  COMPREHENSIVE METABOLIC PANEL  CBC WITH DIFFERENTIAL/PLATELET  PROTIME-INR  APTT  URINALYSIS, W/ REFLEX TO CULTURE (INFECTION SUSPECTED)    EKG EKG  Interpretation Date/Time:  Monday December 29 2023 16:39:25 EDT Ventricular Rate:  94 PR Interval:    QRS Duration:  135 QT Interval:  456 QTC Calculation: 577 R Axis:   78  Text Interpretation: Atrial fibrillation Nonspecific intraventricular conduction delay Anteroseptal infarct, old Repol abnrm suggests ischemia, lateral leads  since last tracing no significant change Confirmed by Eber Hong ([id number]) on 12/29/2023 4:52:48 PM  Radiology DG Chest Port 1 View Result Date: 12/29/2023 CLINICAL DATA:  Sepsis. EXAM: PORTABLE CHEST 1 VIEW COMPARISON:  Chest radiograph dated 10/10/2021. FINDINGS: No focal consolidation, pleural effusion, pneumothorax. Stable cardiomegaly. Atherosclerotic calcification of the aorta. Osteopenia with degenerative changes of the spine. No acute osseous pathology. IMPRESSION: 1. No active disease. 2. Cardiomegaly. Electronically Signed   By: Elgie Collard M.D.   On: 12/29/2023 17:49    Procedures .Critical Care  Performed by: Eber Hong, MD Authorized by: Eber Hong, MD   Critical care provider statement:    Critical care time (minutes):  45   Critical care time was exclusive of:  Separately billable procedures and treating other patients   Critical care was necessary to treat or prevent imminent or life-threatening deterioration of the following conditions:  Respiratory failure   Critical care was time spent personally by me on the following activities:  Development of treatment plan with patient or surrogate, discussions with consultants, evaluation of patient's response to treatment, examination of patient, obtaining history from patient or surrogate, review of old charts, re-evaluation of patient's condition, pulse oximetry, ordering and review of radiographic studies, ordering and review of laboratory studies and ordering and performing treatments and interventions   I assumed direction of critical care for this patient from another provider in my  specialty: no     Care discussed with: admitting provider   Comments:           Medications Ordered in ED Medications  cefTRIAXone (ROCEPHIN) 2 g in sodium chloride 0.9 % 100 mL IVPB (has no administration in time range)  azithromycin (ZITHROMAX) 500 mg in sodium chloride 0.9 % 250 mL IVPB (has no administration in time range)    ED Course/ Medical Decision Making/ A&P                                 Medical Decision Making Amount and/or Complexity of Data Reviewed Labs: ordered. Radiology: ordered.  Risk Prescription drug management. Decision regarding hospitalization.   This patient is ill-appearing with what appears to be increasing shortness of breath, oxygen drops down to 80% on room air, she does have some rales in her lungs that seem to clear with deep breathing but she is diffusely short of breath with a prolonged expiratory phase and increased work of breathing.  She is able to speak in just shortened sentences and has no significant edema of her legs.  Patient has known A-fib, on Plavix and possibly warfarin   Co morbidities that complicate the patient evaluation  Hypertension   Additional history obtained:  Additional history obtained from medical record External records from outside source obtained and reviewed including last echocardiogram   Lab Tests:  I Ordered, and personally interpreted labs.  The pertinent results include: BNP is elevated, ABG shows hypercapnia with a CO2 of 60, pH is normal, pO2 is low, lactate is normal, COVID and flu testing is negative, metabolic panel shows chronic renal insufficiency which is slightly worsened to 2.67 compared to 2.1, BUN is up to 93 from 50 all suggestive of a slight dehydration   Imaging Studies ordered:  I ordered imaging studies including chest x-ray I independently visualized and interpreted imaging which showed cardiomegaly but no acute pulmonary edema I agree with the radiologist  interpretation   Cardiac Monitoring: / EKG:  The  patient was maintained on a cardiac monitor.  I personally viewed and interpreted the cardiac monitored which showed an underlying rhythm of: Atrial fibrillation   Consultations Obtained:  I requested consultation with the hospitalist Dr. Arville Care,  and discussed lab and imaging findings as well as pertinent plan - they recommend: Admission to the hospital   Problem List / ED Course / Critical interventions / Medication management  There is some difficulty in elucidating the exact cause of the hypoxia however she does have some hypoxia and some hypercapnic respiratory failure.  She required BiPAP to help with her CO2 and this is starting to come down on a repeat ABG.  She has been given medications including Lasix to help with heart failure although she probably needs some IV fluids as her BUN and creatinine are elevated and suggestive of a possible dehydration.  She has not been able to make any urine I ordered medication including Rocephin and Zithromax as well as a DuoNeb and Lasix for respiratory distress Reevaluation of the patient after these medicines showed that the patient proved I have reviewed the patients home medicines and have made adjustments as needed   Social Determinants of Health:  Elderly with some dementia   Test / Admission - Considered:  Admit to higher level of care         Final Clinical Impression(s) / ED Diagnoses Final diagnoses:  None    Rx / DC Orders ED Discharge Orders     None         Eber Hong, MD 12/29/23 2339

## 2023-12-29 NOTE — ED Triage Notes (Signed)
 Daughter called ems due to pt being sob when they arrived she was in the 80s on room air. Pt states she does not feel well, but denies any pain.

## 2023-12-29 NOTE — Sepsis Progress Note (Signed)
 Elink monitoring for the code sepsis protocol.

## 2023-12-29 NOTE — ED Notes (Signed)
 Patient's 02 sat reading 82-88. Christian Paramedic made aware

## 2023-12-30 DIAGNOSIS — I5031 Acute diastolic (congestive) heart failure: Secondary | ICD-10-CM | POA: Diagnosis not present

## 2023-12-30 DIAGNOSIS — Z86711 Personal history of pulmonary embolism: Secondary | ICD-10-CM

## 2023-12-30 DIAGNOSIS — I10 Essential (primary) hypertension: Secondary | ICD-10-CM | POA: Diagnosis not present

## 2023-12-30 DIAGNOSIS — E785 Hyperlipidemia, unspecified: Secondary | ICD-10-CM | POA: Insufficient documentation

## 2023-12-30 DIAGNOSIS — E1122 Type 2 diabetes mellitus with diabetic chronic kidney disease: Secondary | ICD-10-CM | POA: Insufficient documentation

## 2023-12-30 DIAGNOSIS — J9601 Acute respiratory failure with hypoxia: Secondary | ICD-10-CM | POA: Diagnosis not present

## 2023-12-30 DIAGNOSIS — E162 Hypoglycemia, unspecified: Secondary | ICD-10-CM

## 2023-12-30 DIAGNOSIS — L8990 Pressure ulcer of unspecified site, unspecified stage: Secondary | ICD-10-CM | POA: Diagnosis present

## 2023-12-30 LAB — URINALYSIS, W/ REFLEX TO CULTURE (INFECTION SUSPECTED)
Bilirubin Urine: NEGATIVE
Glucose, UA: NEGATIVE mg/dL
Ketones, ur: NEGATIVE mg/dL
Nitrite: NEGATIVE
Protein, ur: NEGATIVE mg/dL
Specific Gravity, Urine: 1.01 (ref 1.005–1.030)
pH: 5 (ref 5.0–8.0)

## 2023-12-30 LAB — BASIC METABOLIC PANEL
Anion gap: 7 (ref 5–15)
BUN: 93 mg/dL — ABNORMAL HIGH (ref 8–23)
CO2: 32 mmol/L (ref 22–32)
Calcium: 8.7 mg/dL — ABNORMAL LOW (ref 8.9–10.3)
Chloride: 103 mmol/L (ref 98–111)
Creatinine, Ser: 2.44 mg/dL — ABNORMAL HIGH (ref 0.44–1.00)
GFR, Estimated: 19 mL/min — ABNORMAL LOW (ref >60)
Glucose, Bld: 135 mg/dL — ABNORMAL HIGH (ref 70–99)
Potassium: 4.1 mmol/L (ref 3.5–5.1)
Sodium: 142 mmol/L (ref 135–145)

## 2023-12-30 LAB — GLUCOSE, CAPILLARY
Glucose-Capillary: 68 mg/dL — ABNORMAL LOW (ref 70–99)
Glucose-Capillary: 146 mg/dL — ABNORMAL HIGH (ref 70–99)
Glucose-Capillary: 62 mg/dL — ABNORMAL LOW (ref 70–99)
Glucose-Capillary: 48 mg/dL — ABNORMAL LOW (ref 70–99)
Glucose-Capillary: 61 mg/dL — ABNORMAL LOW (ref 70–99)
Glucose-Capillary: 63 mg/dL — ABNORMAL LOW (ref 70–99)
Glucose-Capillary: 87 mg/dL (ref 70–99)
Glucose-Capillary: 63 mg/dL — ABNORMAL LOW (ref 70–99)
Glucose-Capillary: 68 mg/dL — ABNORMAL LOW (ref 70–99)
Glucose-Capillary: 77 mg/dL (ref 70–99)
Glucose-Capillary: 72 mg/dL (ref 70–99)
Glucose-Capillary: 63 mg/dL — ABNORMAL LOW (ref 70–99)
Glucose-Capillary: 118 mg/dL — ABNORMAL HIGH (ref 70–99)
Glucose-Capillary: 60 mg/dL — ABNORMAL LOW (ref 70–99)

## 2023-12-30 LAB — PROTIME-INR
INR: 10.3 (ref 0.8–1.2)
Prothrombin Time: 81.5 s — ABNORMAL HIGH (ref 11.4–15.2)

## 2023-12-30 LAB — CBC
HCT: 29.5 % — ABNORMAL LOW (ref 36.0–46.0)
Hemoglobin: 8.8 g/dL — ABNORMAL LOW (ref 12.0–15.0)
MCH: 29.7 pg (ref 26.0–34.0)
MCHC: 29.8 g/dL — ABNORMAL LOW (ref 30.0–36.0)
MCV: 99.7 fL (ref 80.0–100.0)
Platelets: 209 K/uL (ref 150–400)
RBC: 2.96 MIL/uL — ABNORMAL LOW (ref 3.87–5.11)
RDW: 15.6 % — ABNORMAL HIGH (ref 11.5–15.5)
WBC: 3.9 K/uL — ABNORMAL LOW (ref 4.0–10.5)
nRBC: 0 % (ref 0.0–0.2)

## 2023-12-30 LAB — HEMOGLOBIN A1C
Hgb A1c MFr Bld: 8.3 % — ABNORMAL HIGH (ref 4.8–5.6)
Mean Plasma Glucose: 191.51 mg/dL

## 2023-12-30 LAB — MRSA NEXT GEN BY PCR, NASAL: MRSA by PCR Next Gen: NOT DETECTED

## 2023-12-30 MED ORDER — OMEGA-3-ACID ETHYL ESTERS 1 G PO CAPS
1.0000 g | ORAL_CAPSULE | Freq: Every day | ORAL | Status: DC
  Administered 2023-12-30 – 2023-12-31 (×2): 1 g via ORAL
  Filled 2023-12-30 (×3): qty 1

## 2023-12-30 MED ORDER — GLIPIZIDE ER 5 MG PO TB24
5.0000 mg | ORAL_TABLET | Freq: Two times a day (BID) | ORAL | Status: DC
  Filled 2023-12-30: qty 1

## 2023-12-30 MED ORDER — CO Q 10 100 MG PO CAPS: 100.0000 mg | ORAL_CAPSULE | Freq: Every day | ORAL | Status: DC

## 2023-12-30 MED ORDER — ALBUTEROL SULFATE (2.5 MG/3ML) 0.083% IN NEBU
3.0000 mL | INHALATION_SOLUTION | Freq: Four times a day (QID) | RESPIRATORY_TRACT | Status: DC | PRN
  Administered 2024-01-01: 3 mL via RESPIRATORY_TRACT
  Filled 2023-12-30: qty 3

## 2023-12-30 MED ORDER — ACETAMINOPHEN 325 MG PO TABS: 650.0000 mg | ORAL_TABLET | Freq: Four times a day (QID) | ORAL | Status: DC | PRN

## 2023-12-30 MED ORDER — ACETAMINOPHEN 650 MG RE SUPP: 650.0000 mg | Freq: Four times a day (QID) | RECTAL | Status: DC | PRN

## 2023-12-30 MED ORDER — PIOGLITAZONE HCL 30 MG PO TABS
30.0000 mg | ORAL_TABLET | Freq: Every day | ORAL | Status: DC
  Filled 2023-12-30: qty 1

## 2023-12-30 MED ORDER — VITAMIN D 25 MCG (1000 UNIT) PO TABS
1000.0000 [IU] | ORAL_TABLET | Freq: Every day | ORAL | Status: DC
  Administered 2023-12-30 – 2024-01-01 (×3): 1000 [IU] via ORAL
  Filled 2023-12-30 (×3): qty 1

## 2023-12-30 MED ORDER — VITAMIN E 180 MG (400 UNIT) PO CAPS
400.0000 [IU] | ORAL_CAPSULE | Freq: Every day | ORAL | Status: DC
  Administered 2023-12-30 – 2024-01-01 (×3): 400 [IU] via ORAL
  Filled 2023-12-30 (×5): qty 1

## 2023-12-30 MED ORDER — DEXTROSE 50 % IV SOLN
12.5000 g | INTRAVENOUS | Status: AC
  Administered 2023-12-30: 12.5 g via INTRAVENOUS

## 2023-12-30 MED ORDER — ISOSORBIDE MONONITRATE ER 30 MG PO TB24
30.0000 mg | ORAL_TABLET | Freq: Every day | ORAL | Status: DC
  Administered 2023-12-30 – 2023-12-31 (×2): 30 mg via ORAL
  Filled 2023-12-30 (×3): qty 1

## 2023-12-30 MED ORDER — DEXTROSE 50 % IV SOLN
INTRAVENOUS | Status: AC
  Administered 2023-12-30: 12.5 g via INTRAVENOUS
  Filled 2023-12-30: qty 50

## 2023-12-30 MED ORDER — LORATADINE 10 MG PO TABS
10.0000 mg | ORAL_TABLET | Freq: Every day | ORAL | Status: DC
  Administered 2023-12-30 – 2024-01-01 (×3): 10 mg via ORAL
  Filled 2023-12-30 (×3): qty 1

## 2023-12-30 MED ORDER — B COMPLEX-C PO TABS
1.0000 | ORAL_TABLET | Freq: Every day | ORAL | Status: DC
  Administered 2023-12-30 – 2024-01-01 (×3): 1 via ORAL
  Filled 2023-12-30 (×4): qty 1

## 2023-12-30 MED ORDER — VITAMIN C 500 MG PO TABS
500.0000 mg | ORAL_TABLET | Freq: Every day | ORAL | Status: DC
  Administered 2023-12-30 – 2024-01-01 (×3): 500 mg via ORAL
  Filled 2023-12-30 (×3): qty 1

## 2023-12-30 MED ORDER — ORAL CARE MOUTH RINSE: 15.0000 mL | OROMUCOSAL | Status: DC | PRN

## 2023-12-30 MED ORDER — METHOCARBAMOL 1000 MG/10ML IJ SOLN: 500.0000 mg | Freq: Four times a day (QID) | INTRAMUSCULAR | Status: DC | PRN

## 2023-12-30 MED ORDER — NITROGLYCERIN 0.4 MG SL SUBL: 0.4000 mg | SUBLINGUAL_TABLET | SUBLINGUAL | Status: DC | PRN

## 2023-12-30 MED ORDER — CLOPIDOGREL BISULFATE 75 MG PO TABS
75.0000 mg | ORAL_TABLET | Freq: Every day | ORAL | Status: DC
  Administered 2023-12-30 – 2024-01-01 (×3): 75 mg via ORAL
  Filled 2023-12-30 (×3): qty 1

## 2023-12-30 MED ORDER — CARVEDILOL 12.5 MG PO TABS
12.5000 mg | ORAL_TABLET | Freq: Two times a day (BID) | ORAL | Status: DC
  Filled 2023-12-30 (×2): qty 1

## 2023-12-30 MED ORDER — ATORVASTATIN CALCIUM 40 MG PO TABS
40.0000 mg | ORAL_TABLET | Freq: Every day | ORAL | Status: DC
  Administered 2023-12-30 – 2024-01-01 (×3): 40 mg via ORAL
  Filled 2023-12-30 (×3): qty 1

## 2023-12-30 MED ORDER — POLYETHYLENE GLYCOL 3350 17 G PO PACK: 17.0000 g | PACK | Freq: Every day | ORAL | Status: DC | PRN

## 2023-12-30 MED ORDER — CHLORHEXIDINE GLUCONATE CLOTH 2 % EX PADS
6.0000 | MEDICATED_PAD | Freq: Every day | CUTANEOUS | Status: DC
  Administered 2023-12-30 – 2024-01-01 (×3): 6 via TOPICAL

## 2023-12-30 MED ORDER — MEGARED OMEGA-3 KRILL OIL 500 MG PO CAPS: 1000.0000 mg | ORAL_CAPSULE | Freq: Every day | ORAL | Status: DC

## 2023-12-30 MED ORDER — DEXTROSE 50 % IV SOLN
1.0000 | Freq: Once | INTRAVENOUS | Status: AC
  Administered 2023-12-30: 50 mL via INTRAVENOUS
  Filled 2023-12-30: qty 50

## 2023-12-30 MED ORDER — FUROSEMIDE 10 MG/ML IJ SOLN
40.0000 mg | Freq: Two times a day (BID) | INTRAMUSCULAR | Status: DC
  Administered 2023-12-30 – 2024-01-01 (×5): 40 mg via INTRAVENOUS
  Filled 2023-12-30 (×5): qty 4

## 2023-12-30 MED ORDER — DEXTROSE 10 % IV SOLN
INTRAVENOUS | Status: AC
Start: 2023-12-30 — End: 2023-12-31

## 2023-12-30 MED ORDER — ASCORBIC ACID 125 MG PO CHEW: CHEWABLE_TABLET | Freq: Every day | ORAL | Status: DC

## 2023-12-30 MED ORDER — DEXTROSE 10 % IV SOLN: INTRAVENOUS | Status: AC

## 2023-12-30 MED ORDER — PHYTONADIONE 5 MG PO TABS
5.0000 mg | ORAL_TABLET | Freq: Once | ORAL | Status: AC
  Administered 2023-12-30: 5 mg via ORAL
  Filled 2023-12-30: qty 1

## 2023-12-30 NOTE — Progress Notes (Signed)
 Transported patient from ER to ICU 7 without incident.  ICU RT notified of  patient and report given.

## 2023-12-30 NOTE — Progress Notes (Signed)
PHARMACIST - PHYSICIAN ORDER COMMUNICATION  CONCERNING: P&T Medication Policy on Herbal Medications  DESCRIPTION:  This patient's order for:  Co Q 10  has been noted.  This product(s) is classified as an "herbal" or natural product. Due to a lack of definitive safety studies or FDA approval, nonstandard manufacturing practices, plus the potential risk of unknown drug-drug interactions while on inpatient medications, the Pharmacy and Therapeutics Committee does not permit the use of "herbal" or natural products of this type within [place].   ACTION TAKEN: The pharmacy department is unable to verify this order at this time and your patient has been informed of this safety policy. Please reevaluate patient's clinical condition at discharge and address if the herbal or natural product(s) should be resumed at that time.   

## 2023-12-30 NOTE — Assessment & Plan Note (Signed)
 -  We will continue statin therapy.

## 2023-12-30 NOTE — ED Notes (Signed)
 ED TO INPATIENT HANDOFF REPORT  ED Nurse Name and Phone #: Ephriam Knuckles Medic  S Name/Age/Gender [name] 83 y.o. female Room/Bed: APA15/APA15  Code Status   Code Status: Do not attempt resuscitation (DNR) PRE-ARREST INTERVENTIONS DESIRED  Home/SNF/Other Home Patient oriented to: self and place Is this baseline? Yes   Triage Complete: Triage complete  Chief Complaint Acute respiratory failure with hypoxia and hypercarbia (HCC) [J96.01, J96.02]  Triage Note Daughter called ems due to pt being sob when they arrived she was in the 80s on room air. Pt states she does not feel well, but denies any pain.    Allergies No Known Allergies  Level of Care/Admitting Diagnosis ED Disposition     ED Disposition  Admit   Condition  --   Comment  Hospital Area: [facility] [[id number]]  Level of Care: Stepdown [14]  Covid Evaluation: Asymptomatic - no recent exposure (last 10 days) testing not required  Diagnosis: Acute respiratory failure with hypoxia and hypercarbia [facility]) [[id number]]  Admitting Physician: Hannah Beat [[id number]]  Attending Physician: Hannah Beat [[id number]]  Certification:: I certify this patient will need inpatient services for at least 2 midnights  Expected Medical Readiness: 12/31/2023          B Medical/Surgery History Past Medical History:  Diagnosis Date   Arthritis    Atrial flutter (HCC)    a. 06/2013 s/p RFCA.   Bilateral pulmonary embolism (HCC) 08/2008   a. Chronic coumadin.   Carotid arterial disease (HCC)    a. 03/2017 Carotid angio: RICA 15-20%, LICA 30-35%.   CHF (congestive heart failure) (HCC)    CKD (chronic kidney disease), stage III (HCC)    Coronary artery disease    a. 05/2005 Inf STEMI/PCI: severe LAD/LCX/RCA dzs->CABG advised, pt preferred PCI-->Cypher DES to mRCA & mLAD; b. 02/2008 PCI: ISR of RCA->Cypher DES. LAD patent; c. 07/2020 Inf STEMI/PCI: LM 30d, LAD 55m, D1 80(small), RI 75, LCX 80ost->distal (small), RCA 25p/m,  185m/d (3.0x26 Resolute Onyx DES).   Essential hypertension    HFrEF (heart failure with reduced ejection fraction) (HCC)    a. 07/2020 Echo: EF 45-50% in setting of inf STEMI; b. 01/2021 Echo: EF 40-45%, glob HK, sev basal-septal LVH w/ grII DD. Nl RV fxn, RVSP 37.36mmHg, sev dil LA, mild MR, mod AS.   Hyperlipidemia    Ischemic cardiomyopathy    a. 07/2020 Echo: EF 45-50%; b. 01/2021 Echo: EF 40-45%.   Memory disorder 07/08/2017   Moderate aortic stenosis    a. 01/2021 Echo: Mild-mod AS w/ mean grad . AoV area 2.8m/s (Vmax). In setting of LV dysfxn, AS likely more moderate.   Myocardial infarction [facility])    Stroke (HCC)    Type 2 diabetes mellitus (HCC)    Vascular dementia [facility])    Past Surgical History:  Procedure Laterality Date   ATRIAL FLUTTER ABLATION N/A 06/17/2013   Procedure: ATRIAL FLUTTER ABLATION;  Surgeon: Hillis Range, MD;  Location: [facility] CATH LAB;  Service: Cardiovascular;  Laterality: N/A;   CAROTID PTA/STENT INTERVENTION N/A 04/10/2017   Procedure: Carotid PTA/Stent Intervention;  Surgeon: Annice Needy, MD;  Location: [facility] INVASIVE CV LAB;  Service: Cardiovascular;  Laterality: N/A;   CHOLECYSTECTOMY     CORONARY STENT INTERVENTION N/A 07/14/2020   Procedure: CORONARY STENT INTERVENTION;  Surgeon: Tonny Bollman, MD;  Location: [facility] INVASIVE CV LAB;  Service: Cardiovascular;  Laterality: N/A;   HIP SURGERY     KNEE ARTHROSCOPY WITH MEDIAL MENISECTOMY Left 09/25/2017   Procedure:  KNEE ARTHROSCOPY WITH MEDIAL AND LATERAL MENISECTOMY;  Surgeon: Vickki Hearing, MD;  Location: [facility] ORS;  Service: Orthopedics;  Laterality: Left;   LEFT HEART CATH AND CORONARY ANGIOGRAPHY N/A 07/14/2020   Procedure: LEFT HEART CATH AND CORONARY ANGIOGRAPHY;  Surgeon: Tonny Bollman, MD;  Location: [facility] INVASIVE CV LAB;  Service: Cardiovascular;  Laterality: N/A;   RIGHT/LEFT HEART CATH AND CORONARY ANGIOGRAPHY N/A 02/05/2021   Procedure: RIGHT/LEFT HEART CATH AND CORONARY ANGIOGRAPHY;  Surgeon:  Runell Gess, MD;  Location: [facility] INVASIVE CV LAB;  Service: Cardiovascular;  Laterality: N/A;     A IV Location/Drains/Wounds Patient Lines/Drains/Airways Status     Active Line/Drains/Airways     Name Placement date Placement time Site Days   Peripheral IV 12/29/23 20 G 1" Anterior;Left Forearm 12/29/23  1740  Forearm  1   Peripheral IV 12/29/23 20 G 1" Anterior;Right Forearm 12/29/23  1648  Forearm  1   Pressure Injury 02/02/21 Coccyx Right;Left;Medial Stage 1 -  Intact skin with non-blanchable redness of a localized area usually over a bony prominence. 02/02/21  2000  -- 1061   Wound / Incision (Open or Dehisced) 02/02/21 Skin tear Arm Left;Lower;Posterior skin tear LFA 02/02/21  2000  Arm  1061            Intake/Output Last 24 hours  Intake/Output Summary (Last 24 hours) at 12/30/2023 0046 Last data filed at 12/30/2023 0002 Gross per 24 hour  Intake 350 ml  Output 250 ml  Net 100 ml    Labs/Imaging Results for orders placed or performed during the hospital encounter of 12/29/23 (from the past 48 hours)  Comprehensive metabolic panel     Status: Abnormal   Collection Time: 12/29/23  4:45 PM  Result Value Ref Range   Sodium 139 135 - 145 mmol/L   Potassium 4.4 3.5 - 5.1 mmol/L   Chloride 98 98 - 111 mmol/L   CO2 31 22 - 32 mmol/L   Glucose, Bld 223 (H) 70 - 99 mg/dL    Comment: Glucose reference range applies only to samples taken after fasting for at least 8 hours.   BUN 93 (H) 8 - 23 mg/dL   Creatinine, Ser 1.61 (H) 0.44 - 1.00 mg/dL   Calcium 9.4 8.9 - 09.6 mg/dL   Total Protein 6.8 6.5 - 8.1 g/dL   Albumin 3.4 (L) 3.5 - 5.0 g/dL   AST 22 15 - 41 U/L   ALT 24 0 - 44 U/L   Alkaline Phosphatase 56 38 - 126 U/L   Total Bilirubin 1.3 (H) 0.0 - 1.2 mg/dL   GFR, Estimated 17 (L) >60 mL/min    Comment: (NOTE) Calculated using the CKD-EPI Creatinine Equation (2021)    Anion gap 10 5 - 15    Comment: Performed at [facility], [address] [place], [place]  [address]  CBC with Differential     Status: Abnormal   Collection Time: 12/29/23  4:45 PM  Result Value Ref Range   WBC 4.5 4.0 - 10.5 K/uL   RBC 3.26 (L) 3.87 - 5.11 MIL/uL   Hemoglobin 10.2 (L) 12.0 - 15.0 g/dL   HCT 98.1 (L) 19.1 - 47.8 %   MCV 99.7 80.0 - 100.0 fL   MCH 31.3 26.0 - 34.0 pg   MCHC 31.4 30.0 - 36.0 g/dL   RDW 29.5 (H) 62.1 - 30.8 %   Platelets 230 150 - 400 K/uL   nRBC 0.0 0.0 - 0.2 %   Neutrophils Relative %  71 %   Neutro Abs 3.3 1.7 - 7.7 K/uL   Lymphocytes Relative 17 %   Lymphs Abs 0.8 0.7 - 4.0 K/uL   Monocytes Relative 10 %   Monocytes Absolute 0.4 0.1 - 1.0 K/uL   Eosinophils Relative 0 %   Eosinophils Absolute 0.0 0.0 - 0.5 K/uL   Basophils Relative 1 %   Basophils Absolute 0.0 0.0 - 0.1 K/uL   Immature Granulocytes 1 %   Abs Immature Granulocytes 0.03 0.00 - 0.07 K/uL    Comment: Performed at [facility], [address] [place], [place] [address]  Protime-INR     Status: Abnormal   Collection Time: 12/29/23  4:45 PM  Result Value Ref Range   Prothrombin Time 61.2 (H) 11.4 - 15.2 seconds   INR 7.1 (HH) 0.8 - 1.2    Comment: REPEATED TO VERIFY CRITICAL RESULT CALLED TO, READ BACK BY AND VERIFIED WITH: D AVERY AT 1729 ON [id number] BY S DALTON Performed at [facility], [address] [place], [place] [address]   APTT     Status: Abnormal   Collection Time: 12/29/23  4:45 PM  Result Value Ref Range   aPTT 58 (H) 24 - 36 seconds    Comment:        IF BASELINE aPTT IS ELEVATED, SUGGEST PATIENT RISK ASSESSMENT BE USED TO DETERMINE APPROPRIATE ANTICOAGULANT THERAPY. Performed at [facility] [place], [address] [place], [place] [address]   Brain natriuretic peptide     Status: Abnormal   Collection Time: 12/29/23  4:45 PM  Result Value Ref Range   B Natriuretic Peptide 686.0 (H) 0.0 - 100.0 pg/mL    Comment: Performed at [facility], [address], [place], [place] [address]  Urinalysis, w/ Reflex to Culture (Infection Suspected) -Urine, Clean Catch      Status: Abnormal   Collection Time: 12/29/23  4:49 PM  Result Value Ref Range   Specimen Source URINE, CLEAN CATCH    Color, Urine YELLOW YELLOW   APPearance CLEAR CLEAR   Specific Gravity, Urine 1.010 1.005 - 1.030   pH 5.0 5.0 - 8.0   Glucose, UA NEGATIVE NEGATIVE mg/dL   Hgb urine dipstick SMALL (A) NEGATIVE   Bilirubin Urine NEGATIVE NEGATIVE   Ketones, ur NEGATIVE NEGATIVE mg/dL   Protein, ur NEGATIVE NEGATIVE mg/dL   Nitrite NEGATIVE NEGATIVE   Leukocytes,Ua SMALL (A) NEGATIVE   RBC / HPF 0-5 0 - 5 RBC/hpf   WBC, UA 21-50 0 - 5 WBC/hpf    Comment:        Reflex urine culture not performed if WBC <=10, OR if Squamous epithelial cells >5. If Squamous epithelial cells >5 suggest recollection.    Bacteria, UA FEW (A) NONE SEEN   Squamous Epithelial / HPF 0-5 0 - 5 /HPF   WBC Clumps PRESENT    Mucus PRESENT    Hyaline Casts, UA PRESENT     Comment: Performed at [facility], [address], [place], [place] [address]  Resp panel by RT-PCR (RSV, Flu A&B, Covid) Anterior Nasal Swab     Status: None   Collection Time: 12/29/23  5:06 PM   Specimen: Anterior Nasal Swab  Result Value Ref Range   SARS Coronavirus 2 by RT PCR NEGATIVE NEGATIVE    Comment: (NOTE) SARS-CoV-2 target nucleic acids are NOT DETECTED.  The SARS-CoV-2 RNA is generally detectable in upper respiratory specimens during the acute phase of infection. The lowest concentration of SARS-CoV-2 viral copies this assay can detect  is 138 copies/mL. A negative result does not preclude SARS-Cov-2 infection and should not be used as the sole basis for treatment or other patient management decisions. A negative result may occur with  improper specimen collection/handling, submission of specimen other than nasopharyngeal swab, presence of viral mutation(s) within the areas targeted by this assay, and inadequate number of viral copies(<138 copies/mL). A negative result must be combined with clinical observations,  patient history, and epidemiological information. The expected result is Negative.  Fact Sheet for Patients:  BloggerCourse.com  Fact Sheet for Healthcare Providers:  SeriousBroker.it  This test is no t yet approved or cleared by the [place] FDA and  has been authorized for detection and/or diagnosis of SARS-CoV-2 by FDA under an Emergency Use Authorization (EUA). This EUA will remain  in effect (meaning this test can be used) for the duration of the COVID-19 declaration under Section 564(b)(1) of the Act, 21 U.S.C.section 360bbb-3(b)(1), unless the authorization is terminated  or revoked sooner.       Influenza A by PCR NEGATIVE NEGATIVE   Influenza B by PCR NEGATIVE NEGATIVE    Comment: (NOTE) The Xpert Xpress SARS-CoV-2/FLU/RSV plus assay is intended as an aid in the diagnosis of influenza from Nasopharyngeal swab specimens and should not be used as a sole basis for treatment. Nasal washings and aspirates are unacceptable for Xpert Xpress SARS-CoV-2/FLU/RSV testing.  Fact Sheet for Patients: BloggerCourse.com  Fact Sheet for Healthcare Providers: SeriousBroker.it  This test is not yet approved or cleared by the [place] FDA and has been authorized for detection and/or diagnosis of SARS-CoV-2 by FDA under an Emergency Use Authorization (EUA). This EUA will remain in effect (meaning this test can be used) for the duration of the COVID-19 declaration under Section 564(b)(1) of the Act, 21 U.S.C. section 360bbb-3(b)(1), unless the authorization is terminated or revoked.     Resp Syncytial Virus by PCR NEGATIVE NEGATIVE    Comment: (NOTE) Fact Sheet for Patients: BloggerCourse.com  Fact Sheet for Healthcare Providers: SeriousBroker.it  This test is not yet approved or cleared by the [place] FDA and has  been authorized for detection and/or diagnosis of SARS-CoV-2 by FDA under an Emergency Use Authorization (EUA). This EUA will remain in effect (meaning this test can be used) for the duration of the COVID-19 declaration under Section 564(b)(1) of the Act, 21 U.S.C. section 360bbb-3(b)(1), unless the authorization is terminated or revoked.  Performed at [facility], [address] [place], [place] [address]   Lactic acid, plasma     Status: None   Collection Time: 12/29/23  5:26 PM  Result Value Ref Range   Lactic Acid, Venous 1.1 0.5 - 1.9 mmol/L    Comment: Performed at [facility], [address], [place], [place] [address]  Blood Culture (routine x 2)     Status: None (Preliminary result)   Collection Time: 12/29/23  5:26 PM   Specimen: Blood  Result Value Ref Range   Specimen Description BLOOD BLOOD LEFT FOREARM    Special Requests      BOTTLES DRAWN AEROBIC ONLY Blood Culture results may not be optimal due to an inadequate volume of blood received in culture bottles Performed at [facility], [address], [place], [place] [address]    Culture PENDING    Report Status PENDING   Blood Culture (routine x 2)     Status: None (Preliminary result)   Collection Time: 12/29/23  5:26 PM   Specimen: Blood  Result Value Ref Range  Specimen Description BLOOD BLOOD RIGHT FOREARM    Special Requests      BOTTLES DRAWN AEROBIC AND ANAEROBIC Blood Culture results may not be optimal due to an inadequate volume of blood received in culture bottles Performed at [facility], [address] [place], [place] [address]    Culture PENDING    Report Status PENDING   Blood gas, arterial (at [facility])     Status: Abnormal   Collection Time: 12/29/23  8:03 PM  Result Value Ref Range   pH, Arterial 7.36 7.35 - 7.45   pCO2 arterial 60 (H) 32 - 48 mmHg   pO2, Arterial 58 (L) 83 - 108 mmHg   Bicarbonate 33.9 (H) 20.0 - 28.0 mmol/L   Acid-Base Excess 7.2 (H) 0.0 - 2.0 mmol/L   O2 Saturation 92.4  %   Patient temperature 36.8    Collection site RIGHT BRACHIAL    Drawn by [id number]    Allens test (pass/fail) PASS PASS    Comment: Performed at [facility], [address], [place], [place] [address]  Blood gas, arterial (at [facility])     Status: Abnormal   Collection Time: 12/29/23 10:00 PM  Result Value Ref Range   pH, Arterial 7.4 7.35 - 7.45   pCO2 arterial 57 (H) 32 - 48 mmHg   pO2, Arterial 89 83 - 108 mmHg   Bicarbonate 35.3 (H) 20.0 - 28.0 mmol/L   Acid-Base Excess 8.5 (H) 0.0 - 2.0 mmol/L   O2 Saturation 99 %   Patient temperature 37.0    Collection site BRACHIAL ARTERY    Drawn by [id number]    Allens test (pass/fail) PASS PASS    Comment: Performed at [facility], [address] [place], [place] [address]   DG Chest Port 1 View Result Date: 12/29/2023 CLINICAL DATA:  Sepsis. EXAM: PORTABLE CHEST 1 VIEW COMPARISON:  Chest radiograph dated 10/10/2021. FINDINGS: No focal consolidation, pleural effusion, pneumothorax. Stable cardiomegaly. Atherosclerotic calcification of the aorta. Osteopenia with degenerative changes of the spine. No acute osseous pathology. IMPRESSION: 1. No active disease. 2. Cardiomegaly. Electronically Signed   By: Elgie Collard M.D.   On: 12/29/2023 17:49    Pending Labs Unresulted Labs (From admission, onward)     Start     Ordered   12/30/23 0500  Basic metabolic panel  Tomorrow morning,   R        12/30/23 0001   12/30/23 0500  CBC  Tomorrow morning,   R        12/30/23 0001   12/30/23 0500  Protime-INR  Daily,   R      12/30/23 0001   12/29/23 1649  Urine Culture  Once,   R        12/29/23 1649            Vitals/Pain Today's Vitals   12/29/23 2305 12/29/23 2315 12/29/23 2345 12/30/23 0000  BP: (!) 101/48 118/74 115/72 (!) 106/56  Pulse:  72 77 80  Resp:  12 13 14   Temp:      TempSrc:      SpO2:  99% 99% 99%  Weight:      Height:      PainSc:        Isolation Precautions No active isolations  Medications Medications   atorvastatin (LIPITOR) tablet 40 mg (has no administration in time range)  carvedilol (COREG) tablet 12.5 mg (0 mg Oral Hold 12/30/23 0027)  nitroGLYCERIN (NITROSTAT) SL tablet 0.4 mg (  has no administration in time range)  isosorbide mononitrate (IMDUR) 24 hr tablet 30 mg (has no administration in time range)  glipiZIDE (GLUCOTROL XL) 24 hr tablet 5 mg (has no administration in time range)  clopidogrel (PLAVIX) tablet 75 mg (has no administration in time range)  pioglitazone (ACTOS) tablet 30 mg (has no administration in time range)  B-complex with vitamin C tablet 1 tablet (has no administration in time range)  cholecalciferol (VITAMIN D3) 25 MCG (1000 UNIT) tablet 1,000 Units (has no administration in time range)  vitamin E capsule 400 Units (has no administration in time range)  albuterol (PROVENTIL) (2.5 MG/3ML) 0.083% nebulizer solution 3 mL (has no administration in time range)  loratadine (CLARITIN) tablet 10 mg (has no administration in time range)  furosemide (LASIX) injection 40 mg (has no administration in time range)  acetaminophen (TYLENOL) tablet 650 mg (has no administration in time range)    Or  acetaminophen (TYLENOL) suppository 650 mg (has no administration in time range)  methocarbamol (ROBAXIN) injection 500 mg (has no administration in time range)  polyethylene glycol (MIRALAX / GLYCOLAX) packet 17 g (has no administration in time range)  omega-3 acid ethyl esters (LOVAZA) capsule 1 g (has no administration in time range)  ascorbic acid (VITAMIN C) tablet 500 mg (has no administration in time range)  cefTRIAXone (ROCEPHIN) 2 g in sodium chloride 0.9 % 100 mL IVPB (0 g Intravenous Stopped 12/29/23 1830)  azithromycin (ZITHROMAX) 500 mg in sodium chloride 0.9 % 250 mL IVPB (0 mg Intravenous Stopped 12/29/23 1851)  furosemide (LASIX) injection 40 mg (40 mg Intravenous Given 12/29/23 2046)  ipratropium-albuterol (DUONEB) 0.5-2.5 (3) MG/3ML nebulizer solution 3 mL (3 mLs  Nebulization Given 12/29/23 2119)    Mobility walks with device     Focused Assessments    R Recommendations: See Admitting Provider Note  Report given to:   Additional Notes:

## 2023-12-30 NOTE — Plan of Care (Signed)
  Problem: Education: Goal: Knowledge of General Education information will improve Description: Including pain rating scale, medication(s)/side effects and non-pharmacologic comfort measures Outcome: Progressing   Problem: Health Behavior/Discharge Planning: Goal: Ability to manage health-related needs will improve Outcome: Progressing   Problem: Clinical Measurements: Goal: Ability to maintain clinical measurements within normal limits will improve Outcome: Progressing Goal: Will remain free from infection Outcome: Progressing Goal: Diagnostic test results will improve Outcome: Progressing Goal: Respiratory complications will improve Outcome: Progressing Goal: Cardiovascular complication will be avoided Outcome: Not Progressing   Problem: Activity: Goal: Risk for activity intolerance will decrease Outcome: Not Progressing   Problem: Nutrition: Goal: Adequate nutrition will be maintained Outcome: Progressing   Problem: Coping: Goal: Level of anxiety will decrease Outcome: Progressing   Problem: Elimination: Goal: Will not experience complications related to bowel motility Outcome: Progressing Goal: Will not experience complications related to urinary retention Outcome: Progressing   Problem: Pain Managment: Goal: General experience of comfort will improve and/or be controlled Outcome: Progressing   Problem: Safety: Goal: Ability to remain free from injury will improve Outcome: Progressing   Problem: Skin Integrity: Goal: Risk for impaired skin integrity will decrease Outcome: Progressing   Problem: Education: Goal: Ability to demonstrate management of disease process will improve Outcome: Progressing Goal: Ability to verbalize understanding of medication therapies will improve Outcome: Progressing Goal: Individualized Educational Video(s) Outcome: Progressing   Problem: Activity: Goal: Capacity to carry out activities will improve Outcome: Not  Progressing   Problem: Cardiac: Goal: Ability to achieve and maintain adequate cardiopulmonary perfusion will improve Outcome: Not Progressing

## 2023-12-30 NOTE — Inpatient Diabetes Management (Signed)
 Inpatient Diabetes Program Recommendations  AACE/ADA: New Consensus Statement on Inpatient Glycemic Control (2015)  Target Ranges:  Prepandial:   less than 140 mg/dL      Peak postprandial:   less than 180 mg/dL (1-2 hours)      Critically ill patients:  140 - 180 mg/dL   Lab Results  Component Value Date   GLUCAP 87 12/30/2023   HGBA1C 8.1 (H) 01/31/2021    Review of Glycemic Control  Diabetes history: DM 2 Outpatient Diabetes medications: Glipizide 5 mg bid, Actos 30 mg Daily, Ozempic 0.25 mg weekly Current orders for Inpatient glycemic control:  D 10 35 ml/hour  Note pt from home on sulfonylurea medication, experiencing hypoglycemia on D 10 gtt. Also note pt on TZD outpt and in with possible CHF.   Inpatient Diabetes Program Recommendations:    -   consider discontinuing Actos outpatient and follow up with provider.  Thanks,  Christena Deem RN, MSN, BC-ADM Inpatient Diabetes Coordinator Team Pager [phone] (8a-5p)

## 2023-12-30 NOTE — Assessment & Plan Note (Signed)
-   We will continue antihypertensive therapy.

## 2023-12-30 NOTE — Progress Notes (Signed)
 Patient is doing well off BIPAP and has been all day. VSS and O2 sats are 97% on 2 lpm. Told patient to let us know if her breathing got bad and we could put her back on machine if needed but MD order is for PRN only. Will monitor throughout the night.

## 2023-12-30 NOTE — Assessment & Plan Note (Signed)
-   The patient will be placed on supplemental coverage with NovoLog. - We will continue Actos. - We will continue glipizide XL.

## 2023-12-30 NOTE — Assessment & Plan Note (Signed)
-   The patient will be diuresed with IV Lasix. - Most recent 2D echo revealed an EF of 55-60% with mild LVH and indeterminate left ventricular diastolic parameters.

## 2023-12-30 NOTE — Assessment & Plan Note (Signed)
-   This like secondary to acute possibly diastolic CHF. - The patient will be admitted to a stepdown unit bed. - We will continue her on BiPAP. - Will follow ABG. - Management otherwise as below.

## 2023-12-30 NOTE — TOC Initial Note (Signed)
 Transition of Care [facility]) - Initial/Assessment Note    Patient Details  Name: [name] MRN: [id number] Date of Birth: 04/14/1941  Transition of Care [facility]) CM/SW Contact:    Karn Cassis, LCSW Phone Number: 12/30/2023, 8:54 AM  Clinical Narrative:  Pt admitted due to acute respiratory failure with hypoxia. TOC consulted for CHF screening. Pt's daughter reports pt lives alone. Daughter or grandson stay with pt at night. She is fairly independent with ADLs. [facility]. Daughter reports they were discussing home health with PCP prior to admission, but it had not been set up yet. Daughter requests HHPT/RN with [facility]. Referred and accepted by Art[place] with [facility]. Will need orders.   CHF screening completed. Pt does not weigh herself daily. Pt's daughter plans to help pt do this daily. Daughter cooks most meals for pt and follows heart healthy diet. She takes medications as prescribed. TOC will follow.         Expected Discharge Plan: Home w Home Health Services Barriers to Discharge: Continued Medical Work up   Patient Goals and CMS Choice Patient states their goals for this hospitalization and ongoing recovery are:: return home   Choice offered to / list presented to : Adult Children [place] ownership interest in [facility].provided to::  (n/a)    Expected Discharge Plan and Services In-house Referral: Clinical Social Work   Post Acute Care Choice: Home Health Living arrangements for the past 2 months: Single Family Home                           HH Arranged: RN, PT HH Agency: [facility] Date HH Agency Contacted: 12/30/23 Time HH Agency Contacted: [phone] Representative spoke with at [facility] Agency: Adele Dan  Prior Living Arrangements/Services Living arrangements for the past 2 months: Single Family Home Lives with:: Self Patient language and need for interpreter reviewed:: Yes Do you feel safe going back  to the place where you live?: Yes      Need for Family Participation in Patient Care: Yes (Comment) Care giver support system in place?: Yes (comment) Current home services: DME ([facility]) Criminal Activity/Legal Involvement Pertinent to Current Situation/Hospitalization: No - Comment as needed  Activities of Daily Living   ADL Screening (condition at time of admission) Independently performs ADLs?: No Is the patient deaf or have difficulty hearing?: No Does the patient have difficulty seeing, even when wearing glasses/contacts?: No Does the patient have difficulty concentrating, remembering, or making decisions?: No  Permission Sought/Granted                  Emotional Assessment         Alcohol / Substance Use: Not Applicable Psych Involvement: No (comment)  Admission diagnosis:  Acute respiratory failure with hypercapnia (HCC) [J96.02] Acute respiratory failure with hypoxia and hypercarbia (HCC) [J96.01, J96.02] Acute congestive heart failure, unspecified heart failure type (HCC) [I50.9] Patient Active Problem List   Diagnosis Date Noted   Acute diastolic CHF (congestive heart failure) (HCC) 12/30/2023   Type 2 diabetes mellitus with chronic kidney disease, without long-term current use of insulin (HCC) 12/30/2023   Dyslipidemia 12/30/2023   Pressure injury of skin 12/30/2023   Acute respiratory failure with hypoxia and hypercarbia (HCC) 12/29/2023   Pericardial effusion 11/11/2022   History of pulmonary embolism 11/11/2022   Cardiomyopathy (HCC) 11/11/2022   Hypertensive emergency    Acute on chronic congestive heart  failure (HCC) 01/30/2021   Systolic and diastolic CHF, chronic (HCC) 11/28/2020   Acute respiratory failure due to COVID-19 [facility]) 11/10/2020   Hyperlipidemia    Acute ST elevation myocardial infarction (STEMI) involving right coronary artery (HCC)    Acute heart failure (HCC) 07/14/2020   Acute coronary syndrome (HCC)    Acute pulmonary edema (HCC)     Respiratory failure (HCC)    NSTEMI (non-ST elevated myocardial infarction) (HCC)    S/P left knee arthroscopy 09/25/17 10/02/2017   Derangement of posterior horn of medial meniscus of left knee    Derangement of posterior horn of lateral meniscus of left knee    Primary osteoarthritis of left knee    Memory disorder 07/08/2017   Carotid artery stenosis 04/08/2017   Left arm numbness 03/25/2017   Chest pain 03/25/2017   DM type 2 (diabetes mellitus, type 2) (HCC) 03/25/2017   CKD (chronic kidney disease), stage III (HCC) 03/25/2017   Long term (current) use of anticoagulants [Z79.01] 04/22/2016   Facial droop    Encounter for therapeutic drug monitoring 12/01/2013   Atrial flutter (HCC) 06/09/2013   Essential hypertension 06/09/2013   CAD (coronary artery disease) 06/09/2013   CLOSED FRACTURE OF SURGICAL NECK OF HUMERUS 04/12/2010   HIP, ARTHRITIS, DEGEN./OSTEO 03/01/2009   HIP PAIN 03/01/2009   PCP:  Roe [name], NP Pharmacy:   [facility] [address] [place] - [address] [place] [address] Phone: [phone] Fax: [phone]  [facility] - [place], [place] - [address] [place] [address] Phone: [phone] Fax: [phone]     Social Drivers of Health (SDOH) Social History: SDOH Screenings   Food Insecurity: Patient Declined (12/30/2023)  Housing: Unknown (12/30/2023)  Transportation Needs: No Transportation Needs (12/30/2023)  Utilities: Not At Risk (12/30/2023)  Financial Resource Strain: Low Risk  (04/04/2023)   Received from [facility], [facility]  Physical Activity: Unknown (04/04/2023)   Received from [facility], [facility]  Social Connections: Moderately Integrated (12/30/2023)  Stress: No Stress Concern Present (04/04/2023)   Received from [facility], [facility]  Tobacco Use: Medium Risk (12/29/2023)   SDOH Interventions:     Readmission Risk  Interventions     No data to display

## 2023-12-30 NOTE — Assessment & Plan Note (Signed)
-   She is currently with supratherapeutic INR on Coumadin. - We will hold off Coumadin. - We will follow INR.

## 2023-12-30 NOTE — Hospital Course (Signed)
 83 y.o. female with medical history significant for osteoarthritis, atrial flutter, bilateral PE on Coumadin, CHF, stage III chronic kidney disease, coronary artery disease, essential hypertension, HFrEF, type 2 diabetes mellitus vascular dementia and CVA, dyslipidemia and ischemic and myopathy, who presented to the ER with acute onset of worsening dyspnea with associated orthopnea without paroxysmal nocturnal dyspnea.  She has been having dyspnea on exertion.  No fever or chills.  No nausea or vomiting or abdominal pain.  She noted neck erythematous rash without itching.  No dysuria, oliguria or hematuria or flank pain.  Pt was admitted with acute heart failure and recurrent hypoglycemia from glipizide in the setting of chronic kidney disease.

## 2023-12-30 NOTE — Progress Notes (Signed)
 Patient is currently off BIPAP. On 3 lpm nasal cannula resting comfortably. VSS lung sounds clear but decreased. Family at bedside. Patient able to easily arouse. BIPAP on standby.

## 2023-12-30 NOTE — H&P (Signed)
 [place]   PATIENT NAME: [name]    MR#:  [id number]  DATE OF BIRTH:  August 06, 1941  DATE OF ADMISSION:  12/29/2023  PRIMARY CARE PHYSICIAN: Roe [name], NP   Patient is coming from: Home  REQUESTING/REFERRING PHYSICIAN: Eber Hong, MD  CHIEF COMPLAINT:   Chief Complaint  Patient presents with   Shortness of Breath    HISTORY OF PRESENT ILLNESS:  [name] is a 83 y.o. Caucasian female with medical history significant for osteoarthritis, atrial flutter, bilateral PE on Coumadin, CHF, stage III chronic kidney disease, coronary artery disease, essential hypertension, HFrEF, type 2 diabetes mellitus vascular dementia and CVA, dyslipidemia and ischemic and myopathy, who presented to the ER with acute onset of worsening dyspnea with associated orthopnea without paroxysmal nocturnal dyspnea.  She has been having dyspnea on exertion.  No fever or chills.  No nausea or vomiting or abdominal pain.  She noted neck erythematous rash without itching.  No dysuria, oliguria or hematuria or flank pain.  ED Course: When she came to the ER, with respiratory rate was 22 and pulse oximetry was 88 and later 80% on room air with otherwise unremarkable vital signs.  Pulse oximetry is 93% on 2 L of O2 by nasal cannula and was later on placed on BiPAP at 40% with pulse symmetry of 96 to 99%.  Labs revealed ABG with pH of 7.36 and pCO2 of 60 with a pO2 of 58 and HCO3 of 33.9%.  Later on ABG showed pH 7.4 with pCO2 of 57 and pO2 of 89 with HCO3 of 35.3 with O2 sat of 99%. CMP revealed a blood glucose of 223 with BUN of 93 and creatinine 2.67 with albumin 3.4 and total bili 1.3.  BNP was 686 and CBC showed hemoglobin 10.2 hematocrit 32.5.  UA was remarkable for 21-50 WBCs with 0-5 RBCs few bacteria and negative nitrite.  Urine culture was sent.  EKG as reviewed by me : EKG showed atrial fibrillation with controlled ventricular response of 94 with nonspecific intraventricular conduction  delay and antero- septal Q waves. Imaging:  Portable chest x-ray showed cardiomegaly with no acute cardiopulmonary disease.  The patient was given 40 mg of IV Lasix, DuoNebs, IV Rocephin and Zithromax.  She will be admitted to a stepdown unit bed for further evaluation and management. PAST MEDICAL HISTORY:   Past Medical History:  Diagnosis Date   Arthritis    Atrial flutter (HCC)    a. 06/2013 s/p RFCA.   Bilateral pulmonary embolism (HCC) 08/2008   a. Chronic coumadin.   Carotid arterial disease (HCC)    a. 03/2017 Carotid angio: RICA 15-20%, LICA 30-35%.   CHF (congestive heart failure) (HCC)    CKD (chronic kidney disease), stage III (HCC)    Coronary artery disease    a. 05/2005 Inf STEMI/PCI: severe LAD/LCX/RCA dzs->CABG advised, pt preferred PCI-->Cypher DES to mRCA & mLAD; b. 02/2008 PCI: ISR of RCA->Cypher DES. LAD patent; c. 07/2020 Inf STEMI/PCI: LM 30d, LAD 51m, D1 80(small), RI 75, LCX 80ost->distal (small), RCA 25p/m, 153m/d (3.0x26 Resolute Onyx DES).   Essential hypertension    HFrEF (heart failure with reduced ejection fraction) (HCC)    a. 07/2020 Echo: EF 45-50% in setting of inf STEMI; b. 01/2021 Echo: EF 40-45%, glob HK, sev basal-septal LVH w/ grII DD. Nl RV fxn, RVSP 37.47mmHg, sev dil LA, mild MR, mod AS.   Hyperlipidemia    Ischemic cardiomyopathy    a. 07/2020 Echo: EF 45-50%;  b. 01/2021 Echo: EF 40-45%.   Memory disorder 07/08/2017   Moderate aortic stenosis    a. 01/2021 Echo: Mild-mod AS w/ mean grad . AoV area 2.26m/s (Vmax). In setting of LV dysfxn, AS likely more moderate.   Myocardial infarction [facility])    Stroke (HCC)    Type 2 diabetes mellitus (HCC)    Vascular dementia (HCC)     PAST SURGICAL HISTORY:   Past Surgical History:  Procedure Laterality Date   ATRIAL FLUTTER ABLATION N/A 06/17/2013   Procedure: ATRIAL FLUTTER ABLATION;  Surgeon: Hillis Range, MD;  Location: [facility] CATH LAB;  Service: Cardiovascular;  Laterality: N/A;   CAROTID PTA/STENT  INTERVENTION N/A 04/10/2017   Procedure: Carotid PTA/Stent Intervention;  Surgeon: Annice Needy, MD;  Location: [facility] INVASIVE CV LAB;  Service: Cardiovascular;  Laterality: N/A;   CHOLECYSTECTOMY     CORONARY STENT INTERVENTION N/A 07/14/2020   Procedure: CORONARY STENT INTERVENTION;  Surgeon: Tonny Bollman, MD;  Location: [facility] INVASIVE CV LAB;  Service: Cardiovascular;  Laterality: N/A;   HIP SURGERY     KNEE ARTHROSCOPY WITH MEDIAL MENISECTOMY Left 09/25/2017   Procedure: KNEE ARTHROSCOPY WITH MEDIAL AND LATERAL MENISECTOMY;  Surgeon: Vickki Hearing, MD;  Location: [facility] ORS;  Service: Orthopedics;  Laterality: Left;   LEFT HEART CATH AND CORONARY ANGIOGRAPHY N/A 07/14/2020   Procedure: LEFT HEART CATH AND CORONARY ANGIOGRAPHY;  Surgeon: Tonny Bollman, MD;  Location: [facility] INVASIVE CV LAB;  Service: Cardiovascular;  Laterality: N/A;   RIGHT/LEFT HEART CATH AND CORONARY ANGIOGRAPHY N/A 02/05/2021   Procedure: RIGHT/LEFT HEART CATH AND CORONARY ANGIOGRAPHY;  Surgeon: Runell Gess, MD;  Location: [facility] INVASIVE CV LAB;  Service: Cardiovascular;  Laterality: N/A;    SOCIAL HISTORY:   Social History   Tobacco Use   Smoking status: Former    Current packs/day: 0.00    Types: Cigarettes    Start date: 10/14/1968    Quit date: 10/14/1978    Years since quitting: 45.2   Smokeless tobacco: Never  Substance Use Topics   Alcohol use: No    FAMILY HISTORY:   Family History  Problem Relation Age of Onset   Heart disease Other    Arthritis Other    Cancer Other    Diabetes Other    Heart failure Mother     DRUG ALLERGIES:  No Known Allergies  REVIEW OF SYSTEMS:   ROS As per history of present illness. All pertinent systems were reviewed above. Constitutional, HEENT, cardiovascular, respiratory, GI, GU, musculoskeletal, neuro, psychiatric, endocrine, integumentary and hematologic systems were reviewed and are otherwise negative/unremarkable except for positive findings mentioned above in  the HPI.   MEDICATIONS AT HOME:   Prior to Admission medications   Medication Sig Start Date End Date Taking? Authorizing Provider  albuterol (VENTOLIN HFA) 108 (90 Base) MCG/ACT inhaler Inhale 2 puffs into the lungs every 6 (six) hours as needed for wheezing or shortness of breath. 11/11/20   Sherryll Burger, Pratik D, DO  ALPRAZolam Prudy Feeler) 0.5 MG tablet Take 0.5 mg by mouth 3 (three) times daily as needed for anxiety or sleep. 12/26/23   [provider]  Ascorbic Acid (VITAMIN C GUMMIES PO) Take 2 each by mouth daily. 180 mg    [provider]  atorvastatin (LIPITOR) 40 MG tablet Take 1 tablet (40 mg total) by mouth daily. 02/07/21 11/30/96  Zigmund Daniel., MD  b complex vitamins capsule Take 1 capsule by mouth daily.    [provider]  carvedilol (COREG) 12.5  MG tablet Take 1 tablet by mouth twice daily 02/05/23   Iran Ouch, [place] M, PA-C  cetirizine (ZYRTEC) 10 MG tablet Take by mouth at bedtime. 05/09/23   [provider]  cholecalciferol (VITAMIN D3) 25 MCG (1000 UNIT) tablet Take 1,000 Units by mouth daily.    [provider]  clopidogrel (PLAVIX) 75 MG tablet Take 1 tablet by mouth once daily with breakfast 07/04/23   Jonelle Sidle, MD  Coenzyme Q10 (CO Q 10) 100 MG CAPS Take 100 mg by mouth daily.    [provider]  furosemide (LASIX) 20 MG tablet Take 40 mg am and 20 mg pm 12/09/23   Jonelle Sidle, MD  glipiZIDE (GLUCOTROL XL) 5 MG 24 hr tablet Take 5 mg by mouth in the morning and at bedtime. 09/12/20   [provider]  isosorbide mononitrate (IMDUR) 30 MG 24 hr tablet Take 1 tablet by mouth once daily 08/28/23   Jonelle Sidle, MD  [facility] OMEGA-3 KRILL OIL PO Take 1,000 mg by mouth daily. When she remembers    [provider]  nitroGLYCERIN (NITROSTAT) 0.4 MG SL tablet Place 1 tablet (0.4 mg total) under the tongue every 5 (five) minutes x 3 doses as needed for chest pain. 08/03/20   Netta Neat.,  NP  pioglitazone (ACTOS) 30 MG tablet Take by mouth daily. 01/03/23   [provider]  Polyethyl Glycol-Propyl Glycol (SYSTANE OP) Apply 1 drop to eye 3 (three) times daily as needed (dry eyes).    [provider]  Semaglutide,0.25 or 0.5MG /DOS, (OZEMPIC, 0.25 OR 0.5 MG/DOSE,) 2 MG/1.5ML SOPN Inject 0.25 mg into the skin once a week.    [provider]  vitamin E 180 MG (400 UNITS) capsule Take 400 Units by mouth daily.    [provider]  warfarin (COUMADIN) 5 MG tablet Take 5 mg by mouth every evening. Except 2.5 mg on Sunday & Wednesday 05/31/18   [provider]      VITAL SIGNS:  Blood pressure (!) 98/39, pulse 71, temperature 97.7 F (36.5 C), temperature source Oral, resp. rate 11, height 5\' 5"  (1.651 m), weight 85.9 kg, SpO2 98%.  PHYSICAL EXAMINATION:  Physical Exam  GENERAL: Acutely ill 83 y.o.-year-old patient lying in the bed with mild respiratory distress on BiPAP with conversational dyspnea EYES: Pupils equal, round, reactive to light and accommodation. No scleral icterus. Extraocular muscles intact.  HEENT: Head atraumatic, normocephalic. Oropharynx and nasopharynx clear.  NECK:  Supple, no jugular venous distention. No thyroid enlargement, no tenderness.  LUNGS: Diminished bibasilar breath sounds with bibasal rales.. No use of accessory muscles of respiration.  CARDIOVASCULAR: Regular rate and rhythm, S1, S2 normal. No murmurs, rubs, or gallops.  ABDOMEN: Soft, nondistended, nontender. Bowel sounds present. No organomegaly or mass.  EXTREMITIES: 1+ bilateral lower extremity pitting edema, with no cyanosis, or clubbing.  NEUROLOGIC: Cranial nerves II through XII are intact. Muscle strength 5/5 in all extremities. Sensation intact. Gait not checked.  PSYCHIATRIC: The patient is alert and oriented x 3.  Normal affect and good eye contact. SKIN: No obvious rash, lesion, or ulcer.   LABORATORY PANEL:   CBC Recent Labs  Lab  12/30/23 0425  WBC 3.9*  HGB 8.8*  HCT 29.5*  PLT 209   ------------------------------------------------------------------------------------------------------------------  Chemistries  Recent Labs  Lab 12/29/23 1645 12/30/23 0425  NA 139 142  K 4.4 4.1  CL 98 103  CO2 31 32  GLUCOSE 223* 135*  BUN 93* 93*  CREATININE 2.67* 2.44*  CALCIUM 9.4 8.7*  AST 22  --   ALT 24  --   ALKPHOS 56  --   BILITOT 1.3*  --    ------------------------------------------------------------------------------------------------------------------  Cardiac Enzymes No results for input(s): "TROPONINI" in the last 168 hours. ------------------------------------------------------------------------------------------------------------------  RADIOLOGY:  DG Chest Port 1 View Result Date: 12/29/2023 CLINICAL DATA:  Sepsis. EXAM: PORTABLE CHEST 1 VIEW COMPARISON:  Chest radiograph dated 10/10/2021. FINDINGS: No focal consolidation, pleural effusion, pneumothorax. Stable cardiomegaly. Atherosclerotic calcification of the aorta. Osteopenia with degenerative changes of the spine. No acute osseous pathology. IMPRESSION: 1. No active disease. 2. Cardiomegaly. Electronically Signed   By: Elgie Collard M.D.   On: 12/29/2023 17:49      IMPRESSION AND PLAN:  Assessment and Plan: * Acute respiratory failure with hypoxia and hypercarbia (HCC) - This like secondary to acute possibly diastolic CHF. - The patient will be admitted to a stepdown unit bed. - We will continue her on BiPAP. - Will follow ABG. - Management otherwise as below.  Dyslipidemia - We will continue statin therapy.  Type 2 diabetes mellitus with chronic kidney disease, without long-term current use of insulin (HCC) - The patient will be placed on supplemental coverage with NovoLog. - We will continue Actos. - We will continue glipizide XL.  Acute diastolic CHF (congestive heart failure) (HCC) - The patient will be diuresed with IV  Lasix. - Most recent 2D echo revealed an EF of 55-60% with mild LVH and indeterminate left ventricular diastolic parameters.  History of pulmonary embolism - She is currently with supratherapeutic INR on Coumadin. - We will hold off Coumadin. - We will follow INR.  Essential hypertension - We will continue antihypertensive therapy.    DVT prophylaxis: SCDs. Advanced Care Planning:  Code Status: full code the patient is DNR only. Family Communication:  The plan of care was discussed in details with the patient (and family). I answered all questions. The patient agreed to proceed with the above mentioned plan. Further management will depend upon hospital course. Disposition Plan: Back to previous home environment Consults called: none. All the records are reviewed and case discussed with ED provider.  Status is: Inpatient  At the time of the admission, it appears that the appropriate admission status for this patient is inpatient.  This is judged to be reasonable and necessary in order to provide the required intensity of service to ensure the patient's safety given the presenting symptoms, physical exam findings and initial radiographic and laboratory data in the context of comorbid conditions.  The patient requires inpatient status due to high intensity of service, high risk of further deterioration and high frequency of surveillance required.  I certify that at the time of admission, it is my clinical judgment that the patient will require inpatient hospital care extending more than 2 midnights.                            Dispo: The patient is from: Home              Anticipated d/c is to: Home              Patient currently is not medically stable to d/c.              Difficult to place patient: No Authorized and performed by: Valente David, MD Total critical care time: 55      minutes. Due to a high probability of  clinically significant, life-threatening deterioration, the patient  required my highest level of preparedness to intervene emergently and I personally spent this critical care time directly and personally managing the patient.  This critical care time included obtaining a history, examining the patient, pulse oximetry, ordering and review of studies, arranging urgent treatment with development of management plan, evaluation of patient's response to treatment, frequent reassessment, and discussions with other providers. This critical care time was performed to assess and manage the high probability of imminent, life-threatening deterioration that could result in multiorgan failure.  It was exclusive of separately billable procedures and treating other patients and teaching time.   Hannah Beat M.D on 12/30/2023 at 6:39 AM  [facility]   From 7 PM-7 AM, contact night-coverage [email or web address]  CC: Primary care physician; Roe [name], NP

## 2023-12-30 NOTE — Progress Notes (Addendum)
 PROGRESS NOTE   [name]  HYQ:[id number] DOB: [birth year] DOA: 12/29/2023 PCP: Roe [name], NP   Chief Complaint  Patient presents with   Shortness of Breath   Level of care: Stepdown  Brief Admission History:  83 y.o. female with medical history significant for osteoarthritis, atrial flutter, bilateral PE on Coumadin, CHF, stage III chronic kidney disease, coronary artery disease, essential hypertension, HFrEF, type 2 diabetes mellitus vascular dementia and CVA, dyslipidemia and ischemic and myopathy, who presented to the ER with acute onset of worsening dyspnea with associated orthopnea without paroxysmal nocturnal dyspnea.  She has been having dyspnea on exertion.  No fever or chills.  No nausea or vomiting or abdominal pain.  She noted neck erythematous rash without itching.  No dysuria, oliguria or hematuria or flank pain.  Pt was admitted with acute heart failure and recurrent hypoglycemia from glipizide in the setting of chronic kidney disease.     Assessment and Plan:  Acute respiratory failure with hypoxia and hypercarbia  - Secondary to presumed acute diastolic CHF. - continue her on BiPAP but adjust order to PRN only - Follow VBG.  Acute HFpEF -- IV furosemide as ordered -- requested ReDs vest reading -- monitoring I/O,weight  Intake/Output Summary (Last 24 hours) at 12/30/2023 1855 Last data filed at 12/30/2023 1806 Gross per 24 hour  Intake 212.88 ml  Output 850 ml  Net -637.12 ml   Filed Weights   12/29/23 1630 12/30/23 0152 12/30/23 0800  Weight: 85 kg 85.9 kg 85.9 kg    Dyslipidemia - atorvastatin 40 mg daily   Type 2 diabetes mellitus with chronic kidney disease, without long-term current use of insulin  - The patient will be placed on supplemental coverage with NovoLog. - DISCONTINUE Actos  - DISCONTINUE glipizide XL.  Prolonged Hypoglycemia -- we had to start patient on a dextrose 10% infusion and run it for over 8 hours -- pt had taken her  glipizide XL yesterday -- I strongly recommend that she be taken off sulfonylureas given her advanced age and CKD -- continue frequent CBG monitoring until hypoglycemia is over  -- also recommend she be taken off actos due to heart failure although the effect of the medication will linger for 2 weeks after it has been discontinued  Hypoglycemic unawareness -- this leads me to think that she likely is having frequent hypoglycemia at home and no longer getting the normal adrenergic physiologic response to low blood sugar that is critical for counterregulation, that is why we are discontinuing the glipizide XL   Acute HFpEF  - continue IV Lasix. - Most recent 2D echo revealed an EF of 55-60% with mild LVH and indeterminate left ventricular diastolic parameters. -- updated TTE still pending  History of pulmonary embolism - She is currently with supratherapeutic INR on Coumadin. - We will hold off Coumadin. - We will follow INR.  Supratherapeutic INR -- INR rising from admission, now up to 10 -- vitamin K 5 mg given x 1 dose on 3/18 because there was no active bleeding -- follow daily PT/INR -- monitor for bleeding complications  Normocytic anemia -- Hg down to 8.8 today -- no signs of bleeding found -- daily CBC ordered -- hemoccult to stool -- anemia panel pending  Essential hypertension - We will continue antihypertensive therapy.  DVT prophylaxis: SCDs Code Status: DNR  Family Communication: daughters at bedside  Disposition: TBD   Consultants:   Procedures:   Antimicrobials:    Subjective: Pt is eating and  drinking, not symptomatic from low blood sugars. SOB seems to be present but has been diuresing on IV lasix treatments.   Objective: Vitals:   12/30/23 1600 12/30/23 1629 12/30/23 1700 12/30/23 1800  BP: (!) 101/35  (!) 100/51 (!) 100/58  Pulse: 81  88 84  Resp: 17  20 (!) 9  Temp:  98 F (36.7 C)    TempSrc:  Oral    SpO2: 97%  97% 94%  Weight:       Height:       Intake/Output Summary (Last 24 hours) at 12/30/2023 1847 Last data filed at 12/30/2023 1806 Gross per 24 hour  Intake 462.88 ml  Output 850 ml  Net -387.12 ml   Filed Weights   12/29/23 1630 12/30/23 0152 12/30/23 0800  Weight: 85 kg 85.9 kg 85.9 kg   Examination:  General exam: awake,alert, cooperative, Appears calm and comfortable  Respiratory system: bibasilar crackles heard.  Cardiovascular system: normal S1 & S2 heard. No JVD, murmurs, rubs, gallops or clicks. No pedal edema. Gastrointestinal system: Abdomen is nondistended, soft and nontender. No organomegaly or masses felt. Normal bowel sounds heard. Central nervous system: Alert and oriented but with some confusion. No focal neurological deficits. Extremities: Symmetric 5 x 5 power. Skin: stage 2 pressure sacrum. Psychiatry: Judgement and insight appear diminished. Mood & affect appropriate.  Pressure Injury 12/30/23 Sacrum Lower Stage 2 -  Partial thickness loss of dermis presenting as a shallow open injury with a red, pink wound bed without slough. (Active)  12/30/23 0200  Location: Sacrum  Location Orientation: Lower  Staging: Stage 2 -  Partial thickness loss of dermis presenting as a shallow open injury with a red, pink wound bed without slough.  Wound Description (Comments):   Present on Admission: Yes   Data Reviewed: I have personally reviewed following labs and imaging studies  CBC: Recent Labs  Lab 12/29/23 1645 12/30/23 0425  WBC 4.5 3.9*  NEUTROABS 3.3  --   HGB 10.2* 8.8*  HCT 32.5* 29.5*  MCV 99.7 99.7  PLT 230 209    Basic Metabolic Panel: Recent Labs  Lab 12/29/23 1645 12/30/23 0425  NA 139 142  K 4.4 4.1  CL 98 103  CO2 31 32  GLUCOSE 223* 135*  BUN 93* 93*  CREATININE 2.67* 2.44*  CALCIUM 9.4 8.7*    CBG: Recent Labs  Lab 12/30/23 0945 12/30/23 1132 12/30/23 1220 12/30/23 1352 12/30/23 1610  GLUCAP 87 63* 68* 77 72    Recent Results (from the past 240  hours)  Resp panel by RT-PCR (RSV, Flu A&B, Covid) Anterior Nasal Swab     Status: None   Collection Time: 12/29/23  5:06 PM   Specimen: Anterior Nasal Swab  Result Value Ref Range Status   SARS Coronavirus 2 by RT PCR NEGATIVE NEGATIVE Final    Comment: (NOTE) SARS-CoV-2 target nucleic acids are NOT DETECTED.  The SARS-CoV-2 RNA is generally detectable in upper respiratory specimens during the acute phase of infection. The lowest concentration of SARS-CoV-2 viral copies this assay can detect is 138 copies/mL. A negative result does not preclude SARS-Cov-2 infection and should not be used as the sole basis for treatment or other patient management decisions. A negative result may occur with  improper specimen collection/handling, submission of specimen other than nasopharyngeal swab, presence of viral mutation(s) within the areas targeted by this assay, and inadequate number of viral copies(<138 copies/mL). A negative result must be combined with clinical observations, patient history, and epidemiological  information. The expected result is Negative.  Fact Sheet for Patients:  BloggerCourse.com  Fact Sheet for Healthcare Providers:  SeriousBroker.it  This test is no t yet approved or cleared by the [place] FDA and  has been authorized for detection and/or diagnosis of SARS-CoV-2 by FDA under an Emergency Use Authorization (EUA). This EUA will remain  in effect (meaning this test can be used) for the duration of the COVID-19 declaration under Section 564(b)(1) of the Act, 21 U.S.C.section 360bbb-3(b)(1), unless the authorization is terminated  or revoked sooner.       Influenza A by PCR NEGATIVE NEGATIVE Final   Influenza B by PCR NEGATIVE NEGATIVE Final    Comment: (NOTE) The Xpert Xpress SARS-CoV-2/FLU/RSV plus assay is intended as an aid in the diagnosis of influenza from Nasopharyngeal swab specimens and should not  be used as a sole basis for treatment. Nasal washings and aspirates are unacceptable for Xpert Xpress SARS-CoV-2/FLU/RSV testing.  Fact Sheet for Patients: BloggerCourse.com  Fact Sheet for Healthcare Providers: SeriousBroker.it  This test is not yet approved or cleared by the [place] FDA and has been authorized for detection and/or diagnosis of SARS-CoV-2 by FDA under an Emergency Use Authorization (EUA). This EUA will remain in effect (meaning this test can be used) for the duration of the COVID-19 declaration under Section 564(b)(1) of the Act, 21 U.S.C. section 360bbb-3(b)(1), unless the authorization is terminated or revoked.     Resp Syncytial Virus by PCR NEGATIVE NEGATIVE Final    Comment: (NOTE) Fact Sheet for Patients: BloggerCourse.com  Fact Sheet for Healthcare Providers: SeriousBroker.it  This test is not yet approved or cleared by the [place] FDA and has been authorized for detection and/or diagnosis of SARS-CoV-2 by FDA under an Emergency Use Authorization (EUA). This EUA will remain in effect (meaning this test can be used) for the duration of the COVID-19 declaration under Section 564(b)(1) of the Act, 21 U.S.C. section 360bbb-3(b)(1), unless the authorization is terminated or revoked.  Performed at [facility], [address], [place], [place] [address]   Blood Culture (routine x 2)     Status: None (Preliminary result)   Collection Time: 12/29/23  5:26 PM   Specimen: BLOOD  Result Value Ref Range Status   Specimen Description BLOOD BLOOD LEFT FOREARM  Final   Special Requests   Final    BOTTLES DRAWN AEROBIC ONLY Blood Culture results may not be optimal due to an inadequate volume of blood received in culture bottles   Culture   Final    NO GROWTH < 12 HOURS Performed at [facility], [address] [place], [place] [address]    Report Status  PENDING  Incomplete  Blood Culture (routine x 2)     Status: None (Preliminary result)   Collection Time: 12/29/23  5:26 PM   Specimen: BLOOD  Result Value Ref Range Status   Specimen Description BLOOD BLOOD RIGHT FOREARM  Final   Special Requests   Final    BOTTLES DRAWN AEROBIC AND ANAEROBIC Blood Culture results may not be optimal due to an inadequate volume of blood received in culture bottles   Culture   Final    NO GROWTH < 12 HOURS Performed at [facility], [address] [place], [place] [address]    Report Status PENDING  Incomplete  MRSA Next Gen by PCR, Nasal     Status: None   Collection Time: 12/30/23  2:18 AM   Specimen: Nasal Mucosa; Nasal Swab  Result  Value Ref Range Status   MRSA by PCR Next Gen NOT DETECTED NOT DETECTED Final    Comment: (NOTE) The GeneXpert MRSA Assay (FDA approved for NASAL specimens only), is one component of a comprehensive MRSA colonization surveillance program. It is not intended to diagnose MRSA infection nor to guide or monitor treatment for MRSA infections. Test performance is not FDA approved in patients less than 58 years old. Performed at [facility], [address], [place], [place] [address]      Radiology Studies: [facility] Chest [facility] 1 View Result Date: 12/29/2023 CLINICAL DATA:  Sepsis. EXAM: PORTABLE CHEST 1 VIEW COMPARISON:  Chest radiograph dated 10/10/2021. FINDINGS: No focal consolidation, pleural effusion, pneumothorax. Stable cardiomegaly. Atherosclerotic calcification of the aorta. Osteopenia with degenerative changes of the spine. No acute osseous pathology. IMPRESSION: 1. No active disease. 2. Cardiomegaly. Electronically Signed   By: Elgie Collard M.D.   On: 12/29/2023 17:49    Scheduled Meds:  ascorbic acid  500 mg Oral Daily   atorvastatin  40 mg Oral Daily   B-complex with vitamin C  1 tablet Oral Daily   carvedilol  12.5 mg Oral BID   Chlorhexidine Gluconate Cloth  6 each Topical Daily   cholecalciferol  1,000  Units Oral Daily   clopidogrel  75 mg Oral Q breakfast   furosemide  40 mg Intravenous Q12H   isosorbide mononitrate  30 mg Oral Daily   loratadine  10 mg Oral Daily   omega-3 acid ethyl esters  1 g Oral Daily   vitamin E  400 Units Oral Daily   Continuous Infusions:  dextrose       LOS: 1 day   Time : 54 mins   Levaughn Puccinelli Laural Benes, MD How to contact the [facility] Attending or Consulting provider 7A - 7P or covering provider during after hours 7P -7A, for this patient?  Check the care team in [facility] and look for a) attending/consulting [facility] provider listed and b) the [facility] team listed Log into [email or web address] to find provider on call.  Locate the [facility] provider you are looking for under [facility] and page to a number that you can be directly reached. If you still have difficulty reaching the provider, please page the [facility] (Director on Call) for the Hospitalists listed on amion for assistance.  12/30/2023, 6:47 PM

## 2023-12-31 ENCOUNTER — Other Ambulatory Visit (HOSPITAL_COMMUNITY)

## 2023-12-31 ENCOUNTER — Inpatient Hospital Stay (HOSPITAL_COMMUNITY)

## 2023-12-31 ENCOUNTER — Other Ambulatory Visit (HOSPITAL_COMMUNITY): Payer: Self-pay | Admitting: *Deleted

## 2023-12-31 DIAGNOSIS — E162 Hypoglycemia, unspecified: Secondary | ICD-10-CM | POA: Diagnosis not present

## 2023-12-31 DIAGNOSIS — I5033 Acute on chronic diastolic (congestive) heart failure: Secondary | ICD-10-CM | POA: Diagnosis not present

## 2023-12-31 DIAGNOSIS — I10 Essential (primary) hypertension: Secondary | ICD-10-CM | POA: Diagnosis not present

## 2023-12-31 DIAGNOSIS — J9601 Acute respiratory failure with hypoxia: Secondary | ICD-10-CM | POA: Diagnosis not present

## 2023-12-31 LAB — RETICULOCYTES
Immature Retic Fract: 27.1 % — ABNORMAL HIGH (ref 2.3–15.9)
RBC.: 2.96 MIL/uL — ABNORMAL LOW (ref 3.87–5.11)
Retic Count, Absolute: 67.2 10*3/uL (ref 19.0–186.0)
Retic Ct Pct: 2.3 % (ref 0.4–3.1)

## 2023-12-31 LAB — RESPIRATORY PANEL BY PCR

## 2023-12-31 LAB — GLUCOSE, CAPILLARY
Glucose-Capillary: 104 mg/dL — ABNORMAL HIGH (ref 70–99)
Glucose-Capillary: 111 mg/dL — ABNORMAL HIGH (ref 70–99)
Glucose-Capillary: 122 mg/dL — ABNORMAL HIGH (ref 70–99)
Glucose-Capillary: 144 mg/dL — ABNORMAL HIGH (ref 70–99)
Glucose-Capillary: 48 mg/dL — ABNORMAL LOW (ref 70–99)
Glucose-Capillary: 51 mg/dL — ABNORMAL LOW (ref 70–99)
Glucose-Capillary: 83 mg/dL (ref 70–99)
Glucose-Capillary: 97 mg/dL (ref 70–99)

## 2023-12-31 LAB — CBC
HCT: 29.6 % — ABNORMAL LOW (ref 36.0–46.0)
Hemoglobin: 9 g/dL — ABNORMAL LOW (ref 12.0–15.0)
MCH: 30.2 pg (ref 26.0–34.0)
MCHC: 30.4 g/dL (ref 30.0–36.0)
MCV: 99.3 fL (ref 80.0–100.0)
Platelets: 237 10*3/uL (ref 150–400)
RBC: 2.98 MIL/uL — ABNORMAL LOW (ref 3.87–5.11)
RDW: 15.8 % — ABNORMAL HIGH (ref 11.5–15.5)
WBC: 4.5 10*3/uL (ref 4.0–10.5)
nRBC: 0 % (ref 0.0–0.2)

## 2023-12-31 LAB — BASIC METABOLIC PANEL
Anion gap: 10 (ref 5–15)
BUN: 92 mg/dL — ABNORMAL HIGH (ref 8–23)
CO2: 31 mmol/L (ref 22–32)
Calcium: 8.5 mg/dL — ABNORMAL LOW (ref 8.9–10.3)
Chloride: 101 mmol/L (ref 98–111)
Creatinine, Ser: 2.63 mg/dL — ABNORMAL HIGH (ref 0.44–1.00)
GFR, Estimated: 18 mL/min — ABNORMAL LOW (ref >60)
Glucose, Bld: 71 mg/dL (ref 70–99)
Potassium: 4 mmol/L (ref 3.5–5.1)
Sodium: 142 mmol/L (ref 135–145)

## 2023-12-31 LAB — URINE CULTURE: Culture: <10000 — AB

## 2023-12-31 LAB — BLOOD GAS, VENOUS
Acid-Base Excess: 10.1 mmol/L — ABNORMAL HIGH (ref 0.0–2.0)
Bicarbonate: 36.5 mmol/L — ABNORMAL HIGH (ref 20.0–28.0)
Drawn by: 4237
O2 Saturation: 83.2 %
Patient temperature: 37.4
pCO2, Ven: 56 mmHg (ref 44–60)
pH, Ven: 7.42 (ref 7.25–7.43)
pO2, Ven: 47 mmHg — ABNORMAL HIGH (ref 32–45)

## 2023-12-31 LAB — PROCALCITONIN: Procalcitonin: <0.1 ng/mL

## 2023-12-31 LAB — FOLATE: Folate: 10.6 ng/mL (ref >5.9)

## 2023-12-31 LAB — VITAMIN B12: Vitamin B-12: 605 pg/mL (ref 180–914)

## 2023-12-31 LAB — MAGNESIUM: Magnesium: 2.5 mg/dL — ABNORMAL HIGH (ref 1.7–2.4)

## 2023-12-31 LAB — IRON AND TIBC
Iron: 47 ug/dL (ref 28–170)
Saturation Ratios: 14 % (ref 10.4–31.8)
TIBC: 344 ug/dL (ref 250–450)
UIBC: 297 ug/dL

## 2023-12-31 LAB — TSH: TSH: 3.927 u[IU]/mL (ref 0.350–4.500)

## 2023-12-31 LAB — T4, FREE: Free T4: 1.1 ng/dL (ref 0.61–1.12)

## 2023-12-31 LAB — PROTIME-INR
INR: 3.9 — ABNORMAL HIGH (ref 0.8–1.2)
Prothrombin Time: 38.4 s — ABNORMAL HIGH (ref 11.4–15.2)

## 2023-12-31 LAB — FERRITIN: Ferritin: 29 ng/mL (ref 11–307)

## 2023-12-31 LAB — CORTISOL-AM, BLOOD: Cortisol - AM: 15.3 ug/dL (ref 6.7–22.6)

## 2023-12-31 LAB — BRAIN NATRIURETIC PEPTIDE: B Natriuretic Peptide: 580 pg/mL — ABNORMAL HIGH (ref 0.0–100.0)

## 2023-12-31 MED ORDER — DEXTROSE 50 % IV SOLN
25.0000 g | INTRAVENOUS | Status: AC
  Administered 2023-12-31: 25 g via INTRAVENOUS

## 2023-12-31 MED ORDER — SODIUM CHLORIDE 0.9 % IV BOLUS
250.0000 mL | Freq: Once | INTRAVENOUS | Status: AC
  Administered 2023-12-31: 250 mL via INTRAVENOUS

## 2023-12-31 MED ORDER — MELATONIN 3 MG PO TABS
6.0000 mg | ORAL_TABLET | Freq: Once | ORAL | Status: AC
  Administered 2023-12-31: 6 mg via ORAL
  Filled 2023-12-31: qty 2

## 2023-12-31 MED ORDER — DEXTROSE 50 % IV SOLN
INTRAVENOUS | Status: AC
  Administered 2023-12-31: 25 g via INTRAVENOUS
  Filled 2023-12-31: qty 50

## 2023-12-31 NOTE — Evaluation (Signed)
 Physical Therapy Evaluation Patient Details Name: [name] MRN: [id number] DOB: [birth year] Today's Date: 12/31/2023  History of Present Illness  83 y.o. female with medical history significant for osteoarthritis, atrial flutter, bilateral PE on Coumadin, CHF, stage III chronic kidney disease, coronary artery disease, essential hypertension, HFrEF, type 2 diabetes mellitus vascular dementia and CVA, dyslipidemia and ischemic and myopathy, who presented to the ER with acute onset of worsening dyspnea with associated orthopnea without paroxysmal nocturnal dyspnea.  She has been having dyspnea on exertion.  No fever or chills.  No nausea or vomiting or abdominal pain.  She noted neck erythematous rash without itching.  No dysuria, oliguria or hematuria or flank pain.  Pt was admitted with acute heart failure and recurrent hypoglycemia from glipizide in the setting of chronic kidney disease.  Clinical Impression  Pt reports independence in PLOF w/ use of RW and no assist for tfers to and from bathroom/gait in home. Reports she had someone at home to help with cooking/cleaning (daughter/son). Reports owning BSC, RW and ramp able to navigate home with use as well as used recliner for sleeping. Currently demonstrates min A for tfers sitting from supine as well as sit to stand, gait in room short distance (fwd, bwd, lateral) and required cues for safety with tfers. Recommend continued skilled PT services for improving functional mobility independence as was in PLOF and recommend use of HHPT services upon d/c. Fall risk at this time and left in bed with call button in reach.         If plan is discharge home, recommend the following: A lot of help with bathing/dressing/bathroom;A lot of help with walking and/or transfers;Assistance with cooking/housework;Help with stairs or ramp for entrance   Can travel by private vehicle        Equipment Recommendations None recommended by PT  Recommendations for  Other Services       Functional Status Assessment Patient has had a recent decline in their functional status and demonstrates the ability to make significant improvements in function in a reasonable and predictable amount of time.     Precautions / Restrictions Precautions Precautions: Fall Restrictions Weight Bearing Restrictions Per Provider Order: No      Mobility  Bed Mobility Overal bed mobility: Needs Assistance Bed Mobility: Supine to Sit, Sit to Supine     Supine to sit: Min assist Sit to supine: Min assist   General bed mobility comments: use of elevated HOB and hrails for support; mod cues    Transfers Overall transfer level: Needs assistance Equipment used: Rolling walker (2 wheels) Transfers: Sit to/from Stand, Bed to chair/wheelchair/BSC Sit to Stand: Contact guard assist, Min assist   Step pivot transfers: Contact guard assist       General transfer comment: cues for safety with reaching back to tfer surface    Ambulation/Gait Ambulation/Gait assistance: Contact guard assist Gait Distance (Feet): 10 Feet Assistive device: Rolling walker (2 wheels)   Gait velocity: decreased     General Gait Details: requires UE support for safety and balance  Stairs            Wheelchair Mobility     Tilt Bed    Modified Rankin (Stroke Patients Only)       Balance Overall balance assessment: Needs assistance   Sitting balance-Leahy Scale: Normal     Standing balance support: Bilateral upper extremity supported Standing balance-Leahy Scale: Poor  Pertinent Vitals/Pain Pain Assessment Pain Assessment: No/denies pain    Home Living Family/patient expects to be discharged to:: Private residence Living Arrangements: Children Available Help at Discharge: Family;Available 24 hours/day (states son works as Cabin crew on swing shift and daughter comes when he is gone) Type of Home: House Home Access: Ramped  entrance       Home Layout: One level Home Equipment: Agricultural consultant (2 wheels) Additional Comments: uses RW throughout house; sleeps in recliner    Prior Function Prior Level of Function : Needs assist;Independent/Modified Independent       Physical Assist : ADLs (physical)   ADLs (physical): IADLs Mobility Comments: uses RW at baseline w/ assist for IADL including cleaning, cooking, housework       Extremity/Trunk Assessment   Upper Extremity Assessment Upper Extremity Assessment: Defer to OT evaluation    Lower Extremity Assessment Lower Extremity Assessment: Overall WFL for tasks assessed;Generalized weakness       Communication   Communication Communication: No apparent difficulties    Cognition Arousal: Alert Behavior During Therapy: WFL for tasks assessed/performed   PT - Cognitive impairments: Orientation   Orientation impairments: Place, Situation                   PT - Cognition Comments: inconsistency with place (states she is in [place]) Following commands: Intact       Cueing Cueing Techniques: Verbal cues     General Comments      Exercises     Assessment/Plan    PT Assessment Patient needs continued PT services  PT Problem List Decreased strength;Decreased coordination;Decreased activity tolerance;Decreased balance;Decreased mobility       PT Treatment Interventions Gait training;Balance training;Neuromuscular re-education;Functional mobility training;Therapeutic activities;Therapeutic exercise    PT Goals (Current goals can be found in the Care Plan section)  Acute Rehab PT Goals Patient Stated Goal: "Go home" PT Goal Formulation: With patient Time For Goal Achievement: 01/14/24 Potential to Achieve Goals: Good    Frequency Min 3X/week     Co-evaluation               AM-PAC PT "6 Clicks" Mobility  Outcome Measure Help needed turning from your back to your side while in a flat bed without using bedrails?: A  Little Help needed moving from lying on your back to sitting on the side of a flat bed without using bedrails?: A Little Help needed moving to and from a bed to a chair (including a wheelchair)?: A Little Help needed standing up from a chair using your arms (e.g., wheelchair or bedside chair)?: A Little Help needed to walk in hospital room?: A Little Help needed climbing 3-5 steps with a railing? : A Lot 6 Click Score: 17    End of Session Equipment Utilized During Treatment: Gait belt;Oxygen Activity Tolerance: Patient tolerated treatment well;Patient limited by fatigue Patient left: in bed;with SCD's reapplied;with call bell/phone within reach Nurse Communication: Mobility status PT Visit Diagnosis: Unsteadiness on feet (R26.81);Other abnormalities of gait and mobility (R26.89);Muscle weakness (generalized) (M62.81);Difficulty in walking, not elsewhere classified (R26.2)    Time: 1610-9604 PT Time Calculation (min) (ACUTE ONLY): 20 min   Charges:   PT Evaluation $PT Eval Moderate Complexity: 1 Mod   PT General Charges $$ ACUTE PT VISIT: 1 Visit         Placido Sou PT, DPT [facility] [place] [phone] office   Dalbert Mayotte Grayland Daisey 12/31/2023, 11:12 AM

## 2023-12-31 NOTE — Plan of Care (Signed)
  Problem: Acute Rehab PT Goals(only PT should resolve) Goal: Pt Will Go Supine/Side To Sit Flowsheets (Taken 12/31/2023 1109) Pt will go Supine/Side to Sit: Independently Goal: Pt Will Transfer Bed To Chair/Chair To Bed Flowsheets (Taken 12/31/2023 1109) Pt will Transfer Bed to Chair/Chair to Bed: Independently Note: W/ RW Goal: Pt Will Ambulate Flowsheets (Taken 12/31/2023 1109) Pt will Ambulate:  100 feet  with supervision  with rolling walker    Placido Sou PT, DPT [facility] [place] [phone] office

## 2023-12-31 NOTE — Progress Notes (Signed)
 PROGRESS NOTE  [name] QIO:[id number] DOB: 1940-12-08 DOA: 12/29/2023 PCP: Roe [name], NP  Brief History:  83 y.o. female with medical history significant for osteoarthritis, atrial flutter, bilateral PE on Coumadin, CHF, stage III chronic kidney disease, coronary artery disease, essential hypertension, HFrEF, type 2 diabetes mellitus vascular dementia and CVA, dyslipidemia and ischemic and myopathy, who presented to the ER with acute onset of worsening dyspnea with associated orthopnea without paroxysmal nocturnal dyspnea.  She has been having dyspnea on exertion.  No fever or chills.  No nausea or vomiting or abdominal pain.  She noted neck erythematous rash without itching.  No dysuria, oliguria or hematuria or flank pain.  Pt was admitted with acute heart failure and recurrent hypoglycemia from glipizide in the setting of chronic kidney disease.     Assessment/Plan: Acute respiratory failure with hypoxia and hypercarbia -Secondary to CHF -Initially required PAP -Currently on 2 L -Wean oxygen as tolerated -12/31/2023 VBG 7.4 2/56/47/36 -Obtain CT chest -COVID/RSV/Flu--neg -check viral resp panel  Acute on chronic HFimpEF -05/13/2023 echo EF 55 to 60%, mild MR, no WMA, normal RVF -07/14/2020 echo EF 45 to 50%, G2DD -Repeat echo -Continue IV furosemide -I's and O's incomplete -12/30/23 ReDS = 32  Supratherapeutic INR -Patient given vitamin K on 12/30/2023 -Monitor INR -No active signs of bleeding presently -Monitor hemoglobin  Permanent atrial flutter -Rate controlled -Hold carvedilol secondary to soft blood pressures -Holding warfarin temporarily secondary to supratherapeutic INR -Status post ablation September 2014  Coronary artery disease with hx NSTEMI -No chest pain presently -DES to RCA 07/2020 -Continue Plavix -Continue Lipitor -Continue carvedilol  Diabetes mellitus type 2 with hypoglycemia -Patient has continued to be hypoglycemic -Check  cortisol -Holding glipizide and Actos 12/30/23 A1C--8.3  History of PE -holding warfarin temporarily as discussed   Hyperlipidemia -Continue statin  Cognitive impairment -pt able to perform ADLs with minimal assistance and uses walker at baseline          Family Communication:   son at bedside 3/19  Consultants:  none  Code Status:  DNR  DVT Prophylaxis:  warfarin   Procedures: As Listed in Progress Note Above  Antibiotics: Ceftriaxone/azithro 3/17        Subjective: Patient states that her breathing is low but better.  She denies any chest pain, abdominal pain, nausea, vomiting, diarrhea, hematochezia, melena.  She denies any headache.  There is no hemoptysis.  Objective: Vitals:   12/31/23 0600 12/31/23 0630 12/31/23 0700 12/31/23 0812  BP: (!) 123/46 (!) 102/39 (!) 115/55   Pulse: 94 84 91   Resp: 17 (!) 22 19   Temp:    98 F (36.7 C)  TempSrc:    Oral  SpO2: 97% 97% 96%   Weight:      Height:        Intake/Output Summary (Last 24 hours) at 12/31/2023 0856 Last data filed at 12/31/2023 3244 Gross per 24 hour  Intake 725.69 ml  Output 1300 ml  Net -574.31 ml   Weight change: 0.9 kg Exam:  General:  Pt is alert, follows commands appropriately, not in acute distress HEENT: No icterus, No thrush, No neck mass, [place]/AT Cardiovascular: IRRR, S1/S2, no rubs, no gallops Respiratory: Bilateral crackles.  No wheezing. Abdomen: Soft/+BS, non tender, non distended, no guarding Extremities: No edema, No lymphangitis, No petechiae, No rashes, no synovitis   Data Reviewed: I have personally reviewed following labs and imaging studies Basic Metabolic Panel: Recent Labs  Lab 12/29/23 1645 12/30/23 0425 12/31/23 0453  NA 139 142 142  K 4.4 4.1 4.0  CL 98 103 101  CO2 31 32 31  GLUCOSE 223* 135* 71  BUN 93* 93* 92*  CREATININE 2.67* 2.44* 2.63*  CALCIUM 9.4 8.7* 8.5*  MG  --   --  2.5*   Liver Function Tests: Recent Labs  Lab 12/29/23 1645   AST 22  ALT 24  ALKPHOS 56  BILITOT 1.3*  PROT 6.8  ALBUMIN 3.4*   No results for input(s): "LIPASE", "AMYLASE" in the last 168 hours. No results for input(s): "AMMONIA" in the last 168 hours. Coagulation Profile: Recent Labs  Lab 12/29/23 1645 12/30/23 0425 12/31/23 0453  INR 7.1* 10.3* 3.9*   CBC: Recent Labs  Lab 12/29/23 1645 12/30/23 0425 12/31/23 0453  WBC 4.5 3.9* 4.5  NEUTROABS 3.3  --   --   HGB 10.2* 8.8* 9.0*  HCT 32.5* 29.5* 29.6*  MCV 99.7 99.7 99.3  PLT 230 209 237   Cardiac Enzymes: No results for input(s): "CKTOTAL", "CKMB", "CKMBINDEX", "TROPONINI" in the last 168 hours. BNP: Invalid input(s): "POCBNP" CBG: Recent Labs  Lab 12/30/23 2153 12/31/23 0004 12/31/23 0344 12/31/23 0758 12/31/23 0844  GLUCAP 118* 97 83 51* 48*   HbA1C: Recent Labs    12/30/23 0425  HGBA1C 8.3*   Urine analysis:    Component Value Date/Time   COLORURINE YELLOW 12/29/2023 1649   APPEARANCEUR CLEAR 12/29/2023 1649   LABSPEC 1.010 12/29/2023 1649   PHURINE 5.0 12/29/2023 1649   GLUCOSEU NEGATIVE 12/29/2023 1649   HGBUR SMALL (A) 12/29/2023 1649   BILIRUBINUR NEGATIVE 12/29/2023 1649   KETONESUR NEGATIVE 12/29/2023 1649   PROTEINUR NEGATIVE 12/29/2023 1649   NITRITE NEGATIVE 12/29/2023 1649   LEUKOCYTESUR SMALL (A) 12/29/2023 1649   Sepsis Labs: @LABRCNTIP (procalcitonin:4,lacticidven:4) ) Recent Results (from the past 240 hours)  Resp panel by RT-PCR (RSV, Flu A&B, Covid) Anterior Nasal Swab     Status: None   Collection Time: 12/29/23  5:06 PM   Specimen: Anterior Nasal Swab  Result Value Ref Range Status   SARS Coronavirus 2 by RT PCR NEGATIVE NEGATIVE Final    Comment: (NOTE) SARS-CoV-2 target nucleic acids are NOT DETECTED.  The SARS-CoV-2 RNA is generally detectable in upper respiratory specimens during the acute phase of infection. The lowest concentration of SARS-CoV-2 viral copies this assay can detect is 138 copies/mL. A negative result  does not preclude SARS-Cov-2 infection and should not be used as the sole basis for treatment or other patient management decisions. A negative result may occur with  improper specimen collection/handling, submission of specimen other than nasopharyngeal swab, presence of viral mutation(s) within the areas targeted by this assay, and inadequate number of viral copies(<138 copies/mL). A negative result must be combined with clinical observations, patient history, and epidemiological information. The expected result is Negative.  Fact Sheet for Patients:  BloggerCourse.com  Fact Sheet for Healthcare Providers:  SeriousBroker.it  This test is no t yet approved or cleared by the [place] FDA and  has been authorized for detection and/or diagnosis of SARS-CoV-2 by FDA under an Emergency Use Authorization (EUA). This EUA will remain  in effect (meaning this test can be used) for the duration of the COVID-19 declaration under Section 564(b)(1) of the Act, 21 U.S.C.section 360bbb-3(b)(1), unless the authorization is terminated  or revoked sooner.       Influenza A by PCR NEGATIVE NEGATIVE Final   Influenza B by PCR NEGATIVE NEGATIVE Final  Comment: (NOTE) The Xpert Xpress SARS-CoV-2/FLU/RSV plus assay is intended as an aid in the diagnosis of influenza from Nasopharyngeal swab specimens and should not be used as a sole basis for treatment. Nasal washings and aspirates are unacceptable for Xpert Xpress SARS-CoV-2/FLU/RSV testing.  Fact Sheet for Patients: BloggerCourse.com  Fact Sheet for Healthcare Providers: SeriousBroker.it  This test is not yet approved or cleared by the [place] FDA and has been authorized for detection and/or diagnosis of SARS-CoV-2 by FDA under an Emergency Use Authorization (EUA). This EUA will remain in effect (meaning this test can be used) for  the duration of the COVID-19 declaration under Section 564(b)(1) of the Act, 21 U.S.C. section 360bbb-3(b)(1), unless the authorization is terminated or revoked.     Resp Syncytial Virus by PCR NEGATIVE NEGATIVE Final    Comment: (NOTE) Fact Sheet for Patients: BloggerCourse.com  Fact Sheet for Healthcare Providers: SeriousBroker.it  This test is not yet approved or cleared by the [place] FDA and has been authorized for detection and/or diagnosis of SARS-CoV-2 by FDA under an Emergency Use Authorization (EUA). This EUA will remain in effect (meaning this test can be used) for the duration of the COVID-19 declaration under Section 564(b)(1) of the Act, 21 U.S.C. section 360bbb-3(b)(1), unless the authorization is terminated or revoked.  Performed at [facility], [address] [place], [place] [address]   Blood Culture (routine x 2)     Status: None (Preliminary result)   Collection Time: 12/29/23  5:26 PM   Specimen: BLOOD  Result Value Ref Range Status   Specimen Description BLOOD BLOOD LEFT FOREARM  Final   Special Requests   Final    BOTTLES DRAWN AEROBIC ONLY Blood Culture results may not be optimal due to an inadequate volume of blood received in culture bottles   Culture   Final    NO GROWTH 2 DAYS Performed at [facility], [address], [place], [place] [address]    Report Status PENDING  Incomplete  Blood Culture (routine x 2)     Status: None (Preliminary result)   Collection Time: 12/29/23  5:26 PM   Specimen: BLOOD  Result Value Ref Range Status   Specimen Description BLOOD BLOOD RIGHT FOREARM  Final   Special Requests   Final    BOTTLES DRAWN AEROBIC AND ANAEROBIC Blood Culture results may not be optimal due to an inadequate volume of blood received in culture bottles   Culture   Final    NO GROWTH 2 DAYS Performed at [facility], [address] [place], [place] [address]    Report Status PENDING   Incomplete  MRSA Next Gen by PCR, Nasal     Status: None   Collection Time: 12/30/23  2:18 AM   Specimen: Nasal Mucosa; Nasal Swab  Result Value Ref Range Status   MRSA by PCR Next Gen NOT DETECTED NOT DETECTED Final    Comment: (NOTE) The GeneXpert MRSA Assay (FDA approved for NASAL specimens only), is one component of a comprehensive MRSA colonization surveillance program. It is not intended to diagnose MRSA infection nor to guide or monitor treatment for MRSA infections. Test performance is not FDA approved in patients less than 14 years old. Performed at [facility], [address], [place], [place] [address]      Scheduled Meds:  ascorbic acid  500 mg Oral Daily   atorvastatin  40 mg Oral Daily   B-complex with vitamin C  1 tablet Oral Daily   Chlorhexidine Gluconate  Cloth  6 each Topical Daily   cholecalciferol  1,000 Units Oral Daily   clopidogrel  75 mg Oral Q breakfast   furosemide  40 mg Intravenous Q12H   isosorbide mononitrate  30 mg Oral Daily   loratadine  10 mg Oral Daily   omega-3 acid ethyl esters  1 g Oral Daily   vitamin E  400 Units Oral Daily   Continuous Infusions:  Procedures/Studies: DG Chest Port 1 View Result Date: 12/29/2023 CLINICAL DATA:  Sepsis. EXAM: PORTABLE CHEST 1 VIEW COMPARISON:  Chest radiograph dated 10/10/2021. FINDINGS: No focal consolidation, pleural effusion, pneumothorax. Stable cardiomegaly. Atherosclerotic calcification of the aorta. Osteopenia with degenerative changes of the spine. No acute osseous pathology. IMPRESSION: 1. No active disease. 2. Cardiomegaly. Electronically Signed   By: Elgie Collard M.D.   On: 12/29/2023 17:49    Catarina Hartshorn, DO  [facility]  If 7PM-7AM, please contact night-coverage [email or web address] Password [email or web address] 12/31/2023, 8:56 AM   LOS: 2 days

## 2024-01-01 ENCOUNTER — Inpatient Hospital Stay (HOSPITAL_COMMUNITY)

## 2024-01-01 ENCOUNTER — Telehealth: Payer: Self-pay | Admitting: Cardiology

## 2024-01-01 DIAGNOSIS — Z789 Other specified health status: Secondary | ICD-10-CM | POA: Diagnosis not present

## 2024-01-01 DIAGNOSIS — N178 Other acute kidney failure: Secondary | ICD-10-CM

## 2024-01-01 DIAGNOSIS — I509 Heart failure, unspecified: Secondary | ICD-10-CM

## 2024-01-01 DIAGNOSIS — I3139 Other pericardial effusion (noninflammatory): Secondary | ICD-10-CM

## 2024-01-01 DIAGNOSIS — N179 Acute kidney failure, unspecified: Secondary | ICD-10-CM

## 2024-01-01 DIAGNOSIS — R918 Other nonspecific abnormal finding of lung field: Secondary | ICD-10-CM | POA: Diagnosis not present

## 2024-01-01 DIAGNOSIS — J9602 Acute respiratory failure with hypercapnia: Secondary | ICD-10-CM | POA: Diagnosis not present

## 2024-01-01 DIAGNOSIS — I5031 Acute diastolic (congestive) heart failure: Secondary | ICD-10-CM | POA: Diagnosis not present

## 2024-01-01 DIAGNOSIS — I5033 Acute on chronic diastolic (congestive) heart failure: Secondary | ICD-10-CM | POA: Diagnosis not present

## 2024-01-01 DIAGNOSIS — R0989 Other specified symptoms and signs involving the circulatory and respiratory systems: Secondary | ICD-10-CM | POA: Diagnosis not present

## 2024-01-01 DIAGNOSIS — J90 Pleural effusion, not elsewhere classified: Secondary | ICD-10-CM | POA: Diagnosis not present

## 2024-01-01 DIAGNOSIS — I4891 Unspecified atrial fibrillation: Secondary | ICD-10-CM

## 2024-01-01 DIAGNOSIS — N189 Chronic kidney disease, unspecified: Secondary | ICD-10-CM

## 2024-01-01 DIAGNOSIS — J9601 Acute respiratory failure with hypoxia: Secondary | ICD-10-CM | POA: Diagnosis not present

## 2024-01-01 DIAGNOSIS — J811 Chronic pulmonary edema: Secondary | ICD-10-CM | POA: Diagnosis not present

## 2024-01-01 LAB — ECHOCARDIOGRAM COMPLETE
AR max vel: 1.16 cm2
AV Area VTI: 1.08 cm2
AV Area mean vel: 1.08 cm2
AV Mean grad: 11.4 mmHg
AV Peak grad: 20.8 mmHg
Ao pk vel: 2.28 m/s
Area-P 1/2: 6.54 cm2
Calc EF: 66.2 %
Height: 65 in
MV M vel: 4.37 m/s
MV Peak grad: 76.4 mmHg
MV VTI: 1.77 cm2
S' Lateral: 3.4 cm
Single Plane A2C EF: 63.3 %
Single Plane A4C EF: 63.8 %
Weight: 3202.84 [oz_av]

## 2024-01-01 LAB — GLUCOSE, CAPILLARY
Glucose-Capillary: 160 mg/dL — ABNORMAL HIGH (ref 70–99)
Glucose-Capillary: 181 mg/dL — ABNORMAL HIGH (ref 70–99)
Glucose-Capillary: 192 mg/dL — ABNORMAL HIGH (ref 70–99)
Glucose-Capillary: 223 mg/dL — ABNORMAL HIGH (ref 70–99)
Glucose-Capillary: 360 mg/dL — ABNORMAL HIGH (ref 70–99)
Glucose-Capillary: 363 mg/dL — ABNORMAL HIGH (ref 70–99)
Glucose-Capillary: 382 mg/dL — ABNORMAL HIGH (ref 70–99)

## 2024-01-01 LAB — BLOOD GAS, ARTERIAL
Acid-Base Excess: 4.4 mmol/L — ABNORMAL HIGH (ref 0.0–2.0)
Bicarbonate: 32.9 mmol/L — ABNORMAL HIGH (ref 20.0–28.0)
Drawn by: 27016
O2 Saturation: 100 %
Patient temperature: 37
pCO2 arterial: 70 mmHg (ref 32–48)
pH, Arterial: 7.28 — ABNORMAL LOW (ref 7.35–7.45)
pO2, Arterial: 162 mmHg — ABNORMAL HIGH (ref 83–108)

## 2024-01-01 LAB — BASIC METABOLIC PANEL
Anion gap: 12 (ref 5–15)
BUN: 91 mg/dL — ABNORMAL HIGH (ref 8–23)
CO2: 31 mmol/L (ref 22–32)
Calcium: 8.9 mg/dL (ref 8.9–10.3)
Chloride: 101 mmol/L (ref 98–111)
Creatinine, Ser: 2.45 mg/dL — ABNORMAL HIGH (ref 0.44–1.00)
GFR, Estimated: 19 mL/min — ABNORMAL LOW (ref >60)
Glucose, Bld: 191 mg/dL — ABNORMAL HIGH (ref 70–99)
Potassium: 4.1 mmol/L (ref 3.5–5.1)
Sodium: 144 mmol/L (ref 135–145)

## 2024-01-01 LAB — CBC
HCT: 29.7 % — ABNORMAL LOW (ref 36.0–46.0)
Hemoglobin: 9.4 g/dL — ABNORMAL LOW (ref 12.0–15.0)
MCH: 31.1 pg (ref 26.0–34.0)
MCHC: 31.6 g/dL (ref 30.0–36.0)
MCV: 98.3 fL (ref 80.0–100.0)
Platelets: 273 10*3/uL (ref 150–400)
RBC: 3.02 MIL/uL — ABNORMAL LOW (ref 3.87–5.11)
RDW: 15.9 % — ABNORMAL HIGH (ref 11.5–15.5)
WBC: 5.1 10*3/uL (ref 4.0–10.5)
nRBC: 0 % (ref 0.0–0.2)

## 2024-01-01 LAB — PROTIME-INR
INR: 1.4 — ABNORMAL HIGH (ref 0.8–1.2)
Prothrombin Time: 17.1 s — ABNORMAL HIGH (ref 11.4–15.2)

## 2024-01-01 LAB — MAGNESIUM: Magnesium: 2.6 mg/dL — ABNORMAL HIGH (ref 1.7–2.4)

## 2024-01-01 MED ORDER — FUROSEMIDE 10 MG/ML IJ SOLN
40.0000 mg | Freq: Two times a day (BID) | INTRAMUSCULAR | Status: DC
  Administered 2024-01-01: 40 mg via INTRAVENOUS

## 2024-01-01 MED ORDER — BUDESONIDE 0.5 MG/2ML IN SUSP
0.5000 mg | Freq: Two times a day (BID) | RESPIRATORY_TRACT | Status: DC
  Administered 2024-01-01 (×2): 0.5 mg via RESPIRATORY_TRACT
  Filled 2024-01-01 (×2): qty 2

## 2024-01-01 MED ORDER — INSULIN ASPART 100 UNIT/ML IJ SOLN
0.0000 [IU] | Freq: Every day | INTRAMUSCULAR | Status: DC
  Administered 2024-01-01: 5 [IU] via SUBCUTANEOUS

## 2024-01-01 MED ORDER — HEPARIN (PORCINE) 25000 UT/250ML-% IV SOLN
1000.0000 [IU]/h | INTRAVENOUS | Status: DC
  Administered 2024-01-01 (×2): 1200 [IU]/h via INTRAVENOUS
  Filled 2024-01-01: qty 250

## 2024-01-01 MED ORDER — LORAZEPAM 2 MG/ML IJ SOLN
INTRAMUSCULAR | Status: AC
Start: 2024-01-01 — End: 2024-01-01
  Administered 2024-01-01: 1 mg via INTRAVENOUS
  Filled 2024-01-01: qty 1

## 2024-01-01 MED ORDER — MORPHINE SULFATE (PF) 2 MG/ML IV SOLN
1.0000 mg | Freq: Once | INTRAVENOUS | Status: AC
  Administered 2024-01-01: 1 mg via INTRAVENOUS

## 2024-01-01 MED ORDER — LORAZEPAM 2 MG/ML IJ SOLN
0.5000 mg | Freq: Once | INTRAMUSCULAR | Status: AC
  Administered 2024-01-01: 0.5 mg via INTRAVENOUS
  Filled 2024-01-01: qty 1

## 2024-01-01 MED ORDER — METOPROLOL TARTRATE 5 MG/5ML IV SOLN
2.5000 mg | Freq: Four times a day (QID) | INTRAVENOUS | Status: DC
  Filled 2024-01-01: qty 5

## 2024-01-01 MED ORDER — ARFORMOTEROL TARTRATE 15 MCG/2ML IN NEBU
15.0000 ug | INHALATION_SOLUTION | Freq: Two times a day (BID) | RESPIRATORY_TRACT | Status: DC
  Administered 2024-01-01 (×2): 15 ug via RESPIRATORY_TRACT
  Filled 2024-01-01 (×2): qty 2

## 2024-01-01 MED ORDER — IPRATROPIUM-ALBUTEROL 0.5-2.5 (3) MG/3ML IN SOLN
3.0000 mL | Freq: Four times a day (QID) | RESPIRATORY_TRACT | Status: DC
  Administered 2024-01-01 (×3): 3 mL via RESPIRATORY_TRACT
  Filled 2024-01-01 (×4): qty 3

## 2024-01-01 MED ORDER — WARFARIN - PHARMACIST DOSING INPATIENT: Freq: Every day | Status: DC

## 2024-01-01 MED ORDER — HEPARIN BOLUS VIA INFUSION
3000.0000 [IU] | Freq: Once | INTRAVENOUS | Status: AC
  Administered 2024-01-01: 3000 [IU] via INTRAVENOUS
  Filled 2024-01-01: qty 3000

## 2024-01-01 MED ORDER — NOREPINEPHRINE 4 MG/250ML-% IV SOLN
0.0000 ug/min | INTRAVENOUS | Status: DC
Start: 2024-01-01 — End: 2024-01-02
  Filled 2024-01-01: qty 250

## 2024-01-01 MED ORDER — INSULIN ASPART 100 UNIT/ML IJ SOLN: 0.0000 [IU] | Freq: Three times a day (TID) | INTRAMUSCULAR | Status: DC

## 2024-01-01 MED ORDER — AMIODARONE HCL IN DEXTROSE 360-4.14 MG/200ML-% IV SOLN
60.0000 mg/h | INTRAVENOUS | Status: AC
Start: 2024-01-01 — End: 2024-01-01
  Administered 2024-01-01 (×2): 60 mg/h via INTRAVENOUS
  Filled 2024-01-01: qty 200

## 2024-01-01 MED ORDER — LORAZEPAM 2 MG/ML IJ SOLN: 1.0000 mg | Freq: Once | INTRAMUSCULAR | Status: AC

## 2024-01-01 MED ORDER — MORPHINE SULFATE (PF) 2 MG/ML IV SOLN
1.0000 mg | INTRAVENOUS | Status: DC | PRN
  Administered 2024-01-01 (×2): 1 mg via INTRAVENOUS
  Filled 2024-01-01 (×3): qty 1

## 2024-01-01 MED ORDER — ALPRAZOLAM 0.5 MG PO TABS: 0.5000 mg | ORAL_TABLET | Freq: Two times a day (BID) | ORAL | Status: DC

## 2024-01-01 MED ORDER — METHYLPREDNISOLONE SODIUM SUCC 125 MG IJ SOLR
60.0000 mg | Freq: Two times a day (BID) | INTRAMUSCULAR | Status: DC
  Administered 2024-01-01 (×2): 60 mg via INTRAVENOUS
  Filled 2024-01-01 (×2): qty 2

## 2024-01-01 MED ORDER — WARFARIN SODIUM 2.5 MG PO TABS: 2.5000 mg | ORAL_TABLET | Freq: Once | ORAL | Status: DC

## 2024-01-01 MED ORDER — LORAZEPAM 2 MG/ML IJ SOLN
1.0000 mg | Freq: Once | INTRAMUSCULAR | Status: AC
  Administered 2024-01-01: 1 mg via INTRAVENOUS

## 2024-01-01 MED ORDER — AMIODARONE HCL IN DEXTROSE 360-4.14 MG/200ML-% IV SOLN
30.0000 mg/h | INTRAVENOUS | Status: DC
  Administered 2024-01-01 (×2): 30 mg/h via INTRAVENOUS
  Filled 2024-01-01: qty 200

## 2024-01-01 MED ORDER — FUROSEMIDE 10 MG/ML IJ SOLN
80.0000 mg | Freq: Two times a day (BID) | INTRAMUSCULAR | Status: DC
  Filled 2024-01-01: qty 8

## 2024-01-01 MED ORDER — LORAZEPAM 2 MG/ML IJ SOLN
INTRAMUSCULAR | Status: AC
  Filled 2024-01-01: qty 1

## 2024-01-01 MED ORDER — MORPHINE SULFATE (PF) 2 MG/ML IV SOLN: 1.0000 mg | Freq: Once | INTRAVENOUS | Status: AC

## 2024-01-01 MED ORDER — FUROSEMIDE 10 MG/ML IJ SOLN
40.0000 mg | Freq: Once | INTRAMUSCULAR | Status: AC
  Administered 2024-01-01: 40 mg via INTRAVENOUS
  Filled 2024-01-01: qty 4

## 2024-01-01 MED ORDER — MORPHINE SULFATE (PF) 2 MG/ML IV SOLN
INTRAVENOUS | Status: AC
  Filled 2024-01-01: qty 1

## 2024-01-01 NOTE — Progress Notes (Signed)
 Date and time results received: 01/01/24 1530  (use smartphrase ".now" to insert current time)  Test: Arterial Co2 Critical Value: 73  Name of Provider Notified: Dr. Arbutus Leas  Orders Received? Or Actions Taken?: MD notified. Awaiting orders.

## 2024-01-01 NOTE — Progress Notes (Addendum)
 Responded to nursing call:  respiratory distress   Subjective: Pt developed increase sob on BiPAP  Vitals:   01/01/24 1200 01/01/24 1240 01/01/24 1300 01/01/24 1400  BP: 108/63 (!) 123/56 102/61 (!) 186/91  Pulse: (!) 166  (!) 132 (!) 108  Resp: 18 18 18 14   Temp:      TempSrc:      SpO2: 98% 95% 97% 92%  Weight:      Height:       CV--IRRR Lung--bibasilar wheeze.  Bilateral rales Abd--soft+BS/NT   Assessment/Plan:  Acute respiratory failure with hypoxia and hypercarbia -STAT CXR--personally reviewed>>pulmonary edema -RN reports about 300 cc UO since lasix at 1242 -Stat ABG -continue solumedrol -continue pulmicort, brovana, duonebs -3/19 PCT <0.10 -3/20 Echo EF 45-60%, no WMA, low normal RVF; small to mod pericardial eff, mild MR -morphine 1 mg x 1>>continue prn for air hunger -COVID/RSV/Flu --neg -remain on BiPAP--FiO2 remained at 0.8 with saturation 100%  Afib with RVR -amio drip -d/c lopressor -start IV heparin once central line is placed    Poor IV access -consulted surgery for central line  Social -son and daughter updated -they now wishes to change patient to full code   The patient is critically ill with multiple organ systems failure and requires high complexity decision making for assessment and support, frequent evaluation and titration of therapies, application of advanced monitoring technologies and extensive interpretation of multiple databases.  Critical care time - 40 mins.       Catarina Hartshorn, DO [facility]

## 2024-01-01 NOTE — Telephone Encounter (Signed)
 Spoke with daughter [name] who states that pt is currently admitted in the hospital. Family is concerned that Pt.'s meds have been changed in the hospital. Daughter reports that Plavix was stopped and lasix dose has changed. She states that pt has a dry cough and has been put back on bi-pap. Please advise.

## 2024-01-01 NOTE — Procedures (Signed)
 Procedure Note  01/01/24    Preoperative Diagnosis: Congestive heart failure, respiratory failure with hypoxia and hypercarbia, acute on chronic renal failure, poor access    Postoperative Diagnosis: Same   Procedure(s) Performed: Central Line placement, right internal jugular    Surgeon: Leatrice Jewels. Henreitta Leber, MD   Assistants: None   Anesthesia: 1% lidocaine    Complications: None    Indications: [name] is a 83 y.o. with CHF, respiratory failure, worsening renal failure and poor access. Dr. Arbutus Leas requested a central line. I discussed the risk and benefits of placement of the central line with her daughter, including but not limited to bleeding, infection, and risk of pneumothorax. She has given verbal consent for the procedure.    Procedure: The patient placed supine. The right chest and neck was prepped and draped in the usual sterile fashion.  Wearing full gown and gloves, I performed the procedure.  One percent lidocaine was used for local anesthesia. An ultrasound was utilized to assess the jugular vein.  The needle with syringe was advanced into the vein with dark venous return, and a wire was placed using the Seldinger technique without difficulty.  Ectopia was noted and the wire was pulled back some.  The skin was knicked and a dilator was placed, and the three lumen catheter was placed over the wire with continued control of the wire.  There was good draw back of blood from all three lumens and each flushed easily with saline.  The catheter was secured in 4 points with 2-0 silk and a biopatch and dressing was placed.     The patient tolerated the procedure well, and the CXR was ordered to confirm position of the central line.   Algis Greenhouse, MD [facility] [address] [place], [place] [address] [phone] (office)

## 2024-01-01 NOTE — Plan of Care (Signed)
 Pt on 10L high flow nasal cannula when suddenly she developed severe shortness of breath. HR 140s in afib, RR 40s, SBP 180, Oxygen saturations down to 50s. Quickly transitioned pt to BiPAP with an oxygen concentration of 80%. Pt remains severely tachypneic. Called  RT for a breathing treatment and Notified Tat, MD & Strader, PA to come to room ASAP.

## 2024-01-01 NOTE — Progress Notes (Signed)
  Echocardiogram 2D Echocardiogram has been performed.  Ocie Doyne RDCS 01/01/2024, 9:12 AM

## 2024-01-01 NOTE — Progress Notes (Addendum)
 PROGRESS NOTE  [name] YNW:[id number] DOB: 1941-08-26 DOA: 12/29/2023 PCP: Roe [name], NP  Brief History:  83 y.o. female with medical history significant for diabetes mellitus type 2, hypertension, hyperlipidemia,, permanent atrial flutter status post ablation September 2014 , bilateral PE on Coumadin, stage III chronic kidney disease, coronary artery disease status post DES to the RCA 07/2020 , essential hypertension, HFrEF, vascular dementia and CVA, dyslipidemia and ischemic and myopathy, who presented to the ER with acute onset of worsening dyspnea with associated orthopnea without paroxysmal nocturnal dyspnea.  She has been having dyspnea on exertion.  No fever or chills.  No nausea or vomiting or abdominal pain.  She noted neck erythematous rash without itching.  No dysuria, oliguria or hematuria or flank pain.  Pt was admitted with acute heart failure and recurrent hypoglycemia from glipizide in the setting of chronic kidney disease.   Notably, the patient had an office visit with cardiology on 11/29/2023.  Her office weight at that time was 185 pounds The patient was started on IV furosemide with good diuresis.  Unfortunately, her respiratory status was slow to improve.  She continued alternate on and off BiPAP.  Her carvedilol was initially held secondary to soft blood pressures.  As result, her atrial fibrillation rate increased.  Cardiology was consulted to assist with management.   Assessment/Plan: Acute respiratory failure with hypoxia and hypercarbia -Secondary to CHF -Initially required BiPAP -weaned to 2L, but back to BiPAP for increase WOB on 3/20 -Wean oxygen as tolerated -12/31/2023 VBG 7.4 2/56/47/36 -Obtain CT chest--small bilateral pleural effusions; lobular interstitial accentuation with patchy GGO, favor edema, LUL GG nodule -COVID/RSV/Flu--neg -check viral resp panel--neg -repeat CXR -PCT <0.10   Acute on chronic HFimpEF -05/13/2023 echo EF 55  to 60%, mild MR, no WMA, normal RVF -07/14/2020 echo EF 45 to 50%, G2DD -Repeat echo--results pending -Continue IV furosemide 40 mg bid -I's and O's incomplete -12/30/23 ReDS = 32 -12/31/23 ReDS = 33   Supratherapeutic INR -Patient given vitamin K on 12/30/2023 -Monitor INR>>down to 1.4>>restart warfarin -No active signs of bleeding presently -Monitor hemoglobin--stable   Permanent atrial flutter -HR now up b/c coreg was held due to soft BPs -Hold carvedilol secondary to soft blood pressures -Holding warfarin temporarily secondary to supratherapeutic INR -Status post ablation September 2014  LUE edema -venous duplex  Bronchospasm -Start DuoNebs -Start Solu-Medrol -Start pulm core   Coronary artery disease with hx NSTEMI -No chest pain presently -DES to RCA 07/2020 -Continue Plavix -Continue Lipitor -Continue carvedilol   Diabetes mellitus type 2 with hypoglycemia -Patient has continued to be hypoglycemic>>improved now -Check cortisol--15.3 -Holding glipizide and Actos 12/30/23 A1C--8.3   History of PE -holding warfarin temporarily as discussed -now with INR 1.4>>restart warfarin   Hyperlipidemia -Continue statin   Cognitive impairment -pt able to perform ADLs with minimal assistance and uses walker at baseline                   Family Communication:   son at bedside 3/20   Consultants:  none   Code Status:  DNR   DVT Prophylaxis:  warfarin     Procedures: As Listed in Progress Note Above   Antibiotics: Ceftriaxone/azithro 3/17              Subjective: Patient is low but more short of breath this morning.  She denies any chest pain or abdominal pain.  Denies any vomiting, diarrhea, headache, neck pain.  Objective: Vitals:   01/01/24 0500 01/01/24 0600 01/01/24 0700 01/01/24 0743  BP: (!) 122/53 (!) 123/43 119/61   Pulse: (!) 117 (!) 108 (!) 125   Resp: 18 19 (!) 23   Temp:    98.5 F (36.9 C)  TempSrc:    Oral  SpO2: 94% 94%  92%   Weight:      Height:        Intake/Output Summary (Last 24 hours) at 01/01/2024 1015 Last data filed at 01/01/2024 0900 Gross per 24 hour  Intake 120 ml  Output 1400 ml  Net -1280 ml   Weight change: 4.9 kg Exam:  General:  Pt is alert, follows commands appropriately, not in acute distress HEENT: No icterus, No thrush, No neck mass, [place]/AT Cardiovascular: IRRR, S1/S2, no rubs, no gallops Respiratory: Bilateral crackles.  Bilateral wheeze. Abdomen: Soft/+BS, non tender, non distended, no guarding Extremities: Trace lower extremity edema, No lymphangitis, No petechiae, No rashes, no synovitis   Data Reviewed: I have personally reviewed following labs and imaging studies Basic Metabolic Panel: Recent Labs  Lab 12/29/23 1645 12/30/23 0425 12/31/23 0453 01/01/24 0435  NA 139 142 142 144  K 4.4 4.1 4.0 4.1  CL 98 103 101 101  CO2 31 32 31 31  GLUCOSE 223* 135* 71 191*  BUN 93* 93* 92* 91*  CREATININE 2.67* 2.44* 2.63* 2.45*  CALCIUM 9.4 8.7* 8.5* 8.9  MG  --   --  2.5* 2.6*   Liver Function Tests: Recent Labs  Lab 12/29/23 1645  AST 22  ALT 24  ALKPHOS 56  BILITOT 1.3*  PROT 6.8  ALBUMIN 3.4*   No results for input(s): "LIPASE", "AMYLASE" in the last 168 hours. No results for input(s): "AMMONIA" in the last 168 hours. Coagulation Profile: Recent Labs  Lab 12/29/23 1645 12/30/23 0425 12/31/23 0453 01/01/24 0435  INR 7.1* 10.3* 3.9* 1.4*   CBC: Recent Labs  Lab 12/29/23 1645 12/30/23 0425 12/31/23 0453 01/01/24 0435  WBC 4.5 3.9* 4.5 5.1  NEUTROABS 3.3  --   --   --   HGB 10.2* 8.8* 9.0* 9.4*  HCT 32.5* 29.5* 29.6* 29.7*  MCV 99.7 99.7 99.3 98.3  PLT 230 209 237 273   Cardiac Enzymes: No results for input(s): "CKTOTAL", "CKMB", "CKMBINDEX", "TROPONINI" in the last 168 hours. BNP: Invalid input(s): "POCBNP" CBG: Recent Labs  Lab 12/31/23 1607 12/31/23 2000 01/01/24 0015 01/01/24 0416 01/01/24 0747  GLUCAP 122* 144* 160* 192* 181*    HbA1C: Recent Labs    12/30/23 0425  HGBA1C 8.3*   Urine analysis:    Component Value Date/Time   COLORURINE YELLOW 12/29/2023 1649   APPEARANCEUR CLEAR 12/29/2023 1649   LABSPEC 1.010 12/29/2023 1649   PHURINE 5.0 12/29/2023 1649   GLUCOSEU NEGATIVE 12/29/2023 1649   HGBUR SMALL (A) 12/29/2023 1649   BILIRUBINUR NEGATIVE 12/29/2023 1649   KETONESUR NEGATIVE 12/29/2023 1649   PROTEINUR NEGATIVE 12/29/2023 1649   NITRITE NEGATIVE 12/29/2023 1649   LEUKOCYTESUR SMALL (A) 12/29/2023 1649   Sepsis Labs: @LABRCNTIP (procalcitonin:4,lacticidven:4) ) Recent Results (from the past 240 hours)  Urine Culture     Status: Abnormal   Collection Time: 12/29/23  4:49 PM   Specimen: Urine, Random  Result Value Ref Range Status   Specimen Description   Final    URINE, RANDOM Performed at [facility], [address] [place], [place] [address]    Special Requests   Final    NONE Reflexed from [id number] Performed at [facility],  [address], [place], [place] [address]    Culture (A)  Final    <10,000 COLONIES/mL INSIGNIFICANT GROWTH Performed at [facility], [address] [place], [place] [address]    Report Status 12/31/2023 FINAL  Final  Resp panel by RT-PCR (RSV, Flu A&B, Covid) Anterior Nasal Swab     Status: None   Collection Time: 12/29/23  5:06 PM   Specimen: Anterior Nasal Swab  Result Value Ref Range Status   SARS Coronavirus 2 by RT PCR NEGATIVE NEGATIVE Final    Comment: (NOTE) SARS-CoV-2 target nucleic acids are NOT DETECTED.  The SARS-CoV-2 RNA is generally detectable in upper respiratory specimens during the acute phase of infection. The lowest concentration of SARS-CoV-2 viral copies this assay can detect is 138 copies/mL. A negative result does not preclude SARS-Cov-2 infection and should not be used as the sole basis for treatment or other patient management decisions. A negative result may occur with  improper specimen collection/handling, submission  of specimen other than nasopharyngeal swab, presence of viral mutation(s) within the areas targeted by this assay, and inadequate number of viral copies(<138 copies/mL). A negative result must be combined with clinical observations, patient history, and epidemiological information. The expected result is Negative.  Fact Sheet for Patients:  BloggerCourse.com  Fact Sheet for Healthcare Providers:  SeriousBroker.it  This test is no t yet approved or cleared by the [place] FDA and  has been authorized for detection and/or diagnosis of SARS-CoV-2 by FDA under an Emergency Use Authorization (EUA). This EUA will remain  in effect (meaning this test can be used) for the duration of the COVID-19 declaration under Section 564(b)(1) of the Act, 21 U.S.C.section 360bbb-3(b)(1), unless the authorization is terminated  or revoked sooner.       Influenza A by PCR NEGATIVE NEGATIVE Final   Influenza B by PCR NEGATIVE NEGATIVE Final    Comment: (NOTE) The Xpert Xpress SARS-CoV-2/FLU/RSV plus assay is intended as an aid in the diagnosis of influenza from Nasopharyngeal swab specimens and should not be used as a sole basis for treatment. Nasal washings and aspirates are unacceptable for Xpert Xpress SARS-CoV-2/FLU/RSV testing.  Fact Sheet for Patients: BloggerCourse.com  Fact Sheet for Healthcare Providers: SeriousBroker.it  This test is not yet approved or cleared by the [place] FDA and has been authorized for detection and/or diagnosis of SARS-CoV-2 by FDA under an Emergency Use Authorization (EUA). This EUA will remain in effect (meaning this test can be used) for the duration of the COVID-19 declaration under Section 564(b)(1) of the Act, 21 U.S.C. section 360bbb-3(b)(1), unless the authorization is terminated or revoked.     Resp Syncytial Virus by PCR NEGATIVE NEGATIVE  Final    Comment: (NOTE) Fact Sheet for Patients: BloggerCourse.com  Fact Sheet for Healthcare Providers: SeriousBroker.it  This test is not yet approved or cleared by the [place] FDA and has been authorized for detection and/or diagnosis of SARS-CoV-2 by FDA under an Emergency Use Authorization (EUA). This EUA will remain in effect (meaning this test can be used) for the duration of the COVID-19 declaration under Section 564(b)(1) of the Act, 21 U.S.C. section 360bbb-3(b)(1), unless the authorization is terminated or revoked.  Performed at [facility], [address] [place], [place] [address]   Blood Culture (routine x 2)     Status: None (Preliminary result)   Collection Time: 12/29/23  5:26 PM   Specimen: BLOOD  Result Value Ref Range Status   Specimen Description BLOOD BLOOD LEFT  FOREARM  Final   Special Requests   Final    BOTTLES DRAWN AEROBIC ONLY Blood Culture results may not be optimal due to an inadequate volume of blood received in culture bottles   Culture   Final    NO GROWTH 3 DAYS Performed at [facility], [address], [place], [place] [address]    Report Status PENDING  Incomplete  Blood Culture (routine x 2)     Status: None (Preliminary result)   Collection Time: 12/29/23  5:26 PM   Specimen: BLOOD  Result Value Ref Range Status   Specimen Description BLOOD BLOOD RIGHT FOREARM  Final   Special Requests   Final    BOTTLES DRAWN AEROBIC AND ANAEROBIC Blood Culture results may not be optimal due to an inadequate volume of blood received in culture bottles   Culture   Final    NO GROWTH 3 DAYS Performed at [facility], [address], [place], [place] [address]    Report Status PENDING  Incomplete  MRSA Next Gen by PCR, Nasal     Status: None   Collection Time: 12/30/23  2:18 AM   Specimen: Nasal Mucosa; Nasal Swab  Result Value Ref Range Status   MRSA by PCR Next Gen NOT DETECTED NOT DETECTED  Final    Comment: (NOTE) The GeneXpert MRSA Assay (FDA approved for NASAL specimens only), is one component of a comprehensive MRSA colonization surveillance program. It is not intended to diagnose MRSA infection nor to guide or monitor treatment for MRSA infections. Test performance is not FDA approved in patients less than 23 years old. Performed at [facility], [address] [place], [place] [address]   Respiratory (~20 pathogens) panel by PCR     Status: None   Collection Time: 12/31/23 12:35 PM   Specimen: Nasopharyngeal Swab; Respiratory  Result Value Ref Range Status   Adenovirus NOT DETECTED NOT DETECTED Final   Coronavirus 229E NOT DETECTED NOT DETECTED Final    Comment: (NOTE) The Coronavirus on the Respiratory Panel, DOES NOT test for the novel  Coronavirus (2019 nCoV)    Coronavirus HKU1 NOT DETECTED NOT DETECTED Final   Coronavirus NL63 NOT DETECTED NOT DETECTED Final   Coronavirus OC43 NOT DETECTED NOT DETECTED Final   Metapneumovirus NOT DETECTED NOT DETECTED Final   Rhinovirus / Enterovirus NOT DETECTED NOT DETECTED Final   Influenza A NOT DETECTED NOT DETECTED Final   Influenza B NOT DETECTED NOT DETECTED Final   Parainfluenza Virus 1 NOT DETECTED NOT DETECTED Final   Parainfluenza Virus 2 NOT DETECTED NOT DETECTED Final   Parainfluenza Virus 3 NOT DETECTED NOT DETECTED Final   Parainfluenza Virus 4 NOT DETECTED NOT DETECTED Final   Respiratory Syncytial Virus NOT DETECTED NOT DETECTED Final   Bordetella pertussis NOT DETECTED NOT DETECTED Final   Bordetella Parapertussis NOT DETECTED NOT DETECTED Final   Chlamydophila pneumoniae NOT DETECTED NOT DETECTED Final   Mycoplasma pneumoniae NOT DETECTED NOT DETECTED Final    Comment: Performed at [facility], [address], [place], [place] [address]     Scheduled Meds:  ALPRAZolam  0.5 mg Oral BID   arformoterol  15 mcg Nebulization BID   ascorbic acid  500 mg Oral Daily   atorvastatin  40 mg Oral Daily    B-complex with vitamin C  1 tablet Oral Daily   budesonide (PULMICORT) nebulizer solution  0.5 mg Nebulization BID   Chlorhexidine Gluconate Cloth  6 each Topical Daily   cholecalciferol  1,000 Units Oral Daily   clopidogrel  75 mg Oral Q breakfast   furosemide  40 mg Intravenous Q12H   ipratropium-albuterol  3 mL Nebulization Q6H   isosorbide mononitrate  30 mg Oral Daily   loratadine  10 mg Oral Daily   methylPREDNISolone (SOLU-MEDROL) injection  60 mg Intravenous Q12H   metoprolol tartrate  2.5 mg Intravenous Q6H   omega-3 acid ethyl esters  1 g Oral Daily   vitamin E  400 Units Oral Daily   Continuous Infusions:  Procedures/Studies: CT CHEST WO CONTRAST Result Date: 12/31/2023 CLINICAL DATA:  Respiratory illness, acute respiratory failure with hypoxia and hypercapnia. EXAM: CT CHEST WITHOUT CONTRAST TECHNIQUE: Multidetector CT imaging of the chest was performed following the standard protocol without IV contrast. RADIATION DOSE REDUCTION: This exam was performed according to the departmental dose-optimization program which includes automated exposure control, adjustment of the mA and/or kV according to patient size and/or use of iterative reconstruction technique. COMPARISON:  Radiograph 12/31/2023 and CT chest 11/10/2020 FINDINGS: Cardiovascular: Dense atheromatous vascular calcification of the thoracic aorta, branch vessels, and coronary arteries. Dense mitral valve and moderate aortic valve calcifications. Small to moderate inferior pericardial effusion. Moderate cardiomegaly. Mediastinum/Nodes: Borderline enlarged 1.0 cm right lower paratracheal lymph node on image 53 series 2, previously 0.8 cm. Suspected mildly enlarged hilar lymph nodes. Low-level edema in the mediastinal adipose tissues and diffusely in the subcutaneous tissues. Lungs/Pleura: Small bilateral pleural effusions. Associated passive atelectasis in addition to disproportionate atelectasis in the left lower lobe and  inferior lingula. Secondary pulmonary lobular interstitial accentuation with patchy ground-glass opacities in the lungs favoring pulmonary edema, less likely from atypical pneumonia or acute hypersensitivity pneumonitis. Hazy 0.8 cm ground-glass density nodule in the apicoposterior segment left upper lobe image 25 series 3 is stable from 11/10/2020 but other part solid and subsolid nodules from that time have generally resolved. Upper Abdomen: Left kidney upper pole cysts as previously characterized. Dense atheromatous vascular calcification. Musculoskeletal: Degenerative glenohumeral arthropathy bilaterally. Mild upper thoracic kyphosis and thoracic spondylosis. IMPRESSION: 1. Small bilateral pleural effusions with associated passive atelectasis in addition to disproportionate atelectasis in the left lower lobe and inferior lingula. 2. Secondary pulmonary lobular interstitial accentuation with patchy ground-glass opacities in the lungs favoring pulmonary edema, less likely from atypical pneumonia or acute hypersensitivity pneumonitis. 3. Moderate cardiomegaly. Small to moderate inferior pericardial effusion. 4. Dense atheromatous vascular calcification of the thoracic aorta, branch vessels, and coronary arteries. 5. Low-grade diffuse subcutaneous and mediastinal edema, probably from third spacing of fluid, correlate with recent weight changes. 6. Dense mitral valve and moderate aortic valve calcifications. Correlate clinically in assessing for signs/symptoms of mitral valve dysfunction. 7. Hazy 0.8 by 0.6 cm ground-glass density nodule in the apicoposterior segment left upper lobe is stable from 11/10/2020 but other part solid and subsolid nodules from that time have generally resolved. Surveillance suggested with noncontrast chest CT in 12 months time. 8. Aortic Atherosclerosis (ICD10-I70.0). Electronically Signed   By: Gaylyn Rong M.D.   On: 12/31/2023 17:24   DG CHEST PORT 1 VIEW Result Date:  12/31/2023 CLINICAL DATA:  Heart failure EXAM: PORTABLE CHEST 1 VIEW COMPARISON:  One-view chest x-ray 12/29/2023. FINDINGS: Stable cardiomegaly is noted. Atherosclerotic changes are present at the aortic arch. Mild interstitial edema is now present. Retrocardiac opacification is present. No other focal airspace disease is present. Remote fracture of the proximal right humerus is again noted. IMPRESSION: 1. Cardiomegaly and mild interstitial edema compatible with congestive heart failure. 2. Retrocardiac opacification likely represents  atelectasis. Electronically Signed   By: Marin Roberts M.D.   On: 12/31/2023 09:05   DG Chest Port 1 View Result Date: 12/29/2023 CLINICAL DATA:  Sepsis. EXAM: PORTABLE CHEST 1 VIEW COMPARISON:  Chest radiograph dated 10/10/2021. FINDINGS: No focal consolidation, pleural effusion, pneumothorax. Stable cardiomegaly. Atherosclerotic calcification of the aorta. Osteopenia with degenerative changes of the spine. No acute osseous pathology. IMPRESSION: 1. No active disease. 2. Cardiomegaly. Electronically Signed   By: Elgie Collard M.D.   On: 12/29/2023 17:49    Catarina Hartshorn, DO  [facility]  If 7PM-7AM, please contact night-coverage [email or web address] Password [email or web address] 01/01/2024, 10:15 AM   LOS: 3 days

## 2024-01-01 NOTE — Plan of Care (Signed)

## 2024-01-01 NOTE — Plan of Care (Signed)

## 2024-01-01 NOTE — Progress Notes (Signed)
 [facility]  Central line looks in good position no obvious ptx. good to use.  Algis Greenhouse MD

## 2024-01-01 NOTE — Consult Note (Addendum)
 Cardiology Consultation   Patient ID: [name] MRN: [id number]; DOB: 01/05/1941  Admit date: 12/29/2023 Date of Consult: 01/01/2024  PCP:  Caitlin [name], NP   [facility] Providers Cardiologist:  Caitlin Dell, MD   {  Patient Profile:   [name] is a 83 y.o. female with a hx of CAD (s/p DES to mid-RCA and DES to mid-LAD in 2006, DES to ISR of RCA in 2009, inferior STEMI in 07/2020 with ISR of RCA and treated with DES, s/p NSTEMI in 01/2021 with cath showing patent stents with no culprit lesions), chronic HFimpEF/ICM (EF 40-45% by echo in 01/2021, EF at 45-50% in 07/2021, at 55 to 60% in 04/2023), persistent atrial flutter (s/p ablation in 2014 with recurrent and rate-control pursued), history of PE (on Coumadin), HTN, HLD, Type 2 DM, Stage 3 CKD, prior CVA and aortic stenosis who is being seen 01/01/2024 for the evaluation of acute CHF at the request of Dr. Arbutus [name].  History of Present Illness:   [name] was examined by Caitlin Reedy, PA in 11/2023 and reported having intermittent lower extremity edema but no acute changes in her respiratory status. Weight was stable at 185 lbs and she was continued on Lasix 40 mg in AM/20 mg in PM.  She presented to [facility] ED on 12/29/2023 for evaluation of worsening shortness of breath and oxygen saturations had been in the 80's on room air upon EMS arrival to her home. Initial labs showed WBC 4.5, Hgb 10.2, platelets 230, Na+ 139, K+ 4.4 and creatinine 2.67 (baseline 1.9 - 2.0). INR significantly elevated at 7.1. BNP 686. Negative for COVID, influenza and RSV. Lactic Acid normal at 1.1.ABG showed pH at 7.36 but pCO2 elevated at 60 and Bicarb at 33.9. Blood cultures negative to date. CXR showed no active disease. EKG on admission showed rate controlled atrial flutter, heart rate 94 with IVCD.    She was admitted for further management of acute hypoxic respiratory failure and initially required BiPAP. Received IV Lasix 40  mg while in the ED and has been started on 40 mg twice daily.  Recorded net output of -1.7 L thus far and weight is listed as having trended up from 189 lbs to 200 lbs. Creatinine is trending down and at 2.45 today. ReDs Vest at 33 when checked yesterday. Full resp panel checked yesterday and negative as well. She did have a repeat CXR yesterday showing cardiomegaly and mild interstitial edema. Chest CT showed small bilateral pleural effusions and pulmonary edema. Was also noted to have a small to moderate pericardial effusion and low-grade, diffuse subcutaneous and mediastinal edema felt to be due to third spacing of fluid.  Coumadin has been held given supratherapeutic INR and she did receive Vitamin K on 3/18 as INR was at 10.3. This has improved to 1.4 today. In talking with RT and nursing staff, she had worsening respiratory distress this morning and was placed back on BiPAP. She has also been tachycardic this morning and appears Coreg was discontinued on 3/18 due to hypotension and she is scheduled to receive IV Lopressor later this morning. At this time, history is limited since she is on BiPAP and she reports being short of breath. Denies any specific chest pain or palpitations.  Unable to recall what her weight was prior to admission. No family currently at the bedside.    Past Medical History:  Diagnosis Date   Arthritis    Atrial flutter (HCC)    a. 06/2013  s/p RFCA.   Bilateral pulmonary embolism (HCC) 08/2008   a. Chronic coumadin.   Carotid arterial disease (HCC)    a. 03/2017 Carotid angio: RICA 15-20%, LICA 30-35%.   CHF (congestive heart failure) (HCC)    CKD (chronic kidney disease), stage III (HCC)    Coronary artery disease    a. 05/2005 Inf STEMI/PCI: severe LAD/LCX/RCA dzs->CABG advised, pt preferred PCI-->Cypher DES to mRCA & mLAD; b. 02/2008 PCI: ISR of RCA->Cypher DES. LAD patent; c. 07/2020 Inf STEMI/PCI: LM 30d, LAD 63m, D1 80(small), RI 75, LCX 80ost->distal (small), RCA  25p/m, 1103m/d (3.0x26 Resolute Onyx DES).   Essential hypertension    HFrEF (heart failure with reduced ejection fraction) (HCC)    a. 07/2020 Echo: EF 45-50% in setting of inf STEMI; b. 01/2021 Echo: EF 40-45%, glob HK, sev basal-septal LVH w/ grII DD. Nl RV fxn, RVSP 37.24mmHg, sev dil LA, mild MR, mod AS.   Hyperlipidemia    Ischemic cardiomyopathy    a. 07/2020 Echo: EF 45-50%; b. 01/2021 Echo: EF 40-45%.   Memory disorder 07/08/2017   Moderate aortic stenosis    a. 01/2021 Echo: Mild-mod AS w/ mean grad . AoV area 2.61m/s (Vmax). In setting of LV dysfxn, AS likely more moderate.   Myocardial infarction [facility])    Stroke (HCC)    Type 2 diabetes mellitus (HCC)    Vascular dementia [facility])     Past Surgical History:  Procedure Laterality Date   ATRIAL FLUTTER ABLATION N/A 06/17/2013   Procedure: ATRIAL FLUTTER ABLATION;  Surgeon: Caitlin Range, MD;  Location: [facility] CATH LAB;  Service: Cardiovascular;  Laterality: N/A;   CAROTID PTA/STENT INTERVENTION N/A 04/10/2017   Procedure: Carotid PTA/Stent Intervention;  Surgeon: Caitlin Needy, MD;  Location: [facility] INVASIVE CV LAB;  Service: Cardiovascular;  Laterality: N/A;   CHOLECYSTECTOMY     CORONARY STENT INTERVENTION N/A 07/14/2020   Procedure: CORONARY STENT INTERVENTION;  Surgeon: Caitlin Bollman, MD;  Location: [facility] INVASIVE CV LAB;  Service: Cardiovascular;  Laterality: N/A;   HIP SURGERY     KNEE ARTHROSCOPY WITH MEDIAL MENISECTOMY Left 09/25/2017   Procedure: KNEE ARTHROSCOPY WITH MEDIAL AND LATERAL MENISECTOMY;  Surgeon: Caitlin Hearing, MD;  Location: [facility] ORS;  Service: Orthopedics;  Laterality: Left;   LEFT HEART CATH AND CORONARY ANGIOGRAPHY N/A 07/14/2020   Procedure: LEFT HEART CATH AND CORONARY ANGIOGRAPHY;  Surgeon: Caitlin Bollman, MD;  Location: [facility] INVASIVE CV LAB;  Service: Cardiovascular;  Laterality: N/A;   RIGHT/LEFT HEART CATH AND CORONARY ANGIOGRAPHY N/A 02/05/2021   Procedure: RIGHT/LEFT HEART CATH AND CORONARY ANGIOGRAPHY;   Surgeon: Caitlin Gess, MD;  Location: [facility] INVASIVE CV LAB;  Service: Cardiovascular;  Laterality: N/A;     Home Medications:  Prior to Admission medications   Medication Sig Start Date End Date Taking? Authorizing Provider  albuterol (VENTOLIN HFA) 108 (90 Base) MCG/ACT inhaler Inhale 2 puffs into the lungs every 6 (six) hours as needed for wheezing or shortness of breath. 11/11/20   Sherryll Burger, Pratik D, DO  ALPRAZolam Prudy Feeler) 0.5 MG tablet Take 0.5 mg by mouth 3 (three) times daily as needed for anxiety or sleep. 12/26/23   [provider]  Ascorbic Acid (VITAMIN C GUMMIES PO) Take 2 each by mouth daily. 180 mg    [provider]  atorvastatin (LIPITOR) 40 MG tablet Take 1 tablet (40 mg total) by mouth daily. 02/07/21 11/30/96  Zigmund Daniel., MD  b complex vitamins capsule Take 1 capsule by mouth daily.  [provider]  carvedilol (COREG) 12.5 MG tablet Take 1 tablet by mouth twice daily 02/05/23   Iran Ouch, [place] M, PA-C  cetirizine (ZYRTEC) 10 MG tablet Take by mouth at bedtime. 05/09/23   [provider]  cholecalciferol (VITAMIN D3) 25 MCG (1000 UNIT) tablet Take 1,000 Units by mouth daily.    [provider]  clopidogrel (PLAVIX) 75 MG tablet Take 1 tablet by mouth once daily with breakfast 07/04/23   Jonelle Sidle, MD  Coenzyme Q10 (CO Q 10) 100 MG CAPS Take 100 mg by mouth daily.    [provider]  furosemide (LASIX) 20 MG tablet Take 40 mg am and 20 mg pm 12/09/23   Jonelle Sidle, MD  glipiZIDE (GLUCOTROL XL) 5 MG 24 hr tablet Take 5 mg by mouth in the morning and at bedtime. 09/12/20   [provider]  isosorbide mononitrate (IMDUR) 30 MG 24 hr tablet Take 1 tablet by mouth once daily 08/28/23   Jonelle Sidle, MD  [facility] OMEGA-3 KRILL OIL PO Take 1,000 mg by mouth daily. When she remembers    [provider]  nitroGLYCERIN (NITROSTAT) 0.4 MG SL tablet Place 1 tablet (0.4 mg total) under the  tongue every 5 (five) minutes x 3 doses as needed for chest pain. 08/03/20   Netta Neat., NP  pioglitazone (ACTOS) 30 MG tablet Take by mouth daily. 01/03/23   [provider]  Polyethyl Glycol-Propyl Glycol (SYSTANE OP) Apply 1 drop to eye 3 (three) times daily as needed (dry eyes).    [provider]  Semaglutide,0.25 or 0.5MG /DOS, (OZEMPIC, 0.25 OR 0.5 MG/DOSE,) 2 MG/1.5ML SOPN Inject 0.25 mg into the skin once a week.    [provider]  vitamin E 180 MG (400 UNITS) capsule Take 400 Units by mouth daily.    [provider]  warfarin (COUMADIN) 5 MG tablet Take 5 mg by mouth every evening. Except 2.5 mg on Sunday & Wednesday 05/31/18   [provider]    Inpatient Medications: Scheduled Meds:  ALPRAZolam  0.5 mg Oral BID   arformoterol  15 mcg Nebulization BID   ascorbic acid  500 mg Oral Daily   atorvastatin  40 mg Oral Daily   B-complex with vitamin C  1 tablet Oral Daily   budesonide (PULMICORT) nebulizer solution  0.5 mg Nebulization BID   Chlorhexidine Gluconate Cloth  6 each Topical Daily   cholecalciferol  1,000 Units Oral Daily   clopidogrel  75 mg Oral Q breakfast   furosemide  40 mg Intravenous Q12H   furosemide  40 mg Intravenous Once   ipratropium-albuterol  3 mL Nebulization Q6H   isosorbide mononitrate  30 mg Oral Daily   loratadine  10 mg Oral Daily   methylPREDNISolone (SOLU-MEDROL) injection  60 mg Intravenous Q12H   metoprolol tartrate  2.5 mg Intravenous Q6H   omega-3 acid ethyl esters  1 g Oral Daily   vitamin E  400 Units Oral Daily    PRN Meds: acetaminophen **OR** acetaminophen, albuterol, methocarbamol (ROBAXIN) injection, nitroGLYCERIN, mouth rinse, polyethylene glycol  Allergies:   No Known Allergies  Social History:   Social History   Tobacco Use   Smoking status: Former    Current packs/day: 0.00    Types: Cigarettes    Start date: 10/14/1968    Quit date: 10/14/1978    Years since quitting: 45.2    Smokeless tobacco: Never  Substance Use Topics   Alcohol use: No  Family History:    Family History  Problem Relation Age of Onset   Heart disease Other    Arthritis Other    Cancer Other    Diabetes Other    Heart failure Mother      ROS:  Please see the history of present illness. All other ROS reviewed and negative.     Physical Exam/Data:   Vitals:   01/01/24 0500 01/01/24 0600 01/01/24 0700 01/01/24 0743  BP: (!) 122/53 (!) 123/43 119/61   Pulse: (!) 117 (!) 108 (!) 125   Resp: 18 19 (!) 23   Temp:    98.5 F (36.9 C)  TempSrc:    Oral  SpO2: 94% 94% 92%   Weight:      Height:        Intake/Output Summary (Last 24 hours) at 01/01/2024 1046 Last data filed at 01/01/2024 0900 Gross per 24 hour  Intake 120 ml  Output 1400 ml  Net -1280 ml      12/31/2023    8:00 AM 12/31/2023    4:03 AM 12/30/2023    8:00 AM  Last 3 Weights  Weight (lbs) 200 lb 2.8 oz 200 lb 13.4 oz 189 lb 6 oz  Weight (kg) 90.8 kg 91.1 kg 85.9 kg     Body mass index is 33.31 kg/m.  General: Elderly female, currently on BiPAP. HEENT: normal Neck: JVD difficult to assess given her position but appears elevated. Vascular: No carotid bruits; Distal pulses 2+ bilaterally Cardiac:  normal S1, S2; irregular irregular. 2/6 SEM along sternal border. Lungs: Rales along bases bilaterally.  No wheezing. Abd: soft, nontender, no hepatomegaly  Ext: Trace lower extremity edema Musculoskeletal:  No deformities, BUE and BLE strength normal and equal Skin: warm and dry  Neuro:  CNs 2-12 intact, no focal abnormalities noted Psych:  Normal affect   EKG:  The EKG was personally reviewed and demonstrates: Rate-controlled atrial flutter, heart rate 94 with IVCD. Telemetry:  Telemetry was personally reviewed and demonstrates: Atrial fibrillation/flutter, HR in 90's to 120's.   Relevant CV Studies:  Echocardiogram: 04/2023 IMPRESSIONS    1. Left ventricular ejection fraction, by estimation, is 55  to 60%. The  left ventricle has normal function. The left ventricle has no regional  wall motion abnormalities. There is mild left ventricular hypertrophy.  Left ventricular diastolic parameters  are indeterminate.   2. Right ventricular systolic function is normal. The right ventricular  size is normal. There is normal pulmonary artery systolic pressure.   3. Left atrial size was severely dilated.   4. Right atrial size was moderately dilated.   5. Moderate pericardial effusion. The pericardial effusion is  circumferential. There is no evidence of cardiac tamponade.   6. The mitral valve is abnormal. Mild mitral valve regurgitation. No  evidence of mitral stenosis. Moderate mitral annular calcification.   7. The tricuspid valve is abnormal.   8. The aortic valve has an indeterminant number of cusps. There is severe  calcifcation of the aortic valve. There is severe thickening of the aortic  valve. Aortic valve regurgitation is not visualized. Mild aortic valve  stenosis. Aortic valve mean  gradient measures 15.0 mmHg. Aortic valve peak gradient measures 23.8  mmHg. Aortic valve area, by VTI measures 1.55 cm.   9. The inferior vena cava is dilated in size with >50% respiratory  variability, suggesting right atrial pressure of 8 mmHg.    Laboratory Data:  High Sensitivity Troponin:  No results for input(s): "TROPONINIHS" in  the last 720 hours.   Chemistry Recent Labs  Lab 12/30/23 0425 12/31/23 0453 01/01/24 0435  NA 142 142 144  K 4.1 4.0 4.1  CL 103 101 101  CO2 32 31 31  GLUCOSE 135* 71 191*  BUN 93* 92* 91*  CREATININE 2.44* 2.63* 2.45*  CALCIUM 8.7* 8.5* 8.9  MG  --  2.5* 2.6*  GFRNONAA 19* 18* 19*  ANIONGAP 7 10 12     Recent Labs  Lab 12/29/23 1645  PROT 6.8  ALBUMIN 3.4*  AST 22  ALT 24  ALKPHOS 56  BILITOT 1.3*   Hematology Recent Labs  Lab 12/30/23 0425 12/31/23 0453 01/01/24 0435  WBC 3.9* 4.5 5.1  RBC 2.96* 2.98*  2.96* 3.02*  HGB 8.8* 9.0*  9.4*  HCT 29.5* 29.6* 29.7*  MCV 99.7 99.3 98.3  MCH 29.7 30.2 31.1  MCHC 29.8* 30.4 31.6  RDW 15.6* 15.8* 15.9*  PLT 209 237 273   Thyroid  Recent Labs  Lab 12/31/23 0955  TSH 3.927  FREET4 1.10    BNP Recent Labs  Lab 12/29/23 1645 12/31/23 0955  BNP 686.0* 580.0*     Radiology/Studies:  CT CHEST WO CONTRAST Result Date: 12/31/2023 CLINICAL DATA:  Respiratory illness, acute respiratory failure with hypoxia and hypercapnia. EXAM: CT CHEST WITHOUT CONTRAST TECHNIQUE: Multidetector CT imaging of the chest was performed following the standard protocol without IV contrast. RADIATION DOSE REDUCTION: This exam was performed according to the departmental dose-optimization program which includes automated exposure control, adjustment of the mA and/or kV according to patient size and/or use of iterative reconstruction technique. COMPARISON:  Radiograph 12/31/2023 and CT chest 11/10/2020 FINDINGS: Cardiovascular: Dense atheromatous vascular calcification of the thoracic aorta, branch vessels, and coronary arteries. Dense mitral valve and moderate aortic valve calcifications. Small to moderate inferior pericardial effusion. Moderate cardiomegaly. Mediastinum/Nodes: Borderline enlarged 1.0 cm right lower paratracheal lymph node on image 53 series 2, previously 0.8 cm. Suspected mildly enlarged hilar lymph nodes. Low-level edema in the mediastinal adipose tissues and diffusely in the subcutaneous tissues. Lungs/Pleura: Small bilateral pleural effusions. Associated passive atelectasis in addition to disproportionate atelectasis in the left lower lobe and inferior lingula. Secondary pulmonary lobular interstitial accentuation with patchy ground-glass opacities in the lungs favoring pulmonary edema, less likely from atypical pneumonia or acute hypersensitivity pneumonitis. Hazy 0.8 cm ground-glass density nodule in the apicoposterior segment left upper lobe image 25 series 3 is stable from 11/10/2020 but  other part solid and subsolid nodules from that time have generally resolved. Upper Abdomen: Left kidney upper pole cysts as previously characterized. Dense atheromatous vascular calcification. Musculoskeletal: Degenerative glenohumeral arthropathy bilaterally. Mild upper thoracic kyphosis and thoracic spondylosis. IMPRESSION: 1. Small bilateral pleural effusions with associated passive atelectasis in addition to disproportionate atelectasis in the left lower lobe and inferior lingula. 2. Secondary pulmonary lobular interstitial accentuation with patchy ground-glass opacities in the lungs favoring pulmonary edema, less likely from atypical pneumonia or acute hypersensitivity pneumonitis. 3. Moderate cardiomegaly. Small to moderate inferior pericardial effusion. 4. Dense atheromatous vascular calcification of the thoracic aorta, branch vessels, and coronary arteries. 5. Low-grade diffuse subcutaneous and mediastinal edema, probably from third spacing of fluid, correlate with recent weight changes. 6. Dense mitral valve and moderate aortic valve calcifications. Correlate clinically in assessing for signs/symptoms of mitral valve dysfunction. 7. Hazy 0.8 by 0.6 cm ground-glass density nodule in the apicoposterior segment left upper lobe is stable from 11/10/2020 but other part solid and subsolid nodules from that time have generally resolved. Surveillance suggested with  noncontrast chest CT in 12 months time. 8. Aortic Atherosclerosis (ICD10-I70.0). Electronically Signed   By: Gaylyn Rong M.D.   On: 12/31/2023 17:24   DG CHEST PORT 1 VIEW Result Date: 12/31/2023 CLINICAL DATA:  Heart failure EXAM: PORTABLE CHEST 1 VIEW COMPARISON:  One-view chest x-ray 12/29/2023. FINDINGS: Stable cardiomegaly is noted. Atherosclerotic changes are present at the aortic arch. Mild interstitial edema is now present. Retrocardiac opacification is present. No other focal airspace disease is present. Remote fracture of the  proximal right humerus is again noted. IMPRESSION: 1. Cardiomegaly and mild interstitial edema compatible with congestive heart failure. 2. Retrocardiac opacification likely represents atelectasis. Electronically Signed   By: Marin Roberts M.D.   On: 12/31/2023 09:05   DG Chest Port 1 View Result Date: 12/29/2023 CLINICAL DATA:  Sepsis. EXAM: PORTABLE CHEST 1 VIEW COMPARISON:  Chest radiograph dated 10/10/2021. FINDINGS: No focal consolidation, pleural effusion, pneumothorax. Stable cardiomegaly. Atherosclerotic calcification of the aorta. Osteopenia with degenerative changes of the spine. No acute osseous pathology. IMPRESSION: 1. No active disease. 2. Cardiomegaly. Electronically Signed   By: Elgie Collard M.D.   On: 12/29/2023 17:49     Assessment and Plan:   1. Acute Hypoxic Respiratory Failure/Acute HFimpEF - Presented with acute hypoxic respiratory failure and recurrence this morning requiring BiPAP. Most recent echocardiogram in 2024 showed her EF had normalized to 55 to 60% and repeat imaging was performed this morning and results pending at this time. Chest CT yesterday was most consistent with CHF. Also noted to have a pericardial effusion and echo pending as above. - She has been receiving IV Lasix 40 mg twice daily and will write for an extra 40 mg currently. Follow I&O's along with daily weights. Her weight was previously at 185 lbs in 11/2023 and listed as 200 lbs the past few days. Will likely require escalation of baseline dosing of IV Lasix but assess response with extra dose today.   2. Persistent Atrial Flutter - She underwent prior ablation in 2014 but has been noted to have recurrence and rate control has been pursued.  Was on Coreg 12.5 mg twice daily prior to admission but this was held given hypotension. Rates are elevated at this time and she is scheduled to receive IV Lopressor later today as she is currently on BiPAP. Would switch back to oral AV nodal blocking  agents once off BiPAP and respiratory status has improved. If BP remains soft, may need to switch Coreg to Lopressor. - INR was supratherapeutic on admission and she ultimately required vitamin K as INR peaked at 10.3. I am unable to see where she is having regular INR checks as an outpatient and may benefit being from being switched to a DOAC prior to discharge if noncompliant with INR checks as an outpatient.  3.  CAD - She is s/p DES to mid-RCA and DES to mid-LAD in 2006, DES to ISR of RCA in 2009, inferior STEMI in 07/2020 with ISR of RCA and treated with DES and NSTEMI in 01/2021 with cath showing patent stents with no culprit lesions.  - No reports of recent chest pain. Repeat echocardiogram obtained this morning with results pending. Remains on Plavix 75 mg daily, Imdur 30 mg daily and Atorvastatin 40 mg daily.   4.  History of PE - She was on Coumadin prior to admission and was supratherapeutic as discussed above.  Repeat lower extremity dopplers being performed today to rule out a DVT. Would consider switching to a DOAC given noncompliance  with INR checks as an outpatient.   5. Aortic Stenosis  - This was mild by echo in 2024. Repeat echo performed today with results pending.   6. Acute on Chronic Stage 3-4 CKD - Baseline creatinine 1.9 - 2.0. At 2.67 on admission and at 2.45 today. Follow with diuresis.     Risk Assessment/Risk Scores:        New York Heart Association (NYHA) Functional Class NYHA Class IV   CHA2DS2-VASc Score = 7   This indicates a 11.2% annual risk of stroke. The patient's score is based upon: CHF History: 1 HTN History: 1 Diabetes History: 1 Stroke History: 0 Vascular Disease History: 1 Age Score: 2 Gender Score: 1     For questions or updates, please contact [facility] Please consult [email or web address] for contact info under    Signed, Ellsworth Lennox, PA-C  01/01/2024 10:46 AM   Attending note:  Patient seen and examined.  I  reviewed her records and discussed the case with the Patrick Jupiter, agree with her above findings.  Ms. Alberty presents with increasing shortness of breath and acute hypoxic respiratory failure with evidence of interstitial edema and pleural effusions by chest x-ray, BNP 686, and uncontrolled atrial fibrillation.  Viral respiratory panel negative.  She has acute chronic kidney injury as well with initial creatinine up to 2.67.  Initially placed on BiPAP and currently being treated with IV Lasix, Coreg held given relative hypotension.  Chest CT shows small bilateral pleural effusions, pulmonary edema (less likely atypical pneumonia or pneumonitis), small to moderate inferior pericardial effusion, third spacing of fluid, and mitral and aortic calcifications.  Follow-up echocardiogram today reveals LVEF 45 to 50% in the setting of rapid atrial fibrillation, moderately elevated RVSP of 40 mmHg, severe left atrial enlargement, small to moderate posterior pericardial effusion, mild mitral regurgitation, and moderate low-flow aortic stenosis with mean AV gradient 11.4 mmHg and dimensionless index 0.34.  Examination this afternoon she is short of breath, sitting up on the side of the bed wearing supplemental oxygen via nasal cannula at 10 L.  She is afebrile, heart rate 120-130 in atrial fibrillation by telemetry, systolic 100-180.  Lungs exhibit decreased breath sounds at the bases with expiratory wheezing, cardiac exam with irregularly irregular rhythm.  Pertinent lab work includes potassium 4.1, creatinine 2.45 with GFR 19, hemoglobin 9.7, platelets 273, INR 1.4.  ECG from March 17 showed atrial fibrillation with low voltage in the limb leads, IVCD and poor R wave progression.  Continue to support ventilatory status, expect she may still need BiPAP intermittently.  Currently on Lasix 40 mg IV twice daily, received additional dose this morning.  On Coumadin per pharmacy presently with subtherapeutic INR, she was  initially supratherapeutic and received vitamin K.  May need to start her on IV heparin as she transitions back to anticoagulation and in fact a DOAC may be a better choice.  LVEF mildly reduced in the setting of rapid atrial fibrillation, she has moderate low-flow aortic stenosis at this time as well.  Pericardial effusion is small to moderate and does not look to be hemodynamically significant.  Plan to start IV amiodarone mainly for heart rate control as blood pressure stabilizes and then transition back to AV nodal blockers as able.  Jonelle Sidle, M.D., F.A.C.C.

## 2024-01-01 NOTE — Progress Notes (Addendum)
 PHARMACY - ANTICOAGULATION CONSULT NOTE  Pharmacy Consult for warfarin/heparin while NPO Indication: atrial fibrillation/hx of PE  No Known Allergies  Patient Measurements: Height: 5\' 5"  (165.1 cm) Weight: 90.8 kg (200 lb 2.8 oz) IBW/kg (Calculated) : 57 Heparin Dosing Weight:   Vital Signs: Temp: 98.5 F (36.9 C) (03/20 0743) Temp Source: Oral (03/20 0743) BP: 119/61 (03/20 0700) Pulse Rate: 125 (03/20 0700)  Labs: Recent Labs    12/29/23 1645 12/30/23 0425 12/31/23 0453 01/01/24 0435  HGB 10.2* 8.8* 9.0* 9.4*  HCT 32.5* 29.5* 29.6* 29.7*  PLT 230 209 237 273  APTT 58*  --   --   --   LABPROT 61.2* 81.5* 38.4* 17.1*  INR 7.1* 10.3* 3.9* 1.4*  CREATININE 2.67* 2.44* 2.63* 2.45*    Estimated Creatinine Clearance: 19.4 mL/min (A) (by C-G formula based on SCr of 2.45 mg/dL (H)).   Medical History: Past Medical History:  Diagnosis Date   Arthritis    Atrial flutter (HCC)    a. 06/2013 s/p RFCA.   Bilateral pulmonary embolism (HCC) 08/2008   a. Chronic coumadin.   Carotid arterial disease (HCC)    a. 03/2017 Carotid angio: RICA 15-20%, LICA 30-35%.   CHF (congestive heart failure) (HCC)    CKD (chronic kidney disease), stage III (HCC)    Coronary artery disease    a. 05/2005 Inf STEMI/PCI: severe LAD/LCX/RCA dzs->CABG advised, pt preferred PCI-->Cypher DES to mRCA & mLAD; b. 02/2008 PCI: ISR of RCA->Cypher DES. LAD patent; c. 07/2020 Inf STEMI/PCI: LM 30d, LAD 88m, D1 80(small), RI 75, LCX 80ost->distal (small), RCA 25p/m, 153m/d (3.0x26 Resolute Onyx DES).   Essential hypertension    HFrEF (heart failure with reduced ejection fraction) (HCC)    a. 07/2020 Echo: EF 45-50% in setting of inf STEMI; b. 01/2021 Echo: EF 40-45%, glob HK, sev basal-septal LVH w/ grII DD. Nl RV fxn, RVSP 37.6mmHg, sev dil LA, mild MR, mod AS.   Hyperlipidemia    Ischemic cardiomyopathy    a. 07/2020 Echo: EF 45-50%; b. 01/2021 Echo: EF 40-45%.   Memory disorder 07/08/2017   Moderate aortic  stenosis    a. 01/2021 Echo: Mild-mod AS w/ mean grad . AoV area 2.71m/s (Vmax). In setting of LV dysfxn, AS likely more moderate.   Myocardial infarction (HCC)    Stroke (HCC)    Type 2 diabetes mellitus (HCC)    Vascular dementia (HCC)     Medications:  Medications Prior to Admission  Medication Sig Dispense Refill Last Dose/Taking   albuterol (VENTOLIN HFA) 108 (90 Base) MCG/ACT inhaler Inhale 2 puffs into the lungs every 6 (six) hours as needed for wheezing or shortness of breath. 8 g 2    ALPRAZolam (XANAX) 0.5 MG tablet Take 0.5 mg by mouth 3 (three) times daily as needed for anxiety or sleep.      Ascorbic Acid (VITAMIN C GUMMIES PO) Take 2 each by mouth daily. 180 mg      atorvastatin (LIPITOR) 40 MG tablet Take 1 tablet (40 mg total) by mouth daily. 30 tablet 1    b complex vitamins capsule Take 1 capsule by mouth daily.      carvedilol (COREG) 12.5 MG tablet Take 1 tablet by mouth twice daily 180 tablet 3    cetirizine (ZYRTEC) 10 MG tablet Take by mouth at bedtime.      cholecalciferol (VITAMIN D3) 25 MCG (1000 UNIT) tablet Take 1,000 Units by mouth daily.      clopidogrel (PLAVIX) 75 MG tablet Take  1 tablet by mouth once daily with breakfast 90 tablet 2    Coenzyme Q10 (CO Q 10) 100 MG CAPS Take 100 mg by mouth daily.      furosemide (LASIX) 20 MG tablet Take 40 mg am and 20 mg pm 90 tablet 3    glipiZIDE (GLUCOTROL XL) 5 MG 24 hr tablet Take 5 mg by mouth in the morning and at bedtime.      isosorbide mononitrate (IMDUR) 30 MG 24 hr tablet Take 1 tablet by mouth once daily 90 tablet 3    MEGARED OMEGA-3 KRILL OIL PO Take 1,000 mg by mouth daily. When she remembers      nitroGLYCERIN (NITROSTAT) 0.4 MG SL tablet Place 1 tablet (0.4 mg total) under the tongue every 5 (five) minutes x 3 doses as needed for chest pain. 25 tablet 3    pioglitazone (ACTOS) 30 MG tablet Take by mouth daily.      Polyethyl Glycol-Propyl Glycol (SYSTANE OP) Apply 1 drop to eye 3 (three) times  daily as needed (dry eyes).      Semaglutide,0.25 or 0.5MG /DOS, (OZEMPIC, 0.25 OR 0.5 MG/DOSE,) 2 MG/1.5ML SOPN Inject 0.25 mg into the skin once a week.      vitamin E 180 MG (400 UNITS) capsule Take 400 Units by mouth daily.      warfarin (COUMADIN) 5 MG tablet Take 5 mg by mouth every evening. Except 2.5 mg on Sunday & Wednesday       Assessment: Pharmacy consulted to dose warfarin in patient with atrial fibrillation and history of PE.  INR on admission elevated at 10.3 and given Vitamin K 5 mg PO.  INR today down to 1.4.  Home dose listed as 2.5 mg on Sun + Wed and 5 mg ROW from August 2024 anti-coag visit. Med rec currently still pending but will start at lower dose until can determine.  IV heparin now during transition back to warfarin or DOAC.  Goal of Therapy:  INR 2-3 Monitor platelets by anticoagulation protocol: Yes   Plan:  Warfarin held due to being NPO Heparin bolus 3000 units IV x 1  Start heparin infusion at 1200 units/hr Heparin level in 8 hours and daily. Continue to monitor H&H and platelets.  Judeth Cornfield, PharmD Clinical Pharmacist 01/01/2024 10:23 AM

## 2024-01-01 NOTE — Telephone Encounter (Signed)
 Pt called in asking to speak with nurse. Did not wish to go into detail.

## 2024-01-02 DIAGNOSIS — I5033 Acute on chronic diastolic (congestive) heart failure: Secondary | ICD-10-CM | POA: Diagnosis not present

## 2024-01-02 DIAGNOSIS — N170 Acute kidney failure with tubular necrosis: Secondary | ICD-10-CM | POA: Diagnosis not present

## 2024-01-02 DIAGNOSIS — J9601 Acute respiratory failure with hypoxia: Secondary | ICD-10-CM | POA: Diagnosis not present

## 2024-01-02 DIAGNOSIS — J9602 Acute respiratory failure with hypercapnia: Secondary | ICD-10-CM | POA: Diagnosis not present

## 2024-01-02 LAB — HEPATIC FUNCTION PANEL
ALT: 23 U/L (ref 0–44)
AST: 27 U/L (ref 15–41)
Albumin: 3.1 g/dL — ABNORMAL LOW (ref 3.5–5.0)
Alkaline Phosphatase: 54 U/L (ref 38–126)
Bilirubin, Direct: 0.2 mg/dL (ref 0.0–0.2)
Indirect Bilirubin: 1 mg/dL — ABNORMAL HIGH (ref 0.3–0.9)
Total Bilirubin: 1.2 mg/dL (ref 0.0–1.2)
Total Protein: 6.6 g/dL (ref 6.5–8.1)

## 2024-01-02 LAB — CBC
HCT: 31.9 % — ABNORMAL LOW (ref 36.0–46.0)
Hemoglobin: 9.9 g/dL — ABNORMAL LOW (ref 12.0–15.0)
MCH: 31 pg (ref 26.0–34.0)
MCHC: 31 g/dL (ref 30.0–36.0)
MCV: 100 fL (ref 80.0–100.0)
Platelets: 260 10*3/uL (ref 150–400)
RBC: 3.19 MIL/uL — ABNORMAL LOW (ref 3.87–5.11)
RDW: 15.4 % (ref 11.5–15.5)
WBC: 5.6 10*3/uL (ref 4.0–10.5)
nRBC: 0 % (ref 0.0–0.2)

## 2024-01-02 LAB — BASIC METABOLIC PANEL
Anion gap: 13 (ref 5–15)
BUN: 103 mg/dL — ABNORMAL HIGH (ref 8–23)
CO2: 28 mmol/L (ref 22–32)
Calcium: 8.8 mg/dL — ABNORMAL LOW (ref 8.9–10.3)
Chloride: 98 mmol/L (ref 98–111)
Creatinine, Ser: 2.93 mg/dL — ABNORMAL HIGH (ref 0.44–1.00)
GFR, Estimated: 15 mL/min — ABNORMAL LOW (ref >60)
Glucose, Bld: 390 mg/dL — ABNORMAL HIGH (ref 70–99)
Potassium: 4.6 mmol/L (ref 3.5–5.1)
Sodium: 139 mmol/L (ref 135–145)

## 2024-01-02 LAB — PROTIME-INR
INR: 1.3 — ABNORMAL HIGH (ref 0.8–1.2)
Prothrombin Time: 16 s — ABNORMAL HIGH (ref 11.4–15.2)

## 2024-01-02 LAB — GLUCOSE, CAPILLARY: Glucose-Capillary: 401 mg/dL — ABNORMAL HIGH (ref 70–99)

## 2024-01-02 LAB — HEPARIN LEVEL (UNFRACTIONATED): Heparin Unfractionated: 0.88 [IU]/mL — ABNORMAL HIGH (ref 0.30–0.70)

## 2024-01-02 LAB — MAGNESIUM: Magnesium: 2.7 mg/dL — ABNORMAL HIGH (ref 1.7–2.4)

## 2024-01-02 MED ORDER — ACETAMINOPHEN 325 MG PO TABS: 650.0000 mg | ORAL_TABLET | Freq: Four times a day (QID) | ORAL | Status: DC | PRN

## 2024-01-02 MED ORDER — POLYVINYL ALCOHOL 1.4 % OP SOLN: 1.0000 [drp] | Freq: Four times a day (QID) | OPHTHALMIC | Status: DC | PRN

## 2024-01-02 MED ORDER — ONDANSETRON HCL 4 MG/2ML IJ SOLN
4.0000 mg | Freq: Four times a day (QID) | INTRAMUSCULAR | Status: DC | PRN
  Administered 2024-01-02: 4 mg via INTRAVENOUS
  Filled 2024-01-02: qty 2

## 2024-01-02 MED ORDER — MORPHINE SULFATE (PF) 2 MG/ML IV SOLN: 2.0000 mg | INTRAVENOUS | Status: DC | PRN

## 2024-01-02 MED ORDER — GLYCOPYRROLATE 1 MG PO TABS: 1.0000 mg | ORAL_TABLET | ORAL | Status: DC | PRN

## 2024-01-02 MED ORDER — MORPHINE SULFATE (PF) 2 MG/ML IV SOLN: 1.0000 mg | Freq: Once | INTRAVENOUS | Status: DC | PRN

## 2024-01-02 MED ORDER — ACETAMINOPHEN 650 MG RE SUPP: 650.0000 mg | Freq: Four times a day (QID) | RECTAL | Status: DC | PRN

## 2024-01-02 MED ORDER — MORPHINE SULFATE (PF) 2 MG/ML IV SOLN
1.0000 mg | INTRAVENOUS | Status: DC | PRN
  Administered 2024-01-02 (×3): 1 mg via INTRAVENOUS
  Filled 2024-01-02 (×2): qty 1

## 2024-01-02 MED ORDER — LORAZEPAM 2 MG/ML IJ SOLN: 2.0000 mg | INTRAMUSCULAR | Status: DC | PRN

## 2024-01-02 MED ORDER — GLYCOPYRROLATE 0.2 MG/ML IJ SOLN: 0.2000 mg | INTRAMUSCULAR | Status: DC | PRN

## 2024-01-02 MED ORDER — MORPHINE 100MG IN NS 100ML (1MG/ML) PREMIX INFUSION
2.0000 mg/h | INTRAVENOUS | Status: DC
  Administered 2024-01-02: 1 mg/h via INTRAVENOUS
  Filled 2024-01-02: qty 100

## 2024-01-03 LAB — CULTURE, BLOOD (ROUTINE X 2)
Culture: NO GROWTH
Culture: NO GROWTH

## 2024-01-06 LAB — CULTURE, BLOOD (ROUTINE X 2)
Culture: NO GROWTH
Culture: NO GROWTH

## 2024-01-13 NOTE — Death Summary Note (Signed)
 DEATH SUMMARY   Patient Details  Name: [name] MRN: [id number] DOB: 1941-07-01 QMV:HQIONG, Caitlin Amend, NP Admission/Discharge Information   Admit Date:  12-30-2023  Date of Death: Date of Death: 01/03/2024  Time of Death: Time of Death: 1001  Length of Stay: 4   Principle Cause of death: acute on chronic HFimpEF  Hospital Diagnoses: Principal Problem:   Acute respiratory failure with hypoxia and hypercarbia (HCC) Active Problems:   Essential hypertension   Hypoglycemia   Acute congestive heart failure (HCC)   History of pulmonary embolism   Acute diastolic CHF (congestive heart failure) (HCC)   Type 2 diabetes mellitus with chronic kidney disease, without long-term current use of insulin (HCC)   Dyslipidemia   Pressure injury of skin   Acute on chronic heart failure with preserved ejection fraction (HFpEF) (HCC)   Acute renal failure superimposed on chronic kidney disease (HCC)   Poor intravenous access   Hospital Course: 83 y.o. female with medical history significant for diabetes mellitus type 2, hypertension, hyperlipidemia,, permanent atrial flutter status post ablation September 2014 , bilateral PE on Coumadin, stage III chronic kidney disease, coronary artery disease status post DES to the RCA 07/2020 , essential hypertension, HFrEF, vascular dementia and CVA, dyslipidemia and ischemic and myopathy, who presented to the ER with acute onset of worsening dyspnea with associated orthopnea without paroxysmal nocturnal dyspnea.  She has been having dyspnea on exertion.  No fever or chills.  No nausea or vomiting or abdominal pain.  She noted neck erythematous rash without itching.  No dysuria, oliguria or hematuria or flank pain.  Pt was admitted with acute heart failure and recurrent hypoglycemia from glipizide in the setting of chronic kidney disease.   Notably, the patient had an office visit with cardiology on 11/29/2023.  Her office weight at that time was 185  pounds The patient was started on IV furosemide with good diuresis.  Unfortunately, her respiratory status was slow to improve.  She continued alternate on and off BiPAP.  Her carvedilol was initially held secondary to soft blood pressures.  As result, her atrial fibrillation rate increased.  Cardiology was consulted to assist with management. On 01/01/2024, the patient had worsening shortness of breath.  She was placed on BiPAP.  Cardiology was consulted to assist with management.  Her furosemide dosing was escalated.  Unfortunately, the patient continued to have respiratory distress.  Amiodarone drip was started for her atrial fibrillation with RVR.  Unfortunately, the patient continued to have increased work of breathing.  Urine output was diminished despite escalating doses of furosemide.  Goals of care discussion was held with the patient's family.  Initially, they want the patient to be full code to allow for other out-of-state family to arrive to visit with the patient. Upon the patient's full family arrival at the bedside, further GOC was discussed.  The patient's family wish to transition the patient to focus on comfort measures care.  As such, all other blood work and radiographic studies were discontinued.  Comfort measures medications were initiated.  Assessment and Plan:  Acute respiratory failure with hypoxia and hypercarbia -Secondary to CHF -Initially required BiPAP -weaned to 2L, but back to BiPAP for increase WOB on 3/20 -Wean oxygen as tolerated -12/31/2023 VBG 7.4 2/56/47/36 -Obtain CT chest--small bilateral pleural effusions; lobular interstitial accentuation with patchy GGO, favor edema, LUL GG nodule -COVID/RSV/Flu--neg -check viral resp panel--neg -repeat CXR -PCT <0.10 -01/01/24--patient continued to have increased work of breathing despite being on BiPAP. -Doses of  furosemide were escalated. -Amiodarone drip was initiated for atrial fibrillation with RVR. -The patient  continued to have increased work of breathing. -After extensive goals of care discussions, the patient's care was ultimately transition to focus on comfort measures   Acute on chronic HFimpEF -05/13/2023 echo EF 55 to 60%, mild MR, no WMA, normal RVF -07/14/2020 echo EF 45 to 50%, G2DD -Repeat echo--results pending -Continue IV furosemide 40 mg bid -I's and O's incomplete -12/30/23 ReDS = 32 -12/31/23 ReDS = 33 -Now transition to comfort measures care   Supratherapeutic INR -Patient given vitamin K on 12/30/2023 -Monitor INR>>down to 1.4>>restart warfarin -No active signs of bleeding presently -Monitor hemoglobin--stable   Permanent atrial flutter -HR now up b/c coreg was held due to soft BPs -Hold carvedilol secondary to soft blood pressures -Holding warfarin temporarily secondary to supratherapeutic INR -Status post ablation September 2014 -Initiated on amiodarone on 01/01/2024 -Now transitioned to comfort measures   LUE edema -venous duplex--negative   Bronchospasm -was Start DuoNebs -was Start Solu-Medrol -was [employer] -Now transition to comfort measures care   Coronary artery disease with hx NSTEMI -No chest pain presently -DES to RCA 07/2020 -Continue Plavix -Continue Lipitor -Continue carvedilol   Diabetes mellitus type 2 with hypoglycemia -Patient has continued to be hypoglycemic>>improved now -Check cortisol--15.3 -Holding glipizide and Actos 12/30/23 A1C--8.3   History of PE -holding warfarin temporarily as discussed -now with INR 1.4>>restart warfarin   Hyperlipidemia -Continue statin   Cognitive impairment -pt able to perform ADLs with minimal assistance and uses walker at baseline   Goals of care -Transition to comfort measures after discussion with the patient's family -Comfort measures care initiated including morphine IV, Robinul, Ativan -Increase morphine drip to 2 mg/h -Increase morphine boluses prn agitation        Procedures:  right internal jugular TLC  Consultations: cardiology, general surgery  The results of significant diagnostics from this hospitalization (including imaging, microbiology, ancillary and laboratory) are listed below for reference.   Significant Diagnostic Studies: DG CHEST PORT 1 VIEW Result Date: 01/01/2024 CLINICAL DATA:  Insertion of tunneled central venous catheter with port. EXAM: PORTABLE CHEST 1 VIEW COMPARISON:  Chest radiograph earlier today FINDINGS: Tip of the right internal jugular central venous catheter overlies the lower SVC. No pneumothorax. Cardiomegaly is unchanged. Worsening pulmonary edema. Retrocardiac opacity, ill-defined right lung base opacity and small effusions. IMPRESSION: 1. Tip of the right internal jugular central venous catheter overlies the lower SVC. No pneumothorax. 2. Worsening pulmonary edema. 3. Unchanged cardiomegaly and pleural effusions. Electronically Signed   By: Narda [name] M.D.   On: 01/01/2024 17:22   DG Chest Port 1 View Result Date: 01/01/2024 CLINICAL DATA:  Atrial flutter. Status post ablation. On Coumadin. Previous PE EXAM: PORTABLE CHEST 1 VIEW COMPARISON:  X-ray 01/01/2024 earlier and older FINDINGS: Stable enlarged cardiopericardial silhouette calcified aorta. Vascular congestion and some edema. Tiny effusions. Left retrocardiac opacity also seen. No pneumothorax. Overlapping cardiac leads. Degenerative changes along the spine. Calcifications along the tracheobronchial tree. IMPRESSION: No significant oval change. Enlarged heart with congestion and edema. Lung base opacities with effusions. Electronically Signed   By: Karen Kays M.D.   On: 01/01/2024 16:05   US Venous Img Upper Uni Left (DVT) Result Date: 01/01/2024 CLINICAL DATA:  Left upper arm edema EXAM: LEFT UPPER EXTREMITY VENOUS DOPPLER ULTRASOUND TECHNIQUE: Gray-scale sonography with graded compression, as well as color Doppler and duplex ultrasound were performed to evaluate the upper  extremity deep venous system from the level of the  subclavian vein and including the jugular, axillary, basilic, radial, ulnar and upper cephalic vein. Spectral Doppler was utilized to evaluate flow at rest and with distal augmentation maneuvers. COMPARISON:  03/19/2023 FINDINGS: Contralateral Subclavian Vein: Respiratory phasicity is normal and symmetric with the symptomatic side. No evidence of thrombus. Normal compressibility. Internal Jugular Vein: No evidence of thrombus. Normal compressibility, respiratory phasicity and response to augmentation. Subclavian Vein: No evidence of thrombus. Normal compressibility, respiratory phasicity and response to augmentation. Axillary Vein: No evidence of thrombus. Normal compressibility, respiratory phasicity and response to augmentation. Cephalic Vein: No evidence of thrombus. Normal compressibility, respiratory phasicity and response to augmentation. Basilic Vein: No evidence of thrombus. Normal compressibility, respiratory phasicity and response to augmentation. Brachial Veins: No evidence of thrombus. Normal compressibility, respiratory phasicity and response to augmentation. Radial Veins: No evidence of thrombus. Normal compressibility, respiratory phasicity and response to augmentation. Ulnar Veins: No evidence of thrombus. Normal compressibility, respiratory phasicity and response to augmentation. Venous Reflux:  None visualized. Other Findings:  None visualized. IMPRESSION: 1. No evidence of deep venous thrombosis within the left upper extremity. Electronically Signed   By: Sharlet Salina M.D.   On: 01/01/2024 15:04   ECHOCARDIOGRAM COMPLETE Result Date: 01/01/2024    ECHOCARDIOGRAM REPORT   Patient Name:   ARMINE RIZZOLO Date of Exam: 01/01/2024 Medical Rec #:  [id number]        Height:       65.0 in Accession #:    [id number]       Weight:       200.2 lb Date of Birth:  16-Apr-1941        BSA:          1.979 m Patient Age:    82 years         BP:            117/74 mmHg Patient Gender: F                HR:           108 bpm. [facility] Procedure: 2D Echo, Cardiac Doppler and Color Doppler (Both Spectral and Color            Flow Doppler were utilized during procedure). Indications:     CHF-acute diastolic  History:         Patient has prior history of Echocardiogram examinations, most                  recent 05/13/2023. Cardiomyopathy and CHF, Previous Myocardial                  Infarction and CAD, Stroke and TIA, Arrythmias:Atrial Flutter,                  Signs/Symptoms:Chest Pain; Risk Factors:Hypertension, Diabetes                  and Dyslipidemia. CKD.  Sonographer:     Vern Claude Referring Phys:  [id number] Cleora Fleet Diagnosing Phys: Nona Dell MD IMPRESSIONS  1. Left ventricular ejection fraction, by estimation, is 45 to 50%. The left ventricle has mildly decreased function. The left ventricle has no regional wall motion abnormalities. There is mild concentric left ventricular hypertrophy. Left ventricular diastolic parameters are indeterminate.  2. Right ventricular systolic function is low normal. The right ventricular size is normal. There is moderately elevated pulmonary artery systolic pressure. The estimated right ventricular systolic pressure is 48.4 mmHg.  3. Left atrial  size was severely dilated.  4. Left pleural effusion evident. Small to moderate pericardial effusion. The pericardial effusion is posterior to the left ventricle.  5. The mitral valve is degenerative. Mild mitral valve regurgitation. Moderate mitral annular calcification.  6. The aortic valve is tricuspid. There is moderate calcification of the aortic valve. Aortic valve regurgitation is not visualized. Moderate aortic valve stenosis, low flow. Aortic valve mean gradient measures 11.4 mmHg. Dimentionless index 0.34.  7. The inferior vena cava is normal in size with <50% respiratory variability, suggesting right atrial pressure of 8 mmHg. Comparison(s): Prior  images reviewed side by side. LVEF 45-50% range in the setting of atrial fibrillation with RVR. FINDINGS  Left Ventricle: Left ventricular ejection fraction, by estimation, is 45 to 50%. The left ventricle has mildly decreased function. The left ventricle has no regional wall motion abnormalities. The left ventricular internal cavity size was normal in size. There is mild concentric left ventricular hypertrophy. Left ventricular diastolic function could not be evaluated due to atrial fibrillation. Left ventricular diastolic parameters are indeterminate. Right Ventricle: The right ventricular size is normal. Right vetricular wall thickness was not well visualized. Right ventricular systolic function is low normal. There is moderately elevated pulmonary artery systolic pressure. The tricuspid regurgitant velocity is 3.18 m/s, and with an assumed right atrial pressure of 8 mmHg, the estimated right ventricular systolic pressure is 48.4 mmHg. Left Atrium: Left atrial size was severely dilated. Right Atrium: Right atrial size was normal in size. Pericardium: Left pleural effusion evident. A moderately sized pericardial effusion is present. The pericardial effusion is posterior to the left ventricle. Mitral Valve: The mitral valve is degenerative in appearance. There is mild thickening of the mitral valve leaflet(s). There is mild calcification of the mitral valve leaflet(s). Moderate mitral annular calcification. Mild mitral valve regurgitation. MV peak gradient, 13.5 mmHg. The mean mitral valve gradient is 6.0 mmHg. Tricuspid Valve: The tricuspid valve is grossly normal. Tricuspid valve regurgitation is mild. Aortic Valve: The aortic valve is tricuspid. There is moderate calcification of the aortic valve. There is mild aortic valve annular calcification. Aortic valve regurgitation is not visualized. Moderate aortic stenosis is present. Aortic valve mean gradient measures 11.4 mmHg. Aortic valve peak gradient measures  20.8 mmHg. Aortic valve area, by VTI measures 1.08 cm. Pulmonic Valve: The pulmonic valve was grossly normal. Pulmonic valve regurgitation is trivial. Aorta: The aortic root and ascending aorta are structurally normal, with no evidence of dilitation. Venous: The inferior vena cava is normal in size with less than 50% respiratory variability, suggesting right atrial pressure of 8 mmHg. IAS/Shunts: No atrial level shunt detected by color flow Doppler. Additional Comments: 3D was performed not requiring image post processing on an independent workstation and was indeterminate.  LEFT VENTRICLE PLAX 2D LVIDd:         4.80 cm LVIDs:         3.40 cm LV PW:         1.30 cm LV IVS:        1.00 cm LVOT diam:     2.00 cm LV SV:         44 LV SV Index:   22 LVOT Area:     3.14 cm  LV Volumes (MOD) LV vol d, MOD A2C: 113.0 ml LV vol d, MOD A4C: 177.0 ml LV vol s, MOD A2C: 41.5 ml LV vol s, MOD A4C: 64.0 ml LV SV MOD A2C:     71.5 ml LV SV MOD A4C:  177.0 ml LV SV MOD BP:      102.2 ml RIGHT VENTRICLE             IVC RV Basal diam:  4.10 cm     IVC diam: 1.80 cm RV Mid diam:    2.80 cm RV S prime:     14.50 cm/s TAPSE (M-mode): 1.4 cm LEFT ATRIUM              Index        RIGHT ATRIUM           Index LA diam:        5.20 cm  2.63 cm/m   RA Area:     15.70 cm LA Vol (A2C):   123.0 ml 62.16 ml/m  RA Volume:   35.60 ml  17.99 ml/m LA Vol (A4C):   139.0 ml 70.24 ml/m LA Biplane Vol: 139.0 ml 70.24 ml/m  AORTIC VALVE                     PULMONIC VALVE AV Area (Vmax):    1.16 cm      PV Vmax:       1.10 m/s AV Area (Vmean):   1.08 cm      PV Peak grad:  4.9 mmHg AV Area (VTI):     1.08 cm AV Vmax:           228.20 cm/s AV Vmean:          155.800 cm/s AV VTI:            0.408 m AV Peak Grad:      20.8 mmHg AV Mean Grad:      11.4 mmHg LVOT Vmax:         84.00 cm/s LVOT Vmean:        53.750 cm/s LVOT VTI:          0.140 m LVOT/AV VTI ratio: 0.34  AORTA Ao Root diam: 3.30 cm Ao Asc diam:  2.90 cm MITRAL VALVE                 TRICUSPID VALVE MV Area (PHT): 6.54 cm     TR Peak grad:   40.4 mmHg MV Area VTI:   1.77 cm     TR Vmax:        318.00 cm/s MV Peak grad:  13.5 mmHg MV Mean grad:  6.0 mmHg     SHUNTS MV Vmax:       1.84 m/s     Systemic VTI:  0.14 m MV Vmean:      110.0 cm/s   Systemic Diam: 2.00 cm MV Decel Time: 116 msec MR Peak grad: 76.4 mmHg MR Mean grad: 51.0 mmHg MR Vmax:      437.00 cm/s MR Vmean:     337.0 cm/s MV E velocity: 198.00 cm/s Nona Dell MD Electronically signed by Nona Dell MD Signature Date/Time: 01/01/2024/1:57:36 PM    Final (Updated)    DG CHEST PORT 1 VIEW Result Date: 01/01/2024 CLINICAL DATA:  Respiratory distress. EXAM: PORTABLE CHEST 1 VIEW COMPARISON:  December 31, 2023. FINDINGS: Stable cardiomegaly. Increased bibasilar atelectasis or edema is noted with associated pleural effusions. Bony thorax is unremarkable. IMPRESSION: Increased bibasilar opacities as noted above. Electronically Signed   By: Lupita Raider M.D.   On: 01/01/2024 11:02   CT CHEST WO CONTRAST Result Date: 12/31/2023 CLINICAL DATA:  Respiratory illness, acute respiratory failure with hypoxia and hypercapnia. EXAM:  CT CHEST WITHOUT CONTRAST TECHNIQUE: Multidetector CT imaging of the chest was performed following the standard protocol without IV contrast. RADIATION DOSE REDUCTION: This exam was performed according to the departmental dose-optimization program which includes automated exposure control, adjustment of the mA and/or kV according to patient size and/or use of iterative reconstruction technique. COMPARISON:  Radiograph 12/31/2023 and CT chest 11/10/2020 FINDINGS: Cardiovascular: Dense atheromatous vascular calcification of the thoracic aorta, branch vessels, and coronary arteries. Dense mitral valve and moderate aortic valve calcifications. Small to moderate inferior pericardial effusion. Moderate cardiomegaly. Mediastinum/Nodes: Borderline enlarged 1.0 cm right lower paratracheal lymph node on image 53  series 2, previously 0.8 cm. Suspected mildly enlarged hilar lymph nodes. Low-level edema in the mediastinal adipose tissues and diffusely in the subcutaneous tissues. Lungs/Pleura: Small bilateral pleural effusions. Associated passive atelectasis in addition to disproportionate atelectasis in the left lower lobe and inferior lingula. Secondary pulmonary lobular interstitial accentuation with patchy ground-glass opacities in the lungs favoring pulmonary edema, less likely from atypical pneumonia or acute hypersensitivity pneumonitis. Hazy 0.8 cm ground-glass density nodule in the apicoposterior segment left upper lobe image 25 series 3 is stable from 11/10/2020 but other part solid and subsolid nodules from that time have generally resolved. Upper Abdomen: Left kidney upper pole cysts as previously characterized. Dense atheromatous vascular calcification. Musculoskeletal: Degenerative glenohumeral arthropathy bilaterally. Mild upper thoracic kyphosis and thoracic spondylosis. IMPRESSION: 1. Small bilateral pleural effusions with associated passive atelectasis in addition to disproportionate atelectasis in the left lower lobe and inferior lingula. 2. Secondary pulmonary lobular interstitial accentuation with patchy ground-glass opacities in the lungs favoring pulmonary edema, less likely from atypical pneumonia or acute hypersensitivity pneumonitis. 3. Moderate cardiomegaly. Small to moderate inferior pericardial effusion. 4. Dense atheromatous vascular calcification of the thoracic aorta, branch vessels, and coronary arteries. 5. Low-grade diffuse subcutaneous and mediastinal edema, probably from third spacing of fluid, correlate with recent weight changes. 6. Dense mitral valve and moderate aortic valve calcifications. Correlate clinically in assessing for signs/symptoms of mitral valve dysfunction. 7. Hazy 0.8 by 0.6 cm ground-glass density nodule in the apicoposterior segment left upper lobe is stable from  11/10/2020 but other part solid and subsolid nodules from that time have generally resolved. Surveillance suggested with noncontrast chest CT in 12 months time. 8. Aortic Atherosclerosis (ICD10-I70.0). Electronically Signed   By: Gaylyn Rong M.D.   On: 12/31/2023 17:24   DG CHEST PORT 1 VIEW Result Date: 12/31/2023 CLINICAL DATA:  Heart failure EXAM: PORTABLE CHEST 1 VIEW COMPARISON:  One-view chest x-ray 12/29/2023. FINDINGS: Stable cardiomegaly is noted. Atherosclerotic changes are present at the aortic arch. Mild interstitial edema is now present. Retrocardiac opacification is present. No other focal airspace disease is present. Remote fracture of the proximal right humerus is again noted. IMPRESSION: 1. Cardiomegaly and mild interstitial edema compatible with congestive heart failure. 2. Retrocardiac opacification likely represents atelectasis. Electronically Signed   By: Marin Roberts M.D.   On: 12/31/2023 09:05   DG Chest Port 1 View Result Date: 12/29/2023 CLINICAL DATA:  Sepsis. EXAM: PORTABLE CHEST 1 VIEW COMPARISON:  Chest radiograph dated 10/10/2021. FINDINGS: No focal consolidation, pleural effusion, pneumothorax. Stable cardiomegaly. Atherosclerotic calcification of the aorta. Osteopenia with degenerative changes of the spine. No acute osseous pathology. IMPRESSION: 1. No active disease. 2. Cardiomegaly. Electronically Signed   By: Elgie Collard M.D.   On: 12/29/2023 17:49    Microbiology: Recent Results (from the past 240 hours)  Urine Culture     Status: Abnormal   Collection Time: 12/29/23  4:49 PM   Specimen: Urine, Random  Result Value Ref Range Status   Specimen Description   Final    URINE, RANDOM Performed at [facility], [address], [place], [place] [address]    Special Requests   Final    NONE Reflexed from [id number] Performed at [facility], [address] [place], [place] [address]    Culture (A)  Final    <10,000 COLONIES/mL INSIGNIFICANT  GROWTH Performed at [facility], [address] [place], [place] [address]    Report Status 12/31/2023 FINAL  Final  Resp panel by RT-PCR (RSV, Flu A&B, Covid) Anterior Nasal Swab     Status: None   Collection Time: 12/29/23  5:06 PM   Specimen: Anterior Nasal Swab  Result Value Ref Range Status   SARS Coronavirus 2 by RT PCR NEGATIVE NEGATIVE Final    Comment: (NOTE) SARS-CoV-2 target nucleic acids are NOT DETECTED.  The SARS-CoV-2 RNA is generally detectable in upper respiratory specimens during the acute phase of infection. The lowest concentration of SARS-CoV-2 viral copies this assay can detect is 138 copies/mL. A negative result does not preclude SARS-Cov-2 infection and should not be used as the sole basis for treatment or other patient management decisions. A negative result may occur with  improper specimen collection/handling, submission of specimen other than nasopharyngeal swab, presence of viral mutation(s) within the areas targeted by this assay, and inadequate number of viral copies(<138 copies/mL). A negative result must be combined with clinical observations, patient history, and epidemiological information. The expected result is Negative.  Fact Sheet for Patients:  BloggerCourse.com  Fact Sheet for Healthcare Providers:  SeriousBroker.it  This test is no t yet approved or cleared by the [place] FDA and  has been authorized for detection and/or diagnosis of SARS-CoV-2 by FDA under an Emergency Use Authorization (EUA). This EUA will remain  in effect (meaning this test can be used) for the duration of the COVID-19 declaration under Section 564(b)(1) of the Act, 21 U.S.C.section 360bbb-3(b)(1), unless the authorization is terminated  or revoked sooner.       Influenza A by PCR NEGATIVE NEGATIVE Final   Influenza B by PCR NEGATIVE NEGATIVE Final    Comment: (NOTE) The Xpert Xpress  SARS-CoV-2/FLU/RSV plus assay is intended as an aid in the diagnosis of influenza from Nasopharyngeal swab specimens and should not be used as a sole basis for treatment. Nasal washings and aspirates are unacceptable for Xpert Xpress SARS-CoV-2/FLU/RSV testing.  Fact Sheet for Patients: BloggerCourse.com  Fact Sheet for Healthcare Providers: SeriousBroker.it  This test is not yet approved or cleared by the [place] FDA and has been authorized for detection and/or diagnosis of SARS-CoV-2 by FDA under an Emergency Use Authorization (EUA). This EUA will remain in effect (meaning this test can be used) for the duration of the COVID-19 declaration under Section 564(b)(1) of the Act, 21 U.S.C. section 360bbb-3(b)(1), unless the authorization is terminated or revoked.     Resp Syncytial Virus by PCR NEGATIVE NEGATIVE Final    Comment: (NOTE) Fact Sheet for Patients: BloggerCourse.com  Fact Sheet for Healthcare Providers: SeriousBroker.it  This test is not yet approved or cleared by the [place] FDA and has been authorized for detection and/or diagnosis of SARS-CoV-2 by FDA under an Emergency Use Authorization (EUA). This EUA will remain in effect (meaning this test can be used) for the duration of the COVID-19 declaration under Section 564(b)(1) of the Act, 21 U.S.C. section 360bbb-3(b)(1), unless the authorization  is terminated or revoked.  Performed at [facility], [address], [place], [place] [address]   Blood Culture (routine x 2)     Status: None (Preliminary result)   Collection Time: 12/29/23  5:26 PM   Specimen: BLOOD  Result Value Ref Range Status   Specimen Description BLOOD BLOOD LEFT FOREARM  Final   Special Requests   Final    BOTTLES DRAWN AEROBIC ONLY Blood Culture results may not be optimal due to an inadequate volume of blood received in culture  bottles   Culture   Final    NO GROWTH 4 DAYS Performed at [facility], [address] [place], [place] [address]    Report Status PENDING  Incomplete  Blood Culture (routine x 2)     Status: None (Preliminary result)   Collection Time: 12/29/23  5:26 PM   Specimen: BLOOD  Result Value Ref Range Status   Specimen Description BLOOD BLOOD RIGHT FOREARM  Final   Special Requests   Final    BOTTLES DRAWN AEROBIC AND ANAEROBIC Blood Culture results may not be optimal due to an inadequate volume of blood received in culture bottles   Culture   Final    NO GROWTH 4 DAYS Performed at [facility], [address] [place], [place] [address]    Report Status PENDING  Incomplete  MRSA Next Gen by PCR, Nasal     Status: None   Collection Time: 12/30/23  2:18 AM   Specimen: Nasal Mucosa; Nasal Swab  Result Value Ref Range Status   MRSA by PCR Next Gen NOT DETECTED NOT DETECTED Final    Comment: (NOTE) The GeneXpert MRSA Assay (FDA approved for NASAL specimens only), is one component of a comprehensive MRSA colonization surveillance program. It is not intended to diagnose MRSA infection nor to guide or monitor treatment for MRSA infections. Test performance is not FDA approved in patients less than 38 years old. Performed at [facility], [address] [place], [place] [address]   Respiratory (~20 pathogens) panel by PCR     Status: None   Collection Time: 12/31/23 12:35 PM   Specimen: Nasopharyngeal Swab; Respiratory  Result Value Ref Range Status   Adenovirus NOT DETECTED NOT DETECTED Final   Coronavirus 229E NOT DETECTED NOT DETECTED Final    Comment: (NOTE) The Coronavirus on the Respiratory Panel, DOES NOT test for the novel  Coronavirus (2019 nCoV)    Coronavirus HKU1 NOT DETECTED NOT DETECTED Final   Coronavirus NL63 NOT DETECTED NOT DETECTED Final   Coronavirus OC43 NOT DETECTED NOT DETECTED Final   Metapneumovirus NOT DETECTED NOT DETECTED Final   Rhinovirus / Enterovirus NOT  DETECTED NOT DETECTED Final   Influenza A NOT DETECTED NOT DETECTED Final   Influenza B NOT DETECTED NOT DETECTED Final   Parainfluenza Virus 1 NOT DETECTED NOT DETECTED Final   Parainfluenza Virus 2 NOT DETECTED NOT DETECTED Final   Parainfluenza Virus 3 NOT DETECTED NOT DETECTED Final   Parainfluenza Virus 4 NOT DETECTED NOT DETECTED Final   Respiratory Syncytial Virus NOT DETECTED NOT DETECTED Final   Bordetella pertussis NOT DETECTED NOT DETECTED Final   Bordetella Parapertussis NOT DETECTED NOT DETECTED Final   Chlamydophila pneumoniae NOT DETECTED NOT DETECTED Final   Mycoplasma pneumoniae NOT DETECTED NOT DETECTED Final    Comment: Performed at [facility], [address], [place], [place] [address]  Culture, blood (Routine X 2) w Reflex to ID Panel     Status: None (Preliminary  result)   Collection Time: 01/01/24  7:48 PM   Specimen: Right Antecubital; Blood  Result Value Ref Range Status   Specimen Description   Final    RIGHT ANTECUBITAL BOTTLES DRAWN AEROBIC AND ANAEROBIC   Special Requests   Final    Blood Culture results may not be optimal due to an inadequate volume of blood received in culture bottles   Culture   Final    NO GROWTH < 12 HOURS Performed at [facility], [address], [place], [place] [address]    Report Status PENDING  Incomplete  Culture, blood (Routine X 2) w Reflex to ID Panel     Status: None (Preliminary result)   Collection Time: 01/01/24  7:48 PM   Specimen: BLOOD RIGHT FOREARM  Result Value Ref Range Status   Specimen Description   Final    BLOOD RIGHT FOREARM BOTTLES DRAWN AEROBIC AND ANAEROBIC   Special Requests   Final    Blood Culture results may not be optimal due to an inadequate volume of blood received in culture bottles   Culture   Final    NO GROWTH < 12 HOURS Performed at [facility], [address], [place], [place] [address]    Report Status PENDING  Incomplete    Time spent: 35 minutes  Signed: Catarina Hartshorn,  MD 

## 2024-01-13 NOTE — Progress Notes (Signed)
 PHARMACY - ANTICOAGULATION CONSULT NOTE  Pharmacy Consult for warfarin/heparin while NPO Indication: atrial fibrillation/hx of PE  No Known Allergies  Patient Measurements: Height: 5\' 5"  (165.1 cm) Weight: 90.8 kg (200 lb 2.8 oz) IBW/kg (Calculated) : 57 Heparin Dosing Weight:   Vital Signs: Temp: 97.8 F (36.6 C) (03/20 2335) Temp Source: Axillary (03/20 2335) BP: 131/96 (03/21 0000) Pulse Rate: 106 (03/21 0000)  Labs: Recent Labs    12/30/23 0425 12/31/23 0453 01/01/24 0435  0025  HGB 8.8* 9.0* 9.4* 9.9*  HCT 29.5* 29.6* 29.7* 31.9*  PLT 209 237 273 260  LABPROT 81.5* 38.4* 17.1* 16.0*  INR 10.3* 3.9* 1.4* 1.3*  HEPARINUNFRC  --   --   --  0.88*  CREATININE 2.44* 2.63* 2.45*  --     Estimated Creatinine Clearance: 19.4 mL/min (A) (by C-G formula based on SCr of 2.45 mg/dL (H)).   Medical History: Past Medical History:  Diagnosis Date   Arthritis    Atrial flutter (HCC)    a. 06/2013 s/p RFCA.   Bilateral pulmonary embolism (HCC) 08/2008   a. Chronic coumadin.   Carotid arterial disease (HCC)    a. 03/2017 Carotid angio: RICA 15-20%, LICA 30-35%.   CHF (congestive heart failure) (HCC)    CKD (chronic kidney disease), stage III (HCC)    Coronary artery disease    a. 05/2005 Inf STEMI/PCI: severe LAD/LCX/RCA dzs->CABG advised, pt preferred PCI-->Cypher DES to mRCA & mLAD; b. 02/2008 PCI: ISR of RCA->Cypher DES. LAD patent; c. 07/2020 Inf STEMI/PCI: LM 30d, LAD 81m, D1 80(small), RI 75, LCX 80ost->distal (small), RCA 25p/m, 176m/d (3.0x26 Resolute Onyx DES).   Essential hypertension    HFrEF (heart failure with reduced ejection fraction) (HCC)    a. 07/2020 Echo: EF 45-50% in setting of inf STEMI; b. 01/2021 Echo: EF 40-45%, glob HK, sev basal-septal LVH w/ grII DD. Nl RV fxn, RVSP 37.56mmHg, sev dil LA, mild MR, mod AS.   Hyperlipidemia    Ischemic cardiomyopathy    a. 07/2020 Echo: EF 45-50%; b. 01/2021 Echo: EF 40-45%.   Memory disorder 07/08/2017    Moderate aortic stenosis    a. 01/2021 Echo: Mild-mod AS w/ mean grad . AoV area 2.24m/s (Vmax). In setting of LV dysfxn, AS likely more moderate.   Myocardial infarction (HCC)    Stroke (HCC)    Type 2 diabetes mellitus (HCC)    Vascular dementia (HCC)     Medications:  Medications Prior to Admission  Medication Sig Dispense Refill Last Dose/Taking   albuterol (VENTOLIN HFA) 108 (90 Base) MCG/ACT inhaler Inhale 2 puffs into the lungs every 6 (six) hours as needed for wheezing or shortness of breath. 8 g 2 12/25/2023   ALPRAZolam (XANAX) 0.5 MG tablet Take 0.5 mg by mouth 3 (three) times daily as needed for anxiety or sleep.   12/29/2023   Ascorbic Acid (VITAMIN C GUMMIES PO) Take 2 each by mouth daily. 180 mg   12/29/2023   atorvastatin (LIPITOR) 40 MG tablet Take 1 tablet (40 mg total) by mouth daily. 30 tablet 1 12/29/2023   b complex vitamins capsule Take 1 capsule by mouth daily.   12/29/2023   carvedilol (COREG) 12.5 MG tablet Take 1 tablet by mouth twice daily 180 tablet 3 12/29/2023   cetirizine (ZYRTEC) 10 MG tablet Take by mouth at bedtime.   12/28/2023   cholecalciferol (VITAMIN D3) 25 MCG (1000 UNIT) tablet Take 1,000 Units by mouth daily.   12/29/2023   Coenzyme Q10 (CO Q  10) 100 MG CAPS Take 100 mg by mouth daily.   12/29/2023   furosemide (LASIX) 20 MG tablet Take 40 mg am and 20 mg pm 90 tablet 3 12/29/2023   glipiZIDE (GLUCOTROL XL) 5 MG 24 hr tablet Take 5 mg by mouth in the morning and at bedtime.   12/29/2023   isosorbide mononitrate (IMDUR) 30 MG 24 hr tablet Take 1 tablet by mouth once daily (Patient taking differently: Take 30 mg by mouth daily.) 90 tablet 3 12/29/2023   MEGARED OMEGA-3 KRILL OIL PO Take 1,000 mg by mouth daily. When she remembers   12/29/2023   nitroGLYCERIN (NITROSTAT) 0.4 MG SL tablet Place 1 tablet (0.4 mg total) under the tongue every 5 (five) minutes x 3 doses as needed for chest pain. 25 tablet 3 Unknown   pioglitazone (ACTOS) 30 MG tablet Take 30 mg by  mouth daily.   12/29/2023   Polyethyl Glycol-Propyl Glycol (SYSTANE OP) Apply 1 drop to eye 3 (three) times daily as needed (dry eyes).   Past Month   vitamin E 180 MG (400 UNITS) capsule Take 400 Units by mouth daily.   12/29/2023   warfarin (COUMADIN) 5 MG tablet Take 5 mg by mouth every evening. Except 2.5 mg on Sunday & Wednesday   12/28/2023    Assessment: Pharmacy consulted to dose warfarin in patient with atrial fibrillation and history of PE.  INR on admission elevated at 10.3 and given Vitamin K 5 mg PO.  INR today down to 1.4.  Home dose listed as 2.5 mg on Sun + Wed and 5 mg ROW from August 2024 anti-coag visit. Med rec currently still pending but will start at lower dose until can determine.  IV heparin now during transition back to warfarin or DOAC.  3/21 AM update:  Heparin level supra-therapeutic   Goal of Therapy:  Heparin level 0.3-0.7 units/mL INR 2-3 Monitor platelets by anticoagulation protocol: Yes   Plan:  Warfarin held due to being NPO Dec heparin to 1000 units/hr 8 hour heparin level  Abran Duke, PharmD, BCPS Clinical Pharmacist Phone: [phone]

## 2024-01-13 NOTE — Progress Notes (Signed)
 Progress Note  Patient admitted due to acute respiratory failure with hypoxia hypercarbia due to acute on chronic HFimpEF went into respiratory distress and was placed on BiPAP yesterday due to increased shortness of breath. RN notified that patient family wants to change patient's status to comfort care.  This was confirmed with the family.  Comfort care orders placed.

## 2024-01-13 NOTE — Progress Notes (Addendum)
 PROGRESS NOTE  GLORIANNA GOTT ZOX:[id number] DOB: Jul 19, 1941 DOA: 12/29/2023 PCP: Roe [name], NP  Brief History:  83 y.o. female with medical history significant for diabetes mellitus type 2, hypertension, hyperlipidemia,, permanent atrial flutter status post ablation September 2014 , bilateral PE on Coumadin, stage III chronic kidney disease, coronary artery disease status post DES to the RCA 07/2020 , essential hypertension, HFrEF, vascular dementia and CVA, dyslipidemia and ischemic and myopathy, who presented to the ER with acute onset of worsening dyspnea with associated orthopnea without paroxysmal nocturnal dyspnea.  She has been having dyspnea on exertion.  No fever or chills.  No nausea or vomiting or abdominal pain.  She noted neck erythematous rash without itching.  No dysuria, oliguria or hematuria or flank pain.  Pt was admitted with acute heart failure and recurrent hypoglycemia from glipizide in the setting of chronic kidney disease.   Notably, the patient had an office visit with cardiology on 11/29/2023.  Her office weight at that time was 185 pounds The patient was started on IV furosemide with good diuresis.  Unfortunately, her respiratory status was slow to improve.  She continued alternate on and off BiPAP.  Her carvedilol was initially held secondary to soft blood pressures.  As result, her atrial fibrillation rate increased.  Cardiology was consulted to assist with management. On 01/01/2024, the patient had worsening shortness of breath.  She was placed on BiPAP.  Cardiology was consulted to assist with management.  Her furosemide dosing was escalated.  Unfortunately, the patient continued to have respiratory distress.  Amiodarone drip was started for her atrial fibrillation with RVR.  Unfortunately, the patient continued to have increased work of breathing.  Urine output was diminished despite escalating doses of furosemide.  Goals of care discussion was held with the  patient's family.  Initially, they want the patient to be full code to allow for other out-of-state family to arrive to visit with the patient. Upon the patient's full family arrival at the bedside, further GOC was discussed.  The patient's family wish to transition the patient to focus on comfort measures care.  As such, all other blood work and radiographic studies were discontinued.  Comfort measures medications were initiated.   Assessment/Plan: Acute respiratory failure with hypoxia and hypercarbia -Secondary to CHF -Initially required BiPAP -weaned to 2L, but back to BiPAP for increase WOB on 3/20 -Wean oxygen as tolerated -12/31/2023 VBG 7.4 2/56/47/36 -Obtain CT chest--small bilateral pleural effusions; lobular interstitial accentuation with patchy GGO, favor edema, LUL GG nodule -COVID/RSV/Flu--neg -check viral resp panel--neg -repeat CXR -PCT <0.10 -01/01/24--patient continued to have increased work of breathing despite being on BiPAP. -Doses of furosemide were escalated. -Amiodarone drip was initiated for atrial fibrillation with RVR. -The patient continued to have increased work of breathing. -After extensive goals of care discussions, the patient's care was ultimately transition to focus on comfort measures   Acute on chronic HFimpEF -05/13/2023 echo EF 55 to 60%, mild MR, no WMA, normal RVF -07/14/2020 echo EF 45 to 50%, G2DD -Repeat echo--results pending -Continue IV furosemide 40 mg bid -I's and O's incomplete -12/30/23 ReDS = 32 -12/31/23 ReDS = 33 -Now transition to comfort measures care   Supratherapeutic INR -Patient given vitamin K on 12/30/2023 -Monitor INR>>down to 1.4>>restart warfarin -No active signs of bleeding presently -Monitor hemoglobin--stable   Permanent atrial flutter -HR now up b/c coreg was held due to soft BPs -Hold carvedilol secondary to soft blood pressures -Holding  warfarin temporarily secondary to supratherapeutic INR -Status post ablation  September 2014 -Initiated on amiodarone on 01/01/2024 -Now transitioned to comfort measures   LUE edema -venous duplex--negative   Bronchospasm -was Start DuoNebs -was Start Solu-Medrol -was [employer] -Now transition to comfort measures care   Coronary artery disease with hx NSTEMI -No chest pain presently -DES to RCA 07/2020 -Continue Plavix -Continue Lipitor -Continue carvedilol   Diabetes mellitus type 2 with hypoglycemia -Patient has continued to be hypoglycemic>>improved now -Check cortisol--15.3 -Holding glipizide and Actos 12/30/23 A1C--8.3   History of PE -holding warfarin temporarily as discussed -now with INR 1.4>>restart warfarin   Hyperlipidemia -Continue statin   Cognitive impairment -pt able to perform ADLs with minimal assistance and uses walker at baseline  Goals of care -Transition to comfort measures after discussion with the patient's family -Comfort measures care initiated including morphine IV, Robinul, Ativan -Increase morphine drip to 2 mg/h -Increase morphine boluses prn agitation          Family Communication:   Family at bedside 3/21  Consultants:  comfort measures  Code Status:  FULL COMFORT  DVT Prophylaxis:  Full Comfort   Procedures: As Listed in Progress Note Above  Antibiotics: None        Subjective:  Patient is resting comfortably.  No distress.  No uncontrolled pain.  He has intermittent episodes of agitation. Objective: Vitals:    0315  0330  0345  0400  BP:  (!) 85/29    Pulse: 92 86 98 (!) 106  Resp: 13 14 (!) 27 (!) 27  Temp:      TempSrc:      SpO2: 100% 100% 93% 96%  Weight:      Height:        Intake/Output Summary (Last 24 hours) at  0920 Last data filed at  0524 Gross per 24 hour  Intake 507.09 ml  Output 700 ml  Net -192.91 ml   Weight change:  Exam:  General:  Pt is somnolent and peaceful.  No distress. HEENT: No icterus,  No thrush, No neck mass, [place]/AT Cardiovascular: IRRR, S1/S2, no rubs, no gallops Respiratory: Bilateral rales.  No wheezing Abdomen: Soft/+BS, non tender, non distended, no guarding Extremities: trace LE edema, No lymphangitis, No petechiae, No rashes, no synovitis   Data Reviewed: I have personally reviewed following labs and imaging studies Basic Metabolic Panel: Recent Labs  Lab 12/29/23 1645 12/30/23 0425 12/31/23 0453 01/01/24 0435  0025  NA 139 142 142 144 139  K 4.4 4.1 4.0 4.1 4.6  CL 98 103 101 101 98  CO2 31 32 31 31 28   GLUCOSE 223* 135* 71 191* 390*  BUN 93* 93* 92* 91* 103*  CREATININE 2.67* 2.44* 2.63* 2.45* 2.93*  CALCIUM 9.4 8.7* 8.5* 8.9 8.8*  MG  --   --  2.5* 2.6* 2.7*   Liver Function Tests: Recent Labs  Lab 12/29/23 1645  0025  AST 22 27  ALT 24 23  ALKPHOS 56 54  BILITOT 1.3* 1.2  PROT 6.8 6.6  ALBUMIN 3.4* 3.1*   No results for input(s): "LIPASE", "AMYLASE" in the last 168 hours. No results for input(s): "AMMONIA" in the last 168 hours. Coagulation Profile: Recent Labs  Lab 12/29/23 1645 12/30/23 0425 12/31/23 0453 01/01/24 0435  0025  INR 7.1* 10.3* 3.9* 1.4* 1.3*   CBC: Recent Labs  Lab 12/29/23 1645 12/30/23 0425 12/31/23 0453 01/01/24 0435  0025  WBC 4.5 3.9* 4.5 5.1 5.6  NEUTROABS 3.3  --   --   --   --  HGB 10.2* 8.8* 9.0* 9.4* 9.9*  HCT 32.5* 29.5* 29.6* 29.7* 31.9*  MCV 99.7 99.7 99.3 98.3 100.0  PLT 230 209 237 273 260   Cardiac Enzymes: No results for input(s): "CKTOTAL", "CKMB", "CKMBINDEX", "TROPONINI" in the last 168 hours. BNP: Invalid input(s): "POCBNP" CBG: Recent Labs  Lab 01/01/24 1132 01/01/24 1646 01/01/24 2016 01/01/24 2341  0753  GLUCAP 223* 360* 382* 363* 401*   HbA1C: No results for input(s): "HGBA1C" in the last 72 hours. Urine analysis:    Component Value Date/Time   COLORURINE YELLOW 12/29/2023 1649   APPEARANCEUR CLEAR 12/29/2023 1649    LABSPEC 1.010 12/29/2023 1649   PHURINE 5.0 12/29/2023 1649   GLUCOSEU NEGATIVE 12/29/2023 1649   HGBUR SMALL (A) 12/29/2023 1649   BILIRUBINUR NEGATIVE 12/29/2023 1649   KETONESUR NEGATIVE 12/29/2023 1649   PROTEINUR NEGATIVE 12/29/2023 1649   NITRITE NEGATIVE 12/29/2023 1649   LEUKOCYTESUR SMALL (A) 12/29/2023 1649   Sepsis Labs: @LABRCNTIP (procalcitonin:4,lacticidven:4) ) Recent Results (from the past 240 hours)  Urine Culture     Status: Abnormal   Collection Time: 12/29/23  4:49 PM   Specimen: Urine, Random  Result Value Ref Range Status   Specimen Description   Final    URINE, RANDOM Performed at [facility], [address], [place], [place] [address]    Special Requests   Final    NONE Reflexed from [id number] Performed at [facility], [address] [place], [place] [address]    Culture (A)  Final    <10,000 COLONIES/mL INSIGNIFICANT GROWTH Performed at [facility], [address], [place], [place] [address]    Report Status 12/31/2023 FINAL  Final  Resp panel by RT-PCR (RSV, Flu A&B, Covid) Anterior Nasal Swab     Status: None   Collection Time: 12/29/23  5:06 PM   Specimen: Anterior Nasal Swab  Result Value Ref Range Status   SARS Coronavirus 2 by RT PCR NEGATIVE NEGATIVE Final    Comment: (NOTE) SARS-CoV-2 target nucleic acids are NOT DETECTED.  The SARS-CoV-2 RNA is generally detectable in upper respiratory specimens during the acute phase of infection. The lowest concentration of SARS-CoV-2 viral copies this assay can detect is 138 copies/mL. A negative result does not preclude SARS-Cov-2 infection and should not be used as the sole basis for treatment or other patient management decisions. A negative result may occur with  improper specimen collection/handling, submission of specimen other than nasopharyngeal swab, presence of viral mutation(s) within the areas targeted by this assay, and inadequate number of viral copies(<138 copies/mL). A negative  result must be combined with clinical observations, patient history, and epidemiological information. The expected result is Negative.  Fact Sheet for Patients:  BloggerCourse.com  Fact Sheet for Healthcare Providers:  SeriousBroker.it  This test is no t yet approved or cleared by the [place] FDA and  has been authorized for detection and/or diagnosis of SARS-CoV-2 by FDA under an Emergency Use Authorization (EUA). This EUA will remain  in effect (meaning this test can be used) for the duration of the COVID-19 declaration under Section 564(b)(1) of the Act, 21 U.S.C.section 360bbb-3(b)(1), unless the authorization is terminated  or revoked sooner.       Influenza A by PCR NEGATIVE NEGATIVE Final   Influenza B by PCR NEGATIVE NEGATIVE Final    Comment: (NOTE) The Xpert Xpress SARS-CoV-2/FLU/RSV plus assay is intended as an aid in the diagnosis of influenza from Nasopharyngeal swab specimens and should not be used as a sole basis  for treatment. Nasal washings and aspirates are unacceptable for Xpert Xpress SARS-CoV-2/FLU/RSV testing.  Fact Sheet for Patients: BloggerCourse.com  Fact Sheet for Healthcare Providers: SeriousBroker.it  This test is not yet approved or cleared by the [place] FDA and has been authorized for detection and/or diagnosis of SARS-CoV-2 by FDA under an Emergency Use Authorization (EUA). This EUA will remain in effect (meaning this test can be used) for the duration of the COVID-19 declaration under Section 564(b)(1) of the Act, 21 U.S.C. section 360bbb-3(b)(1), unless the authorization is terminated or revoked.     Resp Syncytial Virus by PCR NEGATIVE NEGATIVE Final    Comment: (NOTE) Fact Sheet for Patients: BloggerCourse.com  Fact Sheet for Healthcare Providers: SeriousBroker.it  This  test is not yet approved or cleared by the [place] FDA and has been authorized for detection and/or diagnosis of SARS-CoV-2 by FDA under an Emergency Use Authorization (EUA). This EUA will remain in effect (meaning this test can be used) for the duration of the COVID-19 declaration under Section 564(b)(1) of the Act, 21 U.S.C. section 360bbb-3(b)(1), unless the authorization is terminated or revoked.  Performed at [facility], [address] [place], [place] [address]   Blood Culture (routine x 2)     Status: None (Preliminary result)   Collection Time: 12/29/23  5:26 PM   Specimen: BLOOD  Result Value Ref Range Status   Specimen Description BLOOD BLOOD LEFT FOREARM  Final   Special Requests   Final    BOTTLES DRAWN AEROBIC ONLY Blood Culture results may not be optimal due to an inadequate volume of blood received in culture bottles   Culture   Final    NO GROWTH 4 DAYS Performed at [facility], [address] [place], [place] [address]    Report Status PENDING  Incomplete  Blood Culture (routine x 2)     Status: None (Preliminary result)   Collection Time: 12/29/23  5:26 PM   Specimen: BLOOD  Result Value Ref Range Status   Specimen Description BLOOD BLOOD RIGHT FOREARM  Final   Special Requests   Final    BOTTLES DRAWN AEROBIC AND ANAEROBIC Blood Culture results may not be optimal due to an inadequate volume of blood received in culture bottles   Culture   Final    NO GROWTH 4 DAYS Performed at [facility], [address], [place], [place] [address]    Report Status PENDING  Incomplete  MRSA Next Gen by PCR, Nasal     Status: None   Collection Time: 12/30/23  2:18 AM   Specimen: Nasal Mucosa; Nasal Swab  Result Value Ref Range Status   MRSA by PCR Next Gen NOT DETECTED NOT DETECTED Final    Comment: (NOTE) The GeneXpert MRSA Assay (FDA approved for NASAL specimens only), is one component of a comprehensive MRSA colonization surveillance program. It is not intended  to diagnose MRSA infection nor to guide or monitor treatment for MRSA infections. Test performance is not FDA approved in patients less than 55 years old. Performed at [facility], [address] [place], [place] [address]   Respiratory (~20 pathogens) panel by PCR     Status: None   Collection Time: 12/31/23 12:35 PM   Specimen: Nasopharyngeal Swab; Respiratory  Result Value Ref Range Status   Adenovirus NOT DETECTED NOT DETECTED Final   Coronavirus 229E NOT DETECTED NOT DETECTED Final    Comment: (NOTE) The Coronavirus on the Respiratory Panel, DOES NOT test for the novel  Coronavirus (  2019 nCoV)    Coronavirus HKU1 NOT DETECTED NOT DETECTED Final   Coronavirus NL63 NOT DETECTED NOT DETECTED Final   Coronavirus OC43 NOT DETECTED NOT DETECTED Final   Metapneumovirus NOT DETECTED NOT DETECTED Final   Rhinovirus / Enterovirus NOT DETECTED NOT DETECTED Final   Influenza A NOT DETECTED NOT DETECTED Final   Influenza B NOT DETECTED NOT DETECTED Final   Parainfluenza Virus 1 NOT DETECTED NOT DETECTED Final   Parainfluenza Virus 2 NOT DETECTED NOT DETECTED Final   Parainfluenza Virus 3 NOT DETECTED NOT DETECTED Final   Parainfluenza Virus 4 NOT DETECTED NOT DETECTED Final   Respiratory Syncytial Virus NOT DETECTED NOT DETECTED Final   Bordetella pertussis NOT DETECTED NOT DETECTED Final   Bordetella Parapertussis NOT DETECTED NOT DETECTED Final   Chlamydophila pneumoniae NOT DETECTED NOT DETECTED Final   Mycoplasma pneumoniae NOT DETECTED NOT DETECTED Final    Comment: Performed at [facility], [address], [place], [place] [address]  Culture, blood (Routine X 2) w Reflex to ID Panel     Status: None (Preliminary result)   Collection Time: 01/01/24  7:48 PM   Specimen: Right Antecubital; Blood  Result Value Ref Range Status   Specimen Description   Final    RIGHT ANTECUBITAL BOTTLES DRAWN AEROBIC AND ANAEROBIC   Special Requests   Final    Blood Culture results may not be  optimal due to an inadequate volume of blood received in culture bottles   Culture   Final    NO GROWTH < 12 HOURS Performed at [facility], [address], [place], [place] [address]    Report Status PENDING  Incomplete  Culture, blood (Routine X 2) w Reflex to ID Panel     Status: None (Preliminary result)   Collection Time: 01/01/24  7:48 PM   Specimen: BLOOD RIGHT FOREARM  Result Value Ref Range Status   Specimen Description   Final    BLOOD RIGHT FOREARM BOTTLES DRAWN AEROBIC AND ANAEROBIC   Special Requests   Final    Blood Culture results may not be optimal due to an inadequate volume of blood received in culture bottles   Culture   Final    NO GROWTH < 12 HOURS Performed at [facility], [address], [place], [place] [address]    Report Status PENDING  Incomplete     Scheduled Meds:  Chlorhexidine Gluconate Cloth  6 each Topical Daily   Continuous Infusions:  morphine 1 mg/hr ( 0524)    Procedures/Studies: DG CHEST PORT 1 VIEW Result Date: 01/01/2024 CLINICAL DATA:  Insertion of tunneled central venous catheter with port. EXAM: PORTABLE CHEST 1 VIEW COMPARISON:  Chest radiograph earlier today FINDINGS: Tip of the right internal jugular central venous catheter overlies the lower SVC. No pneumothorax. Cardiomegaly is unchanged. Worsening pulmonary edema. Retrocardiac opacity, ill-defined right lung base opacity and small effusions. IMPRESSION: 1. Tip of the right internal jugular central venous catheter overlies the lower SVC. No pneumothorax. 2. Worsening pulmonary edema. 3. Unchanged cardiomegaly and pleural effusions. Electronically Signed   By: Narda [name] M.D.   On: 01/01/2024 17:22   DG Chest Port 1 View Result Date: 01/01/2024 CLINICAL DATA:  Atrial flutter. Status post ablation. On Coumadin. Previous PE EXAM: PORTABLE CHEST 1 VIEW COMPARISON:  X-ray 01/01/2024 earlier and older FINDINGS: Stable enlarged cardiopericardial silhouette calcified aorta.  Vascular congestion and some edema. Tiny effusions. Left retrocardiac opacity also seen. No pneumothorax. Overlapping cardiac leads. Degenerative changes along the  spine. Calcifications along the tracheobronchial tree. IMPRESSION: No significant oval change. Enlarged heart with congestion and edema. Lung base opacities with effusions. Electronically Signed   By: Karen Kays M.D.   On: 01/01/2024 16:05   US Venous Img Upper Uni Left (DVT) Result Date: 01/01/2024 CLINICAL DATA:  Left upper arm edema EXAM: LEFT UPPER EXTREMITY VENOUS DOPPLER ULTRASOUND TECHNIQUE: Gray-scale sonography with graded compression, as well as color Doppler and duplex ultrasound were performed to evaluate the upper extremity deep venous system from the level of the subclavian vein and including the jugular, axillary, basilic, radial, ulnar and upper cephalic vein. Spectral Doppler was utilized to evaluate flow at rest and with distal augmentation maneuvers. COMPARISON:  03/19/2023 FINDINGS: Contralateral Subclavian Vein: Respiratory phasicity is normal and symmetric with the symptomatic side. No evidence of thrombus. Normal compressibility. Internal Jugular Vein: No evidence of thrombus. Normal compressibility, respiratory phasicity and response to augmentation. Subclavian Vein: No evidence of thrombus. Normal compressibility, respiratory phasicity and response to augmentation. Axillary Vein: No evidence of thrombus. Normal compressibility, respiratory phasicity and response to augmentation. Cephalic Vein: No evidence of thrombus. Normal compressibility, respiratory phasicity and response to augmentation. Basilic Vein: No evidence of thrombus. Normal compressibility, respiratory phasicity and response to augmentation. Brachial Veins: No evidence of thrombus. Normal compressibility, respiratory phasicity and response to augmentation. Radial Veins: No evidence of thrombus. Normal compressibility, respiratory phasicity and response to  augmentation. Ulnar Veins: No evidence of thrombus. Normal compressibility, respiratory phasicity and response to augmentation. Venous Reflux:  None visualized. Other Findings:  None visualized. IMPRESSION: 1. No evidence of deep venous thrombosis within the left upper extremity. Electronically Signed   By: Sharlet [place] M.D.   On: 01/01/2024 15:04   ECHOCARDIOGRAM COMPLETE Result Date: 01/01/2024    ECHOCARDIOGRAM REPORT   Patient Name:   [name] Date of Exam: 01/01/2024 Medical Rec #:  [id number]        Height:       65.0 in Accession #:    [id number]       Weight:       200.2 lb Date of Birth:  07-Apr-1941        BSA:          1.979 m Patient Age:    82 years         BP:           117/74 mmHg Patient Gender: F                HR:           108 bpm. [facility] Procedure: 2D Echo, Cardiac Doppler and Color Doppler (Both Spectral and Color            Flow Doppler were utilized during procedure). Indications:     CHF-acute diastolic  History:         Patient has prior history of Echocardiogram examinations, most                  recent 05/13/2023. Cardiomyopathy and CHF, Previous Myocardial                  Infarction and CAD, Stroke and TIA, Arrythmias:Atrial Flutter,                  Signs/Symptoms:Chest Pain; Risk Factors:Hypertension, Diabetes                  and Dyslipidemia. CKD.  Sonographer:     Dorann Lodge  Clelia Croft Referring Phys:  [id number] Cleora Fleet Diagnosing Phys: Nona Dell MD IMPRESSIONS  1. Left ventricular ejection fraction, by estimation, is 45 to 50%. The left ventricle has mildly decreased function. The left ventricle has no regional wall motion abnormalities. There is mild concentric left ventricular hypertrophy. Left ventricular diastolic parameters are indeterminate.  2. Right ventricular systolic function is low normal. The right ventricular size is normal. There is moderately elevated pulmonary artery systolic pressure. The estimated right ventricular systolic  pressure is 48.4 mmHg.  3. Left atrial size was severely dilated.  4. Left pleural effusion evident. Small to moderate pericardial effusion. The pericardial effusion is posterior to the left ventricle.  5. The mitral valve is degenerative. Mild mitral valve regurgitation. Moderate mitral annular calcification.  6. The aortic valve is tricuspid. There is moderate calcification of the aortic valve. Aortic valve regurgitation is not visualized. Moderate aortic valve stenosis, low flow. Aortic valve mean gradient measures 11.4 mmHg. Dimentionless index 0.34.  7. The inferior vena cava is normal in size with <50% respiratory variability, suggesting right atrial pressure of 8 mmHg. Comparison(s): Prior images reviewed side by side. LVEF 45-50% range in the setting of atrial fibrillation with RVR. FINDINGS  Left Ventricle: Left ventricular ejection fraction, by estimation, is 45 to 50%. The left ventricle has mildly decreased function. The left ventricle has no regional wall motion abnormalities. The left ventricular internal cavity size was normal in size. There is mild concentric left ventricular hypertrophy. Left ventricular diastolic function could not be evaluated due to atrial fibrillation. Left ventricular diastolic parameters are indeterminate. Right Ventricle: The right ventricular size is normal. Right vetricular wall thickness was not well visualized. Right ventricular systolic function is low normal. There is moderately elevated pulmonary artery systolic pressure. The tricuspid regurgitant velocity is 3.18 m/s, and with an assumed right atrial pressure of 8 mmHg, the estimated right ventricular systolic pressure is 48.4 mmHg. Left Atrium: Left atrial size was severely dilated. Right Atrium: Right atrial size was normal in size. Pericardium: Left pleural effusion evident. A moderately sized pericardial effusion is present. The pericardial effusion is posterior to the left ventricle. Mitral Valve: The mitral  valve is degenerative in appearance. There is mild thickening of the mitral valve leaflet(s). There is mild calcification of the mitral valve leaflet(s). Moderate mitral annular calcification. Mild mitral valve regurgitation. MV peak gradient, 13.5 mmHg. The mean mitral valve gradient is 6.0 mmHg. Tricuspid Valve: The tricuspid valve is grossly normal. Tricuspid valve regurgitation is mild. Aortic Valve: The aortic valve is tricuspid. There is moderate calcification of the aortic valve. There is mild aortic valve annular calcification. Aortic valve regurgitation is not visualized. Moderate aortic stenosis is present. Aortic valve mean gradient measures 11.4 mmHg. Aortic valve peak gradient measures 20.8 mmHg. Aortic valve area, by VTI measures 1.08 cm. Pulmonic Valve: The pulmonic valve was grossly normal. Pulmonic valve regurgitation is trivial. Aorta: The aortic root and ascending aorta are structurally normal, with no evidence of dilitation. Venous: The inferior vena cava is normal in size with less than 50% respiratory variability, suggesting right atrial pressure of 8 mmHg. IAS/Shunts: No atrial level shunt detected by color flow Doppler. Additional Comments: 3D was performed not requiring image post processing on an independent workstation and was indeterminate.  LEFT VENTRICLE PLAX 2D LVIDd:         4.80 cm LVIDs:         3.40 cm LV PW:         1.30 cm  LV IVS:        1.00 cm LVOT diam:     2.00 cm LV SV:         44 LV SV Index:   22 LVOT Area:     3.14 cm  LV Volumes (MOD) LV vol d, MOD A2C: 113.0 ml LV vol d, MOD A4C: 177.0 ml LV vol s, MOD A2C: 41.5 ml LV vol s, MOD A4C: 64.0 ml LV SV MOD A2C:     71.5 ml LV SV MOD A4C:     177.0 ml LV SV MOD BP:      102.2 ml RIGHT VENTRICLE             IVC RV Basal diam:  4.10 cm     IVC diam: 1.80 cm RV Mid diam:    2.80 cm RV S prime:     14.50 cm/s TAPSE (M-mode): 1.4 cm LEFT ATRIUM              Index        RIGHT ATRIUM           Index LA diam:        5.20 cm  2.63  cm/m   RA Area:     15.70 cm LA Vol (A2C):   123.0 ml 62.16 ml/m  RA Volume:   35.60 ml  17.99 ml/m LA Vol (A4C):   139.0 ml 70.24 ml/m LA Biplane Vol: 139.0 ml 70.24 ml/m  AORTIC VALVE                     PULMONIC VALVE AV Area (Vmax):    1.16 cm      PV Vmax:       1.10 m/s AV Area (Vmean):   1.08 cm      PV Peak grad:  4.9 mmHg AV Area (VTI):     1.08 cm AV Vmax:           228.20 cm/s AV Vmean:          155.800 cm/s AV VTI:            0.408 m AV Peak Grad:      20.8 mmHg AV Mean Grad:      11.4 mmHg LVOT Vmax:         84.00 cm/s LVOT Vmean:        53.750 cm/s LVOT VTI:          0.140 m LVOT/AV VTI ratio: 0.34  AORTA Ao Root diam: 3.30 cm Ao Asc diam:  2.90 cm MITRAL VALVE                TRICUSPID VALVE MV Area (PHT): 6.54 cm     TR Peak grad:   40.4 mmHg MV Area VTI:   1.77 cm     TR Vmax:        318.00 cm/s MV Peak grad:  13.5 mmHg MV Mean grad:  6.0 mmHg     SHUNTS MV Vmax:       1.84 m/s     Systemic VTI:  0.14 m MV Vmean:      110.0 cm/s   Systemic Diam: 2.00 cm MV Decel Time: 116 msec MR Peak grad: 76.4 mmHg MR Mean grad: 51.0 mmHg MR Vmax:      437.00 cm/s MR Vmean:     337.0 cm/s MV E velocity: 198.00 cm/s Nona Dell MD Electronically signed by Nona Dell MD Signature Date/Time: 01/01/2024/1:57:36  PM    Final (Updated)    DG CHEST PORT 1 VIEW Result Date: 01/01/2024 CLINICAL DATA:  Respiratory distress. EXAM: PORTABLE CHEST 1 VIEW COMPARISON:  December 31, 2023. FINDINGS: Stable cardiomegaly. Increased bibasilar atelectasis or edema is noted with associated pleural effusions. Bony thorax is unremarkable. IMPRESSION: Increased bibasilar opacities as noted above. Electronically Signed   By: Lupita Raider M.D.   On: 01/01/2024 11:02   CT CHEST WO CONTRAST Result Date: 12/31/2023 CLINICAL DATA:  Respiratory illness, acute respiratory failure with hypoxia and hypercapnia. EXAM: CT CHEST WITHOUT CONTRAST TECHNIQUE: Multidetector CT imaging of the chest was performed following the  standard protocol without IV contrast. RADIATION DOSE REDUCTION: This exam was performed according to the departmental dose-optimization program which includes automated exposure control, adjustment of the mA and/or kV according to patient size and/or use of iterative reconstruction technique. COMPARISON:  Radiograph 12/31/2023 and CT chest 11/10/2020 FINDINGS: Cardiovascular: Dense atheromatous vascular calcification of the thoracic aorta, branch vessels, and coronary arteries. Dense mitral valve and moderate aortic valve calcifications. Small to moderate inferior pericardial effusion. Moderate cardiomegaly. Mediastinum/Nodes: Borderline enlarged 1.0 cm right lower paratracheal lymph node on image 53 series 2, previously 0.8 cm. Suspected mildly enlarged hilar lymph nodes. Low-level edema in the mediastinal adipose tissues and diffusely in the subcutaneous tissues. Lungs/Pleura: Small bilateral pleural effusions. Associated passive atelectasis in addition to disproportionate atelectasis in the left lower lobe and inferior lingula. Secondary pulmonary lobular interstitial accentuation with patchy ground-glass opacities in the lungs favoring pulmonary edema, less likely from atypical pneumonia or acute hypersensitivity pneumonitis. Hazy 0.8 cm ground-glass density nodule in the apicoposterior segment left upper lobe image 25 series 3 is stable from 11/10/2020 but other part solid and subsolid nodules from that time have generally resolved. Upper Abdomen: Left kidney upper pole cysts as previously characterized. Dense atheromatous vascular calcification. Musculoskeletal: Degenerative glenohumeral arthropathy bilaterally. Mild upper thoracic kyphosis and thoracic spondylosis. IMPRESSION: 1. Small bilateral pleural effusions with associated passive atelectasis in addition to disproportionate atelectasis in the left lower lobe and inferior lingula. 2. Secondary pulmonary lobular interstitial accentuation with patchy  ground-glass opacities in the lungs favoring pulmonary edema, less likely from atypical pneumonia or acute hypersensitivity pneumonitis. 3. Moderate cardiomegaly. Small to moderate inferior pericardial effusion. 4. Dense atheromatous vascular calcification of the thoracic aorta, branch vessels, and coronary arteries. 5. Low-grade diffuse subcutaneous and mediastinal edema, probably from third spacing of fluid, correlate with recent weight changes. 6. Dense mitral valve and moderate aortic valve calcifications. Correlate clinically in assessing for signs/symptoms of mitral valve dysfunction. 7. Hazy 0.8 by 0.6 cm ground-glass density nodule in the apicoposterior segment left upper lobe is stable from 11/10/2020 but other part solid and subsolid nodules from that time have generally resolved. Surveillance suggested with noncontrast chest CT in 12 months time. 8. Aortic Atherosclerosis (ICD10-I70.0). Electronically Signed   By: Gaylyn Rong M.D.   On: 12/31/2023 17:24   DG CHEST PORT 1 VIEW Result Date: 12/31/2023 CLINICAL DATA:  Heart failure EXAM: PORTABLE CHEST 1 VIEW COMPARISON:  One-view chest x-ray 12/29/2023. FINDINGS: Stable cardiomegaly is noted. Atherosclerotic changes are present at the aortic arch. Mild interstitial edema is now present. Retrocardiac opacification is present. No other focal airspace disease is present. Remote fracture of the proximal right humerus is again noted. IMPRESSION: 1. Cardiomegaly and mild interstitial edema compatible with congestive heart failure. 2. Retrocardiac opacification likely represents atelectasis. Electronically Signed   By: Marin Roberts M.D.   On: 12/31/2023 09:05  DG Chest Port 1 View Result Date: 12/29/2023 CLINICAL DATA:  Sepsis. EXAM: PORTABLE CHEST 1 VIEW COMPARISON:  Chest radiograph dated 10/10/2021. FINDINGS: No focal consolidation, pleural effusion, pneumothorax. Stable cardiomegaly. Atherosclerotic calcification of the aorta. Osteopenia  with degenerative changes of the spine. No acute osseous pathology. IMPRESSION: 1. No active disease. 2. Cardiomegaly. Electronically Signed   By: Elgie Collard M.D.   On: 12/29/2023 17:49    Catarina Hartshorn, DO  [facility]  If 7PM-7AM, please contact night-coverage [email or web address] Password [email or web address] , 9:20 AM   LOS: 4 days

## 2024-01-13 NOTE — Progress Notes (Signed)
 Throughout the course of the shift, Pt. Progressively becoming agitated and restless in bed fidgeting with cords and on continuous BIPAP. Pt. Tachypnic, increased WOB, and SOB. PRN meds given and MD notified. Family discussed with nurse that they wish for pt. To be a DNR, okay to intubate only until family gets in from [place]; once family arrives from [place], family wants pt. To be DNR/DNI since this is what pt. Would have wanted and previously having a DNR. MD notified. Family expressed to nurse that they all agree that since the family that they were waiting on to arrive from [place] to visit pt. Has arrived and visited that they would like to transition pt. To comfort care. Notified MD. MD came to bedside to have a discussion with the pt.'s family. Orders placed, orders implemented.

## 2024-01-13 NOTE — Progress Notes (Signed)
 Heart Failure Navigator Progress Note  Assessed for Heart & Vascular TOC clinic readiness.  Patient does not meet criteria per MD note  that family wanted change  patient status to comfort care and orders placed.  Navigator will sign off at this time.  Roxy Horseman, RN, BSN [facility] Heart Failure Navigator Secure Chat Only

## 2024-01-13 NOTE — Progress Notes (Signed)
   Progress Note  Patient Name: ARNESIA VINCELETTE Date of Encounter:   Primary Cardiologist: Nona Dell, MD  Interval chart reviewed including follow-up by hospitalist team.  Ms. Donahoe has had progressive hypoxic respiratory failure despite supportive measures, not responding well to diuretics and with worsening renal function, creatinine up to 2.93.  After discussion yesterday with family, she is now DNR and has been transition to comfort care, on morphine.  I spoke with her family members present in the room this morning.  For questions or updates, please contact [facility] Please consult [email or web address] for contact info under   Signed, Nona Dell, MD  , 9:46 AM

## 2024-01-13 DEATH — deceased
